# Patient Record
Sex: Male | Born: 1945
Health system: Southern US, Community
[De-identification: ages and names within clinical notes are randomized; demographics above are authoritative.]

## PROBLEM LIST (undated history)

## (undated) DIAGNOSIS — I6529 Occlusion and stenosis of unspecified carotid artery: Secondary | ICD-10-CM

## (undated) DIAGNOSIS — G2581 Restless legs syndrome: Secondary | ICD-10-CM

## (undated) DIAGNOSIS — E038 Other specified hypothyroidism: Secondary | ICD-10-CM

## (undated) DIAGNOSIS — C801 Malignant (primary) neoplasm, unspecified: Secondary | ICD-10-CM

## (undated) DIAGNOSIS — I10 Essential (primary) hypertension: Secondary | ICD-10-CM

## (undated) DIAGNOSIS — R5383 Other fatigue: Secondary | ICD-10-CM

## (undated) DIAGNOSIS — E782 Mixed hyperlipidemia: Secondary | ICD-10-CM

## (undated) DIAGNOSIS — I639 Cerebral infarction, unspecified: Secondary | ICD-10-CM

## (undated) DIAGNOSIS — C329 Malignant neoplasm of larynx, unspecified: Secondary | ICD-10-CM

## (undated) DIAGNOSIS — G458 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: Secondary | ICD-10-CM

## (undated) HISTORY — DX: Other fatigue: R53.83

## (undated) HISTORY — DX: Other specified hypothyroidism: E03.8

## (undated) HISTORY — PX: TONSILECTOMY/ADENOIDECTOMY WITH MYRINGOTOMY: SHX6125

## (undated) HISTORY — DX: Malignant (primary) neoplasm, unspecified: C80.1

## (undated) HISTORY — DX: Occlusion and stenosis of unspecified carotid artery: I65.29

## (undated) HISTORY — DX: Cerebral infarction, unspecified: I63.9

## (undated) HISTORY — DX: Restless legs syndrome: G25.81

## (undated) HISTORY — PX: THROAT SURGERY: SHX803

## (undated) HISTORY — DX: Essential (primary) hypertension: I10

## (undated) HISTORY — DX: Mixed hyperlipidemia: E78.2

## (undated) HISTORY — DX: Malignant neoplasm of larynx, unspecified: C32.9

## (undated) HISTORY — DX: 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: G45.8

## (undated) HISTORY — PX: LYMPHADENECTOMY: SHX15

---

## 2008-01-23 ENCOUNTER — Ambulatory Visit (HOSPITAL_COMMUNITY): Admission: RE | Admit: 2008-01-23 | Discharge: 2008-01-23 | Payer: Self-pay | Admitting: Radiation Oncology

## 2011-04-27 LAB — HM COLONOSCOPY

## 2012-09-05 ENCOUNTER — Other Ambulatory Visit: Payer: Self-pay

## 2012-09-05 DIAGNOSIS — I70219 Atherosclerosis of native arteries of extremities with intermittent claudication, unspecified extremity: Secondary | ICD-10-CM

## 2012-10-02 ENCOUNTER — Encounter: Payer: Self-pay | Admitting: Vascular Surgery

## 2012-10-03 ENCOUNTER — Encounter: Payer: Self-pay | Admitting: Vascular Surgery

## 2012-10-03 ENCOUNTER — Ambulatory Visit (INDEPENDENT_AMBULATORY_CARE_PROVIDER_SITE_OTHER): Payer: Medicare HMO | Admitting: Vascular Surgery

## 2012-10-03 VITALS — BP 134/83 | HR 71 | Resp 18 | Ht 65.0 in | Wt 187.3 lb

## 2012-10-03 DIAGNOSIS — I70219 Atherosclerosis of native arteries of extremities with intermittent claudication, unspecified extremity: Secondary | ICD-10-CM | POA: Insufficient documentation

## 2012-10-03 HISTORY — DX: Atherosclerosis of native arteries of extremities with intermittent claudication, unspecified extremity: I70.219

## 2012-10-03 NOTE — Progress Notes (Signed)
[facility]ist of [place]  Patient name: James Richards MRN: [id number] DOB: Feb 17, 1945 Sex: male  REASON FOR CONSULT: right calf claudication.  HPI: James Richards is a 67 y.o. male who was referred by Dr. Marina Goodell with right lower extremity claudication. This down had sponsored a program to try to get every 1 walking a mile. Once he began this he began experiencing pain in his right calf which was brought on by ambulation and relieved with rest. Prior to this he had not experienced any symptoms but his activity was fairly limited. He denies any symptoms in the left lower extremity. He denies right thigh are hip claudication. There are no other aggravating or alleviating factors.  I have reviewed his records from the referring physician. He was admitted approximately a month ago with a TIA associated with some slurred speech and right arm clumsiness. 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 He states that he had a carotid duplex that did not show any significant findings although I do not have that report.   Past Medical History  Diagnosis Date  . Carotid artery occlusion     throat  . Stroke    He denies any history of diabetes, previous history of myocardial infarction or history of congestive heart failure.  Family History  Problem Relation Age of Onset  . Other Father     amputation  . Cancer Sister   . Hypertension Brother    He denies any history of premature cardiovascular disease.  SOCIAL HISTORY: History  Substance Use Topics  . Smoking status: Former Smoker    Types: Cigarettes    Quit date: 02/15/2007  . Smokeless tobacco: Former Neurosurgeon    Types: Chew  . Alcohol Use: No   No Known Allergies  Current Outpatient Prescriptions  Medication Sig Dispense Refill  . amLODipine (NORVASC) 2.5 MG tablet Take 1 tablet by mouth daily.      Marland Kitchen aspirin 81 MG tablet Take 81 mg by mouth daily.      Marland Kitchen levothyroxine (SYNTHROID,  LEVOTHROID) 112 MCG tablet Take 1 tablet by mouth daily.      . minocycline (MINOCIN,DYNACIN) 50 MG capsule Take 1 capsule by mouth daily.      . Omega-3 Fatty Acids (FISH OIL) 1200 MG CPDR Take 1 capsule by mouth daily.      Marland Kitchen omeprazole (PRILOSEC) 40 MG capsule Take 1 capsule by mouth daily.       No current facility-administered medications for this visit.   REVIEW OF SYSTEMS: Arly.Keller ] denotes positive finding; [  ] denotes negative finding  CARDIOVASCULAR:  [ ]  chest pain   [ ]  chest pressure   [ ]  palpitations   [ ]  orthopnea   [ ]  dyspnea on exertion   Arly.Keller ] claudication- right calf.   [ ]  rest pain   [ ]  DVT   [ ]  phlebitis PULMONARY:   [ ]  productive cough   [ ]  asthma   [ ]  wheezing NEUROLOGIC:   [ ]  weakness  [ ]  paresthesias  [ ]  aphasia  [ ]  amaurosis  [ ]  dizziness HEMATOLOGIC:   [ ]  bleeding problems   [ ]  clotting disorders MUSCULOSKELETAL:  [ ]  joint pain   [ ]  joint swelling Arly.Keller ] leg swelling GASTROINTESTINAL: [ ]   blood in stool  [ ]   hematemesis GENITOURINARY:  [ ]   dysuria  [ ]   hematuria PSYCHIATRIC:  [ ]   history of major depression INTEGUMENTARY:  [ ]  rashes  [ ]  ulcers CONSTITUTIONAL:  [ ]  fever   [ ]  chills  PHYSICAL EXAM: Filed Vitals:   10/03/12 1019  BP: 134/83  Pulse: 71  Resp: 18  Height: 5\' 5"  (1.651 m)  Weight: 187 lb 4.8 oz (84.959 kg)  SpO2: 100%   Body mass index is 31.17 kg/(m^2). GENERAL: The patient is a well-nourished male, in no acute distress. The vital signs are documented above. CARDIOVASCULAR: There is a regular rate and rhythm. I do not detect carotid bruits. He has palpable femoral and popliteal pulses bilaterally. I cannot palpate pedal pulses. Both feet are warm and well-perfused. He has no significant lower extremity swelling. PULMONARY: There is good air exchange bilaterally without wheezing or rales. ABDOMEN: Soft and non-tender with normal pitched bowel sounds.  MUSCULOSKELETAL: There are no major deformities or cyanosis. NEUROLOGIC:  No focal weakness or paresthesias are detected. SKIN: There are no ulcers or rashes noted. PSYCHIATRIC: The patient has a normal affect.  DATA:  I have reviewed his arterial Doppler study which was done at [facility]. Resting ABIs were 97% on the right and 100% on the left. His right ABI dropped to 53% after exercise.  MEDICAL ISSUES:  Atherosclerotic peripheral vascular disease with intermittent claudication This patient has stable right lower extremity calf claudication. Fortunately he is not a smoker. I've encouraged him to stay as active as possible. His primary care doctor follows his blood pressure and cholesterol closely. He is on aspirin. We've discussed the importance of getting I structured walking program. I'll see him back in one year with follow up ABIs which I have ordered. He knows to call sooner if he has problems. I've explained that we would only consider arteriography of his symptoms progressed and became disabling or he developed rest pain or nonhealing ulcer.   Kris No S [facility] of [place] Beeper: [phone]

## 2012-10-03 NOTE — Addendum Note (Signed)
Addended by: Adria Dill L on: 10/03/2012 03:49 PM   Modules accepted: Orders

## 2012-10-03 NOTE — Assessment & Plan Note (Signed)
This patient has stable right lower extremity calf claudication. Fortunately he is not a smoker. I've encouraged him to stay as active as possible. His primary care doctor follows his blood pressure and cholesterol closely. He is on aspirin. We've discussed the importance of getting I structured walking program. I'll see him back in one year with follow up ABIs which I have ordered. He knows to call sooner if he has problems. I've explained that we would only consider arteriography of his symptoms progressed and became disabling or he developed rest pain or nonhealing ulcer.

## 2012-10-04 ENCOUNTER — Encounter: Payer: Self-pay | Admitting: Legal Medicine

## 2012-12-20 ENCOUNTER — Other Ambulatory Visit: Payer: Self-pay

## 2013-04-04 ENCOUNTER — Other Ambulatory Visit: Payer: Self-pay | Admitting: Vascular Surgery

## 2013-04-04 DIAGNOSIS — I70219 Atherosclerosis of native arteries of extremities with intermittent claudication, unspecified extremity: Secondary | ICD-10-CM

## 2013-10-08 ENCOUNTER — Encounter: Payer: Self-pay | Admitting: Family

## 2013-10-09 ENCOUNTER — Encounter: Payer: Self-pay | Admitting: Family

## 2013-10-09 ENCOUNTER — Ambulatory Visit (HOSPITAL_COMMUNITY)
Admission: RE | Admit: 2013-10-09 | Discharge: 2013-10-09 | Disposition: A | Discharge status: Home / Self Care | Payer: Medicare HMO | Source: Ambulatory Visit | Attending: Family | Admitting: Family

## 2013-10-09 ENCOUNTER — Ambulatory Visit (INDEPENDENT_AMBULATORY_CARE_PROVIDER_SITE_OTHER): Payer: Commercial Managed Care - HMO | Admitting: Family

## 2013-10-09 VITALS — BP 131/91 | HR 65 | Resp 16 | Ht 65.0 in | Wt 188.0 lb

## 2013-10-09 DIAGNOSIS — I70219 Atherosclerosis of native arteries of extremities with intermittent claudication, unspecified extremity: Secondary | ICD-10-CM

## 2013-10-09 DIAGNOSIS — I10 Essential (primary) hypertension: Secondary | ICD-10-CM | POA: Insufficient documentation

## 2013-10-09 HISTORY — DX: Atherosclerosis of native arteries of extremities with intermittent claudication, unspecified extremity: I70.219

## 2013-10-09 NOTE — Progress Notes (Signed)
VASCULAR & VEIN SPECIALISTS OF [place] HISTORY AND PHYSICAL -PAD  History of Present Illness James Richards is a 68 y.o. male patient of Dr. Scot Dock with stable right lower extremity calf claudication. He returns today for ABI's and evaluation. After walking about 1/2 mile he develops right calf pain, resolves with rest, denies non healing wounds. He had a stroke at age 33, and 82 TIA's since then, 2 of which he had no symptoms. His stroke and TIA manifested as receptive and expressive aphasia. He had right hemiparesis for about a day. He denies any monocular loss of vision. He states that his stroke was a cerebral bleed.  He states he had a Duplex of his neck about 10 years ago at [facility] in [place], pt states results were normal, but that his left carotid pulse is difficult to find. Lymph nodes in neck removed in 2009 for tongue cancer, then he had radiation to the right side of his neck, also had chemotherapy. Pt has not had previous vascular intervention of legs . He has a hx of PE during the chemo and radiation treatment, treated with Lovenox for 6 months.  The patient denies New Medical or Surgical History.  Pt Diabetic: No Pt smoker: former smoker, quit in 2009  Pt meds include: Statin :No, states his cholesterol is OK. ASA: Yes Other anticoagulants/antiplatelets: no  Past Medical History  Diagnosis Date  . Carotid artery occlusion     throat  . Stroke     Social History History  Substance Use Topics  . Smoking status: Former Smoker    Types: Cigarettes    Quit date: 02/15/2007  . Smokeless tobacco: Former Systems developer    Types: Chew  . Alcohol Use: No    Family History Family History  Problem Relation Age of Onset  . Other Father     amputation  . Cancer Sister   . Hypertension Brother     Past Surgical History  Procedure Laterality Date  . Lymphadenectomy      No Known Allergies  Current Outpatient Prescriptions  Medication Sig Dispense Refill   . amLODipine (NORVASC) 2.5 MG tablet Take 1 tablet by mouth daily.      Marland Kitchen aspirin 81 MG tablet Take 81 mg by mouth daily.      Marland Kitchen levothyroxine (SYNTHROID, LEVOTHROID) 112 MCG tablet Take 1 tablet by mouth daily.      . minocycline (MINOCIN,DYNACIN) 50 MG capsule Take 1 capsule by mouth daily.      . Omega-3 Fatty Acids (FISH OIL) 1200 MG CPDR Take 1 capsule by mouth daily.      Marland Kitchen omeprazole (PRILOSEC) 40 MG capsule Take 1 capsule by mouth daily.       No current facility-administered medications for this visit.    ROS: See HPI for pertinent positives and negatives.   Physical Examination  Filed Vitals:   10/09/13 1123  BP: 131/91  Pulse: 65  Resp: 16  Height: 5\' 5"  (1.651 m)  Weight: 188 lb (85.276 kg)  SpO2: 99%   Body mass index is 31.28 kg/(m^2).  General: A&O x 3, WDWN, obese male. Gait: normal Eyes: PERRLA. Pulmonary: CTAB, without wheezes , rales or rhonchi. Cardiac: regular Rythm , without detected murmur.         Carotid Bruits Right Left   Negative Negative  Aorta is not palpable. Radial pulses: are 2+ palpable and =  VASCULAR EXAM: Extremities without ischemic changes  without Gangrene; without open wounds.                                                                                                          LE Pulses Right Left       FEMORAL  1+ palpable  2+ palpable        POPLITEAL  1+ palpable   not palpable       POSTERIOR TIBIAL  not palpable   1+ palpable        DORSALIS PEDIS      ANTERIOR TIBIAL not palpable  1+ palpable    Abdomen: soft, NT, no masses. Skin: no rashes, no ulcers noted. Musculoskeletal: no muscle wasting or atrophy.  Neurologic: A&O X 3; Appropriate Affect ; SENSATION: normal; MOTOR FUNCTION:  moving all extremities equally, motor strength 5/5 throughout. Speech is fluent/normal. CN 2-12 intact except for tongue deviation to the right.    Non-Invasive Vascular Imaging: DATE: 10/09/2013 ABI:   RIGHT 0.96, Waveforms: bi and monophasic;  LEFT 1.14, Waveforms: triphasic Normal TBI's   ASSESSMENT: James Richards is a 68 y.o. male who presents with mild right calf claudication. After walking about 1/2 mile he develops right calf pain, resolves with rest, denies non healing wounds. He has a history of hemorrhagic stroke and 3 TIA's, no carotid Duplex done for at least 10 years per pt, no results on file, will obtain on his return in 1 year. ABI's in both legs are normal.  PLAN:  I discussed in depth with the patient the nature of atherosclerosis, and emphasized the importance of maximal medical management including strict control of blood pressure, blood glucose, and lipid levels, obtaining regular exercise, and continued cessation of smoking.  The patient is aware that without maximal medical management the underlying atherosclerotic disease process will progress, limiting the benefit of any interventions.  Based on the patient's vascular studies and examination, pt will return to clinic in 1 year for ABI's and carotid Duplex.  The patient was given information about PAD including signs, symptoms, treatment, what symptoms should prompt the patient to seek immediate medical care, and risk reduction measures to take.  Clemon Chambers, RN, MSN, FNP-C [facility]ascular [facility] Vein Specialists of [facility] Phone: [phone]  Clinic MD: Scot Dock  10/09/2013 11:10 AM

## 2013-10-09 NOTE — Patient Instructions (Signed)

## 2014-04-02 DIAGNOSIS — G43909 Migraine, unspecified, not intractable, without status migrainosus: Secondary | ICD-10-CM

## 2014-04-02 HISTORY — DX: Migraine, unspecified, not intractable, without status migrainosus: G43.909

## 2014-10-15 ENCOUNTER — Ambulatory Visit: Payer: Commercial Managed Care - HMO | Admitting: Family

## 2014-10-15 ENCOUNTER — Encounter (HOSPITAL_COMMUNITY): Payer: Commercial Managed Care - HMO

## 2014-11-11 DIAGNOSIS — G451 Carotid artery syndrome (hemispheric): Secondary | ICD-10-CM

## 2014-11-11 HISTORY — DX: Carotid artery syndrome (hemispheric): G45.1

## 2014-11-17 ENCOUNTER — Encounter: Payer: Self-pay | Admitting: Vascular Surgery

## 2014-11-19 ENCOUNTER — Ambulatory Visit (HOSPITAL_COMMUNITY)
Admission: RE | Admit: 2014-11-19 | Discharge: 2014-11-19 | Disposition: A | Discharge status: Home / Self Care | Payer: Commercial Managed Care - HMO | Source: Ambulatory Visit | Attending: Vascular Surgery | Admitting: Vascular Surgery

## 2014-11-19 ENCOUNTER — Other Ambulatory Visit: Payer: Self-pay

## 2014-11-19 ENCOUNTER — Ambulatory Visit (INDEPENDENT_AMBULATORY_CARE_PROVIDER_SITE_OTHER): Payer: Commercial Managed Care - HMO | Admitting: Vascular Surgery

## 2014-11-19 ENCOUNTER — Encounter: Payer: Self-pay | Admitting: Vascular Surgery

## 2014-11-19 VITALS — BP 127/81 | HR 71 | Ht 65.0 in | Wt 195.3 lb

## 2014-11-19 DIAGNOSIS — Z48812 Encounter for surgical aftercare following surgery on the circulatory system: Secondary | ICD-10-CM

## 2014-11-19 DIAGNOSIS — I6522 Occlusion and stenosis of left carotid artery: Secondary | ICD-10-CM | POA: Diagnosis not present

## 2014-11-19 DIAGNOSIS — Z8673 Personal history of transient ischemic attack (TIA), and cerebral infarction without residual deficits: Secondary | ICD-10-CM | POA: Insufficient documentation

## 2014-11-19 DIAGNOSIS — I6523 Occlusion and stenosis of bilateral carotid arteries: Secondary | ICD-10-CM | POA: Insufficient documentation

## 2014-11-19 NOTE — Progress Notes (Signed)
Vascular and Vein Specialist of [place]  Patient name: James Richards MRN: [id number] DOB: 07/22/1945 Sex: male  REASON FOR CONSULT: evaluate left carotid stenosis  HPI: James Richards is a 69 y.o. male, who is right handed, and who was referred for evaluation of a left carotid stenosis. I have reviewed his records that were sent with him. 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 He had follow up visits in March, May, August, and most recently September.   In my history, he states that in January he was having problems with left arm tingling and weakness. He also admits to episodes of transient loss of vision in the left eye which would last only briefly. This is been going on since January. His most recent episode was one week ago. He also admits to some episodes of expressive aphasia. He had been on aspirin but this was recently discontinued as he was started on Eliquis. He is not sure why he is on Eliquis.  Of note he is on Eliquis. He is also on aspirin and is on a statin.  Past Medical History  Diagnosis Date  . Carotid artery occlusion     throat  . Stroke [facility]) 1987 , 2014  . Cancer [facility]) Jun 30, 2007    Throat , Tongue   Family History  Problem Relation Age of Onset  . Other Father     amputation  . Deep vein thrombosis Father   . Heart disease Father     Atrial Fib.  . Cancer Sister   . Hypertension Brother    SOCIAL HISTORY: Social History   Social History  . Marital Status: Divorced    Spouse Name: N/A  . Number of Children: N/A  . Years of Education: N/A   Occupational History  . Not on file.   Social History Main Topics  . Smoking status: Former Smoker    Types: Cigarettes    Quit date: 02/15/2007  . Smokeless tobacco: Former Systems developer    Types: Chew  . Alcohol Use: No  . Drug Use: No  . Sexual Activity: Not on file   Other Topics Concern  . Not on file   Social History  Narrative   No Known Allergies Current Outpatient Prescriptions  Medication Sig Dispense Refill  . amLODipine (NORVASC) 2.5 MG tablet Take 1 tablet by mouth daily.    Marland Kitchen apixaban (ELIQUIS) 5 MG TABS tablet Take 5 mg by mouth.    Marland Kitchen atorvastatin (LIPITOR) 80 MG tablet Take 80 mg by mouth.    . levothyroxine (SYNTHROID, LEVOTHROID) 112 MCG tablet Take 1 tablet by mouth daily.    . minocycline (MINOCIN,DYNACIN) 50 MG capsule Take 1 capsule by mouth daily.    . Omega-3 Fatty Acids (FISH OIL) 1200 MG CPDR Take 1 capsule by mouth daily.    Marland Kitchen omeprazole (PRILOSEC) 40 MG capsule Take 1 capsule by mouth daily.    Marland Kitchen aspirin 81 MG tablet Take 81 mg by mouth daily.     No current facility-administered medications for this visit.   REVIEW OF SYSTEMS:  [X]  denotes positive finding, [ ]  denotes negative finding Cardiac  Comments:  Chest pain or chest pressure:    Shortness of breath upon exertion: X   Short of breath when lying flat:    Irregular heart rhythm:        Vascular    Pain in calf, thigh, or  hip brought on by ambulation:    Pain in feet at night that wakes you up from your sleep:     Blood clot in your veins:    Leg swelling:  X       Pulmonary    Oxygen at home:    Productive cough:     Wheezing:         Neurologic    Sudden weakness in arms or legs:     Sudden numbness in arms or legs:     Sudden onset of difficulty speaking or slurred speech:    Temporary loss of vision in one eye:  X   Problems with dizziness:  X       Gastrointestinal    Blood in stool:     Vomited blood:         Genitourinary    Burning when urinating:     Blood in urine:        Psychiatric    Major depression:         Hematologic    Bleeding problems:    Problems with blood clotting too easily:        Skin    Rashes or ulcers:        Constitutional    Fever or chills:      PHYSICAL EXAM: Filed Vitals:   11/19/14 1341 11/19/14 1343  BP: 125/89 127/81  Pulse: 71   Height: 5\' 5"   (1.651 m)   Weight: 195 lb 4.8 oz (88.587 kg)   SpO2: 100%    GENERAL: The patient is a well-nourished male, in no acute distress. The vital signs are documented above. CARDIAC: There is a regular rate and rhythm.  VASCULAR: I do not detect carotid bruits. PULMONARY: There is good air exchange bilaterally without wheezing or rales. ABDOMEN: Soft and non-tender with normal pitched bowel sounds.  MUSCULOSKELETAL: There are no major deformities or cyanosis. NEUROLOGIC: No focal weakness or paresthesias are detected. SKIN: There are no ulcers or rashes noted. PSYCHIATRIC: The patient has a normal affect.  DATA:  I have reviewed the records that were sent with him from [place] ON 11/10/2014: This shows a 75-80% stenosis in the proximal left internal carotid artery. This stenosis subserves a vascular territory affected by the cerebral infarcts on the previous MRI in January and could potentially contribute to the suspected watershed pattern of infarction. There was a less than 50% stenosis in the right carotid artery.  CAROTID DUPLEX SCAN ON 02/27/2014: This shows no evidence of carotid stenosis on the right. There was no significant stenosis noted on the left either. The right vertebral artery appeared to be occluded.  MRI OF THE HEAD ON 02/24/2014: This shows a left frontal, posterior frontal, and parietal acute cortical infarcts with suspected subacute left occipital cortical infarct. This was felt to be a possible watershed type insult.  CAROTID DUPLEX SCAN TODAY: I have independently interpreted the carotid duplex scan today. This shows a 60-79% left carotid stenosis by velocity criteria. However the patient has a high bifurcation and also a very tortuous artery suggesting that these velocities may be falsely elevated.  MEDICAL ISSUES:  SYMPTOMATICALLY CAROTID STENOSIS: The patient tells me that he has been having repeated episodes of amaurosis fugax in  the left eye. He has also had episodes of expressive aphasia. He denies any right-sided weakness. His CT scan suggests a 75-80% left carotid stenosis. His duplex can today shows a 60-79%  stenosis although these velocities may be falsely elevated because the internal carotid artery is tortuous. In addition the patient has a high bifurcation on the left. This reason I recommended that we proceed with a cerebral arteriogram to determine the severity of the stenosis and also to determine if the disease is surgically accessible. I have discussed the indications for cerebral arteriography. I've also discussed the potential complications of the procedure, including but not limited to: Bleeding, arterial injury, stroke (1%. Procedural risk), renal insufficiency, or other unpredictable medical problems. All the patient's questions were answered and they are agreeable to proceed. I'll make further recommendations pending these results. We will stop his Eliquis for 3 days prior to the procedure. I have also instructed him to begin taking aspirin 81 mg daily.   Deitra Mayo [facility] of [place]: [phone]

## 2014-11-24 ENCOUNTER — Inpatient Hospital Stay (HOSPITAL_COMMUNITY): Payer: Commercial Managed Care - HMO

## 2014-11-24 ENCOUNTER — Encounter (HOSPITAL_COMMUNITY)
Admission: AD | Disposition: A | Discharge status: Home / Self Care | Payer: Self-pay | Source: Ambulatory Visit | Attending: Vascular Surgery

## 2014-11-24 ENCOUNTER — Inpatient Hospital Stay (HOSPITAL_COMMUNITY)
Admission: AD | Admit: 2014-11-24 | Discharge: 2014-11-25 | DRG: 068 | Disposition: A | Discharge status: Home / Self Care | Payer: Commercial Managed Care - HMO | Source: Ambulatory Visit | Attending: Vascular Surgery | Admitting: Vascular Surgery

## 2014-11-24 ENCOUNTER — Encounter: Payer: Self-pay | Admitting: Vascular Surgery

## 2014-11-24 DIAGNOSIS — R4701 Aphasia: Secondary | ICD-10-CM | POA: Diagnosis not present

## 2014-11-24 DIAGNOSIS — E785 Hyperlipidemia, unspecified: Secondary | ICD-10-CM | POA: Diagnosis present

## 2014-11-24 DIAGNOSIS — Z85810 Personal history of malignant neoplasm of tongue: Secondary | ICD-10-CM | POA: Diagnosis not present

## 2014-11-24 DIAGNOSIS — Y848 Other medical procedures as the cause of abnormal reaction of the patient, or of later complication, without mention of misadventure at the time of the procedure: Secondary | ICD-10-CM | POA: Diagnosis not present

## 2014-11-24 DIAGNOSIS — I6529 Occlusion and stenosis of unspecified carotid artery: Secondary | ICD-10-CM

## 2014-11-24 DIAGNOSIS — I10 Essential (primary) hypertension: Secondary | ICD-10-CM | POA: Diagnosis present

## 2014-11-24 DIAGNOSIS — I6522 Occlusion and stenosis of left carotid artery: Secondary | ICD-10-CM | POA: Diagnosis present

## 2014-11-24 DIAGNOSIS — Z87891 Personal history of nicotine dependence: Secondary | ICD-10-CM | POA: Diagnosis not present

## 2014-11-24 DIAGNOSIS — I97821 Postprocedural cerebrovascular infarction during other surgery: Secondary | ICD-10-CM | POA: Diagnosis not present

## 2014-11-24 DIAGNOSIS — I6789 Other cerebrovascular disease: Secondary | ICD-10-CM | POA: Diagnosis not present

## 2014-11-24 DIAGNOSIS — G459 Transient cerebral ischemic attack, unspecified: Secondary | ICD-10-CM

## 2014-11-24 DIAGNOSIS — I639 Cerebral infarction, unspecified: Secondary | ICD-10-CM

## 2014-11-24 DIAGNOSIS — I63411 Cerebral infarction due to embolism of right middle cerebral artery: Secondary | ICD-10-CM | POA: Diagnosis not present

## 2014-11-24 HISTORY — PX: PERIPHERAL VASCULAR CATHETERIZATION: SHX172C

## 2014-11-24 HISTORY — DX: Occlusion and stenosis of unspecified carotid artery: I65.29

## 2014-11-24 HISTORY — DX: Transient cerebral ischemic attack, unspecified: G45.9

## 2014-11-24 LAB — POCT I-STAT, CHEM 8
BUN: 16 mg/dL (ref 6–20)
CALCIUM ION: 1.19 mmol/L (ref 1.13–1.30)
CHLORIDE: 103 mmol/L (ref 101–111)
Creatinine, Ser: 1 mg/dL (ref 0.61–1.24)
GLUCOSE: 101 mg/dL — AB (ref 65–99)
HCT: 35 % — ABNORMAL LOW (ref 39.0–52.0)
HEMOGLOBIN: 11.9 g/dL — AB (ref 13.0–17.0)
Potassium: 3.6 mmol/L (ref 3.5–5.1)
SODIUM: 141 mmol/L (ref 135–145)
TCO2: 24 mmol/L (ref 0–100)

## 2014-11-24 LAB — CBC
HEMATOCRIT: 35 % — AB (ref 39.0–52.0)
HEMOGLOBIN: 10.8 g/dL — AB (ref 13.0–17.0)
MCH: 26.5 pg (ref 26.0–34.0)
MCHC: 30.9 g/dL (ref 30.0–36.0)
MCV: 86 fL (ref 78.0–100.0)
Platelets: 290 10*3/uL (ref 150–400)
RBC: 4.07 MIL/uL — ABNORMAL LOW (ref 4.22–5.81)
RDW: 16.8 % — ABNORMAL HIGH (ref 11.5–15.5)
WBC: 5.6 10*3/uL (ref 4.0–10.5)

## 2014-11-24 LAB — GLUCOSE, CAPILLARY
Glucose-Capillary: 80 mg/dL (ref 65–99)
Glucose-Capillary: 94 mg/dL (ref 65–99)

## 2014-11-24 LAB — CREATININE, SERUM: CREATININE: 0.98 mg/dL (ref 0.61–1.24)

## 2014-11-24 SURGERY — AORTIC ARCH ANGIOGRAPHY
Anesthesia: LOCAL

## 2014-11-24 MED ORDER — PANTOPRAZOLE SODIUM 40 MG PO TBEC
80.0000 mg | DELAYED_RELEASE_TABLET | Freq: Every day | ORAL | Status: DC
  Administered 2014-11-24 – 2014-11-25 (×2): 80 mg via ORAL
  Filled 2014-11-24 (×2): qty 2

## 2014-11-24 MED ORDER — ATORVASTATIN CALCIUM 80 MG PO TABS
80.0000 mg | ORAL_TABLET | Freq: Every day | ORAL | Status: DC
  Administered 2014-11-24 – 2014-11-25 (×2): 80 mg via ORAL
  Filled 2014-11-24 (×2): qty 1

## 2014-11-24 MED ORDER — LABETALOL HCL 5 MG/ML IV SOLN
INTRAVENOUS | Status: AC
  Filled 2014-11-24: qty 4

## 2014-11-24 MED ORDER — LEVOTHYROXINE SODIUM 112 MCG PO TABS
112.0000 ug | ORAL_TABLET | Freq: Every day | ORAL | Status: DC
  Administered 2014-11-25: 112 ug via ORAL
  Filled 2014-11-24: qty 1

## 2014-11-24 MED ORDER — ONDANSETRON HCL 4 MG/2ML IJ SOLN: 4.0000 mg | Freq: Four times a day (QID) | INTRAMUSCULAR | Status: DC | PRN

## 2014-11-24 MED ORDER — LIDOCAINE HCL (PF) 1 % IJ SOLN
INTRAMUSCULAR | Status: AC
Start: 2014-11-24 — End: 2014-11-24
  Filled 2014-11-24: qty 30

## 2014-11-24 MED ORDER — LABETALOL HCL 5 MG/ML IV SOLN
INTRAVENOUS | Status: DC | PRN
  Administered 2014-11-24: 10 mg via INTRAVENOUS

## 2014-11-24 MED ORDER — HEPARIN (PORCINE) IN NACL 2-0.9 UNIT/ML-% IJ SOLN
INTRAMUSCULAR | Status: AC
  Filled 2014-11-24: qty 1000

## 2014-11-24 MED ORDER — LIDOCAINE HCL (PF) 1 % IJ SOLN
INTRAMUSCULAR | Status: DC | PRN
  Administered 2014-11-24: 08:00:00

## 2014-11-24 MED ORDER — ASPIRIN 325 MG PO TABS
325.0000 mg | ORAL_TABLET | Freq: Every day | ORAL | Status: DC
  Administered 2014-11-24 – 2014-11-25 (×2): 325 mg via ORAL
  Filled 2014-11-24 (×2): qty 1

## 2014-11-24 MED ORDER — SODIUM CHLORIDE 0.9 % IV SOLN: 1.0000 mL/kg/h | INTRAVENOUS | Status: AC

## 2014-11-24 MED ORDER — ENOXAPARIN SODIUM 40 MG/0.4ML ~~LOC~~ SOLN
40.0000 mg | SUBCUTANEOUS | Status: DC
  Administered 2014-11-25: 40 mg via SUBCUTANEOUS
  Filled 2014-11-24: qty 0.4

## 2014-11-24 MED ORDER — AMLODIPINE BESYLATE 5 MG PO TABS
2.5000 mg | ORAL_TABLET | Freq: Every day | ORAL | Status: DC
  Administered 2014-11-24 – 2014-11-25 (×2): 2.5 mg via ORAL
  Filled 2014-11-24 (×2): qty 1

## 2014-11-24 MED ORDER — ASPIRIN 81 MG PO TABS
325.0000 mg | ORAL_TABLET | Freq: Every day | ORAL | Status: DC
  Filled 2014-11-24: qty 4

## 2014-11-24 MED ORDER — OMEGA-3-ACID ETHYL ESTERS 1 G PO CAPS
1.0000 | ORAL_CAPSULE | Freq: Every day | ORAL | Status: DC
  Administered 2014-11-24 – 2014-11-25 (×2): 1 g via ORAL
  Filled 2014-11-24 (×3): qty 1

## 2014-11-24 MED ORDER — SODIUM CHLORIDE 0.9 % IV SOLN
INTRAVENOUS | Status: DC
  Administered 2014-11-24: 06:00:00 via INTRAVENOUS

## 2014-11-24 MED ORDER — ACETAMINOPHEN 325 MG PO TABS: 650.0000 mg | ORAL_TABLET | ORAL | Status: DC | PRN

## 2014-11-24 MED ORDER — MINOCYCLINE HCL 50 MG PO CAPS
50.0000 mg | ORAL_CAPSULE | Freq: Every day | ORAL | Status: DC
  Administered 2014-11-24 – 2014-11-25 (×2): 50 mg via ORAL
  Filled 2014-11-24 (×3): qty 1

## 2014-11-24 MED ORDER — VALPROATE SODIUM 500 MG/5ML IV SOLN: 1000.0000 mg | Freq: Once | INTRAVENOUS | Status: DC

## 2014-11-24 MED ORDER — ASPIRIN 81 MG PO TABS: 81.0000 mg | ORAL_TABLET | Freq: Every day | ORAL | Status: DC

## 2014-11-24 MED ORDER — LIDOCAINE HCL (PF) 1 % IJ SOLN
INTRAMUSCULAR | Status: DC | PRN
  Administered 2014-11-24: 5 mL via INTRADERMAL

## 2014-11-24 MED ORDER — ADULT MULTIVITAMIN W/MINERALS CH
1.0000 | ORAL_TABLET | Freq: Every day | ORAL | Status: DC
  Administered 2014-11-24 – 2014-11-25 (×2): 1 via ORAL
  Filled 2014-11-24 (×2): qty 1

## 2014-11-24 SURGICAL SUPPLY — 13 items
CATH ANGIO 5F BER2 100CM (CATHETERS) ×2 IMPLANT
CATH ANGIO 5F PIGTAIL 100CM (CATHETERS) ×2 IMPLANT
CATH HEADHUNTER H1 5F 100CM (CATHETERS) ×2 IMPLANT
COVER PRB 48X5XTLSCP FOLD TPE (BAG) ×1 IMPLANT
COVER PROBE 5X48 (BAG) ×1
KIT MICROINTRODUCER STIFF 5F (SHEATH) ×2 IMPLANT
KIT PV (KITS) ×2 IMPLANT
SHEATH PINNACLE 5F 10CM (SHEATH) ×2 IMPLANT
SYR MEDRAD MARK V 150ML (SYRINGE) ×2 IMPLANT
TRANSDUCER W/STOPCOCK (MISCELLANEOUS) ×2 IMPLANT
TRAY PV CATH (CUSTOM PROCEDURE TRAY) ×2 IMPLANT
WIRE HI TORQ VERSACORE-J 145CM (WIRE) ×2 IMPLANT
WIRE VERSACORE LOC 115CM (WIRE) ×2 IMPLANT

## 2014-11-24 NOTE — Progress Notes (Signed)
Patient ID: James Richards, male   DOB: Jan 21, 1946, 69 y.o.   MRN: [id number]   Asked by my partner Dr Scot Dock to review patient's xray studies for possible carotid stenting.  I have reviewed the patient's angiogram from today as well as his CT angio of the neck from several weeks ago.  By history the patient is having left brain events and in fact had another one today during his carotid angio.    The patient has takeoff of the left CCA very near or actually originating from the innominate at a very sharp right angle.  There is some tortuosity of the main portion of the left common carotid.  At the bifurcation there is a thick rind of probably soft plaque extending from the origin of the ICA over a few cm and this then thins out at which point there is at least a 70% stenosis and potentially even higher grade.  The ICA takes another 90 degree turn at this point with more normal artery distally at the artery enters the skull base.  Due to these 3 right angle turns I do not believe the patient is a candidate for carotid stenting from a femoral or right brachial approach.  Additionally the stenosis is adjacent to a right angle turn which would not allow enough distal landing zone for the stent to land and be free of kinking.  Pt may also be at high risk for cranial nerve injury and wound healing issues due to prior radiation/operation for tongue cancer.  Will leave final decision to Dr Scot Dock on consideration for open repair versus maximal medical management with dual antiplatelet and statin therapy.  Ruta Hinds, MD [facility] of [place] Office: [phone] Pager: [phone]

## 2014-11-24 NOTE — H&P (View-Only) (Signed)
[facility] [place]  Patient name: James Richards MRN: [id number] DOB: [birth year] Sex: male  REASON FOR CONSULT: evaluate left carotid stenosis  HPI: James Richards is a 69 y.o. male, who is right handed, and who was referred for evaluation of a left carotid stenosis. I have reviewed his records that were sent with him. 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 He had follow up visits in March, May, August, and most recently September.   In my history, he states that in January he was having problems with left arm tingling and weakness. He also admits to episodes of transient loss of vision in the left eye which would last only briefly. This is been going on since January. His most recent episode was one week ago. He also admits to some episodes of expressive aphasia. He had been on aspirin but this was recently discontinued as he was started on Eliquis. He is not sure why he is on Eliquis.  Of note he is on Eliquis. He is also on aspirin and is on a statin.  Past Medical History  Diagnosis Date  . Carotid artery occlusion     throat  . Stroke [facility]) 1987 , 2014  . Cancer [facility]) Jun 30, 2007    Throat , Tongue   Family History  Problem Relation Age of Onset  . Other Father     amputation  . Deep vein thrombosis Father   . Heart disease Father     Atrial Fib.  . Cancer Sister   . Hypertension Brother    SOCIAL HISTORY: Social History   Social History  . Marital Status: Divorced    Spouse Name: N/A  . Number of Children: N/A  . Years of Education: N/A   Occupational History  . Not on file.   Social History Main Topics  . Smoking status: Former Smoker    Types: Cigarettes    Quit date: 02/15/2007  . Smokeless tobacco: Former Systems developer    Types: Chew  . Alcohol Use: Richards  . Drug Use: Richards  . Sexual Activity: Not on file   Other Topics Concern  . Not on file   Social History  Narrative   Richards Known Allergies Current Outpatient Prescriptions  Medication Sig Dispense Refill  . amLODipine (NORVASC) 2.5 MG tablet Take 1 tablet by mouth daily.    Marland Kitchen apixaban (ELIQUIS) 5 MG TABS tablet Take 5 mg by mouth.    Marland Kitchen atorvastatin (LIPITOR) 80 MG tablet Take 80 mg by mouth.    . levothyroxine (SYNTHROID, LEVOTHROID) 112 MCG tablet Take 1 tablet by mouth daily.    . minocycline (MINOCIN,DYNACIN) 50 MG capsule Take 1 capsule by mouth daily.    . Omega-3 Fatty Acids (FISH OIL) 1200 MG CPDR Take 1 capsule by mouth daily.    Marland Kitchen omeprazole (PRILOSEC) 40 MG capsule Take 1 capsule by mouth daily.    Marland Kitchen aspirin 81 MG tablet Take 81 mg by mouth daily.     Richards current facility-administered medications for this visit.   REVIEW OF SYSTEMS:  [X]  denotes positive finding, [ ]  denotes negative finding Cardiac  Comments:  Chest pain or chest pressure:    Shortness of breath upon exertion: X   Short of breath when lying flat:    Irregular heart rhythm:        Vascular    Pain in calf, thigh, or  hip brought on by ambulation:    Pain in feet at night that wakes you up from your sleep:     Blood clot in your veins:    Leg swelling:  X       Pulmonary    Oxygen at home:    Productive cough:     Wheezing:         Neurologic    Sudden weakness in arms or legs:     Sudden numbness in arms or legs:     Sudden onset of difficulty speaking or slurred speech:    Temporary loss of vision in one eye:  X   Problems with dizziness:  X       Gastrointestinal    Blood in stool:     Vomited blood:         Genitourinary    Burning when urinating:     Blood in urine:        Psychiatric    Major depression:         Hematologic    Bleeding problems:    Problems with blood clotting too easily:        Skin    Rashes or ulcers:        Constitutional    Fever or chills:      PHYSICAL EXAM: Filed Vitals:   11/19/14 1341 11/19/14 1343  BP: 125/89 127/81  Pulse: 71   Height: 5\' 5"   (1.651 m)   Weight: 195 lb 4.8 oz (88.587 kg)   SpO2: 100%    GENERAL: The patient is a well-nourished male, in Richards acute distress. The vital signs are documented above. CARDIAC: There is a regular rate and rhythm.  VASCULAR: I do not detect carotid bruits. PULMONARY: There is good air exchange bilaterally without wheezing or rales. ABDOMEN: Soft and non-tender with normal pitched bowel sounds.  MUSCULOSKELETAL: There are Richards major deformities or cyanosis. NEUROLOGIC: Richards focal weakness or paresthesias are detected. SKIN: There are Richards ulcers or rashes noted. PSYCHIATRIC: The patient has a normal affect.  DATA:  I have reviewed the records that were sent with him from [place] ON 11/10/2014: This shows a 75-80% stenosis in the proximal left internal carotid artery. This stenosis subserves a vascular territory affected by the cerebral infarcts on the previous MRI in January and could potentially contribute to the suspected watershed pattern of infarction. There was a less than 50% stenosis in the right carotid artery.  CAROTID DUPLEX SCAN ON 02/27/2014: This shows Richards evidence of carotid stenosis on the right. There was Richards significant stenosis noted on the left either. The right vertebral artery appeared to be occluded.  MRI OF THE HEAD ON 02/24/2014: This shows a left frontal, posterior frontal, and parietal acute cortical infarcts with suspected subacute left occipital cortical infarct. This was felt to be a possible watershed type insult.  CAROTID DUPLEX SCAN TODAY: I have independently interpreted the carotid duplex scan today. This shows a 60-79% left carotid stenosis by velocity criteria. However the patient has a high bifurcation and also a very tortuous artery suggesting that these velocities may be falsely elevated.  MEDICAL ISSUES:  SYMPTOMATICALLY CAROTID STENOSIS: The patient tells me that he has been having repeated episodes of amaurosis fugax in  the left eye. He has also had episodes of expressive aphasia. He denies any right-sided weakness. His CT scan suggests a 75-80% left carotid stenosis. His duplex can today shows a 60-79%  stenosis although these velocities may be falsely elevated because the internal carotid artery is tortuous. In addition the patient has a high bifurcation on the left. This reason I recommended that we proceed with a cerebral arteriogram to determine the severity of the stenosis and also to determine if the disease is surgically accessible. I have discussed the indications for cerebral arteriography. I've also discussed the potential complications of the procedure, including but not limited to: Bleeding, arterial injury, stroke (1%. Procedural risk), renal insufficiency, or other unpredictable medical problems. All the patient's questions were answered and they are agreeable to proceed. I'll make further recommendations pending these results. We will stop his Eliquis for 3 days prior to the procedure. I have also instructed him to begin taking aspirin 81 mg daily.   Deitra Mayo [facility] [place]: [phone]

## 2014-11-24 NOTE — Code Documentation (Signed)
Bedside RN called this RN to complete patient's stroke swallow screen now that patient is off bedrest and able to sit up.  This RN to the bedside.  Patient in chair.  SSS completed and patient passed, see doc flowsheets for details.  Bedside RN made aware of results and to place diet order if appropriate.

## 2014-11-24 NOTE — Consult Note (Signed)
Stroke Consult    Chief Complaint: expressive aphasia  HPI: James Richards is an 69 y.o. male hx of carotid stenosis, prior CVA presenting for scheduled diagnostic angiography with suspected left carotid stenosis. During procedure, noted to acutely develop expressive>receptive aphasia. LSW 0800 on 10/10. Symptoms persisted so code stroke activated. Symptoms gradually improved during evaluation. Per wife he is currently back to his baseline. She reports that this episode is similar to multiple recurrent events he has been having since January.   Has a remote history of a CVA in which he had speech difficulties and weakness. She reports minimal to no residual deficits from that stroke. Was briefly on Eliquis for unclear reasons, currently taking ASA 81mg  prior to admission.   Date last known well: 11/24/2014 Time last known well: 0800 tPA Given: no, symptoms resolved, back to baseline Modified Rankin: Rankin Score=0  Past Medical History  Diagnosis Date  . Carotid artery occlusion     throat  . Stroke [facility]) 1987 , 2014  . Cancer [facility]) Jun 30, 2007    Throat , Tongue    Past Surgical History  Procedure Laterality Date  . Lymphadenectomy      Family History  Problem Relation Age of Onset  . Other Father     amputation  . Deep vein thrombosis Father   . Heart disease Father     Atrial Fib.  . Cancer Sister   . Hypertension Brother    Social History:  reports that he quit smoking about 7 years ago. His smoking use included Cigarettes. He has quit using smokeless tobacco. His smokeless tobacco use included Chew. He reports that he does not drink alcohol or use illicit drugs.  Allergies: No Known Allergies  Medications Prior to Admission  Medication Sig Dispense Refill  . amLODipine (NORVASC) 2.5 MG tablet Take 1 tablet by mouth daily.    Marland Kitchen apixaban (ELIQUIS) 5 MG TABS tablet Take 5 mg by mouth 2 (two) times daily.     Marland Kitchen aspirin 81 MG tablet Take 81 mg by mouth daily.    Marland Kitchen  atorvastatin (LIPITOR) 80 MG tablet Take 80 mg by mouth daily at 6 PM.     . levothyroxine (SYNTHROID, LEVOTHROID) 112 MCG tablet Take 1 tablet by mouth daily.    . minocycline (MINOCIN,DYNACIN) 50 MG capsule Take 1 capsule by mouth daily.    . Multiple Vitamin (MULTIVITAMIN WITH MINERALS) TABS tablet Take 1 tablet by mouth daily.    . Omega-3 Fatty Acids (FISH OIL) 1200 MG CPDR Take 1 capsule by mouth daily.    Marland Kitchen omeprazole (PRILOSEC) 40 MG capsule Take 1 capsule by mouth daily.      ROS: Out of a complete 14 system review, the patient complains of only the following symptoms, and all other reviewed systems are negative. +speech difficulties  Physical Examination: Filed Vitals:   11/24/14 0911  BP: 149/78  Pulse: 69  Temp:   Resp: 20   Physical Exam  Constitutional: He appears well-developed and well-nourished.  Psych: Affect appropriate to situation Eyes: No scleral injection HENT: No OP obstrucion Head: Normocephalic.  Cardiovascular: Normal rate and regular rhythm.  Respiratory: Effort normal and breath sounds normal.  GI: Soft. Bowel sounds are normal. No distension. There is no tenderness.  Skin: WDI  Neurologic Examination: Mental Status: Alert, oriented, thought content appropriate.  Speech overall fluent with minimal word finding difficulty. Able to follow 3 step commands without difficulty. Cranial Nerves: II: funduscopic exam wnl bilaterally, visual fields  grossly normal, pupils equal, round, reactive to light and accommodation III,IV, VI: ptosis not present, extra-ocular motions intact bilaterally V,VII: smile symmetric, facial light touch sensation normal bilaterally VIII: hearing normal bilaterally IX,X: gag reflex present XI: trapezius strength/neck flexion strength normal bilaterally XII: tongue strength normal  Motor: Right : Upper extremity    Left:     Upper extremity 5/5 deltoid       5/5 deltoid 5/5 biceps      5/5 biceps  5/5 triceps      5/5  triceps 5/5 hand grip      5/5 hand grip  Lower extremity     Lower extremity 5/5 hip flexor      5/5 hip flexor 5/5 quadricep      5/5 quadriceps  5/5 hamstrings     5/5 hamstrings 5/5 plantar flexion       5/5 plantar flexion 5/5 plantar extension     5/5 plantar extension Tone and bulk:normal tone throughout; no atrophy noted Sensory: Pinprick and light touch intact throughout, bilaterally Deep Tendon Reflexes: 2+ and symmetric throughout Plantars: Right: downgoing   Left: downgoing Cerebellar: normal finger-to-nose, and normal heel-to-shin test Gait: deferred  Laboratory Studies:   Basic Metabolic Panel:  Recent Labs Lab 11/24/14 0619  NA 141  K 3.6  CL 103  GLUCOSE 101*  BUN 16  CREATININE 1.00    Liver Function Tests: No results for input(s): AST, ALT, ALKPHOS, BILITOT, PROT, ALBUMIN in the last 168 hours. No results for input(s): LIPASE, AMYLASE in the last 168 hours. No results for input(s): AMMONIA in the last 168 hours.  CBC:  Recent Labs Lab 11/24/14 0619  HGB 11.9*  HCT 35.0*    Cardiac Enzymes: No results for input(s): CKTOTAL, CKMB, CKMBINDEX, TROPONINI in the last 168 hours.  BNP: Invalid input(s): POCBNP  CBG:  Recent Labs Lab 11/24/14 0822  GLUCAP 80    Microbiology: No results found for this or any previous visit.  Coagulation Studies: No results for input(s): LABPROT, INR in the last 72 hours.  Urinalysis: No results for input(s): COLORURINE, LABSPEC, PHURINE, GLUCOSEU, HGBUR, BILIRUBINUR, KETONESUR, PROTEINUR, UROBILINOGEN, NITRITE, LEUKOCYTESUR in the last 168 hours.  Invalid input(s): APPERANCEUR  Lipid Panel:  No results found for: CHOL, TRIG, HDL, CHOLHDL, VLDL, LDLCALC  HgbA1C: No results found for: HGBA1C  Urine Drug Screen:  No results found for: LABOPIA, COCAINSCRNUR, LABBENZ, AMPHETMU, THCU, LABBARB  Alcohol Level: No results for input(s): ETH in the last 168 hours.  Other results: EKG: NSR on  tele  Imaging: Ct Head Wo Contrast  11/24/2014   CLINICAL DATA:  Became disoriented during cardiac catheterization.  EXAM: CT HEAD WITHOUT CONTRAST  TECHNIQUE: Contiguous axial images were obtained from the base of the skull through the vertex without intravenous contrast.  COMPARISON:  None.  FINDINGS: Low-density noted within the left temporal lobe and left frontal lobe compatible with infarct, age indeterminate. Favor subacute to chronic. No hemorrhage. No hydrocephalus or mass lesion. No midline shift.  Visualized paranasal sinuses and mastoids clear. Orbital soft tissues unremarkable.  IMPRESSION: Age-indeterminate left MCA infarct.  Critical Value/emergent results were called by telephone at the time of interpretation on 11/24/2014 at 8:56 am to Dr. Jim Like , who verbally acknowledged these results.   Electronically Signed   By: Rolm Baptise M.D.   On: 11/24/2014 08:56    Assessment: 69 y.o. male hx of prior CVA, left carotid stenosis admitted for scheduled diagnostic angiography for suspected left carotid artery stenosis.  During procedure acutely developed aphasia. Symptoms mainly resolved by time of evaluation. CT head shows old left MCA infarct.   Case discussed with Dr Scot Dock. Will admit for observation overnight for possible TIA/CVA.   -check MRI brain -increase ASA to 325mg  daily. May consider dual antiplatelet therapy, will defer to stroke team pending input from [facility] -continue lipitor 80mg  nightly -case to be reviewed with Dr [name] Alar for possible carotid stent -stroke team to follow in the morning.   Jim Like, DO [facility] [phone]  If 7pm- 7am, please page neurology on call as listed in [place]. 11/24/2014, 9:38 AM

## 2014-11-24 NOTE — Interval H&P Note (Signed)
History and Physical Interval Note:  11/24/2014 7:31 AM  James Richards  has presented today for surgery, with the diagnosis of left carotid artery stenosis  The various methods of treatment have been discussed with the patient and family. After consideration of risks, benefits and other options for treatment, the patient has consented to  Procedure(s): Aortic Arch Angiography (N/A) Cerebral Angiography (N/A) as a surgical intervention .  The patient's history has been reviewed, patient examined, no change in status, stable for surgery.  I have reviewed the patient's chart and labs.  Questions were answered to the patient's satisfaction.     Deitra Mayo

## 2014-11-24 NOTE — Progress Notes (Signed)
VASCULAR SURGERY  His expressive aphasia has resolved. Appreciate Neuro's help.  Will increase his ASA to 325 mg po daily and continue statin. He has a very high bifurcation and a moderate mid ICA stenosis. ICA is very tortuous. I do not think that the Left carotid disease is surgically accessible. I have asked Dr. [name] Alar to review his angio, to see if he could be considered for a carotid stent, but given the tortuosity, I suspect that this may not be technically possible.  Plan to keep overnight for observation and D/C in AM if no new symptoms.  Deitra Mayo, MD, [place] [phone] Office: [phone]

## 2014-11-24 NOTE — Op Note (Signed)
PATIENT: James Richards   MRN: [id number] DOB: 1945-07-08    DATE OF PROCEDURE: 11/24/2014  INDICATIONS: James Richards is a 69 y.o. male who has been having repeated episodes of expressive aphasia and also amaurosis fugax of the left eye. A carotid duplex scan on 02/27/2014 showed no significant left carotid stenosis. A CT angiogram of the neck on 11/10/2014 showed a 75-80% proximal left internal carotid artery stenosis. A carotid duplex scan on 11/19/2014 showed a 60-79% left carotid stenosis. In addition, duplex at that time showed that the patient had a high bifurcation of the carotid artery on the left and also severe tortuosity of the internal carotid artery. This reason, cerebral arteriography was recommended in order to determine if his disease was surgically accessible as it appeared to be symptomatic.  PROCEDURE:  1. Ultrasound-guided access to the right common femoral artery 2. Arch aortogram 3. Selective catheterization of the right common carotid artery (second-order catheterization) 4. Selective catheterization of the left common carotid artery (second-order catheterization)  SURGEON: Judeth Cornfield. Scot Dock, MD, FACS  ANESTHESIA: local   EBL: minimal  TECHNIQUE: The patient was taken to the peripheral vascular lab. He was not sedated so that he could be assessed neurologically during the procedure. Both groins were prepped and draped in the usual sterile fashion. After the skin was anesthetized with 1% lidocaine, under ultrasound guidance, the right common femoral artery was cannulated with a micropuncture needle and a micropuncture sheath introduced over the wire. This was exchanged for a 5 French sheath over a versa core wire. A long pigtail catheter was positioned in the ascending aortic arch. Using a 40 LAO projection arch aortogram was obtained. He had significant tortuosity of the bilateral common carotid arteries and appeared to have a bovine arch. The pigtail catheter was  exchanged for an H1 catheter which was positioned into the innominate artery. I was able to the advance the wire into the right common carotid artery and then the H1 catheter was advanced over the wire. Selective right common carotid arteriogram was obtained with both intracranial and extracranial views. The intracranial views will be interpreted separately by the neuroradiologist. I then retracted the H1 catheter but did not have adequate angle to cannulate the left common carotid artery. I therefore exchanged this catheter for a Berenstein 2 catheter. I was able to cannulate the proximal left common carotid artery but only advance the wire partly into the common carotid artery. I was unable to advance the catheter over the wire nor was I able to advance the wire much further into the common carotid artery. During this time the patient seemed to be in some pain in talking with him became clear that he was having some issues with expressive aphasia. At that time he had no motor deficit and had good strength in his upper extremities and lower extremities bilaterally. A code stroke was called. The wire and catheter were removed. The sheath was removed and pressure held for hemostasis.   FINDINGS:  1. The aortic arch is widely patent with no significant atherosclerotic disease. 2. The patient has a bovine arch. There did not appear to be any significant disease in the innominate artery, right common carotid artery, right subclavian artery. 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 3. On the left side, the common carotid artery is widely patent. The carotid bifurcation is very high with sharp angulation of the internal carotid artery. There appears  to be some stenosis of approximately 60% in the mid segment of the internal carotid artery. Selective injection of the left common carotid artery was not performed so the visualization is somewhat  limited.  CLINICAL NOTE: This patient has been having repeated episodes of amaurosis fugax in the left eye and also expressive aphasia. He developed an episode of expressive aphasia during the procedure. Neurology is evaluating and he is having a stat CT scan. Given the high bifurcation I do not think the disease in his mid segment of his internal carotid artery is surgically accessible. Because of the extreme tortuosity of the internal carotid artery carotid stenting may also be technically not possible although I think this should be a consideration. He is on aspirin. He is on a statin. He was on Eliquis but was not sure why and this is been discontinued for now.  Deitra Mayo, MD, FACS [facility]  DATE OF DICTATION:   11/24/2014

## 2014-11-24 NOTE — Care Management Note (Signed)
Case Management Note  Patient Details  Name: James Richards MRN: [id number] Date of Birth: 1945-12-25  Subjective/Objective:        Adm post procedure w episode of aphasia in procedure            Action/Plan:lives w fam, pcp dr Cassell Clement   Expected Discharge Date:                  Expected Discharge Plan:     In-House Referral:     Discharge planning Services     Post Acute Care Choice:    Choice offered to:     DME Arranged:    DME Agency:     HH Arranged:    [place] Agency:     Status of Service:     Medicare Important Message Given:    Date Medicare IM Given:    Medicare IM give by:    Date Additional Medicare IM Given:    Additional Medicare Important Message give by:     If discussed at [place] of Stay Meetings, dates discussed:    Additional Comments: ur review done  Lacretia Leigh, RN 11/24/2014, 11:45 AM

## 2014-11-24 NOTE — Progress Notes (Signed)
Neuro assessment unchanged.

## 2014-11-24 NOTE — Progress Notes (Signed)
Speech is clear and appropriate; smile symmetrical, tongue midline; upper and lower extremities equal bilaterally in strength; PERRL; Dr. Janann Colonel at bedside w/wife talking w/patient.

## 2014-11-24 NOTE — Code Documentation (Signed)
69yo male to [facility] today for diagnostic angiogram.  During the procedure staff patient reported difficulty hearing then staff noticed that the patient stopped responding.  Code stroke activated.  Stroke team to cath lab.  Staff holding pressure on groin site.  Per Dr. Scot Dock, patient has been experiencing multiple TIAs with symptoms of expressive aphasia, left arm weakness and left amaurosis fugax.  Patient with studies showing inconsistent degrees of left carotid stenosis, therefore, patient for angiogram today.  Patient to radiology for CT with cath lab RN and stroke team.  Patient tolerated well and transported back to cath lab holding.  NIHSS 1, see documentation for details and code stroke times.  Patient very hard-of-hearing but able to express himself and follow commands.  Patient with fluent speech and intact naming.  No focal weakness noted.  Patient with occasional errors (wrong age) but corrects himself.  Dr. Janann Colonel at the bedside - no acute stroke treatment at this time.  Patient with h/o left MCA infarct.  Patient's wife to the bedside and reports patient to be at his baseline.  Of note, patient was on Eliquis, but this was recently stopped.  Patient to be admitted.  Bedside handoff with Neoma Laming, RN.

## 2014-11-25 ENCOUNTER — Ambulatory Visit (HOSPITAL_COMMUNITY)
Admission: RE | Admit: 2014-11-25 | Payer: Commercial Managed Care - HMO | Source: Ambulatory Visit | Admitting: Vascular Surgery

## 2014-11-25 ENCOUNTER — Encounter (HOSPITAL_COMMUNITY): Payer: Self-pay | Admitting: Vascular Surgery

## 2014-11-25 ENCOUNTER — Inpatient Hospital Stay (HOSPITAL_COMMUNITY): Payer: Commercial Managed Care - HMO

## 2014-11-25 DIAGNOSIS — I10 Essential (primary) hypertension: Secondary | ICD-10-CM

## 2014-11-25 DIAGNOSIS — I6522 Occlusion and stenosis of left carotid artery: Principal | ICD-10-CM

## 2014-11-25 DIAGNOSIS — E785 Hyperlipidemia, unspecified: Secondary | ICD-10-CM

## 2014-11-25 DIAGNOSIS — I63411 Cerebral infarction due to embolism of right middle cerebral artery: Secondary | ICD-10-CM

## 2014-11-25 DIAGNOSIS — I6789 Other cerebrovascular disease: Secondary | ICD-10-CM

## 2014-11-25 LAB — CBC
HCT: 35.1 % — ABNORMAL LOW (ref 39.0–52.0)
Hemoglobin: 11 g/dL — ABNORMAL LOW (ref 13.0–17.0)
MCH: 26.6 pg (ref 26.0–34.0)
MCHC: 31.3 g/dL (ref 30.0–36.0)
MCV: 85 fL (ref 78.0–100.0)
Platelets: 300 10*3/uL (ref 150–400)
RBC: 4.13 MIL/uL — ABNORMAL LOW (ref 4.22–5.81)
RDW: 16.6 % — ABNORMAL HIGH (ref 11.5–15.5)
WBC: 6.9 10*3/uL (ref 4.0–10.5)

## 2014-11-25 LAB — BASIC METABOLIC PANEL
Anion gap: 10 (ref 5–15)
BUN: 11 mg/dL (ref 6–20)
CO2: 27 mmol/L (ref 22–32)
Calcium: 9.6 mg/dL (ref 8.9–10.3)
Chloride: 101 mmol/L (ref 101–111)
Creatinine, Ser: 0.9 mg/dL (ref 0.61–1.24)
GFR calc Af Amer: >60 mL/min (ref >60)
GFR calc non Af Amer: >60 mL/min (ref >60)
Glucose, Bld: 105 mg/dL — ABNORMAL HIGH (ref 65–99)
Potassium: 3.7 mmol/L (ref 3.5–5.1)
Sodium: 138 mmol/L (ref 135–145)

## 2014-11-25 LAB — MRSA PCR SCREENING: MRSA BY PCR: NEGATIVE

## 2014-11-25 LAB — VITAMIN B12: Vitamin B-12: 651 pg/mL (ref 180–914)

## 2014-11-25 LAB — LIPID PANEL
CHOL/HDL RATIO: 2.6 ratio
CHOLESTEROL: 92 mg/dL (ref 0–200)
HDL: 36 mg/dL — AB (ref >40)
LDL Cholesterol: 44 mg/dL (ref 0–99)
TRIGLYCERIDES: 58 mg/dL (ref <150)
VLDL: 12 mg/dL (ref 0–40)

## 2014-11-25 LAB — TSH: TSH: 0.418 u[IU]/mL (ref 0.350–4.500)

## 2014-11-25 LAB — T4, FREE: Free T4: 1.02 ng/dL (ref 0.61–1.12)

## 2014-11-25 MED ORDER — CLOPIDOGREL BISULFATE 75 MG PO TABS: 75.0000 mg | ORAL_TABLET | Freq: Every day | ORAL | Status: DC

## 2014-11-25 MED ORDER — STROKE: EARLY STAGES OF RECOVERY BOOK
Freq: Once | Status: AC
  Administered 2014-11-25: 10:00:00
  Filled 2014-11-25: qty 1

## 2014-11-25 MED ORDER — ASPIRIN 325 MG PO TABS: 325.0000 mg | ORAL_TABLET | Freq: Every day | ORAL | Status: DC

## 2014-11-25 NOTE — Discharge Instructions (Signed)
STROKE/TIA DISCHARGE INSTRUCTIONS SMOKING Cigarette smoking nearly doubles your risk of having a stroke & is the single most alterable risk factor  If you smoke or have smoked in the last 12 months, you are advised to quit smoking for your health.  Most of the excess cardiovascular risk related to smoking disappears within a year of stopping.  Ask you doctor about anti-smoking medications  [place] Quit Line: 1-800-QUIT NOW  Free Smoking Cessation Classes [phone]  CHOLESTEROL Know your levels; limit fat & cholesterol in your diet  Lipid Panel     Component Value Date/Time   CHOL 92 11/25/2014 0715   TRIG 58 11/25/2014 0715   HDL 36* 11/25/2014 0715   CHOLHDL 2.6 11/25/2014 0715   VLDL 12 11/25/2014 0715   LDLCALC 44 11/25/2014 0715      Many patients benefit from treatment even if their cholesterol is at goal.  Goal: Total Cholesterol (CHOL) less than 160  Goal:  Triglycerides (TRIG) less than 150  Goal:  HDL greater than 40  Goal:  LDL (LDLCALC) less than 100   BLOOD PRESSURE American Stroke Association blood pressure target is less that 120/80 mm/Hg  Your discharge blood pressure is:  BP: (!) 152/79 mmHg  Monitor your blood pressure  Limit your salt and alcohol intake  Many individuals will require more than one medication for high blood pressure  DIABETES (A1c is a blood sugar average for last 3 months) Goal HGBA1c is under 7% (HBGA1c is blood sugar average for last 3 months)  Diabetes: No known diagnosis of diabetes    No results found for: HGBA1C   Your HGBA1c can be lowered with medications, healthy diet, and exercise.  Check your blood sugar as directed by your physician  Call your physician if you experience unexplained or low blood sugars.  PHYSICAL ACTIVITY/REHABILITATION Goal is 30 minutes at least 4 days per week  Activity: No restrictions. Therapies:  Return to work:   Activity decreases your risk of heart attack and stroke and makes your heart  stronger.  It helps control your weight and blood pressure; helps you relax and can improve your mood.  Participate in a regular exercise program.  Talk with your doctor about the best form of exercise for you (dancing, walking, swimming, cycling).  DIET/WEIGHT Goal is to maintain a healthy weight  Your discharge diet is: Diet Heart Room service appropriate?: Yes; Fluid consistency:: Thin s Your height is:  Height: 5\' 5"  (165.1 cm) Your current weight is: Weight: 88.451 kg (195 lb) Your Body Mass Index (BMI) is:  BMI (Calculated): 32.5  Following the type of diet specifically designed for you will help prevent another stroke.  Your goal weight range is:  124-158 lbs  Your goal Body Mass Index (BMI) is 19-24.  Healthy food habits can help reduce 3 risk factors for stroke:  High cholesterol, hypertension, and excess weight.  RESOURCES Stroke/Support Group:  Call [phone]   STROKE EDUCATION PROVIDED/REVIEWED AND GIVEN TO PATIENT Stroke warning signs and symptoms How to activate emergency medical system (call 911). Medications prescribed at discharge. Need for follow-up after discharge. Personal risk factors for stroke. Pneumonia vaccine given: No Flu vaccine given: No My questions have been answered, the writing is legible, and I understand these instructions.  I will adhere to these goals & educational materials that have been provided to me after my discharge from the hospital.

## 2014-11-25 NOTE — Progress Notes (Addendum)
  Progress Note Agree with note below. No new neurologic symptoms. Right groin fine.  Home after Neuro w/u complete. (For Echo and CT of brain today) He is not a candidate for surgery given the high bifurcation (not surgically accessible), and previous radiation therapy. Plan will be for maximal medical therapy.  Please send D/C summary to Dr. Cristopher Peru, MD, [place] [phone] Office: [phone]  11/25/2014 7:41 AM 1 Day Post-Op  Subjective:  Feeling good and states he is bored  Afebrile HR 60's-110's NSR 400'Q-676'P systolic 95% RA  Filed Vitals:   11/25/14 0730  BP: 160/100  Pulse: 89  Temp: 98.4 F (36.9 C)  Resp: 31    Physical Exam: Cardiac:  regylar Lungs:  Non labored Incisions:  Right groin soft without hematoma Neuro:  In tact   CBC    Component Value Date/Time   WBC 5.6 11/24/2014 1316   RBC 4.07* 11/24/2014 1316   HGB 10.8* 11/24/2014 1316   HCT 35.0* 11/24/2014 1316   PLT 290 11/24/2014 1316   MCV 86.0 11/24/2014 1316   MCH 26.5 11/24/2014 1316   MCHC 30.9 11/24/2014 1316   RDW 16.8* 11/24/2014 1316    BMET    Component Value Date/Time   NA 141 11/24/2014 0619   K 3.6 11/24/2014 0619   CL 103 11/24/2014 0619   GLUCOSE 101* 11/24/2014 0619   BUN 16 11/24/2014 0619   CREATININE 0.98 11/24/2014 1316   GFRNONAA >60 11/24/2014 1316   GFRAA >60 11/24/2014 1316    INR No results found for: INR   Intake/Output Summary (Last 24 hours) at 11/25/14 0741 Last data filed at 11/25/14 0500  Gross per 24 hour  Intake  586.2 ml  Output   1750 ml  Net -1163.8 ml     Assessment:  69 y.o. male is s/p:  1. Ultrasound-guided access to the right common femoral artery 2. Arch aortogram 3. Selective catheterization of the right common carotid artery (second-order catheterization) 4. Selective catheterization of the left common carotid artery (second-order catheterization)  1 Day Post-Op  Plan: -pt with expressive  aphasia during cerebral angiogram -appreciate neurology's help with this pt.  ASA has been changed to 325mg  daily.  Unclear if Plavix is to be started.   -pt continues to be asymptomatic -hypertensive-BP at 093'O systolic on arrival yesterday.  Will defer to neurology if any medication adjustments need to be made. -home once medications are clarified.   Leontine Locket, PA-C [facility] [phone] 11/25/2014 7:41 AM

## 2014-11-25 NOTE — Progress Notes (Signed)
STROKE TEAM PROGRESS NOTE   HISTORY James Richards is an 69 y.o. male hx of carotid stenosis, prior CVA presenting for scheduled diagnostic angiography with suspected left carotid stenosis. During procedure, noted to acutely develop expressive>receptive aphasia. LSW 0800 on 10/10. Symptoms persisted so code stroke activated. Symptoms gradually improved during evaluation. Per wife he is currently back to his baseline. She reports that this episode is similar to multiple recurrent events he has been having since January. Modified Rankin: Rankin Score=0. Patient was not administered TPA secondary to symptoms resolved, back to baseline.  Has a remote history of a CVA in which he had speech difficulties and weakness. She reports minimal to no residual deficits from that stroke. Was briefly on Eliquis for unclear reasons, currently taking ASA 81mg  prior to admission.    SUBJECTIVE (INTERVAL HISTORY) His wife is at the bedside.  Overall he feels his condition is stable. Symptoms resolved. However, his MRI did show right MCA moderate sized acute infarct. Cerebral angio did not assess left ICA well due to onset of symptoms. Would like to have CTA head and neck for further evaluation. However, pt is eager to go home and would not like to stay.    OBJECTIVE Temp:  [97.8 F (36.6 C)-98.5 F (36.9 C)] 98.4 F (36.9 C) (10/11 0800) Pulse Rate:  [65-96] 73 (10/11 0800) Cardiac Rhythm:  [-] Normal sinus rhythm (10/11 0800) Resp:  [11-31] 15 (10/11 0800) BP: (109-207)/(78-114) 152/79 mmHg (10/11 0800) SpO2:  [93 %-100 %] 93 % (10/11 0800)  CBC:   Recent Labs Lab 11/24/14 1316 11/25/14 0715  WBC 5.6 6.9  HGB 10.8* 11.0*  HCT 35.0* 35.1*  MCV 86.0 85.0  PLT 290 469    Basic Metabolic Panel:   Recent Labs Lab 11/24/14 0619 11/24/14 1316 11/25/14 0715  NA 141  --  138  K 3.6  --  3.7  CL 103  --  101  CO2  --   --  27  GLUCOSE 101*  --  105*  BUN 16  --  11  CREATININE 1.00 0.98 0.90   CALCIUM  --   --  9.6    Lipid Panel:     Component Value Date/Time   CHOL 92 11/25/2014 0715   TRIG 58 11/25/2014 0715   HDL 36* 11/25/2014 0715   CHOLHDL 2.6 11/25/2014 0715   VLDL 12 11/25/2014 0715   LDLCALC 44 11/25/2014 0715   HgbA1c: No results found for: HGBA1C Urine Drug Screen: No results found for: LABOPIA, COCAINSCRNUR, LABBENZ, AMPHETMU, THCU, LABBARB    IMAGING I have personally reviewed the radiological images below and agree with the radiology interpretations.  Ct Head Wo Contrast 11/24/2014    Age-indeterminate left MCA infarct.    Mr Brain Wo Contrast 11/24/2014    1. Multi focal ischemic infarcts involving the right MCA territory, with predominant involvement of the right temporal lobe as detailed above. No associated hemorrhage or mass effect. 2. Additional tiny cortical ischemic infarct within the left occipital lobe. 3. Remote left MCA territory infarcts with additional remote lacunar infarcts within the left thalamus and right paramedian pons. 4. Atrophy with mild chronic small vessel ischemic disease.     Cerebral angio - 1. The aortic arch is widely patent with no significant atherosclerotic disease. 2. The patient has a bovine arch. There did not appear to be any significant disease in the innominate artery, right common carotid artery, right subclavian artery. 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 3. On the left side, the common carotid artery is widely patent. The carotid bifurcation is very high with sharp angulation of the internal carotid artery. There appears to be some stenosis of approximately 60% in the mid segment of the internal carotid artery. Selective injection of the left common carotid artery was not performed so the visualization is somewhat limited.  CTA head and neck - pending but pt would like go home without finish the test.   2D echo - - Left ventricle: The  cavity size was normal. There was mild focal basal hypertrophy of the septum. Systolic function was normal. The estimated ejection fraction was in the range of 55% to 60%. Wall motion was normal; there were no regional wall motion abnormalities. Doppler parameters are consistent with abnormal left ventricular relaxation (grade 1 diastolic dysfunction).  PHYSICAL EXAM  Temp:  [97.9 F (36.6 C)-98.6 F (37 C)] 98.6 F (37 C) (10/11 1134) Pulse Rate:  [70-96] 73 (10/11 0800) Resp:  [15-31] 22 (10/11 1134) BP: (109-179)/(79-102) 155/94 mmHg (10/11 1134) SpO2:  [93 %-100 %] 94 % (10/11 1134)  General - Well nourished, well developed, in no apparent distress.  Ophthalmologic - Fundi not visualized due to noncooperation.  Cardiovascular - Regular rate and rhythm with no murmur.  Mental Status -  Level of arousal and orientation to time, place, and person were intact. Language including expression, naming, repetition, comprehension was assessed and found intact.  Cranial Nerves II - XII - II - Visual field intact OU. III, IV, VI - Extraocular movements intact. V - Facial sensation intact bilaterally. VII - mild left facial droop. VIII - Hard of hearing, R>L, & vestibular intact bilaterally. X - Palate elevates symmetrically. XI - Chin turning & shoulder shrug intact bilaterally. XII - Tongue protrusion intact.  Motor Strength - The patient's strength was normal in all extremities and pronator drift was absent.  Bulk was normal and fasciculations were absent.   Motor Tone - Muscle tone was assessed at the neck and appendages and was normal.  Reflexes - The patient's reflexes were 1+ in all extremities and he had no pathological reflexes.  Sensory - Light touch, temperature/pinprick were assessed and were symmetrical.    Coordination - The patient had normal movements in the hands with no ataxia or dysmetria.  Tremor was absent.  Gait and Station - not tested due to  safety concerns.   ASSESSMENT/PLAN Mr. MORONI NESTER is a 69 y.o. male with history of carotid stenosis, prior CVA who developed expressive aphasia following a diagnostic angio looking for L ICA stenosis. He did not receive IV t-PA due to resolved symptoms.   Stroke:  Multiple R MCA territory and tiny L occipital infarcts embolic likely related to procedure given temporal relationship.   Resultant  Resolution of symptoms  MRI  Multiple R MCA territory and tiny L occipital infarcts  Cerebro angio left CCA/ICA 60-70%, tortuous with 3 sharp turns and high bifurcation, not a candidate for carotid stenting from a femoral or R brachial approach  CTA neck 11/10/14 - left ICA 75-80% stenosis  CTA head pending but pt would like to go home without finish it.  CUS 11/19/14 - left 60-79% stenosis  2D Echo  EF 55-60%  LDL 44  HgbA1c pending  lovenox for VTE prophylaxis Diet Heart Room service appropriate?: Yes; Fluid consistency:: Thin  aspirin 81 mg orally every day and eliquis (apixaban) prior to admission, now  on aspirin 325 mg orally every day. Recommend ASA 325mg  and plavix 75mg  dual antiplatelet for 3 months and then plavix alone.  Patient counseled to be compliant with his antithrombotic medications  Ongoing aggressive stroke risk factor management  L ICA stenosis  Not felt to be a CAS or CEA candidate due to above mentioned reasons  Need to continue maximized medical treatment with dual antiplatelet for 3 months and high dose statin  Follow up with Dr. Scot Dock and Dr. Metta Clines  Hypertension  Stable  Avoid hypotension  BP goal 130-150 due to high grade ICA stenosis  Hyperlipidemia  Home meds:  Fish oil and lipitor 80, resumed in hospital  LDL 44, goal < 70  Continue high dose statin  Continue statin at discharge  Other Stroke Risk Factors  Advanced age  Former Cigarette smoker, quit smoking 7 years ago   Obesity, Body mass index is 32.45 kg/(m^2).   Hx  stroke/TIA  Other Active Problems  Hx throat/tongue cancer s/p OR and XRT  Hospital day # 1  Neurology will sign off. Please call with questions. Pt will follow up with Neurology Dr. Metta Clines in [place]. Thanks for the consult.  Rosalin Hawking, MD PhD Stroke Neurology 11/25/2014 3:52 PM   To contact Stroke Continuity provider, please refer to [email or web address] After hours, contact General Neurology

## 2014-11-25 NOTE — Progress Notes (Signed)
DC orders received.  Patient stable with no S/S of distress.  Medication and discharge information reviewed with patient and patient's siblings.  Stroke education reviewed with patient and siblings.  Patient DC home. [place], Ardeth Sportsman

## 2014-11-25 NOTE — Discharge Summary (Signed)
[facility] Discharge Summary   Patient ID:  James Richards MRN: [id number] DOB/AGE: 1945-09-21 69 y.o.  Admit date: 11/24/2014 Discharge date: 11/25/2014 Date of Surgery: 11/24/2014 Surgeon: Surgeon(s): Angelia Mould, MD  Admission Diagnosis: left carotid artery stenosis  Discharge Diagnoses:  left carotid artery stenosis  Secondary Diagnoses: Past Medical History  Diagnosis Date  . Carotid artery occlusion     throat  . Stroke [facility]) 1987 , 2014  . Cancer [facility]) Jun 30, 2007    Throat , Tongue    Procedure(s): Aortic Arch Angiography Cerebral Angiography  Discharged Condition: good  HPI: James Richards is a 69 y.o. male, who is right handed, and who was referred for evaluation of a left carotid stenosis. I have reviewed his records that were sent with him. 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 He had follow up visits in March, May, August, and most recently September.   In my history, he states that in January he was having problems with left arm tingling and weakness. He also admits to episodes of transient loss of vision in the left eye which would last only briefly. This is been going on since January. His most recent episode was one week ago. He also admits to some episodes of expressive aphasia. He had been on aspirin but this was recently discontinued as he was started on Eliquis. He is not sure why he is on Eliquis.  Of note he is on Eliquis. He is also on aspirin and is on a statin. The patient told Dr. Scot Dock that he has been having repeated episodes of amaurosis fugax in the left eye. He has also had episodes of expressive aphasia. He denies any right-sided weakness. His CT scan suggests a 75-80% left carotid stenosis. His duplex can today shows a 60-79% stenosis although these velocities may be falsely elevated because the internal carotid artery is tortuous. In  addition the patient has a high bifurcation on the left. This reason I recommended that we proceed with a cerebral arteriogram to determine the severity of the stenosis and also to determine if the disease is surgically accessible.    Hospital Course:  James Richards is a 69 y.o. male is S/P Procedure(s): Aortic Arch Angiography Cerebral Angiography FINDINGS:  1. The aortic arch is widely patent with no significant atherosclerotic disease. 2. The patient has a bovine arch. There did not appear to be any significant disease in the innominate artery, right common carotid artery, right subclavian artery. 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 3. On the left side, the common carotid artery is widely patent. The carotid bifurcation is very high with sharp angulation of the internal carotid artery. There appears to be some stenosis of approximately 60% in the mid segment of the internal carotid artery. Selective injection of the left common carotid artery was not performed so the visualization is somewhat limited.  CLINICAL NOTE: This patient has been having repeated episodes of amaurosis fugax in the left eye and also expressive aphasia. He developed an episode of expressive aphasia during the procedure. Neurology is evaluating and he is having a stat CT scan. Given the high bifurcation I do not think the disease in his mid segment of his internal carotid artery is surgically accessible. Because of the extreme tortuosity of the internal carotid artery carotid stenting may also  be technically not possible although I think this should be a consideration. He is on aspirin. He is on a statin. He was on Eliquis but was not sure why and this is been discontinued for now.  Deitra Mayo, MD, FACS [facility]    Consults:  Stroke  Dr. Collier Salina Summer Date last known well: 11/24/2014 Time last  known well: 0800 tPA Given: no, symptoms resolved, back to baseline Modified Rankin: Rankin Score=0  Canceled CTA brain canceled patient wants to go home and the CTA can't be performed until late this evening.   Plan  Asprin 325 mg daily Plavix 75 mg PO daily for 3 months  Significant Diagnostic Studies: CBC Lab Results  Component Value Date   WBC 6.9 11/25/2014   HGB 11.0* 11/25/2014   HCT 35.1* 11/25/2014   MCV 85.0 11/25/2014   PLT 300 11/25/2014    BMET    Component Value Date/Time   NA 138 11/25/2014 0715   K 3.7 11/25/2014 0715   CL 101 11/25/2014 0715   CO2 27 11/25/2014 0715   GLUCOSE 105* 11/25/2014 0715   BUN 11 11/25/2014 0715   CREATININE 0.90 11/25/2014 0715   CALCIUM 9.6 11/25/2014 0715   GFRNONAA >60 11/25/2014 0715   GFRAA >60 11/25/2014 0715   COAG No results found for: INR, PROTIME   Disposition:  Discharge to :Home    Medication List    ASK your doctor about these medications        amLODipine 2.5 MG tablet  Commonly known as:  NORVASC  Take 1 tablet by mouth daily.     apixaban 5 MG Tabs tablet  Commonly known as:  ELIQUIS  Take 5 mg by mouth 2 (two) times daily.     aspirin 81 MG tablet  Take 81 mg by mouth daily.     Fish Oil 1200 MG Cpdr  Take 1 capsule by mouth daily.     levothyroxine 112 MCG tablet  Commonly known as:  SYNTHROID, LEVOTHROID  Take 1 tablet by mouth daily.     LIPITOR 80 MG tablet  Generic drug:  atorvastatin  Take 80 mg by mouth daily at 6 PM.     minocycline 50 MG capsule  Commonly known as:  MINOCIN,DYNACIN  Take 1 capsule by mouth daily.     multivitamin with minerals Tabs tablet  Take 1 tablet by mouth daily.     omeprazole 40 MG capsule  Commonly known as:  PRILOSEC  Take 1 capsule by mouth daily.       Verbal and written Discharge instructions given to the patient. Wound care per Discharge AVS     Follow-up Information    Follow up with Deitra Mayo, MD In 6 months.    Specialties:  Vascular Surgery, Cardiology   Why:  Office will call you to arrange your appt (sent)   Contact information:   [address] [place] [address] [phone]       Follow up with Charlaine Dalton. Schedule an appointment as soon as possible for a visit in 2 weeks.   Why:  Please call to make an appt for the next 2 weeks.   Contact information:   Neurology- [address] [place], [place]      Signed: Laurence Slate [facility] 11/25/2014, 1:34 PM

## 2014-11-25 NOTE — Progress Notes (Signed)
  Echocardiogram 2D Echocardiogram has been performed.  James Richards R 11/25/2014, 11:01 AM

## 2014-11-26 ENCOUNTER — Telehealth: Payer: Self-pay

## 2014-11-26 ENCOUNTER — Telehealth: Payer: Self-pay | Admitting: Vascular Surgery

## 2014-11-26 ENCOUNTER — Encounter: Payer: Self-pay | Admitting: Legal Medicine

## 2014-11-26 LAB — HEMOGLOBIN A1C
Hgb A1c MFr Bld: 6.6 % — ABNORMAL HIGH (ref 4.8–5.6)
Mean Plasma Glucose: 143 mg/dL

## 2014-11-26 NOTE — Telephone Encounter (Signed)
LM for pt re appt, dpm °

## 2014-11-26 NOTE — Telephone Encounter (Signed)
Phone call from pt's cousin to clarify if the patient is to resume Eliquis.  Reported she received conflicting information about whether he should restart it or stop it altogether.  Discussed with Dr. Scot Dock.  Recommended to have pt. stop Eliquis, but to continue the ASA 325 mg qd, and Plavix 75 mg qd.   Notified pt's cousin of recommendation to discontinue Eliquis; verb. understanding.

## 2014-11-26 NOTE — Telephone Encounter (Signed)
-----   Message from Gabriel Earing, [place] sent at 11/25/2014 12:15 PM EDT ----- F/u with Dr. Scot Dock in 6 months with a carotid duplex.  Thanks, Aldona Bar

## 2014-12-01 ENCOUNTER — Telehealth: Payer: Self-pay

## 2014-12-01 DIAGNOSIS — R1909 Other intra-abdominal and pelvic swelling, mass and lump: Secondary | ICD-10-CM

## 2014-12-01 DIAGNOSIS — R58 Hemorrhage, not elsewhere classified: Secondary | ICD-10-CM

## 2014-12-01 NOTE — Telephone Encounter (Signed)
Phone call from pts. Cousin.  Reported new finding of a knot above right groin that "is approx. size of thumb knuckle."  Reported the pt. Just noticed this on Saturday.  Stated there is a large amt of bruising surrounding the right groin area.  Denied any swelling of the right groin; stated only noticed the "knot".  Reported he had episode of feeling dizzy on Saturday, and feeling weak and hot yesterday. Per Dr. Trula Slade, schedule for right groin ultrasound and office appt.  Pt. Will be notified of appt.

## 2014-12-02 ENCOUNTER — Ambulatory Visit (INDEPENDENT_AMBULATORY_CARE_PROVIDER_SITE_OTHER): Payer: Self-pay | Admitting: Family

## 2014-12-02 ENCOUNTER — Encounter: Payer: Self-pay | Admitting: Family

## 2014-12-02 ENCOUNTER — Ambulatory Visit (HOSPITAL_COMMUNITY)
Admission: RE | Admit: 2014-12-02 | Discharge: 2014-12-02 | Disposition: A | Discharge status: Home / Self Care | Payer: Commercial Managed Care - HMO | Source: Ambulatory Visit | Attending: Family | Admitting: Family

## 2014-12-02 VITALS — BP 128/75 | HR 74 | Temp 97.2°F | Resp 16 | Ht 65.0 in | Wt 195.0 lb

## 2014-12-02 DIAGNOSIS — S301XXA Contusion of abdominal wall, initial encounter: Secondary | ICD-10-CM

## 2014-12-02 DIAGNOSIS — R1909 Other intra-abdominal and pelvic swelling, mass and lump: Secondary | ICD-10-CM

## 2014-12-02 DIAGNOSIS — R58 Hemorrhage, not elsewhere classified: Secondary | ICD-10-CM

## 2014-12-02 NOTE — Telephone Encounter (Signed)
Spoke with patient who was unaware that he needed to be seen. Spoke with pts cousin who agreed to appointment.

## 2014-12-02 NOTE — Progress Notes (Signed)
[facility] [place] HISTORY AND PHYSICAL   MRN : [id number]  History of Present Illness:   James Richards is a 68 y.o. male patient of Dr. Scot Dock with stable right lower extremity calf claudication. He had an arch aortic angiogram on 11/24/14 to better evaluate his carotid arteries. He returns today with c/o a knot that he noticed in his right groin only when lying supine, he does not notice it on standing. Pt states the bruising in his right groin is improving, the right groin knot is not getting bigger.  Pt denies claudication symptoms with walking, denies non healing wounds. He had a stroke at age 21, and 64 TIA's since then, 2 of which he had no symptoms. His stroke and TIA manifested as receptive and expressive aphasia. He had right hemiparesis for about a day. He denies any monocular loss of vision. He states that his stroke was a cerebral bleed.  He states he had a Duplex of his neck about 2005 at [facility] in [place], pt states results were normal, but that his left carotid pulse is difficult to find. Lymph nodes in neck removed in 2009 for tongue cancer, then he had radiation to the right side of his neck, also had chemotherapy. Pt has not had previous vascular intervention of legs . He has a hx of PE during the chemo and radiation treatment, treated with Lovenox for 6 months.  The patient denies New Medical or Surgical History.  Pt Diabetic: No Pt smoker: former smoker, quit in 2009  Pt meds include: Statin :No, states his cholesterol is OK. ASA: Yes Other anticoagulants/antiplatelets: no     Current Outpatient Prescriptions  Medication Sig Dispense Refill  . amLODipine (NORVASC) 2.5 MG tablet Take 1 tablet by mouth daily.    Marland Kitchen aspirin 325 MG tablet Take 1 tablet (325 mg total) by mouth daily. 30 tablet 3  . atorvastatin (LIPITOR) 80 MG tablet Take 80 mg by mouth daily at 6 PM.     . clopidogrel (PLAVIX) 75 MG tablet Take 1 tablet (75 mg  total) by mouth daily. 30 tablet 2  . levothyroxine (SYNTHROID, LEVOTHROID) 112 MCG tablet Take 1 tablet by mouth daily.    . minocycline (MINOCIN,DYNACIN) 50 MG capsule Take 1 capsule by mouth daily.    . Multiple Vitamin (MULTIVITAMIN WITH MINERALS) TABS tablet Take 1 tablet by mouth daily.    . Omega-3 Fatty Acids (FISH OIL) 1200 MG CPDR Take 1 capsule by mouth daily.    Marland Kitchen omeprazole (PRILOSEC) 40 MG capsule Take 1 capsule by mouth daily.    Marland Kitchen apixaban (ELIQUIS) 5 MG TABS tablet Take 5 mg by mouth 2 (two) times daily.      No current facility-administered medications for this visit.    Past Medical History  Diagnosis Date  . Carotid artery occlusion     throat  . Stroke [facility]) 1987 , 2014  . Cancer [facility]) Jun 30, 2007    Throat , Tongue    Social History Social History  Substance Use Topics  . Smoking status: Former Smoker    Types: Cigarettes    Quit date: 02/15/2007  . Smokeless tobacco: Former Systems developer    Types: Chew  . Alcohol Use: No    Family History Family History  Problem Relation Age of Onset  . Other Father     amputation  . Deep vein thrombosis Father   . Heart disease Father     Atrial Fib.  Marland Kitchen  Cancer Sister   . Hypertension Brother     Surgical History Past Surgical History  Procedure Laterality Date  . Lymphadenectomy    . Peripheral vascular catheterization N/A 11/24/2014    Procedure: Aortic Arch Angiography;  Surgeon: Angelia Mould, MD;  Location: [place] [facility];  Service: Cardiovascular;  Laterality: N/A;  . Peripheral vascular catheterization N/A 11/24/2014    Procedure: Cerebral Angiography;  Surgeon: Angelia Mould, MD;  Location: [place] [facility];  Service: Cardiovascular;  Laterality: N/A;    No Known Allergies  Current Outpatient Prescriptions  Medication Sig Dispense Refill  . amLODipine (NORVASC) 2.5 MG tablet Take 1 tablet by mouth daily.    Marland Kitchen aspirin 325 MG tablet Take 1 tablet (325 mg total) by mouth daily. 30  tablet 3  . atorvastatin (LIPITOR) 80 MG tablet Take 80 mg by mouth daily at 6 PM.     . clopidogrel (PLAVIX) 75 MG tablet Take 1 tablet (75 mg total) by mouth daily. 30 tablet 2  . levothyroxine (SYNTHROID, LEVOTHROID) 112 MCG tablet Take 1 tablet by mouth daily.    . minocycline (MINOCIN,DYNACIN) 50 MG capsule Take 1 capsule by mouth daily.    . Multiple Vitamin (MULTIVITAMIN WITH MINERALS) TABS tablet Take 1 tablet by mouth daily.    . Omega-3 Fatty Acids (FISH OIL) 1200 MG CPDR Take 1 capsule by mouth daily.    Marland Kitchen omeprazole (PRILOSEC) 40 MG capsule Take 1 capsule by mouth daily.    Marland Kitchen apixaban (ELIQUIS) 5 MG TABS tablet Take 5 mg by mouth 2 (two) times daily.      No current facility-administered medications for this visit.     REVIEW OF SYSTEMS: See HPI for pertinent positives and negatives.  Physical Examination Filed Vitals:   12/02/14 1331  BP: 128/75  Pulse: 74  Temp: 97.2 F (36.2 C)  Resp: 16  Height: 5\' 5"  (1.651 m)  Weight: 195 lb (88.451 kg)  SpO2: 99%   Body mass index is 32.45 kg/(m^2).  General: A&O x 3, WDWN, obese male. Gait: normal Eyes: PERRLA. Pulmonary: respirations are non labored Cardiac: regular rhythm, no detected murmur.     Carotid Bruits Right Left   Negative Negative  Aorta is not palpable. Radial pulses: are 2+ palpable and =   VASCULAR EXAM: Extremities without ischemic changes  without Gangrene; without open wounds. Palpable 1.5 cm hard mass in right groin. The mass is not pulsatile, not painful to palpation. Ecchymosis at right groin is improving.      LE Pulses Right Left   FEMORAL 1+ palpable 2+ palpable    POPLITEAL 1+ palpable  not palpable   POSTERIOR TIBIAL not palpable  1+ palpable    DORSALIS PEDIS  ANTERIOR  TIBIAL 1+ palpable  1+ palpable    Abdomen: soft, NT, no palpable masses, see Extremities. Skin: no rashes, no ulcers, see Extremities. Musculoskeletal: no muscle wasting or atrophy. Neurologic: A&O X 3; Appropriate Affect ; SENSATION: normal; MOTOR FUNCTION: moving all extremities equally, motor strength 5/5 throughout. Speech is fluent/normal. CN 2-12 intact.         CT ANGIOGRAM OF THE NECK ON 11/10/2014: This shows a 75-80% stenosis in the proximal left internal carotid artery. This stenosis subserves a vascular territory affected by the cerebral infarcts on the previous MRI in January and could potentially contribute to the suspected watershed pattern of infarction. There was a less than 50% stenosis in the right carotid artery.  CAROTID DUPLEX SCAN ON 02/27/2014:  This shows no evidence of carotid stenosis on the right. There was no significant stenosis noted on the left either. The right vertebral artery appeared to be occluded.  MRI OF THE HEAD ON 02/24/2014: This shows a left frontal, posterior frontal, and parietal acute cortical infarcts with suspected subacute left occipital cortical infarct. This was felt to be a possible watershed type insult.  CAROTID DUPLEX SCAN 11/19/14:  This shows a 60-79% left carotid stenosis by velocity criteria. However the patient has a high bifurcation and also a very tortuous artery suggesting that these velocities may be falsely elevated   Non-Invasive Vascular Imaging (12/02/2014): Limited lower extremity arterial duplex: No distinguishable mass or other structure noted in the right groin. No evidence of extravascular right groin area flow, normal arterial and venous flow.    ASSESSMENT:  James Richards is a 69 y.o. male who is s/p arch aortic angiogram on 11/24/14 to better evaluate his carotid arteries. He returns today with c/o a knot that he noticed in his right groin only when lying supine, he does not notice it on standing. Pt  states the bruising in his right groin is improving, the right groin knot is not getting bigger. Today's limited LE arterial duplex suggests no pseudoaneurysm at right groin. The semi hard mass in his right groin is most likely a small hematoma from the femoral artery puncture access site for the arch aortic angiogram 8 days ago. He has no pain or tenderness at his right groin and the ecchymosis is improving.   PLAN:   Based on today's exam and non-invasive vascular lab results, the patient will follow up in April 2017 as already scheduled with Dr. Scot Dock. I discussed in depth with the patient the nature of atherosclerosis, and emphasized the importance of maximal medical management including strict control of blood pressure, blood glucose, and lipid levels, obtaining regular exercise, and cessation of smoking.  The patient is aware that without maximal medical management the underlying atherosclerotic disease process will progress, limiting the benefit of any interventions. Consideration for repair of AAA would be made when the size approaches 4.8 or 5.0 cm, growth > 1 cm/yr, and symptomatic status. The patient was given information about stroke prevention and what symptoms should prompt the patient to seek immediate medical care. The patient was given information about AAA including signs, symptoms, treatment,  what symptoms should prompt the patient to seek immediate medical care, and how to minimize the risk of enlargement and rupture of aneurysms. The patient was given information about PAD including signs, symptoms, treatment, what symptoms should prompt the patient to seek immediate medical care, and risk reduction measures to take. Thank you for allowing [place] to participate in this patient's care.  Clemon Chambers, RN, MSN, FNP-C [facility] Office: [phone]  Clinic MD: Early  12/02/2014 1:57 PM

## 2014-12-11 DIAGNOSIS — Z8673 Personal history of transient ischemic attack (TIA), and cerebral infarction without residual deficits: Secondary | ICD-10-CM

## 2014-12-11 HISTORY — DX: Personal history of transient ischemic attack (TIA), and cerebral infarction without residual deficits: Z86.73

## 2015-01-21 DIAGNOSIS — C01 Malignant neoplasm of base of tongue: Secondary | ICD-10-CM

## 2015-01-21 HISTORY — DX: Malignant neoplasm of base of tongue: C01

## 2015-05-27 ENCOUNTER — Encounter (HOSPITAL_COMMUNITY): Payer: Commercial Managed Care - HMO

## 2015-05-27 ENCOUNTER — Encounter: Payer: Commercial Managed Care - HMO | Admitting: Vascular Surgery

## 2016-02-16 DIAGNOSIS — Z9889 Other specified postprocedural states: Secondary | ICD-10-CM | POA: Diagnosis not present

## 2016-02-16 DIAGNOSIS — Z08 Encounter for follow-up examination after completed treatment for malignant neoplasm: Secondary | ICD-10-CM | POA: Diagnosis not present

## 2016-02-16 DIAGNOSIS — C022 Malignant neoplasm of ventral surface of tongue: Secondary | ICD-10-CM | POA: Diagnosis not present

## 2016-02-16 DIAGNOSIS — Z9049 Acquired absence of other specified parts of digestive tract: Secondary | ICD-10-CM | POA: Diagnosis not present

## 2016-02-16 DIAGNOSIS — Z87891 Personal history of nicotine dependence: Secondary | ICD-10-CM | POA: Diagnosis not present

## 2016-02-16 DIAGNOSIS — Z85818 Personal history of malignant neoplasm of other sites of lip, oral cavity, and pharynx: Secondary | ICD-10-CM | POA: Diagnosis not present

## 2016-05-24 DIAGNOSIS — Z923 Personal history of irradiation: Secondary | ICD-10-CM | POA: Diagnosis not present

## 2016-05-24 DIAGNOSIS — Z9221 Personal history of antineoplastic chemotherapy: Secondary | ICD-10-CM | POA: Diagnosis not present

## 2016-05-24 DIAGNOSIS — C099 Malignant neoplasm of tonsil, unspecified: Secondary | ICD-10-CM | POA: Diagnosis not present

## 2016-05-24 DIAGNOSIS — Z9889 Other specified postprocedural states: Secondary | ICD-10-CM | POA: Diagnosis not present

## 2016-05-24 DIAGNOSIS — C022 Malignant neoplasm of ventral surface of tongue: Secondary | ICD-10-CM | POA: Diagnosis not present

## 2016-05-24 DIAGNOSIS — C01 Malignant neoplasm of base of tongue: Secondary | ICD-10-CM | POA: Diagnosis not present

## 2016-07-20 IMAGING — CT CT HEAD W/O CM
2 series · 15 of 30 positions shown, 17 images · non-contrast
Comparison: None.

CLINICAL DATA: Became disoriented during cardiac catheterization.

EXAM:
CT HEAD WITHOUT CONTRAST
TECHNIQUE: Contiguous axial images were obtained from the base of the skull
through the vertex without intravenous contrast.

[Series 2: head without · axial · non-contrast · 0.47mm/px · z∈[-45,+75]mm · 7 of 33 slices shown, 9 images]
[im 5/33  brain]
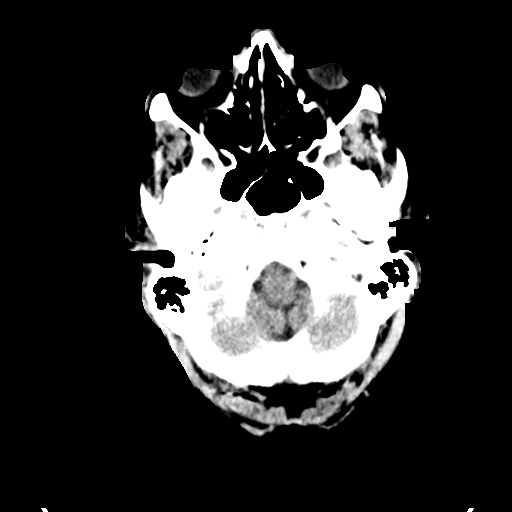
[im 5/33  bone]
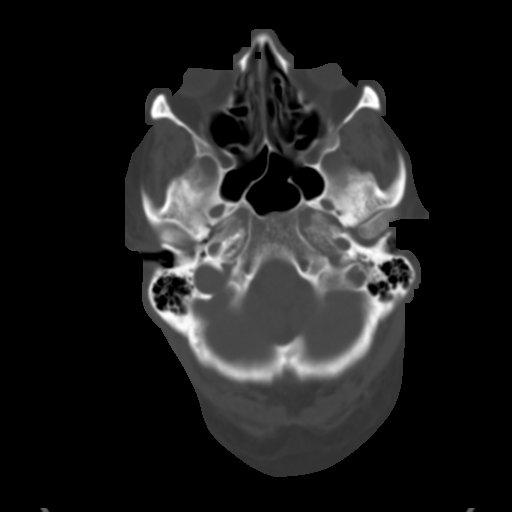
[im 9/33  brain]
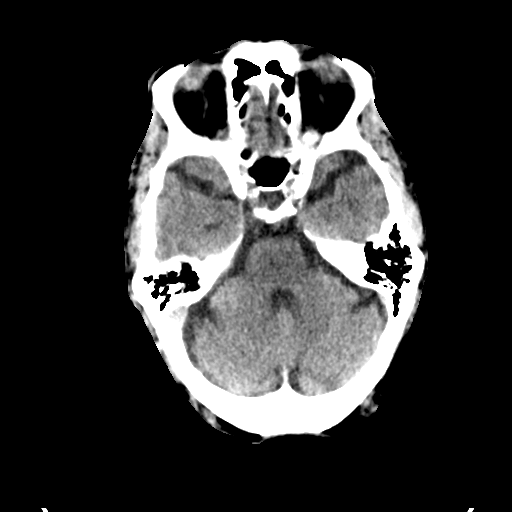
[im 13/33  brain]
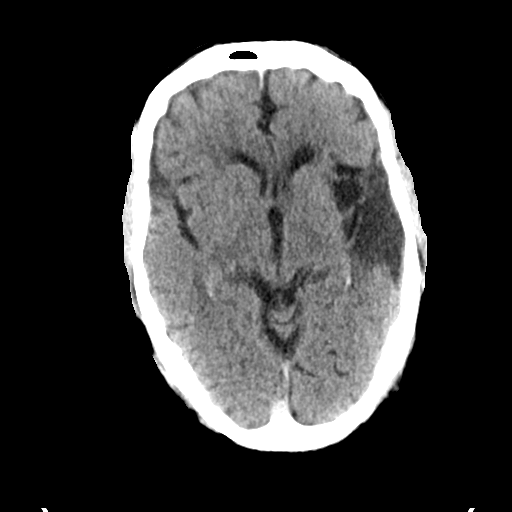
[im 17/33  brain]
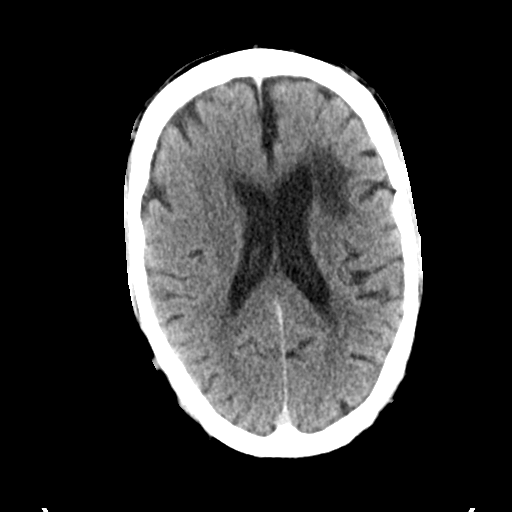
[im 21/33  brain]
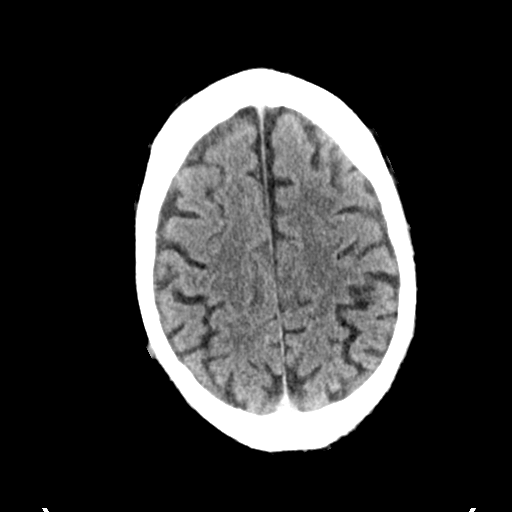
[im 21/33  bone]
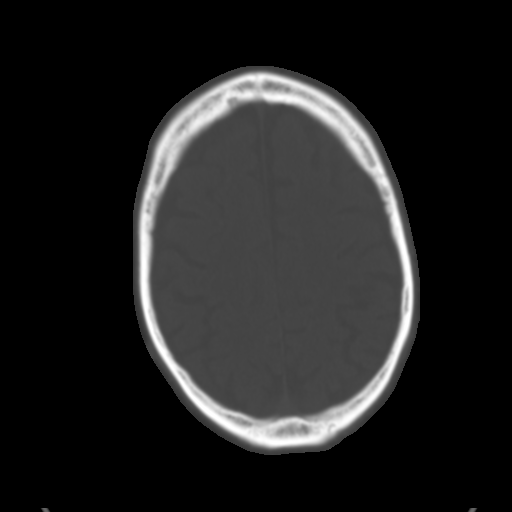
[im 25/33  brain]
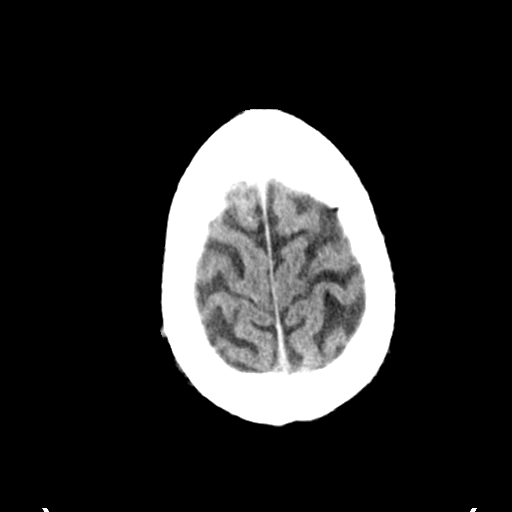
[im 29/33  brain]
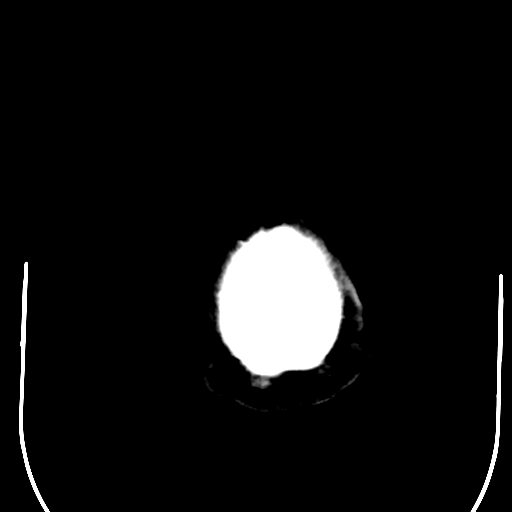

[Series 3: head bone · axial · 0.47mm/px · z∈[-49,+79]mm · 8 of 82 slices shown]
[im 9/82  bone]
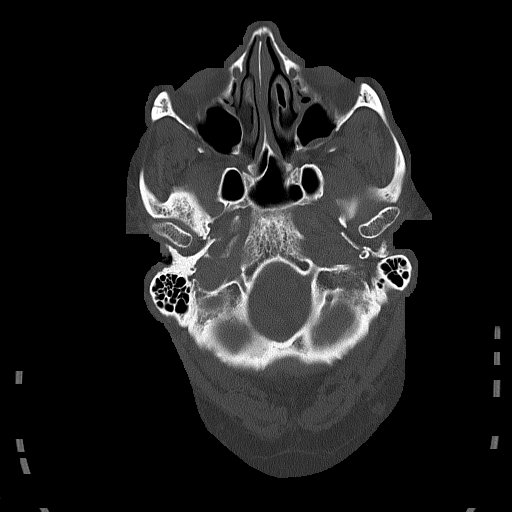
[im 17/82  bone]
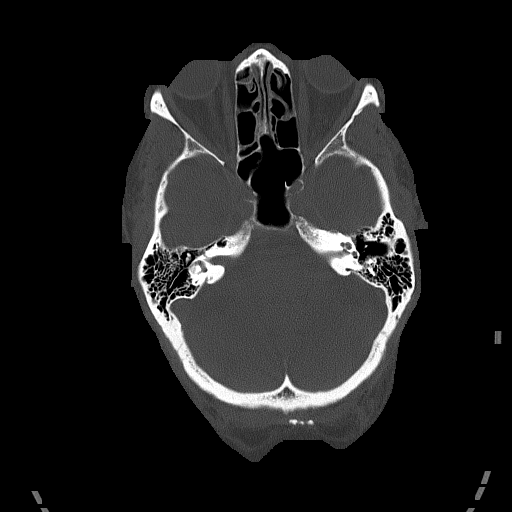
[im 25/82  bone]
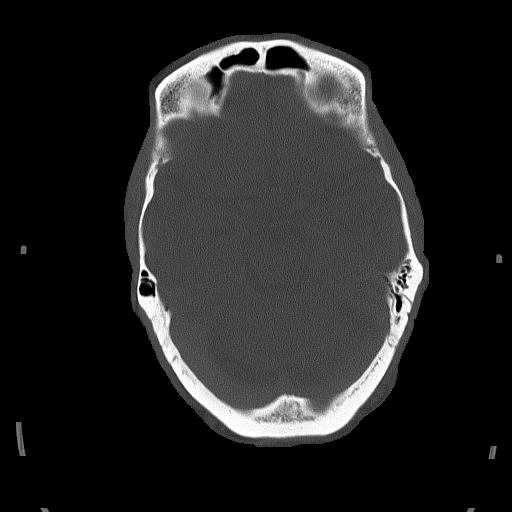
[im 37/82  bone]
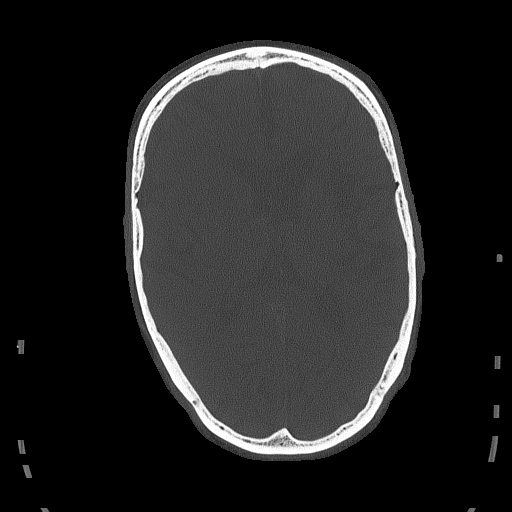
[im 45/82  bone]
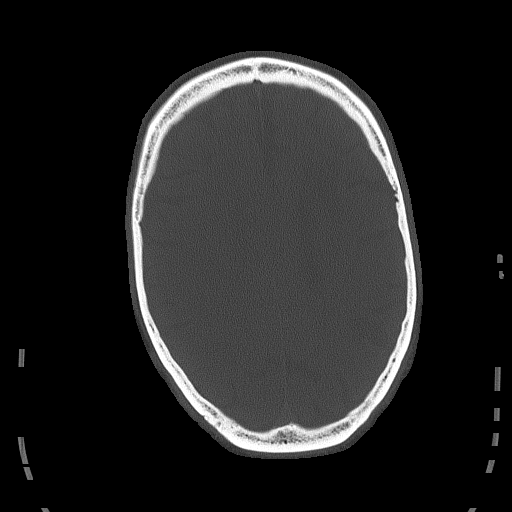
[im 57/82  bone]
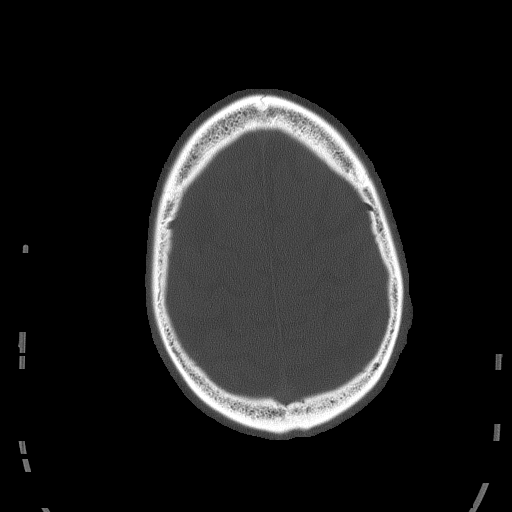
[im 65/82  bone]
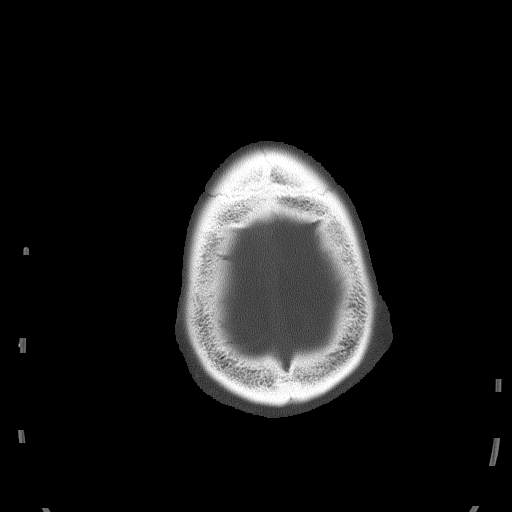
[im 73/82  bone]
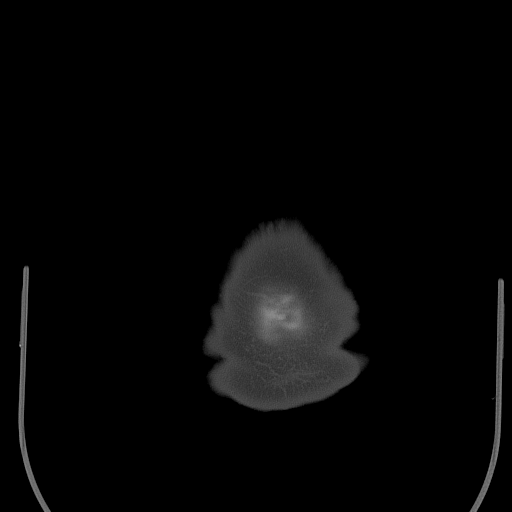

[15 of 30 positions shown; findings below may reference images not displayed]

FINDINGS: Low-density noted within the left temporal lobe and left frontal
lobe compatible with infarct, age indeterminate. Favor subacute to
chronic. No hemorrhage. No hydrocephalus or mass lesion. No midline
shift.

Visualized paranasal sinuses and mastoids clear. Orbital soft
tissues unremarkable.
IMPRESSION: Age-indeterminate left MCA infarct.

Critical Value/emergent results were called by telephone at the time
of interpretation on 11/24/2014 at [DATE] to Dr. NAUTICA VON , who
verbally acknowledged these results.

## 2016-08-08 DIAGNOSIS — E782 Mixed hyperlipidemia: Secondary | ICD-10-CM | POA: Diagnosis not present

## 2016-08-08 DIAGNOSIS — I10 Essential (primary) hypertension: Secondary | ICD-10-CM | POA: Diagnosis not present

## 2016-08-08 DIAGNOSIS — E038 Other specified hypothyroidism: Secondary | ICD-10-CM | POA: Diagnosis not present

## 2016-10-06 DIAGNOSIS — Z0000 Encounter for general adult medical examination without abnormal findings: Secondary | ICD-10-CM | POA: Diagnosis not present

## 2016-10-06 DIAGNOSIS — Z6831 Body mass index (BMI) 31.0-31.9, adult: Secondary | ICD-10-CM | POA: Diagnosis not present

## 2016-11-08 DIAGNOSIS — C01 Malignant neoplasm of base of tongue: Secondary | ICD-10-CM | POA: Diagnosis not present

## 2016-11-08 DIAGNOSIS — Z85818 Personal history of malignant neoplasm of other sites of lip, oral cavity, and pharynx: Secondary | ICD-10-CM | POA: Diagnosis not present

## 2016-11-08 DIAGNOSIS — Z9049 Acquired absence of other specified parts of digestive tract: Secondary | ICD-10-CM | POA: Diagnosis not present

## 2016-11-08 DIAGNOSIS — Z923 Personal history of irradiation: Secondary | ICD-10-CM | POA: Diagnosis not present

## 2016-11-08 DIAGNOSIS — Z85810 Personal history of malignant neoplasm of tongue: Secondary | ICD-10-CM | POA: Diagnosis not present

## 2016-11-08 DIAGNOSIS — Z08 Encounter for follow-up examination after completed treatment for malignant neoplasm: Secondary | ICD-10-CM | POA: Diagnosis not present

## 2016-11-08 DIAGNOSIS — C022 Malignant neoplasm of ventral surface of tongue: Secondary | ICD-10-CM | POA: Diagnosis not present

## 2016-11-08 DIAGNOSIS — Z9221 Personal history of antineoplastic chemotherapy: Secondary | ICD-10-CM | POA: Diagnosis not present

## 2016-11-15 DIAGNOSIS — Z23 Encounter for immunization: Secondary | ICD-10-CM | POA: Diagnosis not present

## 2016-12-08 DIAGNOSIS — I10 Essential (primary) hypertension: Secondary | ICD-10-CM | POA: Diagnosis not present

## 2016-12-08 DIAGNOSIS — Z23 Encounter for immunization: Secondary | ICD-10-CM | POA: Diagnosis not present

## 2016-12-08 DIAGNOSIS — E038 Other specified hypothyroidism: Secondary | ICD-10-CM | POA: Diagnosis not present

## 2016-12-08 DIAGNOSIS — E782 Mixed hyperlipidemia: Secondary | ICD-10-CM | POA: Diagnosis not present

## 2016-12-09 DIAGNOSIS — S46001A Unspecified injury of muscle(s) and tendon(s) of the rotator cuff of right shoulder, initial encounter: Secondary | ICD-10-CM | POA: Diagnosis not present

## 2017-01-09 DIAGNOSIS — H35033 Hypertensive retinopathy, bilateral: Secondary | ICD-10-CM | POA: Diagnosis not present

## 2017-01-09 DIAGNOSIS — H04123 Dry eye syndrome of bilateral lacrimal glands: Secondary | ICD-10-CM | POA: Diagnosis not present

## 2017-01-14 DEATH — deceased

## 2017-04-11 DIAGNOSIS — E782 Mixed hyperlipidemia: Secondary | ICD-10-CM | POA: Diagnosis not present

## 2017-04-11 DIAGNOSIS — C021 Malignant neoplasm of border of tongue: Secondary | ICD-10-CM | POA: Diagnosis not present

## 2017-04-11 DIAGNOSIS — E038 Other specified hypothyroidism: Secondary | ICD-10-CM | POA: Diagnosis not present

## 2017-04-11 DIAGNOSIS — I10 Essential (primary) hypertension: Secondary | ICD-10-CM | POA: Diagnosis not present

## 2017-05-02 DIAGNOSIS — J09X2 Influenza due to identified novel influenza A virus with other respiratory manifestations: Secondary | ICD-10-CM | POA: Diagnosis not present

## 2017-05-02 DIAGNOSIS — A09 Infectious gastroenteritis and colitis, unspecified: Secondary | ICD-10-CM | POA: Diagnosis not present

## 2017-08-10 DIAGNOSIS — I69328 Other speech and language deficits following cerebral infarction: Secondary | ICD-10-CM | POA: Diagnosis not present

## 2017-08-10 DIAGNOSIS — E782 Mixed hyperlipidemia: Secondary | ICD-10-CM | POA: Diagnosis not present

## 2017-08-10 DIAGNOSIS — Z85810 Personal history of malignant neoplasm of tongue: Secondary | ICD-10-CM | POA: Diagnosis not present

## 2017-08-10 DIAGNOSIS — G458 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: Secondary | ICD-10-CM | POA: Diagnosis not present

## 2017-08-10 DIAGNOSIS — I10 Essential (primary) hypertension: Secondary | ICD-10-CM | POA: Diagnosis not present

## 2017-08-10 DIAGNOSIS — E038 Other specified hypothyroidism: Secondary | ICD-10-CM | POA: Diagnosis not present

## 2017-11-20 DIAGNOSIS — Z23 Encounter for immunization: Secondary | ICD-10-CM | POA: Diagnosis not present

## 2017-11-27 DIAGNOSIS — H26491 Other secondary cataract, right eye: Secondary | ICD-10-CM | POA: Diagnosis not present

## 2017-11-27 DIAGNOSIS — H524 Presbyopia: Secondary | ICD-10-CM | POA: Diagnosis not present

## 2017-11-27 DIAGNOSIS — H35033 Hypertensive retinopathy, bilateral: Secondary | ICD-10-CM | POA: Diagnosis not present

## 2018-01-18 DIAGNOSIS — I69328 Other speech and language deficits following cerebral infarction: Secondary | ICD-10-CM | POA: Diagnosis not present

## 2018-01-18 DIAGNOSIS — Z6831 Body mass index (BMI) 31.0-31.9, adult: Secondary | ICD-10-CM | POA: Diagnosis not present

## 2018-01-18 DIAGNOSIS — R5383 Other fatigue: Secondary | ICD-10-CM | POA: Diagnosis not present

## 2018-01-18 DIAGNOSIS — E038 Other specified hypothyroidism: Secondary | ICD-10-CM | POA: Diagnosis not present

## 2018-01-18 DIAGNOSIS — E782 Mixed hyperlipidemia: Secondary | ICD-10-CM | POA: Diagnosis not present

## 2018-01-18 DIAGNOSIS — I10 Essential (primary) hypertension: Secondary | ICD-10-CM | POA: Diagnosis not present

## 2018-07-02 DIAGNOSIS — I69328 Other speech and language deficits following cerebral infarction: Secondary | ICD-10-CM | POA: Diagnosis not present

## 2018-07-02 DIAGNOSIS — C329 Malignant neoplasm of larynx, unspecified: Secondary | ICD-10-CM | POA: Diagnosis not present

## 2018-07-02 DIAGNOSIS — E038 Other specified hypothyroidism: Secondary | ICD-10-CM | POA: Diagnosis not present

## 2018-07-02 DIAGNOSIS — I10 Essential (primary) hypertension: Secondary | ICD-10-CM | POA: Diagnosis not present

## 2018-07-02 DIAGNOSIS — G458 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: Secondary | ICD-10-CM | POA: Diagnosis not present

## 2018-07-02 DIAGNOSIS — Z6830 Body mass index (BMI) 30.0-30.9, adult: Secondary | ICD-10-CM | POA: Diagnosis not present

## 2018-07-02 DIAGNOSIS — E782 Mixed hyperlipidemia: Secondary | ICD-10-CM | POA: Diagnosis not present

## 2018-07-02 DIAGNOSIS — Z1159 Encounter for screening for other viral diseases: Secondary | ICD-10-CM | POA: Diagnosis not present

## 2018-08-13 DIAGNOSIS — M545 Low back pain: Secondary | ICD-10-CM | POA: Diagnosis not present

## 2018-08-23 DIAGNOSIS — E038 Other specified hypothyroidism: Secondary | ICD-10-CM | POA: Diagnosis not present

## 2018-11-02 DIAGNOSIS — Z1159 Encounter for screening for other viral diseases: Secondary | ICD-10-CM | POA: Diagnosis not present

## 2018-11-02 DIAGNOSIS — E038 Other specified hypothyroidism: Secondary | ICD-10-CM | POA: Diagnosis not present

## 2018-11-02 DIAGNOSIS — I69328 Other speech and language deficits following cerebral infarction: Secondary | ICD-10-CM | POA: Diagnosis not present

## 2018-11-02 DIAGNOSIS — I10 Essential (primary) hypertension: Secondary | ICD-10-CM | POA: Diagnosis not present

## 2018-11-02 DIAGNOSIS — E782 Mixed hyperlipidemia: Secondary | ICD-10-CM | POA: Diagnosis not present

## 2018-11-02 DIAGNOSIS — Z85810 Personal history of malignant neoplasm of tongue: Secondary | ICD-10-CM | POA: Diagnosis not present

## 2018-11-02 DIAGNOSIS — G458 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: Secondary | ICD-10-CM | POA: Diagnosis not present

## 2018-11-02 DIAGNOSIS — Z23 Encounter for immunization: Secondary | ICD-10-CM | POA: Diagnosis not present

## 2018-11-02 DIAGNOSIS — Z6830 Body mass index (BMI) 30.0-30.9, adult: Secondary | ICD-10-CM | POA: Diagnosis not present

## 2018-12-14 DIAGNOSIS — H26491 Other secondary cataract, right eye: Secondary | ICD-10-CM | POA: Diagnosis not present

## 2019-03-05 DIAGNOSIS — Z1159 Encounter for screening for other viral diseases: Secondary | ICD-10-CM | POA: Diagnosis not present

## 2019-03-05 DIAGNOSIS — G2581 Restless legs syndrome: Secondary | ICD-10-CM | POA: Diagnosis not present

## 2019-03-05 DIAGNOSIS — E782 Mixed hyperlipidemia: Secondary | ICD-10-CM | POA: Diagnosis not present

## 2019-03-05 DIAGNOSIS — C329 Malignant neoplasm of larynx, unspecified: Secondary | ICD-10-CM | POA: Diagnosis not present

## 2019-03-05 DIAGNOSIS — I10 Essential (primary) hypertension: Secondary | ICD-10-CM | POA: Diagnosis not present

## 2019-03-05 DIAGNOSIS — E038 Other specified hypothyroidism: Secondary | ICD-10-CM | POA: Diagnosis not present

## 2019-03-05 DIAGNOSIS — I69328 Other speech and language deficits following cerebral infarction: Secondary | ICD-10-CM | POA: Diagnosis not present

## 2019-03-05 DIAGNOSIS — Z6832 Body mass index (BMI) 32.0-32.9, adult: Secondary | ICD-10-CM | POA: Diagnosis not present

## 2019-04-16 DIAGNOSIS — Z9889 Other specified postprocedural states: Secondary | ICD-10-CM | POA: Diagnosis not present

## 2019-04-16 DIAGNOSIS — D490 Neoplasm of unspecified behavior of digestive system: Secondary | ICD-10-CM | POA: Diagnosis not present

## 2019-04-16 DIAGNOSIS — Z85810 Personal history of malignant neoplasm of tongue: Secondary | ICD-10-CM | POA: Diagnosis not present

## 2019-04-16 DIAGNOSIS — J342 Deviated nasal septum: Secondary | ICD-10-CM | POA: Diagnosis not present

## 2019-04-16 DIAGNOSIS — Z9221 Personal history of antineoplastic chemotherapy: Secondary | ICD-10-CM | POA: Diagnosis not present

## 2019-04-16 DIAGNOSIS — Z8589 Personal history of malignant neoplasm of other organs and systems: Secondary | ICD-10-CM | POA: Diagnosis not present

## 2019-04-16 DIAGNOSIS — Z923 Personal history of irradiation: Secondary | ICD-10-CM | POA: Diagnosis not present

## 2019-04-29 DIAGNOSIS — C760 Malignant neoplasm of head, face and neck: Secondary | ICD-10-CM | POA: Diagnosis not present

## 2019-04-29 DIAGNOSIS — D490 Neoplasm of unspecified behavior of digestive system: Secondary | ICD-10-CM | POA: Diagnosis not present

## 2019-05-02 DIAGNOSIS — Z85810 Personal history of malignant neoplasm of tongue: Secondary | ICD-10-CM | POA: Diagnosis not present

## 2019-06-03 ENCOUNTER — Other Ambulatory Visit: Payer: Self-pay

## 2019-06-03 MED ORDER — FUROSEMIDE 20 MG PO TABS: 20.0000 mg | ORAL_TABLET | Freq: Every day | ORAL | 1 refills | Status: DC

## 2019-07-03 ENCOUNTER — Encounter: Payer: Self-pay | Admitting: Legal Medicine

## 2019-07-03 ENCOUNTER — Ambulatory Visit: Payer: Medicare PPO | Admitting: Legal Medicine

## 2019-07-03 DIAGNOSIS — R5383 Other fatigue: Secondary | ICD-10-CM | POA: Insufficient documentation

## 2019-07-03 DIAGNOSIS — E785 Hyperlipidemia, unspecified: Secondary | ICD-10-CM | POA: Insufficient documentation

## 2019-07-03 DIAGNOSIS — E782 Mixed hyperlipidemia: Secondary | ICD-10-CM | POA: Insufficient documentation

## 2019-07-03 DIAGNOSIS — G2581 Restless legs syndrome: Secondary | ICD-10-CM | POA: Insufficient documentation

## 2019-07-03 DIAGNOSIS — G458 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: Secondary | ICD-10-CM | POA: Insufficient documentation

## 2019-07-03 DIAGNOSIS — E038 Other specified hypothyroidism: Secondary | ICD-10-CM | POA: Insufficient documentation

## 2019-07-05 ENCOUNTER — Other Ambulatory Visit: Payer: Self-pay

## 2019-07-05 ENCOUNTER — Ambulatory Visit (INDEPENDENT_AMBULATORY_CARE_PROVIDER_SITE_OTHER): Payer: Medicare PPO | Admitting: Legal Medicine

## 2019-07-05 ENCOUNTER — Encounter: Payer: Self-pay | Admitting: Legal Medicine

## 2019-07-05 VITALS — BP 140/86 | HR 68 | Temp 97.9°F | Resp 16 | Ht 65.0 in | Wt 198.8 lb

## 2019-07-05 DIAGNOSIS — E038 Other specified hypothyroidism: Secondary | ICD-10-CM

## 2019-07-05 DIAGNOSIS — E782 Mixed hyperlipidemia: Secondary | ICD-10-CM

## 2019-07-05 DIAGNOSIS — Z6833 Body mass index (BMI) 33.0-33.9, adult: Secondary | ICD-10-CM

## 2019-07-05 DIAGNOSIS — I6522 Occlusion and stenosis of left carotid artery: Secondary | ICD-10-CM

## 2019-07-05 DIAGNOSIS — G459 Transient cerebral ischemic attack, unspecified: Secondary | ICD-10-CM | POA: Diagnosis not present

## 2019-07-05 DIAGNOSIS — I10 Essential (primary) hypertension: Secondary | ICD-10-CM

## 2019-07-05 DIAGNOSIS — G2581 Restless legs syndrome: Secondary | ICD-10-CM | POA: Diagnosis not present

## 2019-07-05 MED ORDER — FUROSEMIDE 20 MG PO TABS: 20.0000 mg | ORAL_TABLET | Freq: Every day | ORAL | 2 refills | Status: DC

## 2019-07-05 NOTE — Progress Notes (Signed)
Established Patient Office Visit  Subjective:  Patient ID: James Richards, male    DOB: 10-17-1945  Age: 74 y.o. MRN: [id number]  CC:  Chief Complaint  Patient presents with  . Hyperlipidemia  . Hypothyroidism  . Hypertension    HPI James Richards presents for chronic visit.  Patient presents for follow up of hypertension.  Patient tolerating amlodipine well with side effects.  Patient was diagnosed with hypertension 2010 so has been treated for hypertension for 10 years.Patient is working on maintaining diet and exercise regimen and follows up as directed. Complication include none.  Patient presents with hyperlipidemia.  Compliance with treatment has been good; patient takes medicines as directed, maintains low cholesterol diet, follows up as directed, and maintains exercise regimen.  Patient is using atorvastatin without problems.  Patient has HYPOTHYROIDISM.  Diagnosed 10 years ago.  Patient has stable thyroid readings.  Patient is having tired.  Last TSH was .418  conrinue dosage of thyroid medicine.  oral cancer stable no recurrence.  Past Medical History:  Diagnosis Date  . Cancer [facility]) Jun 30, 2007   Throat , Tongue  . Carotid artery occlusion    throat  . Essential hypertension   . Malignant neoplasm of larynx, unspecified ([place])   . Mixed hyperlipidemia   . Other fatigue   . Other specified hypothyroidism   . 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   . Restless legs syndrome   . Stroke [facility]) 1987 , 2014    Past Surgical History:  Procedure Laterality Date  . LYMPHADENECTOMY    . PERIPHERAL VASCULAR CATHETERIZATION N/A 11/24/2014   Procedure: Aortic Arch Angiography;  Surgeon: Angelia Mould, MD;  Location: [place] [facility];  Service: Cardiovascular;  Laterality: N/A;  . PERIPHERAL VASCULAR CATHETERIZATION N/A 11/24/2014   Procedure: Cerebral Angiography;  Surgeon: Angelia Mould, MD;  Location: [place] [facility];   Service: Cardiovascular;  Laterality: N/A;  . THROAT SURGERY    . TONSILECTOMY/ADENOIDECTOMY WITH MYRINGOTOMY      Family History  Problem Relation Age of Onset  . Other Father        amputation  . Deep vein thrombosis Father   . Heart disease Father        Atrial Fib.  . Cancer Sister   . Hypertension Brother     Social History   Socioeconomic History  . Marital status: Divorced    Spouse name: Not on file  . Number of children: Not on file  . Years of education: Not on file  . Highest education level: Not on file  Occupational History  . Not on file  Tobacco Use  . Smoking status: Former Smoker    Types: Cigarettes    Quit date: 02/15/2007    Years since quitting: 12.3  . Smokeless tobacco: Former Systems developer    Types: Chew  Substance and Sexual Activity  . Alcohol use: No    Alcohol/week: 0.0 standard drinks  . Drug use: No  . Sexual activity: Not on file  Other Topics Concern  . Not on file  Social History Narrative  . Not on file   Social Determinants of Health   Financial Resource Strain:   . Difficulty of Paying Living Expenses:   Food Insecurity:   . Worried About Charity fundraiser in the Last Year:   . Arboriculturist in the Last Year:   Transportation Needs:   . Film/video editor (Medical):   Marland Kitchen  Lack of Transportation (Non-Medical):   Physical Activity:   . Days of Exercise per Week:   . Minutes of Exercise per Session:   Stress:   . Feeling of Stress :   Social Connections:   . Frequency of Communication with Friends and Family:   . Frequency of Social Gatherings with Friends and Family:   . Attends Religious Services:   . Active Member of Clubs or Organizations:   . Attends Archivist Meetings:   Marland Kitchen Marital Status:   Intimate Partner Violence:   . Fear of Current or Ex-Partner:   . Emotionally Abused:   Marland Kitchen Physically Abused:   . Sexually Abused:     Outpatient Medications Prior to Visit  Medication Sig Dispense Refill  .  amLODipine (NORVASC) 2.5 MG tablet Take 1 tablet by mouth daily.    Marland Kitchen apixaban (ELIQUIS) 5 MG TABS tablet Take 5 mg by mouth 2 (two) times daily.     Marland Kitchen aspirin 325 MG tablet Take 1 tablet (325 mg total) by mouth daily. 30 tablet 3  . clopidogrel (PLAVIX) 75 MG tablet Take 1 tablet (75 mg total) by mouth daily. 30 tablet 2  . levothyroxine (SYNTHROID, LEVOTHROID) 112 MCG tablet Take 1 tablet by mouth daily.    . minocycline (MINOCIN,DYNACIN) 50 MG capsule Take 1 capsule by mouth daily.    . Omega-3 Fatty Acids (FISH OIL) 1200 MG CPDR Take 1 capsule by mouth daily.    Marland Kitchen omeprazole (PRILOSEC) 40 MG capsule Take 1 capsule by mouth daily.    Marland Kitchen rOPINIRole (REQUIP) 0.5 MG tablet     . furosemide (LASIX) 20 MG tablet Take 1 tablet (20 mg total) by mouth daily. 90 tablet 1  . atorvastatin (LIPITOR) 80 MG tablet Take 80 mg by mouth daily at 6 PM.     . Multiple Vitamin (MULTIVITAMIN WITH MINERALS) TABS tablet Take 1 tablet by mouth daily.     No facility-administered medications prior to visit.    No Known Allergies  ROS Review of Systems  Constitutional: Positive for fatigue.  HENT: Negative.   Eyes: Negative.   Respiratory: Negative.   Cardiovascular: Negative.   Gastrointestinal: Negative.   Endocrine: Negative.   Skin: Negative.   Neurological: Negative.   Psychiatric/Behavioral: Negative.       Objective:    Physical Exam  Constitutional: He is oriented to person, place, and time. He appears well-developed and well-nourished.  HENT:  Head: Normocephalic and atraumatic.  Right Ear: External ear normal.  Left Ear: External ear normal.  Nose: Nose normal.  Mouth/Throat: Oropharynx is clear and moist.  Eyes: Pupils are equal, round, and reactive to light. Conjunctivae and EOM are normal.  Cardiovascular: Normal rate, regular rhythm, normal heart sounds and intact distal pulses.  Pulmonary/Chest: Effort normal and breath sounds normal.  Abdominal: Soft. Bowel sounds are normal.    Musculoskeletal:        General: Normal range of motion.     Cervical back: Normal range of motion and neck supple.  Neurological: He is alert and oriented to person, place, and time. He has normal reflexes.  Skin: Skin is warm and dry.  Psychiatric: He has a normal mood and affect. His behavior is normal. Judgment and thought content normal.  Vitals reviewed.   BP 140/86   Pulse 68   Temp 97.9 F (36.6 C)   Resp 16   Ht 5\' 5"  (1.651 m)   Wt 198 lb 12.8 oz (90.2 kg)  SpO2 96%   BMI 33.08 kg/m  Wt Readings from Last 3 Encounters:  07/05/19 198 lb 12.8 oz (90.2 kg)  12/02/14 195 lb (88.5 kg)  11/24/14 195 lb (88.5 kg)     Health Maintenance Due  Topic Date Due  . Hepatitis C Screening  Never done  . COVID-19 Vaccine (1) Never done  . TETANUS/TDAP  Never done  . COLONOSCOPY  Never done  . PNA vac Low Risk Adult (1 of 2 - PCV13) Never done    There are no preventive care reminders to display for this patient.  Lab Results  Component Value Date   TSH 2.140 07/05/2019   Lab Results  Component Value Date   WBC 5.3 07/05/2019   HGB 15.0 07/05/2019   HCT 44.4 07/05/2019   MCV 93 07/05/2019   PLT 244 07/05/2019   Lab Results  Component Value Date   NA 142 07/05/2019   K 4.5 07/05/2019   CO2 26 07/05/2019   GLUCOSE 101 (H) 07/05/2019   BUN 19 07/05/2019   CREATININE 1.18 07/05/2019   BILITOT 0.4 07/05/2019   ALKPHOS 68 07/05/2019   AST 23 07/05/2019   ALT 25 07/05/2019   PROT 6.4 07/05/2019   ALBUMIN 4.2 07/05/2019   CALCIUM 9.6 07/05/2019   ANIONGAP 10 11/25/2014   Lab Results  Component Value Date   CHOL 110 07/05/2019   Lab Results  Component Value Date   HDL 45 07/05/2019   Lab Results  Component Value Date   LDLCALC 52 07/05/2019   Lab Results  Component Value Date   TRIG 58 07/05/2019   Lab Results  Component Value Date   CHOLHDL 2.4 07/05/2019   Lab Results  Component Value Date   HGBA1C 6.6 (H) 11/25/2014      Assessment &  Plan:   Problem List Items Addressed This Visit      Cardiovascular and Mediastinum   Carotid stenosis    This is being watched.      Relevant Medications   furosemide (LASIX) 20 MG tablet   TIA (transient ischemic attack)    Patient has history of TIAs but has been stable and no new episodes      Relevant Medications   furosemide (LASIX) 20 MG tablet   Essential hypertension    An individual hypertension care plan was established and reinforced today.  The patient's status was assessed using clinical findings on exam and labs or diagnostic tests. The patient's success at meeting treatment goals on disease specific evidence-based guidelines and found to be well controlled. SELF MANAGEMENT: The patient and I together assessed ways to personally work towards obtaining the recommended goals. RECOMMENDATIONS: avoid decongestants found in common cold remedies, decrease consumption of alcohol, perform routine monitoring of BP with home BP cuff, exercise, reduction of dietary salt, take medicines as prescribed, try not to miss doses and quit smoking.  Regular exercise and maintaining a healthy weight is needed.  Stress reduction may help. A CLINICAL SUMMARY including written plan identify barriers to care unique to individual due to social or financial issues.  We attempt to mutually creat solutions for individual and family understanding.      Relevant Medications   furosemide (LASIX) 20 MG tablet   Other Relevant Orders   CBC with Differential/Platelet (Completed)   Comprehensive metabolic panel (Completed)     Endocrine   Other specified hypothyroidism - Primary    Patient is known to have hypothyroidism and is n treatment with 128mcg.  Patient was diagnosed 10 years ago.  Other treatment includes none.  Patient is compliant with medicines and last TSH 6 months ago.  Last TSH was .418.      Relevant Orders   TSH (Completed)     Other   Mixed hyperlipidemia    AN INDIVIDUAL CARE  PLAN for hyperlipidemia/ cholesterol was established and reinforced today.  The patient's status was assessed using clinical findings on exam, lab and other diagnostic tests. The patient's disease status was assessed based on evidence-based guidelines and found to be well controlled. MEDICATIONS were reviewed. SELF MANAGEMENT GOALS have been discussed and patient's success at attaining the goal of low cholesterol was assessed. RECOMMENDATION given include regular exercise 3 days a week and low cholesterol/low fat diet. CLINICAL SUMMARY including written plan to identify barriers unique to the patient due to social or economic  reasons was discussed.      Relevant Medications   furosemide (LASIX) 20 MG tablet   Other Relevant Orders   Lipid panel (Completed)   Restless legs syndrome    Patient is stable with his restless legs      BMI 33.0-33.9,adult    An individualize plan was formulated for obesity using patient history and physical exam to encourage weight loss.  An evidence based program was formulated.  Patient is to cut portion size with meals and to plan physical exercise 3 days a week at least 20 minutes.  Weight watchers and other programs are helpful.  Planned amount of weight loss 10 lbs.         Meds ordered this encounter  Medications  . furosemide (LASIX) 20 MG tablet    Sig: Take 1 tablet (20 mg total) by mouth daily.    Dispense:  90 tablet    Refill:  2    Follow-up: Return in about 3 months (around 10/05/2019) for fasting.    Reinaldo Meeker, MD

## 2019-07-06 LAB — CBC WITH DIFFERENTIAL/PLATELET
Basophils Absolute: 0.1 10*3/uL (ref 0.0–0.2)
Basos: 1 %
EOS (ABSOLUTE): 0.2 10*3/uL (ref 0.0–0.4)
Eos: 3 %
Hematocrit: 44.4 % (ref 37.5–51.0)
Hemoglobin: 15 g/dL (ref 13.0–17.7)
Immature Grans (Abs): 0 10*3/uL (ref 0.0–0.1)
Immature Granulocytes: 0 %
Lymphocytes Absolute: 0.7 10*3/uL (ref 0.7–3.1)
Lymphs: 14 %
MCH: 31.5 pg (ref 26.6–33.0)
MCHC: 33.8 g/dL (ref 31.5–35.7)
MCV: 93 fL (ref 79–97)
Monocytes Absolute: 0.6 10*3/uL (ref 0.1–0.9)
Monocytes: 11 %
Neutrophils Absolute: 3.7 10*3/uL (ref 1.4–7.0)
Neutrophils: 71 %
Platelets: 244 10*3/uL (ref 150–450)
RBC: 4.76 x10E6/uL (ref 4.14–5.80)
RDW: 12.9 % (ref 11.6–15.4)
WBC: 5.3 10*3/uL (ref 3.4–10.8)

## 2019-07-06 LAB — COMPREHENSIVE METABOLIC PANEL
ALT: 25 IU/L (ref 0–44)
AST: 23 IU/L (ref 0–40)
Albumin/Globulin Ratio: 1.9 (ref 1.2–2.2)
Albumin: 4.2 g/dL (ref 3.7–4.7)
Alkaline Phosphatase: 68 IU/L (ref 48–121)
BUN/Creatinine Ratio: 16 (ref 10–24)
BUN: 19 mg/dL (ref 8–27)
Bilirubin Total: 0.4 mg/dL (ref 0.0–1.2)
CO2: 26 mmol/L (ref 20–29)
Calcium: 9.6 mg/dL (ref 8.6–10.2)
Chloride: 103 mmol/L (ref 96–106)
Creatinine, Ser: 1.18 mg/dL (ref 0.76–1.27)
GFR calc Af Amer: 71 mL/min/{1.73_m2} (ref >59)
GFR calc non Af Amer: 61 mL/min/{1.73_m2} (ref >59)
Globulin, Total: 2.2 g/dL (ref 1.5–4.5)
Glucose: 101 mg/dL — ABNORMAL HIGH (ref 65–99)
Potassium: 4.5 mmol/L (ref 3.5–5.2)
Sodium: 142 mmol/L (ref 134–144)
Total Protein: 6.4 g/dL (ref 6.0–8.5)

## 2019-07-06 LAB — TSH: TSH: 2.14 u[IU]/mL (ref 0.450–4.500)

## 2019-07-06 LAB — CARDIOVASCULAR RISK ASSESSMENT

## 2019-07-06 LAB — LIPID PANEL
Chol/HDL Ratio: 2.4 ratio (ref 0.0–5.0)
Cholesterol, Total: 110 mg/dL (ref 100–199)
HDL: 45 mg/dL (ref >39)
LDL Chol Calc (NIH): 52 mg/dL (ref 0–99)
Triglycerides: 58 mg/dL (ref 0–149)
VLDL Cholesterol Cal: 13 mg/dL (ref 5–40)

## 2019-07-06 NOTE — Progress Notes (Signed)
Cbc all normal, no anemia, glucose 101, kidney and liver tests normal, TSH 2/14 normal, Cholesterol normal lp

## 2019-07-07 DIAGNOSIS — Z6830 Body mass index (BMI) 30.0-30.9, adult: Secondary | ICD-10-CM | POA: Insufficient documentation

## 2019-07-07 DIAGNOSIS — Z6833 Body mass index (BMI) 33.0-33.9, adult: Secondary | ICD-10-CM | POA: Insufficient documentation

## 2019-07-07 HISTORY — DX: Body mass index (BMI) 33.0-33.9, adult: Z68.33

## 2019-07-07 NOTE — Assessment & Plan Note (Signed)

## 2019-07-07 NOTE — Assessment & Plan Note (Signed)
This is being watched.

## 2019-07-07 NOTE — Assessment & Plan Note (Signed)
Patient is stable with his restless legs

## 2019-07-07 NOTE — Assessment & Plan Note (Signed)

## 2019-07-07 NOTE — Assessment & Plan Note (Signed)
Patient has history of TIAs but has been stable and no new episodes

## 2019-07-07 NOTE — Assessment & Plan Note (Signed)
Patient is known to have hypothyroidism and is n treatment with 147mcg.  Patient was diagnosed 10 years ago.  Other treatment includes none.  Patient is compliant with medicines and last TSH 6 months ago.  Last TSH was .418.

## 2019-07-07 NOTE — Assessment & Plan Note (Signed)
An individualize plan was formulated for obesity using patient history and physical exam to encourage weight loss.  An evidence based program was formulated.  Patient is to cut portion size with meals and to plan physical exercise 3 days a week at least 20 minutes.  Weight watchers and other programs are helpful.  Planned amount of weight loss 10 lbs. 

## 2019-07-09 ENCOUNTER — Other Ambulatory Visit: Payer: Self-pay | Admitting: Legal Medicine

## 2019-10-07 ENCOUNTER — Other Ambulatory Visit: Payer: Self-pay | Admitting: Legal Medicine

## 2019-10-07 DIAGNOSIS — E782 Mixed hyperlipidemia: Secondary | ICD-10-CM

## 2019-10-07 DIAGNOSIS — G459 Transient cerebral ischemic attack, unspecified: Secondary | ICD-10-CM

## 2019-10-08 ENCOUNTER — Ambulatory Visit (INDEPENDENT_AMBULATORY_CARE_PROVIDER_SITE_OTHER): Payer: Medicare PPO | Admitting: Legal Medicine

## 2019-10-08 ENCOUNTER — Encounter: Payer: Self-pay | Admitting: Legal Medicine

## 2019-10-08 ENCOUNTER — Other Ambulatory Visit: Payer: Self-pay

## 2019-10-08 VITALS — BP 150/96 | HR 65 | Temp 97.4°F | Resp 16 | Ht 65.0 in | Wt 199.8 lb

## 2019-10-08 DIAGNOSIS — I10 Essential (primary) hypertension: Secondary | ICD-10-CM

## 2019-10-08 DIAGNOSIS — R0609 Other forms of dyspnea: Secondary | ICD-10-CM

## 2019-10-08 DIAGNOSIS — G2581 Restless legs syndrome: Secondary | ICD-10-CM | POA: Diagnosis not present

## 2019-10-08 DIAGNOSIS — E038 Other specified hypothyroidism: Secondary | ICD-10-CM | POA: Diagnosis not present

## 2019-10-08 DIAGNOSIS — Z6833 Body mass index (BMI) 33.0-33.9, adult: Secondary | ICD-10-CM

## 2019-10-08 DIAGNOSIS — R0600 Dyspnea, unspecified: Secondary | ICD-10-CM | POA: Diagnosis not present

## 2019-10-08 DIAGNOSIS — I70219 Atherosclerosis of native arteries of extremities with intermittent claudication, unspecified extremity: Secondary | ICD-10-CM

## 2019-10-08 DIAGNOSIS — I6522 Occlusion and stenosis of left carotid artery: Secondary | ICD-10-CM

## 2019-10-08 DIAGNOSIS — E782 Mixed hyperlipidemia: Secondary | ICD-10-CM | POA: Diagnosis not present

## 2019-10-08 MED ORDER — FUROSEMIDE 20 MG PO TABS: 20.0000 mg | ORAL_TABLET | Freq: Every day | ORAL | 1 refills | Status: DC

## 2019-10-08 NOTE — Progress Notes (Signed)
Subjective:  Patient ID: James Richards, male    DOB: 02-Mar-1945  Age: 74 y.o. MRN: [id number]  Chief Complaint  Patient presents with  . Hypothyroidism  . Hypertension  . Hyperlipidemia    HPI: Chronic visit  Patient has HYPOTHYROIDISM.  Diagnosed 10 years ago.  Patient has stable thyroid readings.  Patient is having no syptoms.  Last TSH was normal.  continue dosage of thyroid medicine.  Patient presents for follow up of hypertension.  Patient tolerating amlodipine well with side effects.  Patient was diagnosed with hypertension 2010 so has been treated for hypertension for 10 years.Patient is working on maintaining diet and exercise regimen and follows up as directed. Complication include cad.  Patient presents with hyperlipidemia.  Compliance with treatment has been good; patient takes medicines as directed, maintains low cholesterol diet, follows up as directed, and maintains exercise regimen.  Patient is using atorvastain without problems.   Current Outpatient Medications on File Prior to Visit  Medication Sig Dispense Refill  . amLODipine (NORVASC) 2.5 MG tablet TAKE 1 TABLET EVERY DAY 90 tablet 2  . apixaban (ELIQUIS) 5 MG TABS tablet Take 5 mg by mouth 2 (two) times daily.     Marland Kitchen aspirin 325 MG tablet Take 1 tablet (325 mg total) by mouth daily. 30 tablet 3  . atorvastatin (LIPITOR) 80 MG tablet TAKE 1 TABLET EVERY DAY 90 tablet 2  . clopidogrel (PLAVIX) 75 MG tablet TAKE 1 TABLET EVERY DAY 90 tablet 2  . furosemide (LASIX) 20 MG tablet Take 1 tablet (20 mg total) by mouth daily. 90 tablet 2  . levothyroxine (SYNTHROID) 100 MCG tablet     . minocycline (MINOCIN) 50 MG capsule TAKE 1 CAPSULE EVERY 12 HOURS 180 capsule 2  . Omega-3 Fatty Acids (FISH OIL) 1200 MG CPDR Take 1 capsule by mouth daily.    Marland Kitchen omeprazole (PRILOSEC) 40 MG capsule Take 1 capsule by mouth daily.    Marland Kitchen rOPINIRole (REQUIP) 0.5 MG tablet      No current facility-administered medications on file prior to  visit.   Past Medical History:  Diagnosis Date  . Essential hypertension   . Malignant neoplasm of larynx, unspecified ([place])   . Mixed hyperlipidemia   . Other fatigue   . Other specified hypothyroidism   . 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   . Restless legs syndrome    Past Surgical History:  Procedure Laterality Date  . LYMPHADENECTOMY    . PERIPHERAL VASCULAR CATHETERIZATION N/A 11/24/2014   Procedure: Aortic Arch Angiography;  Surgeon: Angelia Mould, MD;  Location: [facility];  Service: Cardiovascular;  Laterality: N/A;  . PERIPHERAL VASCULAR CATHETERIZATION N/A 11/24/2014   Procedure: Cerebral Angiography;  Surgeon: Angelia Mould, MD;  Location: [facility];  Service: Cardiovascular;  Laterality: N/A;  . THROAT SURGERY    . TONSILECTOMY/ADENOIDECTOMY WITH MYRINGOTOMY      Family History  Problem Relation Age of Onset  . Other Father        amputation  . Deep vein thrombosis Father   . Heart disease Father        Atrial Fib.  . Cancer Sister   . Hypertension Brother    Social History   Socioeconomic History  . Marital status: Divorced    Spouse name: Not on file  . Number of children: Not on file  . Years of education: Not on file  . Highest education level: Not on file  Occupational  History  . Not on file  Tobacco Use  . Smoking status: Former Smoker    Types: Cigarettes    Quit date: 02/15/2007    Years since quitting: 12.6  . Smokeless tobacco: Former Systems developer    Types: Secondary school teacher  . Vaping Use: Never used  Substance and Sexual Activity  . Alcohol use: No    Alcohol/week: 0.0 standard drinks  . Drug use: No  . Sexual activity: Not on file  Other Topics Concern  . Not on file  Social History Narrative  . Not on file   Social Determinants of Health   Financial Resource Strain:   . Difficulty of Paying Living Expenses: Not on file  Food Insecurity:   . Worried About Charity fundraiser  in the Last Year: Not on file  . Ran Out of Food in the Last Year: Not on file  Transportation Needs:   . Lack of Transportation (Medical): Not on file  . Lack of Transportation (Non-Medical): Not on file  Physical Activity:   . Days of Exercise per Week: Not on file  . Minutes of Exercise per Session: Not on file  Stress:   . Feeling of Stress : Not on file  Social Connections:   . Frequency of Communication with Friends and Family: Not on file  . Frequency of Social Gatherings with Friends and Family: Not on file  . Attends Religious Services: Not on file  . Active Member of Clubs or Organizations: Not on file  . Attends Archivist Meetings: Not on file  . Marital Status: Not on file    Review of Systems  Constitutional: Negative.   HENT: Negative.   Eyes: Negative.   Respiratory: Positive for cough and shortness of breath.   Cardiovascular: Positive for leg swelling.  Gastrointestinal: Negative.   Endocrine: Negative.   Genitourinary: Negative.   Musculoskeletal: Negative.   Skin: Negative.   Neurological: Negative.   Psychiatric/Behavioral: Negative.      Objective:  BP (!) 150/96   Pulse 65   Temp (!) 97.4 F (36.3 C)   Resp 16   Ht 5\' 5"  (1.651 m)   Wt 199 lb 12.8 oz (90.6 kg)   SpO2 99%   BMI 33.25 kg/m   BP/Weight 10/08/2019 07/05/2019 80/88/1103  Systolic BP 159 458 592  Diastolic BP 96 86 75  Wt. (Lbs) 199.8 198.8 195  BMI 33.25 33.08 32.45    Physical Exam Vitals reviewed.  Constitutional:      Appearance: Normal appearance.  HENT:     Head: Normocephalic and atraumatic.     Right Ear: Tympanic membrane normal.     Left Ear: Tympanic membrane normal.     Nose: Nose normal.     Mouth/Throat:     Mouth: Mucous membranes are moist.  Eyes:     Extraocular Movements: Extraocular movements intact.     Conjunctiva/sclera: Conjunctivae normal.     Pupils: Pupils are equal, round, and reactive to light.  Cardiovascular:     Rate and  Rhythm: Normal rate and regular rhythm.     Pulses: Normal pulses.     Heart sounds: Normal heart sounds.  Pulmonary:     Effort: Pulmonary effort is normal.     Breath sounds: Normal breath sounds.  Abdominal:     General: Abdomen is flat. Bowel sounds are normal.     Palpations: Abdomen is soft.  Musculoskeletal:        General: Normal  range of motion.     Cervical back: Normal range of motion and neck supple.  Skin:    General: Skin is warm and dry.     Capillary Refill: Capillary refill takes less than 2 seconds.  Neurological:     General: No focal deficit present.     Mental Status: He is alert and oriented to person, place, and time. Mental status is at baseline.  Psychiatric:        Mood and Affect: Mood normal.        Thought Content: Thought content normal.        Judgment: Judgment normal.       Lab Results  Component Value Date   WBC 5.3 07/05/2019   HGB 15.0 07/05/2019   HCT 44.4 07/05/2019   PLT 244 07/05/2019   GLUCOSE 101 (H) 07/05/2019   CHOL 110 07/05/2019   TRIG 58 07/05/2019   HDL 45 07/05/2019   LDLCALC 52 07/05/2019   ALT 25 07/05/2019   AST 23 07/05/2019   NA 142 07/05/2019   K 4.5 07/05/2019   CL 103 07/05/2019   CREATININE 1.18 07/05/2019   BUN 19 07/05/2019   CO2 26 07/05/2019   TSH 2.140 07/05/2019   HGBA1C 6.6 (H) 11/25/2014      Assessment & Plan:   1. Other specified hypothyroidism Patient is known to have hypothyroidism and is n treatment with levothyroxine 133mcg.  Patient was diagnosed 10 years ago.  Other treatment includes none.  Patient is compliant with medicines and last TSH 6 months ago.  Last TSH was normal.  2. Essential hypertension - CBC with Differential/Platelet - Comprehensive metabolic panel An individual hypertension care plan was established and reinforced today.  The patient's status was assessed using clinical findings on exam and labs or diagnostic tests. The patient's success at meeting treatment goals on  disease specific evidence-based guidelines and found to be well controlled. SELF MANAGEMENT: The patient and I together assessed ways to personally work towards obtaining the recommended goals. RECOMMENDATIONS: avoid decongestants found in common cold remedies, decrease consumption of alcohol, perform routine monitoring of BP with home BP cuff, exercise, reduction of dietary salt, take medicines as prescribed, try not to miss doses and quit smoking.  Regular exercise and maintaining a healthy weight is needed.  Stress reduction may help. A CLINICAL SUMMARY including written plan identify barriers to care unique to individual due to social or financial issues.  We attempt to mutually creat solutions for individual and family understanding.  3. Mixed hyperlipidemia - Lipid panel AN INDIVIDUAL CARE PLAN for hyperlipidemia/ cholesterol was established and reinforced today.  The patient's status was assessed using clinical findings on exam, lab and other diagnostic tests. The patient's disease status was assessed based on evidence-based guidelines and found to be well controlled. MEDICATIONS were reviewed. SELF MANAGEMENT GOALS have been discussed and patient's success at attaining the goal of low cholesterol was assessed. RECOMMENDATION given include regular exercise 3 days a week and low cholesterol/low fat diet. CLINICAL SUMMARY including written plan to identify barriers unique to the patient due to social or economic  reasons was discussed.  4. Restless legs syndrome AN INDIVIDUAL CARE PLAN was established and reinforced today.  The patient's status was assessed using clinical findings on exam, labs, and other diagnostic testing. Patient's success at meeting treatment goals based on disease specific evidence-bassed guidelines and found to be in good control. RECOMMENDATIONS include maintain present medicines and treatment.  5. Stenosis of left carotid  artery Stable , no problems or known TIAs  6.  Atherosclerotic peripheral vascular disease with intermittent claudication (HCC) AN INDIVIDUAL CARE PLAN vascular disease was established and reinforced today.  The patient's status was assessed using clinical findings on exam, labs, and other diagnostic testing. Patient's success at meeting treatment goals based on disease specific evidence-bassed guidelines and found to be in fair control. RECOMMENDATIONS include maintaining present medicines and treatment.  7. BMI 33.0-33.9,adult An individualize plan was formulated for obesity using patient history and physical exam to encourage weight loss.  An evidence based program was formulated.  Patient is to cut portion size with meals and to plan physical exercise 3 days a week at least 20 minutes.  Weight watchers and other programs are helpful.  Planned amount of weight loss 10 lbs.  8. Dyspnea on exertion - Brain natriuretic peptide - furosemide (LASIX) 20 MG tablet; Take 1 tablet (20 mg total) by mouth daily.  Dispense: 30 tablet; Refill: 1 Patient having progressive DOE and poor exercise tolerance, no chest pain.  He has been out of his furosemide for one month.  I will restart , if not improving, needs cardiac workup    Meds ordered this encounter  Medications  . DISCONTD: furosemide (LASIX) 20 MG tablet    Sig: Take 1 tablet (20 mg total) by mouth daily.    Dispense:  30 tablet    Refill:  1  . furosemide (LASIX) 20 MG tablet    Sig: Take 1 tablet (20 mg total) by mouth daily.    Dispense:  30 tablet    Refill:  1    Orders Placed This Encounter  Procedures  . CBC with Differential/Platelet  . Comprehensive metabolic panel  . Lipid panel  . Brain natriuretic peptide     Follow-up: Return in about 4 months (around 02/07/2020), or for lesion removl nose asap, for fasting.  An After Visit Summary was printed and given to the patient.  [place] [phone]

## 2019-10-09 LAB — CBC WITH DIFFERENTIAL/PLATELET
Basophils Absolute: 0 10*3/uL (ref 0.0–0.2)
Basos: 1 %
EOS (ABSOLUTE): 0.3 10*3/uL (ref 0.0–0.4)
Eos: 4 %
Hematocrit: 41.4 % (ref 37.5–51.0)
Hemoglobin: 14.2 g/dL (ref 13.0–17.7)
Immature Grans (Abs): 0 10*3/uL (ref 0.0–0.1)
Immature Granulocytes: 0 %
Lymphocytes Absolute: 1 10*3/uL (ref 0.7–3.1)
Lymphs: 16 %
MCH: 31.8 pg (ref 26.6–33.0)
MCHC: 34.3 g/dL (ref 31.5–35.7)
MCV: 93 fL (ref 79–97)
Monocytes Absolute: 0.6 10*3/uL (ref 0.1–0.9)
Monocytes: 10 %
Neutrophils Absolute: 4.4 10*3/uL (ref 1.4–7.0)
Neutrophils: 69 %
Platelets: 273 10*3/uL (ref 150–450)
RBC: 4.47 x10E6/uL (ref 4.14–5.80)
RDW: 12.9 % (ref 11.6–15.4)
WBC: 6.4 10*3/uL (ref 3.4–10.8)

## 2019-10-09 LAB — COMPREHENSIVE METABOLIC PANEL
ALT: 25 IU/L (ref 0–44)
AST: 25 IU/L (ref 0–40)
Albumin/Globulin Ratio: 1.7 (ref 1.2–2.2)
Albumin: 4 g/dL (ref 3.7–4.7)
Alkaline Phosphatase: 72 IU/L (ref 48–121)
BUN/Creatinine Ratio: 18 (ref 10–24)
BUN: 18 mg/dL (ref 8–27)
Bilirubin Total: 0.3 mg/dL (ref 0.0–1.2)
CO2: 27 mmol/L (ref 20–29)
Calcium: 9.5 mg/dL (ref 8.6–10.2)
Chloride: 101 mmol/L (ref 96–106)
Creatinine, Ser: 1.02 mg/dL (ref 0.76–1.27)
GFR calc Af Amer: 84 mL/min/{1.73_m2} (ref >59)
GFR calc non Af Amer: 73 mL/min/{1.73_m2} (ref >59)
Globulin, Total: 2.4 g/dL (ref 1.5–4.5)
Glucose: 94 mg/dL (ref 65–99)
Potassium: 4.8 mmol/L (ref 3.5–5.2)
Sodium: 138 mmol/L (ref 134–144)
Total Protein: 6.4 g/dL (ref 6.0–8.5)

## 2019-10-09 LAB — BRAIN NATRIURETIC PEPTIDE: BNP: 31.4 pg/mL (ref 0.0–100.0)

## 2019-10-09 LAB — LIPID PANEL
Chol/HDL Ratio: 2.3 ratio (ref 0.0–5.0)
Cholesterol, Total: 122 mg/dL (ref 100–199)
HDL: 52 mg/dL (ref >39)
LDL Chol Calc (NIH): 57 mg/dL (ref 0–99)
Triglycerides: 57 mg/dL (ref 0–149)
VLDL Cholesterol Cal: 13 mg/dL (ref 5–40)

## 2019-10-09 LAB — CARDIOVASCULAR RISK ASSESSMENT

## 2019-10-09 NOTE — Progress Notes (Signed)
Cbc normal, kidney and liver tests normal, Cholesterol normal, BNP 31 normal lp

## 2019-10-11 ENCOUNTER — Other Ambulatory Visit: Payer: Self-pay

## 2019-11-12 ENCOUNTER — Other Ambulatory Visit: Payer: Self-pay

## 2019-11-13 ENCOUNTER — Ambulatory Visit (INDEPENDENT_AMBULATORY_CARE_PROVIDER_SITE_OTHER): Payer: Medicare PPO

## 2019-11-13 ENCOUNTER — Encounter: Payer: Self-pay | Admitting: Legal Medicine

## 2019-11-13 DIAGNOSIS — Z23 Encounter for immunization: Secondary | ICD-10-CM

## 2019-11-14 DIAGNOSIS — Z23 Encounter for immunization: Secondary | ICD-10-CM

## 2019-11-26 ENCOUNTER — Ambulatory Visit (INDEPENDENT_AMBULATORY_CARE_PROVIDER_SITE_OTHER): Payer: Medicare PPO | Admitting: Legal Medicine

## 2019-11-26 ENCOUNTER — Encounter: Payer: Self-pay | Admitting: Legal Medicine

## 2019-11-26 ENCOUNTER — Other Ambulatory Visit: Payer: Self-pay

## 2019-11-26 VITALS — BP 130/80 | HR 79 | Temp 97.5°F | Resp 16 | Ht 65.0 in | Wt 199.6 lb

## 2019-11-26 DIAGNOSIS — I5189 Other ill-defined heart diseases: Secondary | ICD-10-CM | POA: Diagnosis not present

## 2019-11-26 DIAGNOSIS — H6120 Impacted cerumen, unspecified ear: Secondary | ICD-10-CM

## 2019-11-26 DIAGNOSIS — J984 Other disorders of lung: Secondary | ICD-10-CM | POA: Insufficient documentation

## 2019-11-26 DIAGNOSIS — H66002 Acute suppurative otitis media without spontaneous rupture of ear drum, left ear: Secondary | ICD-10-CM | POA: Diagnosis not present

## 2019-11-26 DIAGNOSIS — H6123 Impacted cerumen, bilateral: Secondary | ICD-10-CM | POA: Diagnosis not present

## 2019-11-26 HISTORY — DX: Impacted cerumen, unspecified ear: H61.20

## 2019-11-26 HISTORY — DX: Other disorders of lung: J98.4

## 2019-11-26 HISTORY — DX: Other ill-defined heart diseases: I51.89

## 2019-11-26 LAB — PULMONARY FUNCTION TEST
FEV1/FVC: 69.8 %
FEV1: 1.49 L
FVC: 1.95 L

## 2019-11-26 MED ORDER — AMOXICILLIN-POT CLAVULANATE 875-125 MG PO TABS
1.0000 | ORAL_TABLET | Freq: Two times a day (BID) | ORAL | 0 refills | Status: DC
Start: 2019-11-26 — End: 2019-12-18

## 2019-11-26 NOTE — Progress Notes (Signed)
Subjective:  Patient ID: James Richards, male    DOB: [birth year]  Age: 74 y.o. MRN: [id number]  Chief Complaint  Patient presents with  . Otitis Media    pain in left ear for about a week, draining at times, affecting hearing    HPI: left otitis media for one week.  No fever or chills. Whole left side of head hurts at times.  He has impacted cerumen.  Diastolic dysfunction grade 1 in 2016 2D echo but having progressive dyspnea without chest pain.  He tires easily.  He has throat cancer in past with radiation- no evidence for recurrance. He saw Dr. Gaylyn Cheers this year and cancer is OK.  Progressive dyspnea when cranking lawnmower and any activity, no cough, one morning he had trouble breathing but it cleared.  No chest pain.   Current Outpatient Medications on File Prior to Visit  Medication Sig Dispense Refill  . amLODipine (NORVASC) 2.5 MG tablet TAKE 1 TABLET EVERY DAY 90 tablet 2  . apixaban (ELIQUIS) 5 MG TABS tablet Take 5 mg by mouth 2 (two) times daily.     Marland Kitchen aspirin 325 MG tablet Take 1 tablet (325 mg total) by mouth daily. 30 tablet 3  . atorvastatin (LIPITOR) 80 MG tablet TAKE 1 TABLET EVERY DAY 90 tablet 2  . clopidogrel (PLAVIX) 75 MG tablet TAKE 1 TABLET EVERY DAY 90 tablet 2  . furosemide (LASIX) 20 MG tablet Take 1 tablet (20 mg total) by mouth daily. 90 tablet 2  . levothyroxine (SYNTHROID) 100 MCG tablet     . minocycline (MINOCIN) 50 MG capsule TAKE 1 CAPSULE EVERY 12 HOURS 180 capsule 2  . Omega-3 Fatty Acids (FISH OIL) 1200 MG CPDR Take 1 capsule by mouth daily.    Marland Kitchen omeprazole (PRILOSEC) 40 MG capsule Take 1 capsule by mouth daily.    Marland Kitchen rOPINIRole (REQUIP) 0.5 MG tablet     . furosemide (LASIX) 20 MG tablet Take 1 tablet (20 mg total) by mouth daily. (Patient not taking: Reported on 11/26/2019) 30 tablet 1   No current facility-administered medications on file prior to visit.   Past Medical History:  Diagnosis Date  . Essential hypertension   . Malignant neoplasm of  larynx, unspecified ([place])   . Mixed hyperlipidemia   . Other fatigue   . Other specified hypothyroidism   . 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   . Restless legs syndrome    Past Surgical History:  Procedure Laterality Date  . LYMPHADENECTOMY    . PERIPHERAL VASCULAR CATHETERIZATION N/A 11/24/2014   Procedure: Aortic Arch Angiography;  Surgeon: Angelia Mould, MD;  Location: [facility];  Service: Cardiovascular;  Laterality: N/A;  . PERIPHERAL VASCULAR CATHETERIZATION N/A 11/24/2014   Procedure: Cerebral Angiography;  Surgeon: Angelia Mould, MD;  Location: [facility];  Service: Cardiovascular;  Laterality: N/A;  . THROAT SURGERY    . TONSILECTOMY/ADENOIDECTOMY WITH MYRINGOTOMY      Family History  Problem Relation Age of Onset  . Other Father        amputation  . Deep vein thrombosis Father   . Heart disease Father        Atrial Fib.  . Cancer Sister   . Hypertension Brother    Social History   Socioeconomic History  . Marital status: Divorced    Spouse name: Not on file  . Number of children: Not on file  . Years of education: Not on file  .  Highest education level: Not on file  Occupational History  . Not on file  Tobacco Use  . Smoking status: Former Smoker    Types: Cigarettes    Quit date: 02/15/2007    Years since quitting: 12.7  . Smokeless tobacco: Former Systems developer    Types: Secondary school teacher  . Vaping Use: Never used  Substance and Sexual Activity  . Alcohol use: No    Alcohol/week: 0.0 standard drinks  . Drug use: No  . Sexual activity: Yes    Partners: Female  Other Topics Concern  . Not on file  Social History Narrative  . Not on file   Social Determinants of Health   Financial Resource Strain:   . Difficulty of Paying Living Expenses: Not on file  Food Insecurity:   . Worried About Charity fundraiser in the Last Year: Not on file  . Ran Out of Food in the Last Year: Not on file    Transportation Needs:   . Lack of Transportation (Medical): Not on file  . Lack of Transportation (Non-Medical): Not on file  Physical Activity:   . Days of Exercise per Week: Not on file  . Minutes of Exercise per Session: Not on file  Stress:   . Feeling of Stress : Not on file  Social Connections:   . Frequency of Communication with Friends and Family: Not on file  . Frequency of Social Gatherings with Friends and Family: Not on file  . Attends Religious Services: Not on file  . Active Member of Clubs or Organizations: Not on file  . Attends Archivist Meetings: Not on file  . Marital Status: Not on file    Review of Systems  Constitutional: Positive for fatigue.  HENT: Positive for ear pain.   Eyes: Negative.   Respiratory: Positive for cough and shortness of breath.   Cardiovascular: Positive for chest pain and leg swelling. Negative for palpitations.  Gastrointestinal: Negative.   Genitourinary: Negative.   Musculoskeletal: Negative.   Skin: Negative.   Neurological: Negative.   Hematological: Negative.   Psychiatric/Behavioral: Negative.      Objective:  BP 130/80   Pulse 79   Temp (!) 97.5 F (36.4 C)   Resp 16   Ht 5\' 5"  (1.651 m)   Wt 199 lb 9.6 oz (90.5 kg)   SpO2 96%   BMI 33.22 kg/m   BP/Weight 11/26/2019 10/08/2019 1/51/7616  Systolic BP 073 710 626  Diastolic BP 80 96 86  Wt. (Lbs) 199.6 199.8 198.8  BMI 33.22 33.25 33.08    Physical Exam Vitals reviewed.  Constitutional:      Appearance: Normal appearance.  HENT:     Right Ear: Tympanic membrane, ear canal and external ear normal.     Left Ear: There is impacted cerumen.     Mouth/Throat:     Mouth: Mucous membranes are moist.  Eyes:     Extraocular Movements: Extraocular movements intact.     Conjunctiva/sclera: Conjunctivae normal.     Pupils: Pupils are equal, round, and reactive to light.  Neck:     Comments: Neck flexed with some scarring from radiation Cardiovascular:      Rate and Rhythm: Regular rhythm.     Heart sounds: Gallop (S4) present.   Pulmonary:     Effort: Pulmonary effort is normal.     Breath sounds: Normal breath sounds.  Abdominal:     General: Abdomen is flat. Bowel sounds are normal.  Palpations: Abdomen is soft.  Musculoskeletal:        General: Normal range of motion.     Cervical back: Normal range of motion and neck supple.  Skin:    General: Skin is warm and dry.     Capillary Refill: Capillary refill takes less than 2 seconds.  Neurological:     General: No focal deficit present.     Mental Status: He is alert. Mental status is at baseline. He is disoriented.   PFT: FVC 51%, FEV1 61%, FEV1.FVC 117%, PEF 50%    Lab Results  Component Value Date   WBC 6.4 10/08/2019   HGB 14.2 10/08/2019   HCT 41.4 10/08/2019   PLT 273 10/08/2019   GLUCOSE 94 10/08/2019   CHOL 122 10/08/2019   TRIG 57 10/08/2019   HDL 52 10/08/2019   LDLCALC 57 10/08/2019   ALT 25 10/08/2019   AST 25 10/08/2019   NA 138 10/08/2019   K 4.8 10/08/2019   CL 101 10/08/2019   CREATININE 1.02 10/08/2019   BUN 18 10/08/2019   CO2 27 10/08/2019   TSH 2.140 07/05/2019   HGBA1C 6.6 (H) 11/25/2014      Assessment & Plan:   1. Diastolic dysfunction - Ambulatory referral to Cardiology Patient has diastolic dysfunction on 2D echo in 2016, He has severe dyspnea with minimal exercise  2. Restrictive lung disease - Ambulatory referral to Pulmonology AN INDIVIDUAL CARE PLAN for restrictive lung disease was established and reinforced today.  The patient's status was assessed using clinical findings on exam, labs, and other diagnostic testing. Patient's success at meeting treatment goals based on disease specific evidence-bassed guidelines and found to be in poor control. RECOMMENDATIONS include no changes to present medicines and treatment. I am uncertain why he has such restriction except from radiation for his cancer.  3. Non-recurrent acute  suppurative otitis media of left ear without spontaneous rupture of tympanic membrane - amoxicillin-clavulanate (AUGMENTIN) 875-125 MG tablet; Take 1 tablet by mouth 2 (two) times daily.  Dispense: 20 tablet; Refill: 0 Patient is haivng severe pain left ear and we will treat with antibiotics  4. Bilateral impacted cerumen Right ear cleared but patietn unable to stand pain on cerumen removal left ear.  He is to use debrox and antibiotics and follow up one week.    Meds ordered this encounter  Medications  . amoxicillin-clavulanate (AUGMENTIN) 875-125 MG tablet    Sig: Take 1 tablet by mouth 2 (two) times daily.    Dispense:  20 tablet    Refill:  0    Orders Placed This Encounter  Procedures  . Ambulatory referral to Cardiology  . Ambulatory referral to Pulmonology     Follow-up: Return in about 1 week (around 12/03/2019) for for cerumen removal.  An After Visit Summary was printed and given to the patient.  [place] [phone]

## 2019-11-27 NOTE — Addendum Note (Signed)
Addended by: Thompson Caul I on: 11/27/2019 10:49 AM   Modules accepted: Orders

## 2019-11-28 DIAGNOSIS — C329 Malignant neoplasm of larynx, unspecified: Secondary | ICD-10-CM | POA: Insufficient documentation

## 2019-11-30 NOTE — Progress Notes (Signed)
Cardiology Office Note:    Date:  12/02/2019   ID:  James Richards, James Richards 1945-09-14, MRN [id number]  PCP:  James Anes, MD  Cardiologist:  James More, MD   Referring MD: James Richards,*  ASSESSMENT:    1. SOB (shortness of breath)   2. Diastolic dysfunction   3. Essential hypertension    PLAN:    In order of problems listed above:  1. 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  He will have a quick initial evaluation including chest x-ray D-dimer if elevated will need a CTA proBNP level increase his diuretic echocardiogram and see back in the office.  He may require an ischemia evaluation and will make a decision after I see the initial testing. 2. Poorly controlled should respond to sodium restriction and increase diuretic  Next appointment 1 month   Medication Adjustments/Labs and Tests Ordered: Current medicines are reviewed at length with the patient today.  Concerns regarding medicines are outlined above.  Orders Placed This Encounter  Procedures  . EKG 12-Lead   No orders of the defined types were placed in this encounter.    Chief Complaint  Patient presents with  . Shortness of Breath    With PFT showing restrictive lung disease and echocardiogram done 5 years ago with diastolic dysfunction but normal filling pressure.    History of Present Illness:    James Richards is a 74 y.o. male who is being seen today for the evaluation of worsening SOB at the request of James Richards,*.  Most recently he had PFTs done 11/26/2019 showing restrictive lung disease and  office note of 11/26/2019 by Dr. Henrene Richards notes referral for pulmonary evaluation. He has a history of stroke and carotid stenosis with TIA.  11/24/2014 underwent cerebral angiography showed 75 to 80% left carotid stenosis not amenable to endarterectomy.  He has been maintained on long-term anticoagulant therapy with Eliquis.  He also has a history of squamous cell cancer of the base of the tongue treated with induction chemotherapy then concurrent chemoradiation therapy and surgical resection 2010 and recurrence with repeat surgery 2016. Echocardiogram 11/25/2014 for stroke evaluation shows normal ejection fraction 55 to 60% with impaired relaxation and normal left ventricular filling pressure.  Fortunately his wife is present much of the history is obtained from her. He has greater than 50-pack-year cigarette smoking history stopped when he has diagnosis of ENT cancer. She tells me he had severe pulmonary embolism at that time nearly died and he was anticoagulated for period of time perhaps a year. He has radiation and it was implied to her in the past that his shortness of breath which is longstanding was related to injury to the lungs. His weight is up 10 to 15 pounds in the last 6 months he add salt to his diet he sleeping with the head of the bed elevated he has edema in his legs has been on a diuretic for about 6 months and has had several episodes of PND. He has had no chest pain palpitation or syncope no known history of heart disease no palpitation. Past Medical History:  Diagnosis Date  . Atherosclerosis  of native arteries of the extremities with intermittent claudication 10/09/2013  . Atherosclerotic peripheral vascular disease with intermittent claudication ([place]) 10/03/2012  . BMI 33.0-33.9,adult 07/07/2019  . Carotid stenosis 11/24/2014  . Diastolic dysfunction 32/67/1245  . Essential hypertension   . 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 History of recurrent TIAs 12/11/2014  .  Impacted cerumen 11/26/2019  . Malignant neoplasm of base of tongue ([place]) 01/21/2015  . Malignant neoplasm of larynx, unspecified ([place])   . Migraine 04/02/2014  . Mixed hyperlipidemia   . Other fatigue   . Other specified hypothyroidism   . 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   . Restless legs syndrome   . Restrictive lung disease 11/26/2019  . TIA (transient ischemic attack) 11/24/2014    Past Surgical History:  Procedure Laterality Date  . LYMPHADENECTOMY    . PERIPHERAL VASCULAR CATHETERIZATION N/A 11/24/2014   Procedure: Aortic Arch Angiography;  Surgeon: Angelia Mould, MD;  Location: [place] [facility];  Service: Cardiovascular;  Laterality: N/A;  . PERIPHERAL VASCULAR CATHETERIZATION N/A 11/24/2014   Procedure: Cerebral Angiography;  Surgeon: Angelia Mould, MD;  Location: [place] [facility];  Service: Cardiovascular;  Laterality: N/A;  . THROAT SURGERY    . TONSILECTOMY/ADENOIDECTOMY WITH MYRINGOTOMY      Current Medications: Current Meds  Medication Sig  . amLODipine (NORVASC) 2.5 MG tablet TAKE 1 TABLET EVERY DAY  . amoxicillin-clavulanate (AUGMENTIN) 875-125 MG tablet Take 1 tablet by mouth 2 (two) times daily.  Marland Kitchen apixaban (ELIQUIS) 5 MG TABS tablet Take 5 mg by mouth 2 (two) times daily.   Marland Kitchen aspirin 325 MG tablet Take 1 tablet (325 mg total) by mouth daily.  Marland Kitchen atorvastatin (LIPITOR) 80 MG tablet TAKE 1 TABLET EVERY DAY  . clopidogrel (PLAVIX) 75 MG tablet TAKE 1 TABLET EVERY DAY  . furosemide (LASIX) 20 MG tablet Take 1 tablet (20 mg total) by mouth daily.  Marland Kitchen levothyroxine (SYNTHROID) 100 MCG tablet   . minocycline (MINOCIN) 50 MG capsule TAKE 1 CAPSULE EVERY 12 HOURS  . Omega-3 Fatty Acids (FISH OIL) 1200 MG CPDR Take 1 capsule by mouth daily.  Marland Kitchen omeprazole (PRILOSEC) 40 MG capsule Take 1 capsule by mouth daily.  Marland Kitchen rOPINIRole (REQUIP) 0.5 MG tablet      Allergies:   Patient has no known allergies.   Social History    Socioeconomic History  . Marital status: Divorced    Spouse name: Not on file  . Number of children: Not on file  . Years of education: Not on file  . Highest education level: Not on file  Occupational History  . Not on file  Tobacco Use  . Smoking status: Former Smoker    Types: Cigarettes    Quit date: 02/15/2007    Years since quitting: 12.8  . Smokeless tobacco: Former Systems developer    Types: Secondary school teacher  . Vaping Use: Never used  Substance and Sexual Activity  . Alcohol use: No    Alcohol/week: 0.0 standard drinks  . Drug use: No  . Sexual activity: Yes    Partners: Female  Other Topics Concern  . Not on file  Social History Narrative  . Not on file   Social Determinants of Health   Financial Resource Strain:   . Difficulty of Paying Living Expenses: Not on file  Food Insecurity:   . Worried About Charity fundraiser in the Last Year: Not on file  . Ran Out of Food in the  Last Year: Not on file  Transportation Needs:   . Lack of Transportation (Medical): Not on file  . Lack of Transportation (Non-Medical): Not on file  Physical Activity:   . Days of Exercise per Week: Not on file  . Minutes of Exercise per Session: Not on file  Stress:   . Feeling of Stress : Not on file  Social Connections:   . Frequency of Communication with Friends and Family: Not on file  . Frequency of Social Gatherings with Friends and Family: Not on file  . Attends Religious Services: Not on file  . Active Member of Clubs or Organizations: Not on file  . Attends Archivist Meetings: Not on file  . Marital Status: Not on file     Family History: The patient's family history includes Cancer in his sister; Deep vein thrombosis in his father; Heart disease in his father; Hypertension in his brother; Other in his father.  ROS:   ROS Please see the history of present illness.     All other systems reviewed and are negative.  EKGs/Labs/Other Studies Reviewed:    The following  studies were reviewed today:   EKG:  EKG is  ordered today.  The ekg ordered today is personally reviewed and demonstrates sinus rhythm old inferior infarction poor R wave progression left axis deviation  Recent Labs: 07/05/2019: TSH 2.140 10/08/2019: ALT 25; BNP 31.4; BUN 18; Creatinine, Ser 1.02; Hemoglobin 14.2; Platelets 273; Potassium 4.8; Sodium 138  Recent Lipid Panel    Component Value Date/Time   CHOL 122 10/08/2019 1000   TRIG 57 10/08/2019 1000   HDL 52 10/08/2019 1000   CHOLHDL 2.3 10/08/2019 1000   CHOLHDL 2.6 11/25/2014 0715   VLDL 12 11/25/2014 0715   LDLCALC 57 10/08/2019 1000    Physical Exam:    VS:  BP (!) 152/92   Pulse 75   Ht 5\' 5"  (1.651 m)   Wt 201 lb 6.4 oz (91.4 kg)   SpO2 98%   BMI 33.51 kg/m     Wt Readings from Last 3 Encounters:  12/02/19 201 lb 6.4 oz (91.4 kg)  11/26/19 199 lb 9.6 oz (90.5 kg)  10/08/19 199 lb 12.8 oz (90.6 kg)     GEN: He looks older than his age barrel chested he is a very poor historian well nourished, well developed in no acute distress HEENT: Normal NECK: No JVD; No carotid bruits LYMPHATICS: No lymphadenopathy CARDIAC: RRR, soft grade 1/6 midsystolic ejection murmur does not encompass S2 no aortic regurgitation, rubs, gallops RESPIRATORY:  Clear to auscultation without rales, wheezing or rhonchi  ABDOMEN: Soft, non-tender, non-distended MUSCULOSKELETAL: He has 3-4+ bilateral lower extremity pitting edema; No deformity  SKIN: Warm and dry NEUROLOGIC:  Alert and oriented x 3 PSYCHIATRIC:  Normal affect     Signed, James More, MD  12/02/2019 9:01 AM    [place]

## 2019-12-02 ENCOUNTER — Ambulatory Visit (INDEPENDENT_AMBULATORY_CARE_PROVIDER_SITE_OTHER): Payer: Medicare PPO | Admitting: Cardiology

## 2019-12-02 ENCOUNTER — Encounter: Payer: Self-pay | Admitting: Cardiology

## 2019-12-02 ENCOUNTER — Other Ambulatory Visit: Payer: Self-pay

## 2019-12-02 VITALS — BP 152/92 | HR 75 | Ht 65.0 in | Wt 201.4 lb

## 2019-12-02 DIAGNOSIS — I10 Essential (primary) hypertension: Secondary | ICD-10-CM

## 2019-12-02 DIAGNOSIS — I5189 Other ill-defined heart diseases: Secondary | ICD-10-CM | POA: Diagnosis not present

## 2019-12-02 DIAGNOSIS — K449 Diaphragmatic hernia without obstruction or gangrene: Secondary | ICD-10-CM | POA: Diagnosis not present

## 2019-12-02 DIAGNOSIS — R0602 Shortness of breath: Secondary | ICD-10-CM

## 2019-12-02 DIAGNOSIS — R918 Other nonspecific abnormal finding of lung field: Secondary | ICD-10-CM | POA: Diagnosis not present

## 2019-12-02 DIAGNOSIS — I119 Hypertensive heart disease without heart failure: Secondary | ICD-10-CM

## 2019-12-02 MED ORDER — FUROSEMIDE 40 MG PO TABS: 40.0000 mg | ORAL_TABLET | Freq: Every day | ORAL | 3 refills | Status: DC

## 2019-12-02 NOTE — Patient Instructions (Signed)
Medication Instructions:  Your physician has recommended you make the following change in your medication:  INCREASE: Furosemide 40 mg take one tablet by mouth daily.  *If you need a refill on your cardiac medications before your next appointment, please call your pharmacy*   Lab Work: Your physician recommends that you return for lab work in: TODAY D-dimer, BNP, BMP If you have labs (blood work) drawn today and your tests are completely normal, you will receive your results only by: Marland Kitchen MyChart Message (if you have MyChart) OR . A paper copy in the mail If you have any lab test that is abnormal or we need to change your treatment, we will call you to review the results.   Testing/Procedures: Your physician has requested that you have an echocardiogram. Echocardiography is a painless test that uses sound waves to create images of your heart. It provides your doctor with information about the size and shape of your heart and how well your heart's chambers and valves are working. This procedure takes approximately one hour. There are no restrictions for this procedure.  We have given you the form to get a chest x-ray completed at [facility]. Please get this done as soon as possible.    Follow-Up: At [facility], you and your health needs are our priority.  As part of our continuing mission to provide you with exceptional heart care, we have created designated Provider Care Teams.  These Care Teams include your primary Cardiologist (physician) and Advanced Practice Providers (APPs -  Physician Assistants and Nurse Practitioners) who all work together to provide you with the care you need, when you need it.  We recommend signing up for the patient portal called "MyChart".  Sign up information is provided on this After Visit Summary.  MyChart is used to connect with patients for Virtual Visits (Telemedicine).  Patients are able to view lab/test results, encounter notes, upcoming  appointments, etc.  Non-urgent messages can be sent to your provider as well.   To learn more about what you can do with MyChart, go to [email or web address].    Your next appointment:   4 week(s)  The format for your next appointment:   In Person  Provider:   Shirlee More, MD   Other Instructions

## 2019-12-03 ENCOUNTER — Encounter: Payer: Self-pay | Admitting: Legal Medicine

## 2019-12-03 ENCOUNTER — Ambulatory Visit (INDEPENDENT_AMBULATORY_CARE_PROVIDER_SITE_OTHER): Payer: Medicare PPO | Admitting: Legal Medicine

## 2019-12-03 VITALS — BP 120/80 | HR 101 | Temp 97.3°F | Resp 17 | Ht 64.0 in | Wt 200.0 lb

## 2019-12-03 DIAGNOSIS — J984 Other disorders of lung: Secondary | ICD-10-CM

## 2019-12-03 LAB — BASIC METABOLIC PANEL
BUN/Creatinine Ratio: 25 — ABNORMAL HIGH (ref 10–24)
BUN: 25 mg/dL (ref 8–27)
CO2: 26 mmol/L (ref 20–29)
Calcium: 9.5 mg/dL (ref 8.6–10.2)
Chloride: 103 mmol/L (ref 96–106)
Creatinine, Ser: 1.01 mg/dL (ref 0.76–1.27)
GFR calc Af Amer: 86 mL/min/{1.73_m2} (ref >59)
GFR calc non Af Amer: 74 mL/min/{1.73_m2} (ref >59)
Glucose: 141 mg/dL — ABNORMAL HIGH (ref 65–99)
Potassium: 4.3 mmol/L (ref 3.5–5.2)
Sodium: 142 mmol/L (ref 134–144)

## 2019-12-03 LAB — PRO B NATRIURETIC PEPTIDE: NT-Pro BNP: 100 pg/mL (ref 0–376)

## 2019-12-03 LAB — D-DIMER, QUANTITATIVE: D-DIMER: 0.34 mg/L FEU (ref 0.00–0.49)

## 2019-12-03 NOTE — Progress Notes (Signed)
Acute Office Visit  Subjective:    Patient ID: James Richards, male    DOB: Jul 22, 1945, 74 y.o.   MRN: [id number]  Chief Complaint  Patient presents with  . Shortness of Breath    HPI Patient is in today for dyspnea. Cardiac workup good, he has restrictive pattern to his PFTs some due to weight. He had PEs in past and radiation that can cause restrictive disease. We are awaiting pulmonary consult.  Past Medical History:  Diagnosis Date  . Atherosclerosis of native arteries of the extremities with intermittent claudication 10/09/2013  . Atherosclerotic peripheral vascular disease with intermittent claudication ([place]) 10/03/2012  . BMI 33.0-33.9,adult 07/07/2019  . Carotid stenosis 11/24/2014  . Diastolic dysfunction 28/76/8115  . Essential hypertension   . 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 History of recurrent TIAs 12/11/2014  . Impacted cerumen 11/26/2019  . Malignant neoplasm of base of tongue ([place]) 01/21/2015  . Malignant neoplasm of larynx, unspecified ([place])   . Migraine 04/02/2014  . Mixed hyperlipidemia   . Other fatigue   . Other specified hypothyroidism   . 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   . Restless legs syndrome   . Restrictive lung disease 11/26/2019  . TIA (transient ischemic attack) 11/24/2014    Past Surgical History:  Procedure Laterality Date  . LYMPHADENECTOMY    . PERIPHERAL VASCULAR CATHETERIZATION N/A 11/24/2014   Procedure: Aortic Arch Angiography;  Surgeon: Angelia Mould, MD;  Location: [place] [facility];  Service: Cardiovascular;  Laterality: N/A;  . PERIPHERAL VASCULAR CATHETERIZATION N/A 11/24/2014   Procedure: Cerebral Angiography;  Surgeon: Angelia Mould, MD;  Location: [place] [facility];  Service: Cardiovascular;  Laterality: N/A;  . THROAT SURGERY    . TONSILECTOMY/ADENOIDECTOMY WITH MYRINGOTOMY      Family History  Problem Relation Age of Onset  . Other Father         amputation  . Deep vein thrombosis Father   . Heart disease Father        Atrial Fib.  . Cancer Sister   . Hypertension Brother     Social History   Socioeconomic History  . Marital status: Divorced    Spouse name: Not on file  . Number of children: Not on file  . Years of education: Not on file  . Highest education level: Not on file  Occupational History  . Not on file  Tobacco Use  . Smoking status: Former Smoker    Types: Cigarettes    Quit date: 02/15/2007    Years since quitting: 12.8  . Smokeless tobacco: Former Systems developer    Types: Secondary school teacher  . Vaping Use: Never used  Substance and Sexual Activity  . Alcohol use: No    Alcohol/week: 0.0 standard drinks  . Drug use: No  . Sexual activity: Yes    Partners: Female  Other Topics Concern  . Not on file  Social History Narrative  . Not on file   Social Determinants of Health   Financial Resource Strain:   . Difficulty of Paying Living Expenses: Not on file  Food Insecurity:   . Worried About Charity fundraiser in the Last Year: Not on file  . Ran Out of Food in the Last Year: Not on file  Transportation Needs:   . Lack of Transportation (Medical): Not on file  . Lack of Transportation (Non-Medical): Not on file  Physical Activity:   . Days of Exercise per Week:  Not on file  . Minutes of Exercise per Session: Not on file  Stress:   . Feeling of Stress : Not on file  Social Connections:   . Frequency of Communication with Friends and Family: Not on file  . Frequency of Social Gatherings with Friends and Family: Not on file  . Attends Religious Services: Not on file  . Active Member of Clubs or Organizations: Not on file  . Attends Archivist Meetings: Not on file  . Marital Status: Not on file  Intimate Partner Violence:   . Fear of Current or Ex-Partner: Not on file  . Emotionally Abused: Not on file  . Physically Abused: Not on file  . Sexually Abused: Not on file    Outpatient  Medications Prior to Visit  Medication Sig Dispense Refill  . amLODipine (NORVASC) 2.5 MG tablet TAKE 1 TABLET EVERY DAY 90 tablet 2  . amoxicillin-clavulanate (AUGMENTIN) 875-125 MG tablet Take 1 tablet by mouth 2 (two) times daily. 20 tablet 0  . aspirin EC 81 MG tablet Take 81 mg by mouth daily. Swallow whole.    Marland Kitchen atorvastatin (LIPITOR) 80 MG tablet TAKE 1 TABLET EVERY DAY 90 tablet 2  . clopidogrel (PLAVIX) 75 MG tablet TAKE 1 TABLET EVERY DAY 90 tablet 2  . furosemide (LASIX) 40 MG tablet Take 1 tablet (40 mg total) by mouth daily. 90 tablet 3  . levothyroxine (SYNTHROID) 100 MCG tablet     . minocycline (MINOCIN) 50 MG capsule TAKE 1 CAPSULE EVERY 12 HOURS 180 capsule 2  . Omega-3 Fatty Acids (FISH OIL) 1200 MG CPDR Take 1 capsule by mouth daily.    Marland Kitchen omeprazole (PRILOSEC) 40 MG capsule Take 1 capsule by mouth daily.    Marland Kitchen rOPINIRole (REQUIP) 0.5 MG tablet     . aspirin 325 MG tablet Take 1 tablet (325 mg total) by mouth daily. 30 tablet 3   No facility-administered medications prior to visit.    No Known Allergies  Review of Systems  Constitutional: Positive for activity change and diaphoresis. Negative for appetite change and chills.  HENT: Negative for ear pain, facial swelling, hearing loss and mouth sores.   Eyes: Negative.   Respiratory: Positive for shortness of breath.   Cardiovascular: Negative for chest pain, palpitations and leg swelling.  Gastrointestinal: Negative.   Genitourinary: Negative.   Musculoskeletal: Negative.   Skin: Negative.   Neurological: Negative.   Psychiatric/Behavioral: Negative.        Objective:    Physical Exam Vitals reviewed.  Constitutional:      Appearance: He is well-developed.  HENT:     Head: Normocephalic and atraumatic.     Left Ear: There is impacted cerumen.     Mouth/Throat:     Mouth: Mucous membranes are moist.     Pharynx: Oropharynx is clear.  Eyes:     Pupils: Pupils are equal, round, and reactive to light.    Cardiovascular:     Rate and Rhythm: Normal rate and regular rhythm.     Pulses: Normal pulses.     Heart sounds: Normal heart sounds.  Pulmonary:     Effort: Pulmonary effort is normal.     Breath sounds: Normal breath sounds.  Abdominal:     General: Bowel sounds are normal.     Palpations: Abdomen is soft.  Musculoskeletal:        General: Normal range of motion.     Cervical back: Normal range of motion.  Skin:  General: Skin is warm.     Capillary Refill: Capillary refill takes less than 2 seconds.  Neurological:     General: No focal deficit present.     Mental Status: He is alert and oriented to person, place, and time.  Psychiatric:        Mood and Affect: Mood normal.   walk test: O2 sat rest RA 100%, brisk walk 6 minutes O2sat 96%.  BP 120/80   Pulse (!) 101   Temp (!) 97.3 F (36.3 C)   Resp 17   Ht 5\' 4"  (1.626 m)   Wt 200 lb (90.7 kg)   SpO2 99%   BMI 34.33 kg/m  Wt Readings from Last 3 Encounters:  12/03/19 200 lb (90.7 kg)  12/02/19 201 lb 6.4 oz (91.4 kg)  11/26/19 199 lb 9.6 oz (90.5 kg)    Health Maintenance Due  Topic Date Due  . Hepatitis C Screening  Never done  . COVID-19 Vaccine (1) Never done  . TETANUS/TDAP  Never done  . COLONOSCOPY  Never done  . PNA vac Low Risk Adult (1 of 2 - PCV13) Never done    There are no preventive care reminders to display for this patient.   Lab Results  Component Value Date   TSH 2.140 07/05/2019   Lab Results  Component Value Date   WBC 6.4 10/08/2019   HGB 14.2 10/08/2019   HCT 41.4 10/08/2019   MCV 93 10/08/2019   PLT 273 10/08/2019   Lab Results  Component Value Date   NA 142 12/02/2019   K 4.3 12/02/2019   CO2 26 12/02/2019   GLUCOSE 141 (H) 12/02/2019   BUN 25 12/02/2019   CREATININE 1.01 12/02/2019   BILITOT 0.3 10/08/2019   ALKPHOS 72 10/08/2019   AST 25 10/08/2019   ALT 25 10/08/2019   PROT 6.4 10/08/2019   ALBUMIN 4.0 10/08/2019   CALCIUM 9.5 12/02/2019   ANIONGAP 10  11/25/2014   Lab Results  Component Value Date   CHOL 122 10/08/2019   Lab Results  Component Value Date   HDL 52 10/08/2019   Lab Results  Component Value Date   LDLCALC 57 10/08/2019   Lab Results  Component Value Date   TRIG 57 10/08/2019   Lab Results  Component Value Date   CHOLHDL 2.3 10/08/2019   Lab Results  Component Value Date   HGBA1C 6.6 (H) 11/25/2014       Assessment & Plan:  1. Restrictive lung disease Patient has evidence for restrictive lung disease , his cardiac workup so far shows no cardiac causes.  We await pulmonary consul.  May need steroids.        Follow-up: Return in about 3 months (around 03/04/2020).  An After Visit Summary was printed and given to the patient.  [place] [phone]

## 2019-12-05 ENCOUNTER — Other Ambulatory Visit: Payer: Self-pay

## 2019-12-05 ENCOUNTER — Telehealth: Payer: Self-pay

## 2019-12-05 DIAGNOSIS — R0602 Shortness of breath: Secondary | ICD-10-CM

## 2019-12-05 NOTE — Telephone Encounter (Signed)
Per Dr. Bettina Gavia:  His chest x-ray is abnormal, lets have him do a noncontrast CT of the chest high resolution  Spoke with patient regarding results and recommendation.  Patient verbalizes understanding and is agreeable to plan of care. Advised patient to call back with any issues or concerns.

## 2019-12-11 ENCOUNTER — Encounter: Payer: Self-pay | Admitting: Cardiology

## 2019-12-11 DIAGNOSIS — R0602 Shortness of breath: Secondary | ICD-10-CM | POA: Diagnosis not present

## 2019-12-13 ENCOUNTER — Telehealth: Payer: Self-pay | Admitting: Cardiology

## 2019-12-13 NOTE — Telephone Encounter (Signed)
Spoke to the patients cousin just now and let her know that Dr. Bettina Gavia is out of the office today and will return on Monday. Upon his return he will review the test and we will call them with results.    Encouraged patient to call back with any questions or concerns.

## 2019-12-13 NOTE — Telephone Encounter (Signed)
Patient's cousin calling for CT results.

## 2019-12-16 NOTE — Telephone Encounter (Signed)
Patient's wife following up.

## 2019-12-16 NOTE — Telephone Encounter (Signed)
I reviewed his chest CT He has a small stable pulmonary nodule and does not have any significant chronic lung injury. Surprisingly it says that he has liver disease with cirrhosis. We can review further at office follow-up

## 2019-12-16 NOTE — Telephone Encounter (Signed)
Spoke with patients wife regarding results and recommendation. ° °She verbalizes understanding and is agreeable to plan of care. Advised patient to call back with any issues or concerns.  °

## 2019-12-18 ENCOUNTER — Encounter: Payer: Self-pay | Admitting: Legal Medicine

## 2019-12-18 ENCOUNTER — Other Ambulatory Visit: Payer: Self-pay

## 2019-12-18 ENCOUNTER — Ambulatory Visit: Payer: Medicare PPO | Admitting: Legal Medicine

## 2019-12-18 VITALS — BP 128/80 | HR 75 | Temp 97.4°F | Resp 16 | Ht 64.0 in | Wt 198.4 lb

## 2019-12-18 DIAGNOSIS — K7030 Alcoholic cirrhosis of liver without ascites: Secondary | ICD-10-CM | POA: Diagnosis not present

## 2019-12-18 DIAGNOSIS — H6123 Impacted cerumen, bilateral: Secondary | ICD-10-CM

## 2019-12-18 DIAGNOSIS — K7460 Unspecified cirrhosis of liver: Secondary | ICD-10-CM | POA: Insufficient documentation

## 2019-12-18 NOTE — Progress Notes (Signed)
Subjective:  Patient ID: James Richards, male    DOB: Jun 18, 1945  Age: 74 y.o. MRN: [id number]  Chief Complaint  Patient presents with  . Shortness of Breath    reflective lung disease 1 week follow up-    HPI: chronic visit  His ear has improved on antibiotics.  No drainage.  CT chest shows new alcoholic cirrhosis probably from alcohol.  Not tested for hepatitis C. We needs to work up the degree of cirrhosis .       Current Outpatient Medications on File Prior to Visit  Medication Sig Dispense Refill  . amLODipine (NORVASC) 2.5 MG tablet TAKE 1 TABLET EVERY DAY 90 tablet 2  . aspirin EC 81 MG tablet Take 81 mg by mouth daily. Swallow whole.    Marland Kitchen atorvastatin (LIPITOR) 80 MG tablet TAKE 1 TABLET EVERY DAY 90 tablet 2  . clopidogrel (PLAVIX) 75 MG tablet TAKE 1 TABLET EVERY DAY 90 tablet 2  . furosemide (LASIX) 40 MG tablet Take 1 tablet (40 mg total) by mouth daily. 90 tablet 3  . levothyroxine (SYNTHROID) 100 MCG tablet     . minocycline (MINOCIN) 50 MG capsule TAKE 1 CAPSULE EVERY 12 HOURS 180 capsule 2  . Omega-3 Fatty Acids (FISH OIL) 1200 MG CPDR Take 1 capsule by mouth daily.    Marland Kitchen omeprazole (PRILOSEC) 40 MG capsule Take 1 capsule by mouth daily.    Marland Kitchen rOPINIRole (REQUIP) 0.5 MG tablet      No current facility-administered medications on file prior to visit.   Past Medical History:  Diagnosis Date  . Atherosclerosis of native arteries of the extremities with intermittent claudication 10/09/2013  . Atherosclerotic peripheral vascular disease with intermittent claudication ([place]) 10/03/2012  . BMI 33.0-33.9,adult 07/07/2019  . Carotid stenosis 11/24/2014  . Diastolic dysfunction 82/50/5397  . Essential hypertension   . 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 History of recurrent TIAs 12/11/2014  . Impacted cerumen 11/26/2019  . Malignant neoplasm of base of tongue ([place]) 01/21/2015  . Malignant neoplasm of larynx, unspecified ([place])   . Migraine 04/02/2014  . Mixed  hyperlipidemia   . Other fatigue   . Other specified hypothyroidism   . 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   . Restless legs syndrome   . Restrictive lung disease 11/26/2019  . TIA (transient ischemic attack) 11/24/2014   Past Surgical History:  Procedure Laterality Date  . LYMPHADENECTOMY    . PERIPHERAL VASCULAR CATHETERIZATION N/A 11/24/2014   Procedure: Aortic Arch Angiography;  Surgeon: Angelia Mould, MD;  Location: [facility];  Service: Cardiovascular;  Laterality: N/A;  . PERIPHERAL VASCULAR CATHETERIZATION N/A 11/24/2014   Procedure: Cerebral Angiography;  Surgeon: Angelia Mould, MD;  Location: [facility];  Service: Cardiovascular;  Laterality: N/A;  . THROAT SURGERY    . TONSILECTOMY/ADENOIDECTOMY WITH MYRINGOTOMY      Family History  Problem Relation Age of Onset  . Other Father        amputation  . Deep vein thrombosis Father   . Heart disease Father        Atrial Fib.  . Cancer Sister   . Hypertension Brother    Social History   Socioeconomic History  . Marital status: Divorced    Spouse name: Not on file  . Number of children: Not on file  . Years of education: Not on file  . Highest education level: Not on file  Occupational History  . Not on file  Tobacco Use  . Smoking status: Former Smoker    Types: Cigarettes    Quit date: 02/15/2007    Years since quitting: 12.8  . Smokeless tobacco: Former Systems developer    Types: Secondary school teacher  . Vaping Use: Never used  Substance and Sexual Activity  . Alcohol use: No    Alcohol/week: 0.0 standard drinks  . Drug use: No  . Sexual activity: Yes    Partners: Female  Other Topics Concern  . Not on file  Social History Narrative  . Not on file   Social Determinants of Health   Financial Resource Strain:   . Difficulty of Paying Living Expenses: Not on file  Food Insecurity:   . Worried About Charity fundraiser in the Last Year: Not on file  . Ran Out  of Food in the Last Year: Not on file  Transportation Needs:   . Lack of Transportation (Medical): Not on file  . Lack of Transportation (Non-Medical): Not on file  Physical Activity:   . Days of Exercise per Week: Not on file  . Minutes of Exercise per Session: Not on file  Stress:   . Feeling of Stress : Not on file  Social Connections:   . Frequency of Communication with Friends and Family: Not on file  . Frequency of Social Gatherings with Friends and Family: Not on file  . Attends Religious Services: Not on file  . Active Member of Clubs or Organizations: Not on file  . Attends Archivist Meetings: Not on file  . Marital Status: Not on file    Review of Systems  Constitutional: Negative.   Eyes: Negative.   Respiratory: Negative.   Cardiovascular: Negative.   Genitourinary: Negative.   Neurological: Negative.   Hematological: Negative.   Psychiatric/Behavioral: Negative.      Objective:  BP 128/80   Pulse 75   Temp (!) 97.4 F (36.3 C)   Resp 16   Ht 5\' 4"  (1.626 m)   Wt 198 lb 6.4 oz (90 kg)   SpO2 100%   BMI 34.06 kg/m   BP/Weight 12/18/2019 12/03/2019 97/98/9211  Systolic BP 941 740 814  Diastolic BP 80 80 92  Wt. (Lbs) 198.4 200 201.4  BMI 34.06 34.33 33.51    Physical Exam Vitals reviewed.  Constitutional:      Appearance: He is well-developed.  HENT:     Head: Normocephalic.     Right Ear: Tympanic membrane, ear canal and external ear normal.     Left Ear: Tympanic membrane, ear canal and external ear normal.     Mouth/Throat:     Mouth: Mucous membranes are moist.  Eyes:     Extraocular Movements: Extraocular movements intact.     Pupils: Pupils are equal, round, and reactive to light.  Cardiovascular:     Rate and Rhythm: Normal rate and regular rhythm.     Pulses: Normal pulses.     Heart sounds: Normal heart sounds.  Pulmonary:     Effort: Pulmonary effort is normal.     Breath sounds: Normal breath sounds.  Abdominal:      General: Abdomen is flat. Bowel sounds are normal.     Palpations: Abdomen is soft.     Comments: No hepatomegaly  Musculoskeletal:     Cervical back: Normal range of motion and neck supple.  Skin:    General: Skin is warm.     Capillary Refill: Capillary refill takes less than 2 seconds.  Neurological:     General: No focal deficit present.     Mental Status: He is alert.       Lab Results  Component Value Date   WBC 6.4 10/08/2019   HGB 14.2 10/08/2019   HCT 41.4 10/08/2019   PLT 273 10/08/2019   GLUCOSE 141 (H) 12/02/2019   CHOL 122 10/08/2019   TRIG 57 10/08/2019   HDL 52 10/08/2019   LDLCALC 57 10/08/2019   ALT 25 10/08/2019   AST 25 10/08/2019   NA 142 12/02/2019   K 4.3 12/02/2019   CL 103 12/02/2019   CREATININE 1.01 12/02/2019   BUN 25 12/02/2019   CO2 26 12/02/2019   TSH 2.140 07/05/2019   HGBA1C 6.6 (H) 11/25/2014      Assessment & Plan:   1. Alcoholic cirrhosis, unspecified whether ascites present ([place]) - Hepatitis, Diagnostic (Prof I) - Hepatic Function Panel - Protime-INR We need to work up his cirrhosis and check for severeity  2. Bilateral impacted cerumen Cerumen removed , no problems      Orders Placed This Encounter  Procedures  . Hepatitis, Diagnostic (Prof I)  . Hepatic Function Panel  . Protime-INR     Follow-up: Return in about 2 months (around 02/17/2020).  An After Visit Summary was printed and given to the patient.  [place] [place] [id number]

## 2019-12-19 ENCOUNTER — Ambulatory Visit (INDEPENDENT_AMBULATORY_CARE_PROVIDER_SITE_OTHER): Payer: Medicare PPO

## 2019-12-19 DIAGNOSIS — R0602 Shortness of breath: Secondary | ICD-10-CM

## 2019-12-19 DIAGNOSIS — I5189 Other ill-defined heart diseases: Secondary | ICD-10-CM

## 2019-12-19 DIAGNOSIS — I119 Hypertensive heart disease without heart failure: Secondary | ICD-10-CM | POA: Diagnosis not present

## 2019-12-19 LAB — PROTIME-INR
INR: 1 (ref 0.9–1.2)
Prothrombin Time: 10.2 s (ref 9.1–12.0)

## 2019-12-19 LAB — HEPATIC FUNCTION PANEL
ALT: 23 IU/L (ref 0–44)
AST: 21 IU/L (ref 0–40)
Albumin: 4.2 g/dL (ref 3.7–4.7)
Alkaline Phosphatase: 79 IU/L (ref 44–121)
Bilirubin Total: 0.4 mg/dL (ref 0.0–1.2)
Bilirubin, Direct: 0.12 mg/dL (ref 0.00–0.40)
Total Protein: 6.6 g/dL (ref 6.0–8.5)

## 2019-12-19 LAB — ECHOCARDIOGRAM COMPLETE
Area-P 1/2: 3.65 cm2
S' Lateral: 2.4 cm

## 2019-12-19 LAB — HEPATITIS, DIAGNOSTIC (PROF I)
Hep A IgM: NEGATIVE
Hep B C IgM: NEGATIVE
Hepatitis B Surface Ag: NEGATIVE

## 2019-12-19 NOTE — Progress Notes (Signed)
Complete echocardiogram performed.  Jimmy Riyansh Gerstner RDCS, RVT  

## 2019-12-19 NOTE — Progress Notes (Signed)
No hepatitis as cause of liver problems, protime and enzyme are all normal, these are good markers. lp

## 2019-12-23 ENCOUNTER — Telehealth: Payer: Self-pay

## 2019-12-23 NOTE — Telephone Encounter (Signed)
Pt called in an was given his lab results, pt verbally understood results.

## 2019-12-30 ENCOUNTER — Telehealth: Payer: Self-pay

## 2019-12-30 NOTE — Telephone Encounter (Signed)
Per Note from Dr. Bettina Gavia:  Echo is good no findings of heart failure  Spoke with patient regarding results and recommendation.  Patient verbalizes understanding and is agreeable to plan of care. Advised patient to call back with any issues or concerns.

## 2019-12-31 ENCOUNTER — Ambulatory Visit (INDEPENDENT_AMBULATORY_CARE_PROVIDER_SITE_OTHER): Payer: Medicare PPO

## 2019-12-31 ENCOUNTER — Other Ambulatory Visit: Payer: Self-pay

## 2019-12-31 DIAGNOSIS — Z23 Encounter for immunization: Secondary | ICD-10-CM | POA: Diagnosis not present

## 2019-12-31 NOTE — Progress Notes (Signed)
   Covid-19 Vaccination Clinic  Name:  James Richards    MRN: [id number] DOB: [birth year]  12/31/2019  [name] was observed post Covid-19 immunization for 15 minutes without incident. He was provided with Vaccine Information Sheet and instruction to access the V-Safe system.   Mr. Reason was instructed to call 911 with any severe reactions post vaccine: Marland Kitchen Difficulty breathing  . Swelling of face and throat  . A fast heartbeat  . A bad rash all over body  . Dizziness and weakness

## 2020-01-07 ENCOUNTER — Ambulatory Visit: Payer: Medicare PPO | Admitting: Cardiology

## 2020-01-08 ENCOUNTER — Encounter: Payer: Self-pay | Admitting: Cardiology

## 2020-01-08 ENCOUNTER — Other Ambulatory Visit: Payer: Self-pay

## 2020-01-08 ENCOUNTER — Ambulatory Visit: Payer: Medicare PPO | Admitting: Cardiology

## 2020-01-08 VITALS — BP 151/83 | HR 110 | Ht 64.0 in | Wt 198.4 lb

## 2020-01-08 DIAGNOSIS — R0602 Shortness of breath: Secondary | ICD-10-CM

## 2020-01-08 DIAGNOSIS — I5032 Chronic diastolic (congestive) heart failure: Secondary | ICD-10-CM

## 2020-01-08 DIAGNOSIS — I110 Hypertensive heart disease with heart failure: Secondary | ICD-10-CM

## 2020-01-08 DIAGNOSIS — K7460 Unspecified cirrhosis of liver: Secondary | ICD-10-CM | POA: Diagnosis not present

## 2020-01-08 MED ORDER — LOSARTAN POTASSIUM 50 MG PO TABS: 50.0000 mg | ORAL_TABLET | Freq: Every day | ORAL | 3 refills | Status: DC

## 2020-01-08 NOTE — Progress Notes (Signed)
Cardiology Office Note:    Date:  01/08/2020   ID:  James, Richards 1945-05-24, MRN [id number]  PCP:  Lillard Anes, MD  Cardiologist:  Shirlee More, MD    Referring MD: Lillard Anes,*    ASSESSMENT:    1. Shortness of breath   2. Hypertensive heart disease with chronic diastolic congestive heart failure ([place])   3. Cirrhosis of liver without ascites, unspecified hepatic cirrhosis type ([place])    PLAN:    In order of problems listed above:  1. He is markedly improved shortness of breath as well as edema with increasing his diuretic and I think clinically the best descriptor as he had decompensated heart failure and is seen in the office by me.  In general I avoid calcium channel blockers in these individuals he discontinued take a low-dose of an ARB.  Recheck his renal function is is had a marked diuresis although his weight has not changed and check BMP proBNP level and he will follow up with his primary care physician that should help with blood pressure control target 140/80 2. New finding on CT scan superimposed on a background of alcohol abuse   Next appointment: As needed with me   Medication Adjustments/Labs and Tests Ordered: Current medicines are reviewed at length with the patient today.  Concerns regarding medicines are outlined above.  No orders of the defined types were placed in this encounter.  Meds ordered this encounter  Medications  . losartan (COZAAR) 50 MG tablet    Sig: Take 1 tablet (50 mg total) by mouth daily.    Dispense:  90 tablet    Refill:  3    Chief Complaint  Patient presents with  . Follow-up  . Leg Swelling  . Shortness of Breath    History of Present Illness:    James Richards is a 74 y.o. male with a hx of shortness of breath and hypertension last seen 12/02/2019.  Seen in the office blood pressure is poorly controlled in 3-4+ bilateral lower extremity pitting edema and diuretics were increased.He has a history  of stroke and carotid stenosis with TIA.  11/24/2014 underwent cerebral angiography showed 75 to 80% left carotid stenosis not amenable to endarterectomy.  He has been maintained on long-term anticoagulant therapy with Eliquis.  He also has a history of squamous cell cancer of the base of the tongue treated with induction chemotherapy then concurrent chemoradiation therapy and surgical resection 2010 and recurrence with repeat surgery 2016.  He also has a history of greater than 50-pack-year cigarette smoking and had pulmonary embolism was anticoagulated for a year in association with his cancer.  Compliance with diet, lifestyle and medications: Yes  He has markedly improved edema has just about cleared and less short of breath.  He is pleased with the quality of his life.  It is difficult to define but I think in retrospect he was at least volume overloaded likely an element of diastolic heart failure and by the time his ultrasound of his heart was done his filling pressures are improved.  He does still have 1-2+ edema takes a calcium channel blocker will discontinue and I will put him on a low-dose of an ARB and recheck his BMP proBNP today.  At this time I do not think he needs further cardiac testing as he is improved and will follow up with Dr. Henrene Pastor regarding hypertension.  His wife questions me about his cirrhosis on CT and apparently had very  heavy alcohol usage in the past tells me he knew he had liver disease.  I independently reviewed the echocardiogram 12/19/2019.  Diastolic function is difficult to define with varying E/A ratio's however is Medial  E prime is greater than 7 and E prime ratio less than 15 and I do not think it pseudonormal function.  Left ventricular systolic function is normal terminal proBNP level was quite low. He had chest CT performed without contrast showing no evidence of metastatic disease however he did have coronary artery calcification aortic atherosclerosis and on  abdominal images felt to have hepatic cirrhosis. D-dimer was low 0.34.  Renal function was normal GFR 74 cc potassium 4.3 sodium 142  Past Medical History:  Diagnosis Date  . Atherosclerosis of native arteries of the extremities with intermittent claudication 10/09/2013  . Atherosclerotic peripheral vascular disease with intermittent claudication ([place]) 10/03/2012  . BMI 33.0-33.9,adult 07/07/2019  . Carotid stenosis 11/24/2014  . Diastolic dysfunction 70/02/7492  . Essential hypertension   . 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 History of recurrent TIAs 12/11/2014  . Impacted cerumen 11/26/2019  . Malignant neoplasm of base of tongue ([place]) 01/21/2015  . Malignant neoplasm of larynx, unspecified ([place])   . Migraine 04/02/2014  . Mixed hyperlipidemia   . Other fatigue   . Other specified hypothyroidism   . 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   . Restless legs syndrome   . Restrictive lung disease 11/26/2019  . TIA (transient ischemic attack) 11/24/2014    Past Surgical History:  Procedure Laterality Date  . LYMPHADENECTOMY    . PERIPHERAL VASCULAR CATHETERIZATION N/A 11/24/2014   Procedure: Aortic Arch Angiography;  Surgeon: Angelia Mould, MD;  Location: [facility];  Service: Cardiovascular;  Laterality: N/A;  . PERIPHERAL VASCULAR CATHETERIZATION N/A 11/24/2014   Procedure: Cerebral Angiography;  Surgeon: Angelia Mould, MD;  Location: [facility];  Service: Cardiovascular;  Laterality: N/A;  . THROAT SURGERY    . TONSILECTOMY/ADENOIDECTOMY WITH MYRINGOTOMY      Current Medications: Current Meds  Medication Sig  . aspirin EC 81 MG tablet Take 81 mg by mouth daily. Swallow whole.  Marland Kitchen atorvastatin (LIPITOR) 80 MG tablet TAKE 1 TABLET EVERY DAY  . clopidogrel (PLAVIX) 75 MG tablet TAKE 1 TABLET EVERY DAY  . furosemide (LASIX) 40 MG tablet Take 1 tablet (40 mg total) by mouth daily.  Marland Kitchen levothyroxine (SYNTHROID)  100 MCG tablet   . minocycline (MINOCIN) 50 MG capsule TAKE 1 CAPSULE EVERY 12 HOURS  . Omega-3 Fatty Acids (FISH OIL) 1200 MG CPDR Take 1 capsule by mouth daily.  Marland Kitchen omeprazole (PRILOSEC) 40 MG capsule Take 1 capsule by mouth daily.  Marland Kitchen rOPINIRole (REQUIP) 0.5 MG tablet   . [DISCONTINUED] amLODipine (NORVASC) 2.5 MG tablet TAKE 1 TABLET EVERY DAY     Allergies:   Patient has no known allergies.   Social History   Socioeconomic History  . Marital status: Divorced    Spouse name: Not on file  . Number of children: Not on file  . Years of education: Not on file  . Highest education level: Not on file  Occupational History  . Not on file  Tobacco Use  . Smoking status: Former Smoker    Types: Cigarettes    Quit date: 02/15/2007    Years since quitting: 12.9  . Smokeless tobacco: Former Systems developer    Types: Secondary school teacher  . Vaping Use: Never used  Substance and Sexual Activity  .  Alcohol use: No    Alcohol/week: 0.0 standard drinks  . Drug use: No  . Sexual activity: Yes    Partners: Female  Other Topics Concern  . Not on file  Social History Narrative  . Not on file   Social Determinants of Health   Financial Resource Strain:   . Difficulty of Paying Living Expenses: Not on file  Food Insecurity:   . Worried About Charity fundraiser in the Last Year: Not on file  . Ran Out of Food in the Last Year: Not on file  Transportation Needs:   . Lack of Transportation (Medical): Not on file  . Lack of Transportation (Non-Medical): Not on file  Physical Activity:   . Days of Exercise per Week: Not on file  . Minutes of Exercise per Session: Not on file  Stress:   . Feeling of Stress : Not on file  Social Connections:   . Frequency of Communication with Friends and Family: Not on file  . Frequency of Social Gatherings with Friends and Family: Not on file  . Attends Religious Services: Not on file  . Active Member of Clubs or Organizations: Not on file  . Attends Theatre manager Meetings: Not on file  . Marital Status: Not on file     Family History: The patient's family history includes Cancer in his sister; Deep vein thrombosis in his father; Heart disease in his father; Hypertension in his brother; Other in his father. ROS:   Please see the history of present illness.    All other systems reviewed and are negative.  EKGs/Labs/Other Studies Reviewed:    The following studies were reviewed today:    Recent Labs: 07/05/2019: TSH 2.140 10/08/2019: BNP 31.4; Hemoglobin 14.2; Platelets 273 12/02/2019: BUN 25; Creatinine, Ser 1.01; NT-Pro BNP 100; Potassium 4.3; Sodium 142 12/18/2019: ALT 23  Recent Lipid Panel    Component Value Date/Time   CHOL 122 10/08/2019 1000   TRIG 57 10/08/2019 1000   HDL 52 10/08/2019 1000   CHOLHDL 2.3 10/08/2019 1000   CHOLHDL 2.6 11/25/2014 0715   VLDL 12 11/25/2014 0715   LDLCALC 57 10/08/2019 1000    Physical Exam:    VS:  BP (!) 151/83   Pulse (!) 110   Ht 5\' 4"  (1.626 m)   Wt 198 lb 6.4 oz (90 kg)   SpO2 96%   BMI 34.06 kg/m     Wt Readings from Last 3 Encounters:  01/08/20 198 lb 6.4 oz (90 kg)  12/18/19 198 lb 6.4 oz (90 kg)  12/03/19 200 lb (90.7 kg)     GEN: He looks chronically ill and older than his age well nourished, well developed in no acute distress HEENT: Normal NECK: No JVD; No carotid bruits LYMPHATICS: No lymphadenopathy CARDIAC: RRR, no murmurs, rubs, gallops RESPIRATORY:  Clear to auscultation without rales, wheezing or rhonchi  ABDOMEN: Soft, non-tender, non-distended MUSCULOSKELETAL: Markedly improved but still 1+ bilateral pitting lower extremity edema; No deformity  SKIN: Warm and dry NEUROLOGIC:  Alert and oriented x 3 PSYCHIATRIC:  Normal affect    Signed, Shirlee More, MD  01/08/2020 1:59 PM    [facility]

## 2020-01-08 NOTE — Patient Instructions (Signed)
Medication Instructions:  Your physician has recommended you make the following change in your medication:  START: Cozaar 50 mg take one tablet by mouth daily.  STOP: Amlodipine *If you need a refill on your cardiac medications before your next appointment, please call your pharmacy*   Lab Work: Your physician recommends that you return for lab work in: [place] If you have labs (blood work) drawn today and your tests are completely normal, you will receive your results only by: Marland Kitchen MyChart Message (if you have MyChart) OR . A paper copy in the mail If you have any lab test that is abnormal or we need to change your treatment, we will call you to review the results.   Testing/Procedures: None   Follow-Up: At [facility], you and your health needs are our priority.  As part of our continuing mission to provide you with exceptional heart care, we have created designated Provider Care Teams.  These Care Teams include your primary Cardiologist (physician) and Advanced Practice Providers (APPs -  Physician Assistants and Nurse Practitioners) who all work together to provide you with the care you need, when you need it.  We recommend signing up for the patient portal called "MyChart".  Sign up information is provided on this After Visit Summary.  MyChart is used to connect with patients for Virtual Visits (Telemedicine).  Patients are able to view lab/test results, encounter notes, upcoming appointments, etc.  Non-urgent messages can be sent to your provider as well.   To learn more about what you can do with MyChart, go to [email or web address].    Your next appointment:   As needed  The format for your next appointment:   In Person  Provider:   Shirlee More, MD   Other Instructions

## 2020-01-09 LAB — BASIC METABOLIC PANEL
BUN/Creatinine Ratio: 13 (ref 10–24)
BUN: 16 mg/dL (ref 8–27)
CO2: 28 mmol/L (ref 20–29)
Calcium: 9.6 mg/dL (ref 8.6–10.2)
Chloride: 102 mmol/L (ref 96–106)
Creatinine, Ser: 1.19 mg/dL (ref 0.76–1.27)
GFR calc Af Amer: 70 mL/min/{1.73_m2} (ref >59)
GFR calc non Af Amer: 60 mL/min/{1.73_m2} (ref >59)
Glucose: 93 mg/dL (ref 65–99)
Potassium: 4.4 mmol/L (ref 3.5–5.2)
Sodium: 142 mmol/L (ref 134–144)

## 2020-01-09 LAB — PRO B NATRIURETIC PEPTIDE: NT-Pro BNP: 129 pg/mL (ref 0–376)

## 2020-01-13 ENCOUNTER — Telehealth: Payer: Self-pay

## 2020-01-13 NOTE — Telephone Encounter (Signed)
Spoke with patient regarding results and recommendation.  Patient verbalizes understanding and is agreeable to plan of care. Advised patient to call back with any issues or concerns.  

## 2020-01-13 NOTE — Telephone Encounter (Signed)
-----   Message from Richardo Priest, MD sent at 01/12/2020 11:28 AM EST ----- Good results no changes

## 2020-01-16 ENCOUNTER — Other Ambulatory Visit: Payer: Self-pay | Admitting: Legal Medicine

## 2020-01-17 ENCOUNTER — Other Ambulatory Visit: Payer: Self-pay

## 2020-01-18 ENCOUNTER — Other Ambulatory Visit: Payer: Self-pay | Admitting: Legal Medicine

## 2020-01-19 ENCOUNTER — Other Ambulatory Visit: Payer: Self-pay | Admitting: Legal Medicine

## 2020-01-21 ENCOUNTER — Other Ambulatory Visit: Payer: Self-pay | Admitting: Legal Medicine

## 2020-01-28 ENCOUNTER — Other Ambulatory Visit: Payer: Self-pay

## 2020-01-28 ENCOUNTER — Encounter: Payer: Self-pay | Admitting: Legal Medicine

## 2020-01-28 ENCOUNTER — Ambulatory Visit (INDEPENDENT_AMBULATORY_CARE_PROVIDER_SITE_OTHER): Payer: Medicare PPO | Admitting: Legal Medicine

## 2020-01-28 DIAGNOSIS — C329 Malignant neoplasm of larynx, unspecified: Secondary | ICD-10-CM | POA: Diagnosis not present

## 2020-01-28 DIAGNOSIS — Z0000 Encounter for general adult medical examination without abnormal findings: Secondary | ICD-10-CM | POA: Insufficient documentation

## 2020-01-28 NOTE — Progress Notes (Signed)
Subjective:  Patient ID: James Richards, male    DOB: [birth year]  Age: 74 y.o. MRN: [id number]  Chief Complaint  Patient presents with  . Annual Exam    AWV    HPI Encounter for general adult medical examination without abnormal findings  Physical ("At Risk" items are starred): Patient's last physical exam was 1 year ago .   Growth percentile SmartLinks can only be used for patients less than 51 years old.   SDOH Screenings   Alcohol Screen: Not on file  Depression (PHQ2-9): Low Risk   . PHQ-2 Score: 0  Financial Resource Strain: Not on file  Food Insecurity: Not on file  Housing: Not on file  Physical Activity: Inactive  . Days of Exercise per Week: 0 days  . Minutes of Exercise per Session: 0 min  Social Connections: Not on file  Stress: Not on file  Tobacco Use: Medium Risk  . Smoking Tobacco Use: Former Smoker  . Smokeless Tobacco Use: Former Soil scientist Needs: Not on file    Fall Risk  01/28/2020 07/05/2019  Falls in the past year? 0 -  Number falls in past yr: 0 0  Injury with Fall? 0 0  Risk for fall due to : No Fall Risks -  Follow up - Falls evaluation completed    Depression screen [facility] 2/9 01/28/2020 07/05/2019  Decreased Interest 0 0  Down, Depressed, Hopeless 0 0  PHQ - 2 Score 0 0    Functional Status Survey: Is the patient deaf or have difficulty hearing?: Yes Does the patient have difficulty seeing, even when wearing glasses/contacts?: No Does the patient have difficulty concentrating, remembering, or making decisions?: No Does the patient have difficulty walking or climbing stairs?: No Does the patient have difficulty dressing or bathing?: No Does the patient have difficulty doing errands alone such as visiting a doctor's office or shopping?: No   Safety: reviewed ;  Patient wears a seat belt. Patient's home has smoke detectors and carbon monoxide detectors. Patient practices appropriate gun safety Patient wears sunscreen with extended  sun exposure. Dental Care: biannual cleanings, brushes and flosses daily. Ophthalmology/Optometry: Annual visit.  Hearing loss: none Vision impairments: none Patient is not afflicted from Stress Incontinence and Urge Incontinence   Current Outpatient Medications on File Prior to Visit  Medication Sig Dispense Refill  . aspirin EC 81 MG tablet Take 81 mg by mouth daily. Swallow whole.    Marland Kitchen atorvastatin (LIPITOR) 80 MG tablet TAKE 1 TABLET EVERY DAY 90 tablet 2  . clopidogrel (PLAVIX) 75 MG tablet TAKE 1 TABLET EVERY DAY 90 tablet 2  . furosemide (LASIX) 20 MG tablet TAKE 1 TABLET(20 MG) BY MOUTH DAILY 90 tablet 1  . furosemide (LASIX) 40 MG tablet Take 1 tablet (40 mg total) by mouth daily. 90 tablet 3  . levothyroxine (SYNTHROID) 100 MCG tablet TAKE 1 TABLET EVERY DAY 90 tablet 2  . losartan (COZAAR) 50 MG tablet Take 1 tablet (50 mg total) by mouth daily. 90 tablet 3  . minocycline (MINOCIN) 50 MG capsule TAKE 1 CAPSULE EVERY 12 HOURS 180 capsule 2  . Omega-3 Fatty Acids (FISH OIL) 1200 MG CPDR Take 1 capsule by mouth daily.    Marland Kitchen omeprazole (PRILOSEC) 40 MG capsule TAKE 1 CAPSULE EVERY DAY 90 capsule 2  . rOPINIRole (REQUIP) 0.5 MG tablet      No current facility-administered medications on file prior to visit.    Social Hx   Social History  Socioeconomic History  . Marital status: Divorced    Spouse name: Not on file  . Number of children: Not on file  . Years of education: Not on file  . Highest education level: Not on file  Occupational History  . Not on file  Tobacco Use  . Smoking status: Former Smoker    Types: Cigarettes    Quit date: 02/15/2007    Years since quitting: 12.9  . Smokeless tobacco: Former Systems developer    Types: Secondary school teacher  . Vaping Use: Never used  Substance and Sexual Activity  . Alcohol use: No    Alcohol/week: 0.0 standard drinks  . Drug use: No  . Sexual activity: Not Currently    Partners: Female  Other Topics Concern  . Not on file  Social  History Narrative  . Not on file   Social Determinants of Health   Financial Resource Strain: Not on file  Food Insecurity: Not on file  Transportation Needs: Not on file  Physical Activity: Inactive  . Days of Exercise per Week: 0 days  . Minutes of Exercise per Session: 0 min  Stress: Not on file  Social Connections: Not on file   Past Medical History:  Diagnosis Date  . Atherosclerosis of native arteries of the extremities with intermittent claudication 10/09/2013  . Atherosclerotic peripheral vascular disease with intermittent claudication ([place]) 10/03/2012  . BMI 33.0-33.9,adult 07/07/2019  . Carotid stenosis 11/24/2014  . Diastolic dysfunction 35/57/3220  . Essential hypertension   . 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 History of recurrent TIAs 12/11/2014  . Impacted cerumen 11/26/2019  . Malignant neoplasm of base of tongue ([place]) 01/21/2015  . Malignant neoplasm of larynx, unspecified ([place])   . Migraine 04/02/2014  . Mixed hyperlipidemia   . Other fatigue   . Other specified hypothyroidism   . 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   . Restless legs syndrome   . Restrictive lung disease 11/26/2019  . TIA (transient ischemic attack) 11/24/2014   Family History  Problem Relation Age of Onset  . Other Father        amputation  . Deep vein thrombosis Father   . Heart disease Father        Atrial Fib.  . Cancer Sister   . Hypertension Brother     Review of Systems  Constitutional: Negative for activity change, appetite change and fever.  HENT: Positive for congestion. Negative for ear pain and sinus pain.   Eyes: Negative for visual disturbance.  Respiratory: Positive for stridor. Negative for cough and wheezing.   Cardiovascular: Negative for chest pain, palpitations and leg swelling.  Gastrointestinal: Negative for abdominal distention and abdominal pain.  Endocrine: Negative for polyuria.  Genitourinary: Negative for  difficulty urinating, dysuria and urgency.  Musculoskeletal: Negative for arthralgias and back pain.  Skin: Negative.   Neurological: Positive for dizziness and weakness. Negative for headaches.  Psychiatric/Behavioral: Negative.      Objective:  BP 124/70   Pulse 76   Temp (!) 96.9 F (36.1 C)   Resp 16   Ht 5\' 5"  (1.651 m)   Wt 203 lb (92.1 kg)   SpO2 98%   BMI 33.78 kg/m   BP/Weight 01/28/2020 01/08/2020 25/05/2704  Systolic BP 237 628 315  Diastolic BP 70 83 80  Wt. (Lbs) 203 198.4 198.4  BMI 33.78 34.06 34.06    Physical Exam  na  Lab Results  Component Value Date   WBC 6.4 10/08/2019  HGB 14.2 10/08/2019   HCT 41.4 10/08/2019   PLT 273 10/08/2019   GLUCOSE 93 01/08/2020   CHOL 122 10/08/2019   TRIG 57 10/08/2019   HDL 52 10/08/2019   LDLCALC 57 10/08/2019   ALT 23 12/18/2019   AST 21 12/18/2019   NA 142 01/08/2020   K 4.4 01/08/2020   CL 102 01/08/2020   CREATININE 1.19 01/08/2020   BUN 16 01/08/2020   CO2 28 01/08/2020   TSH 2.140 07/05/2019   INR 1.0 12/18/2019   HGBA1C 6.6 (H) 11/25/2014      Assessment & Plan:   Diagnoses and all orders for this visit: Malignant neoplasm of larynx, unspecified (HCC) Laryngeal neoplasm is stable and no recurrence  Routine general medical examination at a health care facility AWV performed and immunization reviewed    These are the goals we discussed: Goals   None      This is a list of the screening recommended for you and due dates:  Health Maintenance  Topic Date Due  .  Hepatitis C: One time screening is recommended by Center for Disease Control  (CDC) for  adults born from [birth year] through 1964.   Never done  . Tetanus Vaccine  Never done  . Colon Cancer Screening  Never done  . Pneumonia vaccines (1 of 2 - PCV13) Never done  . COVID-19 Vaccine (2 - Moderna risk 4-dose series) 01/28/2020  . Flu Shot  Completed      AN INDIVIDUALIZED CARE PLAN: was established or reinforced today.   SELF  MANAGEMENT: The patient and I together assessed ways to personally work towards obtaining the recommended goals  Support needs The patient and/or family needs were assessed and services were offered and not necessary at this time.    Follow-up: No follow-ups on file.  Reinaldo Meeker, MD [facility] [phone]

## 2020-02-10 ENCOUNTER — Ambulatory Visit: Payer: Medicare PPO | Admitting: Legal Medicine

## 2020-02-25 ENCOUNTER — Ambulatory Visit: Payer: Medicare PPO | Admitting: Legal Medicine

## 2020-03-03 ENCOUNTER — Ambulatory Visit: Payer: Medicare PPO | Admitting: Legal Medicine

## 2020-03-11 ENCOUNTER — Other Ambulatory Visit: Payer: Self-pay

## 2020-03-11 ENCOUNTER — Encounter: Payer: Self-pay | Admitting: Legal Medicine

## 2020-03-11 ENCOUNTER — Ambulatory Visit (INDEPENDENT_AMBULATORY_CARE_PROVIDER_SITE_OTHER): Payer: Medicare PPO | Admitting: Legal Medicine

## 2020-03-11 VITALS — BP 154/84 | HR 55 | Temp 97.7°F | Resp 16 | Ht 65.0 in | Wt 200.0 lb

## 2020-03-11 DIAGNOSIS — E782 Mixed hyperlipidemia: Secondary | ICD-10-CM

## 2020-03-11 DIAGNOSIS — I10 Essential (primary) hypertension: Secondary | ICD-10-CM

## 2020-03-11 DIAGNOSIS — I70219 Atherosclerosis of native arteries of extremities with intermittent claudication, unspecified extremity: Secondary | ICD-10-CM

## 2020-03-11 DIAGNOSIS — G2581 Restless legs syndrome: Secondary | ICD-10-CM | POA: Diagnosis not present

## 2020-03-11 DIAGNOSIS — E038 Other specified hypothyroidism: Secondary | ICD-10-CM

## 2020-03-11 DIAGNOSIS — C329 Malignant neoplasm of larynx, unspecified: Secondary | ICD-10-CM | POA: Diagnosis not present

## 2020-03-11 NOTE — Progress Notes (Signed)
Subjective:  Patient ID: James Richards, male    DOB: [birth year]  Age: 75 y.o. MRN: [id number]  Chief Complaint  Patient presents with  . Hyperlipidemia  . Hypothyroidism  . Hypertension    HPI: chronic visit  Patient presents for follow up of hypertension.  Patient tolerating losartan well with side effects.  Patient was diagnosed with hypertension 2010 so has been treated for hypertension for 10 years.Patient is working on maintaining diet and exercise regimen and follows up as directed. Complication include none . Patient presents with hyperlipidemia.  Compliance with treatment has been good; patient takes medicines as directed, maintains low cholesterol diet, follows up as directed, and maintains exercise regimen.  Patient is using atorvastatin without problems.  Patient has HYPOTHYROIDISM.  Diagnosed 10 years ago.  Patient has stable thyroid readings.  Patient is having no symptoms.  Last TSH was noemal.  continue dosage of thyroid medicine.   Current Outpatient Medications on File Prior to Visit  Medication Sig Dispense Refill  . aspirin EC 81 MG tablet Take 81 mg by mouth daily. Swallow whole.    Marland Kitchen atorvastatin (LIPITOR) 80 MG tablet TAKE 1 TABLET EVERY DAY 90 tablet 2  . clopidogrel (PLAVIX) 75 MG tablet TAKE 1 TABLET EVERY DAY 90 tablet 2  . furosemide (LASIX) 20 MG tablet TAKE 1 TABLET(20 MG) BY MOUTH DAILY 90 tablet 1  . levothyroxine (SYNTHROID) 100 MCG tablet TAKE 1 TABLET EVERY DAY 90 tablet 2  . losartan (COZAAR) 50 MG tablet Take 1 tablet (50 mg total) by mouth daily. 90 tablet 3  . minocycline (MINOCIN) 50 MG capsule TAKE 1 CAPSULE EVERY 12 HOURS 180 capsule 2  . Omega-3 Fatty Acids (FISH OIL) 1200 MG CPDR Take 1 capsule by mouth daily.    Marland Kitchen omeprazole (PRILOSEC) 40 MG capsule TAKE 1 CAPSULE EVERY DAY 90 capsule 2  . rOPINIRole (REQUIP) 0.5 MG tablet     . furosemide (LASIX) 40 MG tablet Take 1 tablet (40 mg total) by mouth daily. 90 tablet 3   No current  facility-administered medications on file prior to visit.   Past Medical History:  Diagnosis Date  . Atherosclerosis of native arteries of the extremities with intermittent claudication 10/09/2013  . Atherosclerotic peripheral vascular disease with intermittent claudication ([place]) 10/03/2012  . BMI 33.0-33.9,adult 07/07/2019  . Carotid stenosis 11/24/2014  . Diastolic dysfunction 25/06/3974  . Essential hypertension   . 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 History of recurrent TIAs 12/11/2014  . Impacted cerumen 11/26/2019  . Malignant neoplasm of base of tongue ([place]) 01/21/2015  . Malignant neoplasm of larynx, unspecified ([place])   . Migraine 04/02/2014  . Mixed hyperlipidemia   . Other fatigue   . Other specified hypothyroidism   . 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   . Restless legs syndrome   . Restrictive lung disease 11/26/2019  . TIA (transient ischemic attack) 11/24/2014   Past Surgical History:  Procedure Laterality Date  . LYMPHADENECTOMY    . PERIPHERAL VASCULAR CATHETERIZATION N/A 11/24/2014   Procedure: Aortic Arch Angiography;  Surgeon: Angelia Mould, MD;  Location: [place] [facility];  Service: Cardiovascular;  Laterality: N/A;  . PERIPHERAL VASCULAR CATHETERIZATION N/A 11/24/2014   Procedure: Cerebral Angiography;  Surgeon: Angelia Mould, MD;  Location: [place] [facility];  Service: Cardiovascular;  Laterality: N/A;  . THROAT SURGERY    . TONSILECTOMY/ADENOIDECTOMY WITH MYRINGOTOMY      Family History  Problem Relation Age of Onset  .  Other Father        amputation  . Deep vein thrombosis Father   . Heart disease Father        Atrial Fib.  . Cancer Sister   . Hypertension Brother    Social History   Socioeconomic History  . Marital status: Divorced    Spouse name: Not on file  . Number of children: Not on file  . Years of education: Not on file  . Highest education level: Not on file  Occupational  History  . Not on file  Tobacco Use  . Smoking status: Former Smoker    Types: Cigarettes    Quit date: 02/15/2007    Years since quitting: 13.0  . Smokeless tobacco: Former NeurosurgeonUser    Types: Engineer, drillingChew  Vaping Use  . Vaping Use: Never used  Substance and Sexual Activity  . Alcohol use: No    Alcohol/week: 0.0 standard drinks  . Drug use: No  . Sexual activity: Not Currently    Partners: Female  Other Topics Concern  . Not on file  Social History Narrative  . Not on file   Social Determinants of Health   Financial Resource Strain: Low Risk   . Difficulty of Paying Living Expenses: Not hard at all  Food Insecurity: No Food Insecurity  . Worried About Programme researcher, broadcasting/film/videounning Out of Food in the Last Year: Never true  . Ran Out of Food in the Last Year: Never true  Transportation Needs: No Transportation Needs  . Lack of Transportation (Medical): No  . Lack of Transportation (Non-Medical): No  Physical Activity: Inactive  . Days of Exercise per Week: 0 days  . Minutes of Exercise per Session: 0 min  Stress: No Stress Concern Present  . Feeling of Stress : Not at all  Social Connections: Socially Integrated  . Frequency of Communication with Friends and Family: More than three times a week  . Frequency of Social Gatherings with Friends and Family: Twice a week  . Attends Religious Services: More than 4 times per year  . Active Member of Clubs or Organizations: Yes  . Attends BankerClub or Organization Meetings: More than 4 times per year  . Marital Status: Married    Review of Systems  Constitutional: Negative for activity change and diaphoresis.  HENT: Negative for congestion and sinus pain.   Eyes: Negative for visual disturbance.  Respiratory: Negative for chest tightness and shortness of breath.   Cardiovascular: Negative for chest pain, palpitations and leg swelling.  Gastrointestinal: Negative for abdominal distention and abdominal pain.  Endocrine: Negative for polyuria.  Genitourinary:  Negative for difficulty urinating, dysuria and urgency.  Musculoskeletal: Negative for arthralgias and back pain.  Skin: Negative.   Neurological: Negative.   Psychiatric/Behavioral: Negative.      Objective:  BP (!) 154/84   Pulse (!) 55   Temp 97.7 F (36.5 C)   Resp 16   Ht 5\' 5"  (1.651 m)   Wt 200 lb (90.7 kg)   SpO2 97%   BMI 33.28 kg/m   BP/Weight 03/11/2020 01/28/2020 01/08/2020  Systolic BP 154 124 151  Diastolic BP 84 70 83  Wt. (Lbs) 200 203 198.4  BMI 33.28 33.78 34.06    Physical Exam Vitals reviewed.  Constitutional:      Appearance: Normal appearance.  HENT:     Head: Normocephalic and atraumatic.     Right Ear: Tympanic membrane normal.     Left Ear: Tympanic membrane normal.     Nose: Nose  normal.     Mouth/Throat:     Mouth: Mucous membranes are moist.     Pharynx: Oropharynx is clear.  Eyes:     Extraocular Movements: Extraocular movements intact.     Conjunctiva/sclera: Conjunctivae normal.     Pupils: Pupils are equal, round, and reactive to light.  Cardiovascular:     Rate and Rhythm: Normal rate and regular rhythm.     Pulses: Normal pulses.     Heart sounds: No murmur heard. No gallop.   Pulmonary:     Effort: Pulmonary effort is normal. No respiratory distress.     Breath sounds: No wheezing.  Abdominal:     General: Abdomen is flat. Bowel sounds are normal. There is no distension.     Tenderness: There is no abdominal tenderness.  Musculoskeletal:        General: No tenderness. Normal range of motion.     Cervical back: Normal range of motion and neck supple.  Skin:    General: Skin is warm.     Capillary Refill: Capillary refill takes less than 2 seconds.  Neurological:     General: No focal deficit present.     Mental Status: He is alert and oriented to person, place, and time. Mental status is at baseline.  Psychiatric:        Mood and Affect: Mood normal.        Behavior: Behavior normal.        Thought Content: Thought  content normal.        Judgment: Judgment normal.       Lab Results  Component Value Date   WBC 6.4 10/08/2019   HGB 14.2 10/08/2019   HCT 41.4 10/08/2019   PLT 273 10/08/2019   GLUCOSE 93 01/08/2020   CHOL 122 10/08/2019   TRIG 57 10/08/2019   HDL 52 10/08/2019   LDLCALC 57 10/08/2019   ALT 23 12/18/2019   AST 21 12/18/2019   NA 142 01/08/2020   K 4.4 01/08/2020   CL 102 01/08/2020   CREATININE 1.19 01/08/2020   BUN 16 01/08/2020   CO2 28 01/08/2020   TSH 2.140 07/05/2019   INR 1.0 12/18/2019   HGBA1C 6.6 (H) 11/25/2014      Assessment & Plan:   1. Atherosclerotic peripheral vascular disease with intermittent claudication ([place]) He is not having any claudication and doing well.  2. Mixed hyperlipidemia - Comprehensive metabolic panel AN INDIVIDUAL CARE PLAN for hyperlipidemia/ cholesterol was established and reinforced today.  The patient's status was assessed using clinical findings on exam, lab and other diagnostic tests. The patient's disease status was assessed based on evidence-based guidelines and found to be well controlled. MEDICATIONS were reviewed. SELF MANAGEMENT GOALS have been discussed and patient's success at attaining the goal of low cholesterol was assessed. RECOMMENDATION given include regular exercise 3 days a week and low cholesterol/low fat diet. CLINICAL SUMMARY including written plan to identify barriers unique to the patient due to social or economic  reasons was discussed.  3. Essential hypertension - Lipid panel - CBC with Differential/Platelet An individual hypertension care plan was established and reinforced today.  The patient's status was assessed using clinical findings on exam and labs or diagnostic tests. The patient's success at meeting treatment goals on disease specific evidence-based guidelines and found to be well controlled. SELF MANAGEMENT: The patient and I together assessed ways to personally work towards obtaining the  recommended goals. RECOMMENDATIONS: avoid decongestants found in common cold remedies, decrease consumption of  alcohol, perform routine monitoring of BP with home BP cuff, exercise, reduction of dietary salt, take medicines as prescribed, try not to miss doses and quit smoking.  Regular exercise and maintaining a healthy weight is needed.  Stress reduction may help. A CLINICAL SUMMARY including written plan identify barriers to care unique to individual due to social or financial issues.  We attempt to mutually creat solutions for individual and family understanding.  4. Other specified hypothyroidism - TSH .Patient is known to have hypothyroidism and is n treatment with levothyroxine.119mcg  Patient was diagnosed 10 years ago.  Other treatment includes na.  Patient is compliant with medicines and last TSH 6 months ago.  Last TSH was normal.  5. Restless legs syndrome AN INDIVIDUAL CARE PLAN for RLS was established and reinforced today.  The patient's status was assessed using clinical findings on exam, labs, and other diagnostic testing. Patient's success at meeting treatment goals based on disease specific evidence-bassed guidelines and found to be in good control. RECOMMENDATIONS include maintaining present medicines and treatment.  6. Malignant neoplasm of larynx, unspecified (HCC) No recurrence of his laryngeal carcinoma      Orders Placed This Encounter  Procedures  . Comprehensive metabolic panel  . Lipid panel  . CBC with Differential/Platelet  . TSH      I spent 30 minutes dedicated to the care of this patient on the date of this encounter to include face-to-face time with the patient, as well as: review of records  Follow-up: Return in about 4 months (around 07/09/2020) for fasting.  An After Visit Summary was printed and given to the patient.  Reinaldo Meeker, MD [facility] [phone]

## 2020-03-12 LAB — COMPREHENSIVE METABOLIC PANEL
ALT: 22 IU/L (ref 0–44)
AST: 26 IU/L (ref 0–40)
Albumin/Globulin Ratio: 1.2 (ref 1.2–2.2)
Albumin: 3.6 g/dL — ABNORMAL LOW (ref 3.7–4.7)
Alkaline Phosphatase: 79 IU/L (ref 44–121)
BUN/Creatinine Ratio: 15 (ref 10–24)
BUN: 15 mg/dL (ref 8–27)
Bilirubin Total: 0.4 mg/dL (ref 0.0–1.2)
CO2: 26 mmol/L (ref 20–29)
Calcium: 9.7 mg/dL (ref 8.6–10.2)
Chloride: 104 mmol/L (ref 96–106)
Creatinine, Ser: 1.03 mg/dL (ref 0.76–1.27)
GFR calc Af Amer: 83 mL/min/{1.73_m2} (ref >59)
GFR calc non Af Amer: 72 mL/min/{1.73_m2} (ref >59)
Globulin, Total: 2.9 g/dL (ref 1.5–4.5)
Glucose: 95 mg/dL (ref 65–99)
Potassium: 4.5 mmol/L (ref 3.5–5.2)
Sodium: 144 mmol/L (ref 134–144)
Total Protein: 6.5 g/dL (ref 6.0–8.5)

## 2020-03-12 LAB — CBC WITH DIFFERENTIAL/PLATELET
Basophils Absolute: 0.1 10*3/uL (ref 0.0–0.2)
Basos: 1 %
EOS (ABSOLUTE): 0.3 10*3/uL (ref 0.0–0.4)
Eos: 5 %
Hematocrit: 39.3 % (ref 37.5–51.0)
Hemoglobin: 12.7 g/dL — ABNORMAL LOW (ref 13.0–17.7)
Immature Grans (Abs): 0 10*3/uL (ref 0.0–0.1)
Immature Granulocytes: 0 %
Lymphocytes Absolute: 1 10*3/uL (ref 0.7–3.1)
Lymphs: 16 %
MCH: 28.9 pg (ref 26.6–33.0)
MCHC: 32.3 g/dL (ref 31.5–35.7)
MCV: 89 fL (ref 79–97)
Monocytes Absolute: 0.6 10*3/uL (ref 0.1–0.9)
Monocytes: 11 %
Neutrophils Absolute: 4 10*3/uL (ref 1.4–7.0)
Neutrophils: 67 %
Platelets: 266 10*3/uL (ref 150–450)
RBC: 4.4 x10E6/uL (ref 4.14–5.80)
RDW: 14.6 % (ref 11.6–15.4)
WBC: 5.9 10*3/uL (ref 3.4–10.8)

## 2020-03-12 LAB — LIPID PANEL
Chol/HDL Ratio: 2.2 ratio (ref 0.0–5.0)
Cholesterol, Total: 101 mg/dL (ref 100–199)
HDL: 46 mg/dL (ref >39)
LDL Chol Calc (NIH): 39 mg/dL (ref 0–99)
Triglycerides: 76 mg/dL (ref 0–149)
VLDL Cholesterol Cal: 16 mg/dL (ref 5–40)

## 2020-03-12 LAB — CARDIOVASCULAR RISK ASSESSMENT

## 2020-03-12 LAB — TSH: TSH: 17.4 u[IU]/mL — ABNORMAL HIGH (ref 0.450–4.500)

## 2020-03-12 NOTE — Progress Notes (Signed)
Kidney and liver tests normal, Cholesterol normal, mild anemia, TSH 17.4 high, did he miss thyroid doses,  lp

## 2020-03-13 ENCOUNTER — Other Ambulatory Visit: Payer: Self-pay | Admitting: Legal Medicine

## 2020-03-13 ENCOUNTER — Other Ambulatory Visit: Payer: Self-pay

## 2020-03-13 DIAGNOSIS — E038 Other specified hypothyroidism: Secondary | ICD-10-CM

## 2020-03-13 MED ORDER — LEVOTHYROXINE SODIUM 125 MCG PO TABS: 125.0000 ug | ORAL_TABLET | Freq: Every day | ORAL | 2 refills | Status: DC

## 2020-03-13 NOTE — Progress Notes (Signed)
I called in 179mcg dose to take once a day, stop 164mcg dose and recheck TSH in 6 weeks lp

## 2020-04-26 ENCOUNTER — Other Ambulatory Visit: Payer: Self-pay | Admitting: Legal Medicine

## 2020-04-27 ENCOUNTER — Other Ambulatory Visit: Payer: Self-pay | Admitting: Legal Medicine

## 2020-04-27 DIAGNOSIS — E038 Other specified hypothyroidism: Secondary | ICD-10-CM

## 2020-04-27 DIAGNOSIS — G2581 Restless legs syndrome: Secondary | ICD-10-CM

## 2020-06-16 ENCOUNTER — Ambulatory Visit: Payer: Medicare PPO | Admitting: Legal Medicine

## 2020-06-16 ENCOUNTER — Other Ambulatory Visit: Payer: Self-pay

## 2020-06-16 ENCOUNTER — Telehealth: Payer: Self-pay

## 2020-06-16 ENCOUNTER — Ambulatory Visit (INDEPENDENT_AMBULATORY_CARE_PROVIDER_SITE_OTHER): Payer: Medicare PPO

## 2020-06-16 DIAGNOSIS — Z23 Encounter for immunization: Secondary | ICD-10-CM | POA: Diagnosis not present

## 2020-06-16 NOTE — Progress Notes (Signed)
   Covid-19 Vaccination Clinic  Name:  James Richards    MRN: [id number] DOB: 1945-06-16  06/16/2020  [name] was observed post Covid-19 immunization for 15 minutes without incident. He was provided with Vaccine Information Sheet and instruction to access the V-Safe system.   [name] was instructed to call 911 with any severe reactions post vaccine: Marland Kitchen Difficulty breathing  . Swelling of face and throat  . A fast heartbeat  . A bad rash all over body  . Dizziness and weakness   Immunizations Administered    Name Date Dose VIS Date Route   Moderna Covid-19 Booster Vaccine 06/16/2020 10:45 AM 0.25 mL 12/04/2019 Intramuscular   Manufacturer: Moderna   Lot: [id number]   [place]: [phone]

## 2020-06-16 NOTE — Telephone Encounter (Signed)
I left a message on the number(s) listed in the patients chart requesting the patient to call back to rescheduled appointment for 06/16/2020. The provider is out of the office. Appointment has been canceled. Waiting for the patient to return the call.

## 2020-06-23 ENCOUNTER — Other Ambulatory Visit: Payer: Self-pay

## 2020-06-23 ENCOUNTER — Encounter: Payer: Self-pay | Admitting: Legal Medicine

## 2020-06-23 ENCOUNTER — Ambulatory Visit: Payer: Medicare PPO | Admitting: Legal Medicine

## 2020-06-23 VITALS — BP 148/80 | HR 68 | Temp 98.4°F | Resp 16 | Ht 65.0 in | Wt 202.0 lb

## 2020-06-23 DIAGNOSIS — I110 Hypertensive heart disease with heart failure: Secondary | ICD-10-CM | POA: Diagnosis not present

## 2020-06-23 DIAGNOSIS — I5032 Chronic diastolic (congestive) heart failure: Secondary | ICD-10-CM

## 2020-06-23 DIAGNOSIS — E038 Other specified hypothyroidism: Secondary | ICD-10-CM | POA: Diagnosis not present

## 2020-06-23 DIAGNOSIS — H906 Mixed conductive and sensorineural hearing loss, bilateral: Secondary | ICD-10-CM

## 2020-06-23 DIAGNOSIS — H908 Mixed conductive and sensorineural hearing loss, unspecified: Secondary | ICD-10-CM | POA: Insufficient documentation

## 2020-06-23 NOTE — Progress Notes (Signed)
Subjective:  Patient ID: James Richards, male    DOB: 28-Mar-1945  Age: 75 y.o. MRN: [id number]  Chief Complaint  Patient presents with  . Hearing Loss  . Hypothyroidism    HPI: patient is having increased hearing problems. He needs new hearing aids.  He has good bone conductions.  His thyroid medicines have been changed, he needs TSH level   Current Outpatient Medications on File Prior to Visit  Medication Sig Dispense Refill  . aspirin EC 81 MG tablet Take 81 mg by mouth daily. Swallow whole.    Marland Kitchen atorvastatin (LIPITOR) 80 MG tablet TAKE 1 TABLET EVERY DAY 90 tablet 2  . clopidogrel (PLAVIX) 75 MG tablet TAKE 1 TABLET EVERY DAY 90 tablet 2  . levothyroxine (SYNTHROID) 125 MCG tablet Take 1 tablet (125 mcg total) by mouth daily. 90 tablet 2  . minocycline (MINOCIN) 50 MG capsule TAKE 1 CAPSULE EVERY 12 HOURS 180 capsule 2  . Omega-3 Fatty Acids (FISH OIL) 1200 MG CPDR Take 1 capsule by mouth daily.    Marland Kitchen omeprazole (PRILOSEC) 40 MG capsule TAKE 1 CAPSULE EVERY DAY 90 capsule 2  . rOPINIRole (REQUIP) 0.5 MG tablet TAKE 1 TABLET AT BEDTIME 90 tablet 2  . furosemide (LASIX) 40 MG tablet Take 1 tablet (40 mg total) by mouth daily. 90 tablet 3  . losartan (COZAAR) 50 MG tablet Take 1 tablet (50 mg total) by mouth daily. 90 tablet 3   No current facility-administered medications on file prior to visit.   Past Medical History:  Diagnosis Date  . Atherosclerosis of native arteries of the extremities with intermittent claudication 10/09/2013  . Atherosclerotic peripheral vascular disease with intermittent claudication ([place]) 10/03/2012  . BMI 33.0-33.9,adult 07/07/2019  . Carotid stenosis 11/24/2014  . Diastolic dysfunction 73/22/0254  . Essential hypertension   . 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 History of recurrent TIAs 12/11/2014  . Impacted cerumen 11/26/2019  . Malignant neoplasm of base of tongue ([place]) 01/21/2015  . Malignant neoplasm of larynx, unspecified ([place])   .  Migraine 04/02/2014  . Mixed hyperlipidemia   . Other fatigue   . Other specified hypothyroidism   . 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   . Restless legs syndrome   . Restrictive lung disease 11/26/2019  . TIA (transient ischemic attack) 11/24/2014   Past Surgical History:  Procedure Laterality Date  . LYMPHADENECTOMY    . PERIPHERAL VASCULAR CATHETERIZATION N/A 11/24/2014   Procedure: Aortic Arch Angiography;  Surgeon: Angelia Mould, MD;  Location: [facility];  Service: Cardiovascular;  Laterality: N/A;  . PERIPHERAL VASCULAR CATHETERIZATION N/A 11/24/2014   Procedure: Cerebral Angiography;  Surgeon: Angelia Mould, MD;  Location: [facility];  Service: Cardiovascular;  Laterality: N/A;  . THROAT SURGERY    . TONSILECTOMY/ADENOIDECTOMY WITH MYRINGOTOMY      Family History  Problem Relation Age of Onset  . Other Father        amputation  . Deep vein thrombosis Father   . Heart disease Father        Atrial Fib.  . Cancer Sister   . Hypertension Brother    Social History   Socioeconomic History  . Marital status: Divorced    Spouse name: Not on file  . Number of children: Not on file  . Years of education: Not on file  . Highest education level: Not on file  Occupational History  . Not on file  Tobacco Use  .  Smoking status: Former Smoker    Types: Cigarettes    Quit date: 02/15/2007    Years since quitting: 13.3  . Smokeless tobacco: Former Systems developer    Types: Secondary school teacher  . Vaping Use: Never used  Substance and Sexual Activity  . Alcohol use: No    Alcohol/week: 0.0 standard drinks  . Drug use: No  . Sexual activity: Not Currently    Partners: Female  Other Topics Concern  . Not on file  Social History Narrative  . Not on file   Social Determinants of Health   Financial Resource Strain: Low Risk   . Difficulty of Paying Living Expenses: Not hard at all  Food Insecurity: No Food Insecurity  .  Worried About Charity fundraiser in the Last Year: Never true  . Ran Out of Food in the Last Year: Never true  Transportation Needs: No Transportation Needs  . Lack of Transportation (Medical): No  . Lack of Transportation (Non-Medical): No  Physical Activity: Inactive  . Days of Exercise per Week: 0 days  . Minutes of Exercise per Session: 0 min  Stress: No Stress Concern Present  . Feeling of Stress : Not at all  Social Connections: Socially Integrated  . Frequency of Communication with Friends and Family: More than three times a week  . Frequency of Social Gatherings with Friends and Family: Twice a week  . Attends Religious Services: More than 4 times per year  . Active Member of Clubs or Organizations: Yes  . Attends Archivist Meetings: More than 4 times per year  . Marital Status: Married    Review of Systems  Constitutional: Negative for activity change and appetite change.  HENT: Positive for hearing loss. Negative for congestion and rhinorrhea.   Eyes: Negative for visual disturbance.  Respiratory: Negative for chest tightness and shortness of breath.   Cardiovascular: Negative for chest pain, palpitations and leg swelling.  Gastrointestinal: Negative for abdominal distention and abdominal pain.  Endocrine: Negative for polyuria.  Genitourinary: Negative for difficulty urinating, dysuria and urgency.  Musculoskeletal: Negative for arthralgias and back pain.  Skin: Negative.   Neurological: Negative.   Psychiatric/Behavioral: Negative.      Objective:  BP (!) 148/80   Pulse 68   Temp 98.4 F (36.9 C)   Resp 16   Ht 5\' 5"  (1.651 m)   Wt 202 lb (91.6 kg)   SpO2 97%   BMI 33.61 kg/m   BP/Weight 06/23/2020 03/11/2020 56/31/4970  Systolic BP 263 785 885  Diastolic BP 80 84 70  Wt. (Lbs) 202 200 203  BMI 33.61 33.28 33.78    Physical Exam Vitals reviewed.  Constitutional:      Appearance: Normal appearance. He is obese.  HENT:     Head:  Normocephalic.     Right Ear: Tympanic membrane, ear canal and external ear normal.     Left Ear: Tympanic membrane, ear canal and external ear normal.     Nose: Nose normal.     Mouth/Throat:     Mouth: Mucous membranes are moist.     Pharynx: Oropharynx is clear.  Eyes:     Conjunctiva/sclera: Conjunctivae normal.     Pupils: Pupils are equal, round, and reactive to light.  Cardiovascular:     Rate and Rhythm: Normal rate and regular rhythm.     Pulses: Normal pulses.     Heart sounds: Normal heart sounds. No murmur heard. No gallop.   Pulmonary:  Effort: Pulmonary effort is normal. No respiratory distress.     Breath sounds: Normal breath sounds. No rales.  Abdominal:     General: Abdomen is flat. There is no distension.     Palpations: Abdomen is soft.     Tenderness: There is no abdominal tenderness.  Musculoskeletal:     Cervical back: Normal range of motion and neck supple.  Skin:    General: Skin is warm.     Capillary Refill: Capillary refill takes less than 2 seconds.  Neurological:     General: No focal deficit present.     Mental Status: He is alert and oriented to person, place, and time.  Psychiatric:        Mood and Affect: Mood normal.       Lab Results  Component Value Date   WBC 5.9 03/11/2020   HGB 12.7 (L) 03/11/2020   HCT 39.3 03/11/2020   PLT 266 03/11/2020   GLUCOSE 95 03/11/2020   CHOL 101 03/11/2020   TRIG 76 03/11/2020   HDL 46 03/11/2020   LDLCALC 39 03/11/2020   ALT 22 03/11/2020   AST 26 03/11/2020   NA 144 03/11/2020   K 4.5 03/11/2020   CL 104 03/11/2020   CREATININE 1.03 03/11/2020   BUN 15 03/11/2020   CO2 26 03/11/2020   TSH 17.400 (H) 03/11/2020   INR 1.0 12/18/2019   HGBA1C 6.6 (H) 11/25/2014      Assessment & Plan:   Diagnoses and all orders for this visit: Hypertensive heart disease with chronic diastolic congestive heart failure ([place]) An individualized care plan was established and reinforced.  The patient's  disease status was assessed using clinical finding son exam today, labs, and/or other diagnostic testing such as x-rays, to determine the patient's success in meeting treatmentgoalsbased on disease-based guidelines and found to beimproving. But not at goal yet. Medications prescriptions nochanges Laboratory tests ordered to be performed today include routine. RECOMMENDATIONS: given include see cardiology.  Call physician is patient gains 3 lbs in one day or 5 lbs for one week.  Call for progressive PND, orthopnea or increased pedal edema.  Other specified hypothyroidism -     TSH -   Check thyroid after changing dose  Mixed conductive and sensorineural hearing loss of both ears -     Ambulatory referral to ENT Hearing aids not working, he wast ENT workup for this loss       Follow-up: Return as scheduled.  An After Visit Summary was printed and given to the patient.  Reinaldo Meeker, MD [facility] [phone]

## 2020-06-24 LAB — TSH: TSH: 3.11 u[IU]/mL (ref 0.450–4.500)

## 2020-06-24 NOTE — Progress Notes (Signed)
TSH 3.11 now normal, continue thyroid dose lp

## 2020-07-08 ENCOUNTER — Other Ambulatory Visit: Payer: Self-pay | Admitting: Legal Medicine

## 2020-07-08 DIAGNOSIS — E782 Mixed hyperlipidemia: Secondary | ICD-10-CM

## 2020-07-08 DIAGNOSIS — G459 Transient cerebral ischemic attack, unspecified: Secondary | ICD-10-CM

## 2020-07-09 ENCOUNTER — Ambulatory Visit: Payer: Medicare PPO | Admitting: Legal Medicine

## 2020-07-09 ENCOUNTER — Encounter: Payer: Self-pay | Admitting: Legal Medicine

## 2020-07-09 ENCOUNTER — Other Ambulatory Visit: Payer: Self-pay

## 2020-07-09 VITALS — BP 120/96 | HR 65 | Temp 97.8°F | Resp 16 | Ht 65.0 in | Wt 202.0 lb

## 2020-07-09 DIAGNOSIS — E038 Other specified hypothyroidism: Secondary | ICD-10-CM

## 2020-07-09 DIAGNOSIS — E782 Mixed hyperlipidemia: Secondary | ICD-10-CM

## 2020-07-09 DIAGNOSIS — Z6833 Body mass index (BMI) 33.0-33.9, adult: Secondary | ICD-10-CM | POA: Diagnosis not present

## 2020-07-09 DIAGNOSIS — G459 Transient cerebral ischemic attack, unspecified: Secondary | ICD-10-CM | POA: Diagnosis not present

## 2020-07-09 DIAGNOSIS — C329 Malignant neoplasm of larynx, unspecified: Secondary | ICD-10-CM | POA: Diagnosis not present

## 2020-07-09 DIAGNOSIS — I110 Hypertensive heart disease with heart failure: Secondary | ICD-10-CM | POA: Diagnosis not present

## 2020-07-09 DIAGNOSIS — I5032 Chronic diastolic (congestive) heart failure: Secondary | ICD-10-CM | POA: Diagnosis not present

## 2020-07-09 DIAGNOSIS — H906 Mixed conductive and sensorineural hearing loss, bilateral: Secondary | ICD-10-CM | POA: Diagnosis not present

## 2020-07-09 DIAGNOSIS — I70219 Atherosclerosis of native arteries of extremities with intermittent claudication, unspecified extremity: Secondary | ICD-10-CM

## 2020-07-09 DIAGNOSIS — J984 Other disorders of lung: Secondary | ICD-10-CM

## 2020-07-09 NOTE — Progress Notes (Signed)
Subjective:  Patient ID: James Richards, male    DOB: 1946-01-25  Age: 75 y.o. MRN: [id number]  Chief Complaint  Patient presents with  . Hyperlipidemia  . Hypothyroidism  . Gastroesophageal Reflux    HPI: chronic visit  Patient presents with hyperlipidemia.  Compliance with treatment has been good; patient takes medicines as directed, maintains low cholesterol diet, follows up as directed, and maintains exercise regimen.  Patient is using atorvastatin without problems.  Patient has HYPOTHYROIDISM.  Diagnosed 10 years ago.  Patient has stable thyroid readings.  Patient is having no symptoms.  Last TSH was normal.  continue dosage of thyroid medicine.  Patient has gastroesophageal reflux symptoms withesophagitis and LTRD.  The symptoms are moderate intensity.  Length of symptoms 10 years.  Medicines include omeprazole.  Complications include none  No recurrence of tongue or laryngeal cancer.   Current Outpatient Medications on File Prior to Visit  Medication Sig Dispense Refill  . aspirin EC 81 MG tablet Take 81 mg by mouth daily. Swallow whole.    Marland Kitchen atorvastatin (LIPITOR) 80 MG tablet TAKE 1 TABLET EVERY DAY 90 tablet 2  . clopidogrel (PLAVIX) 75 MG tablet TAKE 1 TABLET EVERY DAY 90 tablet 2  . levothyroxine (SYNTHROID) 125 MCG tablet Take 1 tablet (125 mcg total) by mouth daily. 90 tablet 2  . minocycline (MINOCIN) 50 MG capsule TAKE 1 CAPSULE EVERY 12 HOURS 180 capsule 2  . Omega-3 Fatty Acids (FISH OIL) 1200 MG CPDR Take 1 capsule by mouth daily.    Marland Kitchen omeprazole (PRILOSEC) 40 MG capsule TAKE 1 CAPSULE EVERY DAY 90 capsule 2  . rOPINIRole (REQUIP) 0.5 MG tablet TAKE 1 TABLET AT BEDTIME 90 tablet 2  . furosemide (LASIX) 40 MG tablet Take 1 tablet (40 mg total) by mouth daily. 90 tablet 3  . losartan (COZAAR) 50 MG tablet Take 1 tablet (50 mg total) by mouth daily. 90 tablet 3   No current facility-administered medications on file prior to visit.   Past Medical History:   Diagnosis Date  . Atherosclerosis of native arteries of the extremities with intermittent claudication 10/09/2013  . Atherosclerotic peripheral vascular disease with intermittent claudication ([place]) 10/03/2012  . BMI 33.0-33.9,adult 07/07/2019  . Carotid stenosis 11/24/2014  . Diastolic dysfunction 46/96/2952  . Essential hypertension   . 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 History of recurrent TIAs 12/11/2014  . Impacted cerumen 11/26/2019  . Malignant neoplasm of base of tongue ([place]) 01/21/2015  . Malignant neoplasm of larynx, unspecified ([place])   . Migraine 04/02/2014  . Mixed hyperlipidemia   . Other fatigue   . Other specified hypothyroidism   . 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   . Restless legs syndrome   . Restrictive lung disease 11/26/2019  . TIA (transient ischemic attack) 11/24/2014   Past Surgical History:  Procedure Laterality Date  . LYMPHADENECTOMY    . PERIPHERAL VASCULAR CATHETERIZATION N/A 11/24/2014   Procedure: Aortic Arch Angiography;  Surgeon: Angelia Mould, MD;  Location: [facility];  Service: Cardiovascular;  Laterality: N/A;  . PERIPHERAL VASCULAR CATHETERIZATION N/A 11/24/2014   Procedure: Cerebral Angiography;  Surgeon: Angelia Mould, MD;  Location: [facility];  Service: Cardiovascular;  Laterality: N/A;  . THROAT SURGERY    . TONSILECTOMY/ADENOIDECTOMY WITH MYRINGOTOMY      Family History  Problem Relation Age of Onset  . Other Father        amputation  . Deep vein thrombosis Father   .  Heart disease Father        Atrial Fib.  . Cancer Sister   . Hypertension Brother    Social History   Socioeconomic History  . Marital status: Divorced    Spouse name: Not on file  . Number of children: Not on file  . Years of education: Not on file  . Highest education level: Not on file  Occupational History  . Not on file  Tobacco Use  . Smoking status: Former Smoker    Types:  Cigarettes    Quit date: 02/15/2007    Years since quitting: 13.4  . Smokeless tobacco: Former Systems developer    Types: Secondary school teacher  . Vaping Use: Never used  Substance and Sexual Activity  . Alcohol use: No    Alcohol/week: 0.0 standard drinks  . Drug use: No  . Sexual activity: Not Currently    Partners: Female  Other Topics Concern  . Not on file  Social History Narrative  . Not on file   Social Determinants of Health   Financial Resource Strain: Low Risk   . Difficulty of Paying Living Expenses: Not hard at all  Food Insecurity: No Food Insecurity  . Worried About Charity fundraiser in the Last Year: Never true  . Ran Out of Food in the Last Year: Never true  Transportation Needs: No Transportation Needs  . Lack of Transportation (Medical): No  . Lack of Transportation (Non-Medical): No  Physical Activity: Inactive  . Days of Exercise per Week: 0 days  . Minutes of Exercise per Session: 0 min  Stress: No Stress Concern Present  . Feeling of Stress : Not at all  Social Connections: Socially Integrated  . Frequency of Communication with Friends and Family: More than three times a week  . Frequency of Social Gatherings with Friends and Family: Twice a week  . Attends Religious Services: More than 4 times per year  . Active Member of Clubs or Organizations: Yes  . Attends Archivist Meetings: More than 4 times per year  . Marital Status: Married    Review of Systems  Constitutional: Negative for activity change and appetite change.  HENT: Positive for hearing loss. Negative for congestion.   Eyes: Negative for visual disturbance.  Respiratory: Negative for chest tightness and shortness of breath.   Cardiovascular: Negative for chest pain, palpitations and leg swelling.  Gastrointestinal: Negative for abdominal distention and abdominal pain.  Endocrine: Negative for polyuria.  Genitourinary: Negative.  Negative for difficulty urinating and dysuria.   Musculoskeletal: Negative for arthralgias and back pain.  Skin: Negative.   Neurological: Negative.   Psychiatric/Behavioral: Negative.      Objective:  BP (!) 120/96   Pulse 65   Temp 97.8 F (36.6 C)   Resp 16   Ht 5\' 5"  (1.651 m)   Wt 202 lb (91.6 kg)   SpO2 98%   BMI 33.61 kg/m   BP/Weight 07/09/2020 06/23/2020 5/36/6440  Systolic BP 347 425 956  Diastolic BP 96 80 84  Wt. (Lbs) 202 202 200  BMI 33.61 33.61 33.28    Physical Exam Vitals reviewed.  Constitutional:      Appearance: Normal appearance. He is obese.  HENT:     Head: Normocephalic.     Right Ear: Tympanic membrane, ear canal and external ear normal.     Left Ear: Tympanic membrane, ear canal and external ear normal.     Mouth/Throat:     Mouth: Mucous membranes  are moist.     Pharynx: Oropharynx is clear.  Eyes:     Extraocular Movements: Extraocular movements intact.     Conjunctiva/sclera: Conjunctivae normal.     Pupils: Pupils are equal, round, and reactive to light.  Cardiovascular:     Rate and Rhythm: Normal rate and regular rhythm.     Pulses: Normal pulses.     Heart sounds: Normal heart sounds. No murmur heard. No gallop.   Pulmonary:     Effort: Pulmonary effort is normal. No respiratory distress.     Breath sounds: Normal breath sounds. No rales.  Abdominal:     General: Abdomen is flat. Bowel sounds are normal.     Palpations: Abdomen is soft.     Tenderness: There is no abdominal tenderness.  Musculoskeletal:        General: Normal range of motion.     Cervical back: Normal range of motion and neck supple.  Skin:    General: Skin is warm.     Capillary Refill: Capillary refill takes less than 2 seconds.  Neurological:     General: No focal deficit present.     Mental Status: He is alert and oriented to person, place, and time. Mental status is at baseline.  Psychiatric:        Mood and Affect: Mood normal.        Thought Content: Thought content normal.        Judgment:  Judgment normal.       Lab Results  Component Value Date   WBC 5.9 03/11/2020   HGB 12.7 (L) 03/11/2020   HCT 39.3 03/11/2020   PLT 266 03/11/2020   GLUCOSE 95 03/11/2020   CHOL 101 03/11/2020   TRIG 76 03/11/2020   HDL 46 03/11/2020   LDLCALC 39 03/11/2020   ALT 22 03/11/2020   AST 26 03/11/2020   NA 144 03/11/2020   K 4.5 03/11/2020   CL 104 03/11/2020   CREATININE 1.03 03/11/2020   BUN 15 03/11/2020   CO2 26 03/11/2020   TSH 3.110 06/23/2020   INR 1.0 12/18/2019   HGBA1C 6.6 (H) 11/25/2014      Assessment & Plan:   1. Other specified hypothyroidism - TSH Patient is known to have hypothyroidism  and is n treatment with levothyroxine 164mcg.  Patient was diagnosed 10 years ago.  Other treatment includes none.  Patient is compliant with medicines and last TSH 6 months ago.  Last TSH was normal.  2. Mixed hyperlipidemia - Lipid panel AN INDIVIDUAL CARE PLAN for hyperlipidemia/ cholesterol was established and reinforced today.  The patient's status was assessed using clinical findings on exam, lab and other diagnostic tests. The patient's disease status was assessed based on evidence-based guidelines and found to be fair controlled. MEDICATIONS were reviewed. SELF MANAGEMENT GOALS have been discussed and patient's success at attaining the goal of low cholesterol was assessed. RECOMMENDATION given include regular exercise 3 days a week and low cholesterol/low fat diet. CLINICAL SUMMARY including written plan to identify barriers unique to the patient due to social or economic  reasons was discussed.  3. Hypertensive heart disease with chronic diastolic congestive heart failure (HCC) - Comprehensive metabolic panel - CBC with Differential/Platelet An individual hypertension care plan was established and reinforced today.  The patient's status was assessed using clinical findings on exam and labs or diagnostic tests. The patient's success at meeting treatment goals on  disease specific evidence-based guidelines and found to be well controlled. SELF MANAGEMENT: The  patient and I together assessed ways to personally work towards obtaining the recommended goals. RECOMMENDATIONS: avoid decongestants found in common cold remedies, decrease consumption of alcohol, perform routine monitoring of BP with home BP cuff, exercise, reduction of dietary salt, take medicines as prescribed, try not to miss doses and quit smoking.  Regular exercise and maintaining a healthy weight is needed.  Stress reduction may help. A CLINICAL SUMMARY including written plan identify barriers to care unique to individual due to social or financial issues.  We attempt to mutually creat solutions for individual and family understanding.  4. Mixed conductive and sensorineural hearing loss of both ears Patient needs new hearing aids  5. Atherosclerotic peripheral vascular disease with intermittent claudication (HCC) No recent claudication, he has little exercise tolerance  6. TIA (transient ischemic attack) No further TIAs, neurologic intact.  7. Malignant neoplasm of larynx, unspecified ([place]) Patient has no recurrence of his laryngeal carcinoma  8. Restrictive lung disease Patient has restrictive lung disease and sees pulmonary  9. BMI 33.0-33.9,adult An individualize plan was formulated for obesity using patient history and physical exam to encourage weight loss.  An evidence based program was formulated.  Patient is to cut portion size with meals and to plan physical exercise 3 days a week at least 20 minutes.  Weight watchers and other programs are helpful.  Planned amount of weight loss 10 lbs.      Orders Placed This Encounter  Procedures  . Comprehensive metabolic panel  . Lipid panel  . CBC with Differential/Platelet  . TSH     Follow-up: Return in about 4 months (around 11/09/2020), or needs apointment to remove skin tag, for fasting.  An After Visit Summary was printed  and given to the patient.  Reinaldo Meeker, MD [facility] [phone]

## 2020-07-10 LAB — CBC WITH DIFFERENTIAL/PLATELET
Basophils Absolute: 0.1 10*3/uL (ref 0.0–0.2)
Basos: 1 %
EOS (ABSOLUTE): 0.4 10*3/uL (ref 0.0–0.4)
Eos: 6 %
Hematocrit: 43.1 % (ref 37.5–51.0)
Hemoglobin: 13 g/dL (ref 13.0–17.7)
Immature Grans (Abs): 0 10*3/uL (ref 0.0–0.1)
Immature Granulocytes: 0 %
Lymphocytes Absolute: 1 10*3/uL (ref 0.7–3.1)
Lymphs: 16 %
MCH: 27.6 pg (ref 26.6–33.0)
MCHC: 30.2 g/dL — ABNORMAL LOW (ref 31.5–35.7)
MCV: 92 fL (ref 79–97)
Monocytes Absolute: 0.8 10*3/uL (ref 0.1–0.9)
Monocytes: 13 %
Neutrophils Absolute: 4 10*3/uL (ref 1.4–7.0)
Neutrophils: 64 %
Platelets: 303 10*3/uL (ref 150–450)
RBC: 4.71 x10E6/uL (ref 4.14–5.80)
RDW: 14.2 % (ref 11.6–15.4)
WBC: 6.2 10*3/uL (ref 3.4–10.8)

## 2020-07-10 LAB — COMPREHENSIVE METABOLIC PANEL
ALT: 28 IU/L (ref 0–44)
AST: 27 IU/L (ref 0–40)
Albumin/Globulin Ratio: 1.8 (ref 1.2–2.2)
Albumin: 4.2 g/dL (ref 3.7–4.7)
Alkaline Phosphatase: 99 IU/L (ref 44–121)
BUN/Creatinine Ratio: 15 (ref 10–24)
BUN: 16 mg/dL (ref 8–27)
Bilirubin Total: 0.5 mg/dL (ref 0.0–1.2)
CO2: 23 mmol/L (ref 20–29)
Calcium: 9.3 mg/dL (ref 8.6–10.2)
Chloride: 99 mmol/L (ref 96–106)
Creatinine, Ser: 1.07 mg/dL (ref 0.76–1.27)
Globulin, Total: 2.4 g/dL (ref 1.5–4.5)
Glucose: 85 mg/dL (ref 65–99)
Potassium: 4.1 mmol/L (ref 3.5–5.2)
Sodium: 139 mmol/L (ref 134–144)
Total Protein: 6.6 g/dL (ref 6.0–8.5)
eGFR: 73 mL/min/{1.73_m2} (ref >59)

## 2020-07-10 LAB — LIPID PANEL
Chol/HDL Ratio: 2.4 ratio (ref 0.0–5.0)
Cholesterol, Total: 117 mg/dL (ref 100–199)
HDL: 48 mg/dL (ref >39)
LDL Chol Calc (NIH): 53 mg/dL (ref 0–99)
Triglycerides: 81 mg/dL (ref 0–149)
VLDL Cholesterol Cal: 16 mg/dL (ref 5–40)

## 2020-07-10 LAB — TSH: TSH: 5.61 u[IU]/mL — ABNORMAL HIGH (ref 0.450–4.500)

## 2020-07-10 LAB — CARDIOVASCULAR RISK ASSESSMENT

## 2020-07-10 NOTE — Progress Notes (Signed)
Kidney and liver tests normal, Cholesterol normal, cbc normal, TSH 5.6 - continue thyroid medicine lp

## 2020-07-14 ENCOUNTER — Telehealth: Payer: Self-pay

## 2020-07-14 NOTE — Telephone Encounter (Signed)
What meds is he taking ?  Is he drinking fluids well? Preferably 6- 8oz glasses of water a day. lp

## 2020-07-14 NOTE — Telephone Encounter (Signed)
Pt's cousin, Prentiss Bells, calling states pt's BP is low. States she took it right before calling and it was 73/53 with pulse 85, oxygen would not register. Had miriam check oxygen, did register at 91% with pulse 97. Cousin rechecked BP at 1152 BP was 122/107. Pt is complaining of vision problems, fatigue, SOB, and irritability. Cousin states he has been having these spells of weakness and SOB, when he has them he gets irritable. Pt denies one sided weakness and headache. Advised cousin to take pt to hospital. Cousin VU.   Royce Macadamia, [place] 07/14/20 11:57 AM

## 2020-07-15 NOTE — Telephone Encounter (Signed)
I called patient and he is doing better. He will drink more fluids. I recommended to call us back if he is not feeling better.

## 2020-07-16 ENCOUNTER — Ambulatory Visit: Payer: Medicare PPO | Admitting: Legal Medicine

## 2020-07-28 ENCOUNTER — Ambulatory Visit: Payer: Medicare PPO | Admitting: Legal Medicine

## 2020-07-28 ENCOUNTER — Encounter: Payer: Self-pay | Admitting: Legal Medicine

## 2020-07-28 ENCOUNTER — Other Ambulatory Visit: Payer: Self-pay

## 2020-07-28 VITALS — BP 124/80 | HR 67 | Temp 97.1°F | Resp 16 | Ht 65.0 in | Wt 202.0 lb

## 2020-07-28 DIAGNOSIS — L918 Other hypertrophic disorders of the skin: Secondary | ICD-10-CM | POA: Diagnosis not present

## 2020-07-28 NOTE — Progress Notes (Signed)
Established Patient Office Visit  Subjective:  Patient ID: James Richards, male    DOB: 1945/03/27  Age: 75 y.o. MRN: [id number]  CC:  Chief Complaint  Patient presents with   Skin Tag    Skin tag removal     HPI James Richards presents for skin tag OD medial canthus.  It is chronic and obscures vision in right eye.  No changes in skin tag.  Past Medical History:  Diagnosis Date   Atherosclerosis of native arteries of the extremities with intermittent claudication 10/09/2013   Atherosclerotic peripheral vascular disease with intermittent claudication ([place]) 10/03/2012   BMI 33.0-33.9,adult 07/07/2019   Carotid stenosis 40/98/1191   Diastolic dysfunction 47/82/9562   Essential hypertension    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   History of recurrent TIAs 12/11/2014   Impacted cerumen 11/26/2019   Malignant neoplasm of base of tongue ([place]) 01/21/2015   Malignant neoplasm of larynx, unspecified ([facility])    Migraine 04/02/2014   Mixed hyperlipidemia    Other fatigue    Other specified hypothyroidism    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    Restless legs syndrome    Restrictive lung disease 11/26/2019   TIA (transient ischemic attack) 11/24/2014    Past Surgical History:  Procedure Laterality Date   LYMPHADENECTOMY     PERIPHERAL VASCULAR CATHETERIZATION N/A 11/24/2014   Procedure: Aortic Arch Angiography;  Surgeon: Angelia Mould, MD;  Location: [facility];  Service: Cardiovascular;  Laterality: N/A;   PERIPHERAL VASCULAR CATHETERIZATION N/A 11/24/2014   Procedure: Cerebral Angiography;  Surgeon: Angelia Mould, MD;  Location: [facility];  Service: Cardiovascular;  Laterality: N/A;   THROAT SURGERY     TONSILECTOMY/ADENOIDECTOMY WITH MYRINGOTOMY      Family History  Problem Relation Age of Onset   Other Father        amputation   Deep vein thrombosis Father    Heart disease Father        Atrial Fib.    Cancer Sister    Hypertension Brother     Social History   Socioeconomic History   Marital status: Divorced    Spouse name: Not on file   Number of children: Not on file   Years of education: Not on file   Highest education level: Not on file  Occupational History   Not on file  Tobacco Use   Smoking status: Former    Pack years: 0.00    Types: Cigarettes    Quit date: 02/15/2007    Years since quitting: 13.4   Smokeless tobacco: Former    Types: Nurse, children's Use: Never used  Substance and Sexual Activity   Alcohol use: No    Alcohol/week: 0.0 standard drinks   Drug use: No   Sexual activity: Not Currently    Partners: Female  Other Topics Concern   Not on file  Social History Narrative   Not on file   Social Determinants of Health   Financial Resource Strain: Low Risk    Difficulty of Paying Living Expenses: Not hard at all  Food Insecurity: No Food Insecurity   Worried About Charity fundraiser in the Last Year: Never true   Arboriculturist in the Last Year: Never true  Transportation Needs: No Transportation Needs   Lack of Transportation (Medical): No   Lack of Transportation (Non-Medical): No  Physical Activity: Inactive   Days of Exercise  per Week: 0 days   Minutes of Exercise per Session: 0 min  Stress: No Stress Concern Present   Feeling of Stress : Not at all  Social Connections: Socially Integrated   Frequency of Communication with Friends and Family: More than three times a week   Frequency of Social Gatherings with Friends and Family: Twice a week   Attends Religious Services: More than 4 times per year   Active Member of [employer] or Organizations: Yes   Attends Music therapist: More than 4 times per year   Marital Status: Married  Human resources officer Violence: Not At Risk   Fear of Current or Ex-Partner: No   Emotionally Abused: No   Physically Abused: No   Sexually Abused: No    Outpatient Medications Prior to Visit   Medication Sig Dispense Refill   aspirin EC 81 MG tablet Take 81 mg by mouth daily. Swallow whole.     atorvastatin (LIPITOR) 80 MG tablet TAKE 1 TABLET EVERY DAY 90 tablet 2   clopidogrel (PLAVIX) 75 MG tablet TAKE 1 TABLET EVERY DAY 90 tablet 2   levothyroxine (SYNTHROID) 125 MCG tablet Take 1 tablet (125 mcg total) by mouth daily. 90 tablet 2   minocycline (MINOCIN) 50 MG capsule TAKE 1 CAPSULE EVERY 12 HOURS 180 capsule 2   Omega-3 Fatty Acids (FISH OIL) 1200 MG CPDR Take 1 capsule by mouth daily.     omeprazole (PRILOSEC) 40 MG capsule TAKE 1 CAPSULE EVERY DAY 90 capsule 2   rOPINIRole (REQUIP) 0.5 MG tablet TAKE 1 TABLET AT BEDTIME 90 tablet 2   furosemide (LASIX) 40 MG tablet Take 1 tablet (40 mg total) by mouth daily. 90 tablet 3   losartan (COZAAR) 50 MG tablet Take 1 tablet (50 mg total) by mouth daily. 90 tablet 3   No facility-administered medications prior to visit.    No Known Allergies  ROS Review of Systems  Constitutional:  Negative for chills, fatigue and fever.  HENT:  Negative for congestion, ear pain and sore throat.   Respiratory:  Negative for cough and shortness of breath.   Cardiovascular:  Negative for chest pain.  Gastrointestinal:  Negative for abdominal pain, constipation, diarrhea, nausea and vomiting.  Endocrine: Negative for polydipsia, polyphagia and polyuria.  Genitourinary:  Negative for dysuria and frequency.  Musculoskeletal:  Negative for arthralgias and myalgias.  Neurological:  Negative for dizziness and headaches.  Psychiatric/Behavioral:  Negative for dysphoric mood.        No dysphoria     Objective:    Physical Exam Vitals reviewed.  Constitutional:      Appearance: Normal appearance.  Eyes:     Extraocular Movements: Extraocular movements intact.     Conjunctiva/sclera: Conjunctivae normal.     Pupils: Pupils are equal, round, and reactive to light.     Comments: Skin tag medial canthus right eye, sebaceous cyst on back.   Pulmonary:     Effort: Pulmonary effort is normal.     Breath sounds: Normal breath sounds.  Skin:    Comments: Skin tag medial canthus OD  Neurological:     Mental Status: He is alert.    BP 124/80   Pulse 67   Temp (!) 97.1 F (36.2 C)   Resp 16   Ht '5\' 5"'  (1.651 m)   Wt 202 lb (91.6 kg)   SpO2 97%   BMI 33.61 kg/m  Wt Readings from Last 3 Encounters:  07/28/20 202 lb (91.6 kg)  07/09/20 202  lb (91.6 kg)  06/23/20 202 lb (91.6 kg)     Health Maintenance Due  Topic Date Due   Hepatitis C Screening  Never done   TETANUS/TDAP  Never done   Zoster Vaccines- Shingrix (1 of 2) Never done    There are no preventive care reminders to display for this patient.  Lab Results  Component Value Date   TSH 5.610 (H) 07/09/2020   Lab Results  Component Value Date   WBC 6.2 07/09/2020   HGB 13.0 07/09/2020   HCT 43.1 07/09/2020   MCV 92 07/09/2020   PLT 303 07/09/2020   Lab Results  Component Value Date   NA 139 07/09/2020   K 4.1 07/09/2020   CO2 23 07/09/2020   GLUCOSE 85 07/09/2020   BUN 16 07/09/2020   CREATININE 1.07 07/09/2020   BILITOT 0.5 07/09/2020   ALKPHOS 99 07/09/2020   AST 27 07/09/2020   ALT 28 07/09/2020   PROT 6.6 07/09/2020   ALBUMIN 4.2 07/09/2020   CALCIUM 9.3 07/09/2020   ANIONGAP 10 11/25/2014   EGFR 73 07/09/2020   Lab Results  Component Value Date   CHOL 117 07/09/2020   Lab Results  Component Value Date   HDL 48 07/09/2020   Lab Results  Component Value Date   LDLCALC 53 07/09/2020   Lab Results  Component Value Date   TRIG 81 07/09/2020   Lab Results  Component Value Date   CHOLHDL 2.4 07/09/2020   Lab Results  Component Value Date   HGBA1C 6.6 (H) 11/25/2014      Assessment & Plan:   Problem List Items Addressed This Visit      Skin tag    -  Primary  The skin tag on medial cantus was anesthetized with 1% xylocaine, after informed consent.  The skin tag was cut off and pressure used for hemostasis.  No  complication, no blood loss.        Follow-up: Return in about 1 week (around 08/04/2020) for follow up on skin tag removal.    Reinaldo Meeker, MD

## 2020-07-28 NOTE — Patient Instructions (Signed)
Wound Care, Adult Taking care of your wound properly can help to prevent pain, infection, and scarring. It can also help your wound heal more quickly. Follow instructionsfrom your health care provider about how to care for your wound. Supplies needed: Soap and water. Wound cleanser. Gauze. If needed, a clean bandage (dressing) or other type of wound dressing material to cover or place in the wound. Follow your health care provider's instructions about what dressing supplies to use. Cream or ointment to apply to the wound, if told by your health care provider. How to care for your wound Cleaning the wound Ask your health care provider how to clean the wound. This may include: Using mild soap and water or a wound cleanser. Using a clean gauze to pat the wound dry after cleaning it. Do not rub or scrub the wound. Dressing care Wash your hands with soap and water for at least 20 seconds before and after you change the dressing. If soap and water are not available, use hand sanitizer. Change your dressing as told by your health care provider. This may include: Cleaning or rinsing out (irrigating) the wound. Placing a dressing over the wound or in the wound (packing). Covering the wound with an outer dressing. Leave any stitches (sutures), skin glue, or adhesive strips in place. These skin closures may need to stay in place for 2 weeks or longer. If adhesive strip edges start to loosen and curl up, you may trim the loose edges. Do not remove adhesive strips completely unless your health care provider tells you to do that. Ask your health care provider when you can leave the wound uncovered. Checking for infection Check your wound area every day for signs of infection. Check for: More redness, swelling, or pain. Fluid or blood. Warmth. Pus or a bad smell.  Follow these instructions at home Medicines If you were prescribed an antibiotic medicine, cream, or ointment, take or apply it as told by  your health care provider. Do not stop using the antibiotic even if your condition improves. If you were prescribed pain medicine, take it 30 minutes before you do any wound care or as told by your health care provider. Take over-the-counter and prescription medicines only as told by your health care provider. Eating and drinking Eat a diet that includes protein, vitamin A, vitamin C, and other nutrient-rich foods to help the wound heal. Foods rich in protein include meat, fish, eggs, dairy, beans, and nuts. Foods rich in vitamin A include carrots and dark green, leafy vegetables. Foods rich in vitamin C include citrus fruits, tomatoes, broccoli, and peppers. Drink enough fluid to keep your urine pale yellow. General instructions Do not take baths, swim, use a hot tub, or do anything that would put the wound underwater until your health care provider approves. Ask your health care provider if you may take showers. You may only be allowed to take sponge baths. Do not scratch or pick at the wound. Keep it covered as told by your health care provider. Return to your normal activities as told by your health care provider. Ask your health care provider what activities are safe for you. Protect your wound from the sun when you are outside for the first 6 months, or for as long as told by your health care provider. Cover up the scar area or apply sunscreen that has an SPF of at least 30. Do not use any products that contain nicotine or tobacco, such as cigarettes, e-cigarettes, and chewing tobacco.   These may delay wound healing. If you need help quitting, ask your health care provider. Keep all follow-up visits as told by your health care provider. This is important. Contact a health care provider if: You received a tetanus shot and you have swelling, severe pain, redness, or bleeding at the injection site. Your pain is not controlled with medicine. You have any of these signs of infection: More  redness, swelling, or pain around the wound. Fluid or blood coming from the wound. Warmth coming from the wound. Pus or a bad smell coming from the wound. A fever or chills. You are nauseous or you vomit. You are dizzy. Get help right away if: You have a red streak of skin near the area around your wound. Your wound has been closed with staples, sutures, skin glue, or adhesive strips and it begins to open up and separate. Your wound is bleeding, and the bleeding does not stop with gentle pressure. You have a rash. You faint. You have trouble breathing. These symptoms may represent a serious problem that is an emergency. Do not wait to see if the symptoms will go away. Get medical help right away. Call your local emergency services (911 in the [place] Do not drive yourself to the hospital. Summary Always wash your hands with soap and water for at least 20 seconds before and after changing your dressing. Change your dressing as told by your health care provider. To help with healing, eat foods that are rich in protein, vitamin A, vitamin C, and other nutrients. Check your wound every day for signs of infection. Contact your health care provider if you suspect that your wound is infected. This information is not intended to replace advice given to you by your health care provider. Make sure you discuss any questions you have with your healthcare provider. Document Revised: 11/16/2018 Document Reviewed: 11/16/2018 Elsevier Patient Education  2022 Elsevier Inc[employer]  

## 2020-08-04 ENCOUNTER — Encounter: Payer: Self-pay | Admitting: Legal Medicine

## 2020-08-04 ENCOUNTER — Other Ambulatory Visit: Payer: Self-pay

## 2020-08-04 ENCOUNTER — Ambulatory Visit: Payer: Medicare PPO | Admitting: Legal Medicine

## 2020-08-04 DIAGNOSIS — L02212 Cutaneous abscess of back [any part, except buttock]: Secondary | ICD-10-CM | POA: Insufficient documentation

## 2020-08-04 DIAGNOSIS — S21209A Unspecified open wound of unspecified back wall of thorax without penetration into thoracic cavity, initial encounter: Secondary | ICD-10-CM | POA: Insufficient documentation

## 2020-08-04 MED ORDER — SULFAMETHOXAZOLE-TRIMETHOPRIM 800-160 MG PO TABS: 1.0000 | ORAL_TABLET | Freq: Two times a day (BID) | ORAL | 0 refills | Status: DC

## 2020-08-04 NOTE — Progress Notes (Signed)
Established Patient Office Visit  Subjective:  Patient ID: James Richards, male    DOB: 11/30/1945  Age: 75 y.o. MRN: [id number]  CC:  Chief Complaint  Patient presents with   Cyst    Mid back    HPI James Richards presents for cyst on back that is painful and changing.  He needs removed. It is red and inflamed with secondary infection.  Past Medical History:  Diagnosis Date   Atherosclerosis of native arteries of the extremities with intermittent claudication 10/09/2013   Atherosclerotic peripheral vascular disease with intermittent claudication ([place]) 10/03/2012   BMI 33.0-33.9,adult 07/07/2019   Carotid stenosis 62/13/0865   Diastolic dysfunction 78/46/9629   Essential hypertension    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   History of recurrent TIAs 12/11/2014   Impacted cerumen 11/26/2019   Malignant neoplasm of base of tongue ([place]) 01/21/2015   Malignant neoplasm of larynx, unspecified ([facility])    Migraine 04/02/2014   Mixed hyperlipidemia    Other fatigue    Other specified hypothyroidism    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    Restless legs syndrome    Restrictive lung disease 11/26/2019   TIA (transient ischemic attack) 11/24/2014    Past Surgical History:  Procedure Laterality Date   LYMPHADENECTOMY     PERIPHERAL VASCULAR CATHETERIZATION N/A 11/24/2014   Procedure: Aortic Arch Angiography;  Surgeon: Angelia Mould, MD;  Location: [facility];  Service: Cardiovascular;  Laterality: N/A;   PERIPHERAL VASCULAR CATHETERIZATION N/A 11/24/2014   Procedure: Cerebral Angiography;  Surgeon: Angelia Mould, MD;  Location: [facility];  Service: Cardiovascular;  Laterality: N/A;   THROAT SURGERY     TONSILECTOMY/ADENOIDECTOMY WITH MYRINGOTOMY      Family History  Problem Relation Age of Onset   Other Father        amputation   Deep vein thrombosis Father    Heart disease Father        Atrial Fib.   Cancer  Sister    Hypertension Brother     Social History   Socioeconomic History   Marital status: Divorced    Spouse name: Not on file   Number of children: Not on file   Years of education: Not on file   Highest education level: Not on file  Occupational History   Not on file  Tobacco Use   Smoking status: Former    Pack years: 0.00    Types: Cigarettes    Quit date: 02/15/2007    Years since quitting: 13.4   Smokeless tobacco: Former    Types: Nurse, children's Use: Never used  Substance and Sexual Activity   Alcohol use: No    Alcohol/week: 0.0 standard drinks   Drug use: No   Sexual activity: Not Currently    Partners: Female  Other Topics Concern   Not on file  Social History Narrative   Not on file   Social Determinants of Health   Financial Resource Strain: Low Risk    Difficulty of Paying Living Expenses: Not hard at all  Food Insecurity: No Food Insecurity   Worried About Charity fundraiser in the Last Year: Never true   Arboriculturist in the Last Year: Never true  Transportation Needs: No Transportation Needs   Lack of Transportation (Medical): No   Lack of Transportation (Non-Medical): No  Physical Activity: Inactive   Days of Exercise per Week: 0 days  Minutes of Exercise per Session: 0 min  Stress: No Stress Concern Present   Feeling of Stress : Not at all  Social Connections: Socially Integrated   Frequency of Communication with Friends and Family: More than three times a week   Frequency of Social Gatherings with Friends and Family: Twice a week   Attends Religious Services: More than 4 times per year   Active Member of [employer] or Organizations: Yes   Attends Music therapist: More than 4 times per year   Marital Status: Married  Human resources officer Violence: Not At Risk   Fear of Current or Ex-Partner: No   Emotionally Abused: No   Physically Abused: No   Sexually Abused: No    Outpatient Medications Prior to Visit   Medication Sig Dispense Refill   aspirin EC 81 MG tablet Take 81 mg by mouth daily. Swallow whole.     atorvastatin (LIPITOR) 80 MG tablet TAKE 1 TABLET EVERY DAY 90 tablet 2   clopidogrel (PLAVIX) 75 MG tablet TAKE 1 TABLET EVERY DAY 90 tablet 2   furosemide (LASIX) 40 MG tablet Take 1 tablet (40 mg total) by mouth daily. 90 tablet 3   levothyroxine (SYNTHROID) 125 MCG tablet Take 1 tablet (125 mcg total) by mouth daily. 90 tablet 2   losartan (COZAAR) 50 MG tablet Take 1 tablet (50 mg total) by mouth daily. 90 tablet 3   minocycline (MINOCIN) 50 MG capsule TAKE 1 CAPSULE EVERY 12 HOURS 180 capsule 2   Omega-3 Fatty Acids (FISH OIL) 1200 MG CPDR Take 1 capsule by mouth daily.     omeprazole (PRILOSEC) 40 MG capsule TAKE 1 CAPSULE EVERY DAY 90 capsule 2   rOPINIRole (REQUIP) 0.5 MG tablet TAKE 1 TABLET AT BEDTIME 90 tablet 2   No facility-administered medications prior to visit.    No Known Allergies  ROS Review of Systems  Constitutional:  Negative for chills, fatigue and fever.  HENT:  Negative for congestion, ear pain and sore throat.   Respiratory:  Negative for cough and shortness of breath.   Cardiovascular:  Negative for chest pain.  Gastrointestinal:  Negative for abdominal pain, constipation, diarrhea, nausea and vomiting.  Endocrine: Negative for polydipsia, polyphagia and polyuria.  Genitourinary:  Negative for dysuria and frequency.  Musculoskeletal:  Negative for arthralgias and myalgias.  Neurological:  Negative for dizziness and headaches.  Psychiatric/Behavioral:  Negative for dysphoric mood.        No dysphoria     Objective:    Physical Exam Vitals reviewed.  Constitutional:      Appearance: Normal appearance.  HENT:     Head: Normocephalic.     Right Ear: Tympanic membrane normal.     Left Ear: Tympanic membrane normal.  Cardiovascular:     Rate and Rhythm: Normal rate and regular rhythm.     Pulses: Normal pulses.     Heart sounds: Normal heart  sounds. No murmur heard.   No gallop.  Pulmonary:     Effort: Pulmonary effort is normal.     Breath sounds: Normal breath sounds.  Skin:    Comments: 3cm cyst on back, red and tender.  It is enlarging  Neurological:     Mental Status: He is alert.    BP 130/80 (BP Location: Left Arm, Patient Position: Sitting)   Pulse 76   Temp (!) 97.4 F (36.3 C)   Resp 18   Ht '5\' 5"'  (1.651 m)   Wt 200 lb 9.6 oz (91  kg)   BMI 33.38 kg/m  Wt Readings from Last 3 Encounters:  08/04/20 200 lb 9.6 oz (91 kg)  07/28/20 202 lb (91.6 kg)  07/09/20 202 lb (91.6 kg)     Health Maintenance Due  Topic Date Due   Hepatitis C Screening  Never done   TETANUS/TDAP  Never done   Zoster Vaccines- Shingrix (1 of 2) Never done    There are no preventive care reminders to display for this patient.  Lab Results  Component Value Date   TSH 5.610 (H) 07/09/2020   Lab Results  Component Value Date   WBC 6.2 07/09/2020   HGB 13.0 07/09/2020   HCT 43.1 07/09/2020   MCV 92 07/09/2020   PLT 303 07/09/2020   Lab Results  Component Value Date   NA 139 07/09/2020   K 4.1 07/09/2020   CO2 23 07/09/2020   GLUCOSE 85 07/09/2020   BUN 16 07/09/2020   CREATININE 1.07 07/09/2020   BILITOT 0.5 07/09/2020   ALKPHOS 99 07/09/2020   AST 27 07/09/2020   ALT 28 07/09/2020   PROT 6.6 07/09/2020   ALBUMIN 4.2 07/09/2020   CALCIUM 9.3 07/09/2020   ANIONGAP 10 11/25/2014   EGFR 73 07/09/2020   Lab Results  Component Value Date   CHOL 117 07/09/2020   Lab Results  Component Value Date   HDL 48 07/09/2020   Lab Results  Component Value Date   LDLCALC 53 07/09/2020   Lab Results  Component Value Date   TRIG 81 07/09/2020   Lab Results  Component Value Date   CHOLHDL 2.4 07/09/2020   Lab Results  Component Value Date   HGBA1C 6.6 (H) 11/25/2014      Assessment & Plan:   Diagnoses and all orders for this visit: Abscess of back -     sulfamethoxazole-trimethoprim (BACTRIM DS) 800-160 MG  tablet; Take 1 tablet by mouth 2 (two) times daily. -     Anaerobic and Aerobic Culture  Patient has infected epidermal inclusion cyst.  This was I & D with purulence, cultured. Start on bactrim  Procedure: informed consent and area prepped and draped.  1% xylocaine installed and abscess was I & D with purulent material, cultured.  The wound was cleaned and packed. And dressed.   Follow-up: Return in about 2 days (around 08/06/2020) for abscess.    Reinaldo Meeker, MD

## 2020-08-04 NOTE — Patient Instructions (Signed)
Wound Care, Adult Taking care of your wound properly can help to prevent pain, infection, and scarring. It can also help your wound heal more quickly. Follow instructionsfrom your health care provider about how to care for your wound. Supplies needed: Soap and water. Wound cleanser. Gauze. If needed, a clean bandage (dressing) or other type of wound dressing material to cover or place in the wound. Follow your health care provider's instructions about what dressing supplies to use. Cream or ointment to apply to the wound, if told by your health care provider. How to care for your wound Cleaning the wound Ask your health care provider how to clean the wound. This may include: Using mild soap and water or a wound cleanser. Using a clean gauze to pat the wound dry after cleaning it. Do not rub or scrub the wound. Dressing care Wash your hands with soap and water for at least 20 seconds before and after you change the dressing. If soap and water are not available, use hand sanitizer. Change your dressing as told by your health care provider. This may include: Cleaning or rinsing out (irrigating) the wound. Placing a dressing over the wound or in the wound (packing). Covering the wound with an outer dressing. Leave any stitches (sutures), skin glue, or adhesive strips in place. These skin closures may need to stay in place for 2 weeks or longer. If adhesive strip edges start to loosen and curl up, you may trim the loose edges. Do not remove adhesive strips completely unless your health care provider tells you to do that. Ask your health care provider when you can leave the wound uncovered. Checking for infection Check your wound area every day for signs of infection. Check for: More redness, swelling, or pain. Fluid or blood. Warmth. Pus or a bad smell.  Follow these instructions at home Medicines If you were prescribed an antibiotic medicine, cream, or ointment, take or apply it as told by  your health care provider. Do not stop using the antibiotic even if your condition improves. If you were prescribed pain medicine, take it 30 minutes before you do any wound care or as told by your health care provider. Take over-the-counter and prescription medicines only as told by your health care provider. Eating and drinking Eat a diet that includes protein, vitamin A, vitamin C, and other nutrient-rich foods to help the wound heal. Foods rich in protein include meat, fish, eggs, dairy, beans, and nuts. Foods rich in vitamin A include carrots and dark green, leafy vegetables. Foods rich in vitamin C include citrus fruits, tomatoes, broccoli, and peppers. Drink enough fluid to keep your urine pale yellow. General instructions Do not take baths, swim, use a hot tub, or do anything that would put the wound underwater until your health care provider approves. Ask your health care provider if you may take showers. You may only be allowed to take sponge baths. Do not scratch or pick at the wound. Keep it covered as told by your health care provider. Return to your normal activities as told by your health care provider. Ask your health care provider what activities are safe for you. Protect your wound from the sun when you are outside for the first 6 months, or for as long as told by your health care provider. Cover up the scar area or apply sunscreen that has an SPF of at least 30. Do not use any products that contain nicotine or tobacco, such as cigarettes, e-cigarettes, and chewing tobacco.   These may delay wound healing. If you need help quitting, ask your health care provider. Keep all follow-up visits as told by your health care provider. This is important. Contact a health care provider if: You received a tetanus shot and you have swelling, severe pain, redness, or bleeding at the injection site. Your pain is not controlled with medicine. You have any of these signs of infection: More  redness, swelling, or pain around the wound. Fluid or blood coming from the wound. Warmth coming from the wound. Pus or a bad smell coming from the wound. A fever or chills. You are nauseous or you vomit. You are dizzy. Get help right away if: You have a red streak of skin near the area around your wound. Your wound has been closed with staples, sutures, skin glue, or adhesive strips and it begins to open up and separate. Your wound is bleeding, and the bleeding does not stop with gentle pressure. You have a rash. You faint. You have trouble breathing. These symptoms may represent a serious problem that is an emergency. Do not wait to see if the symptoms will go away. Get medical help right away. Call your local emergency services (911 in the [place] Do not drive yourself to the hospital. Summary Always wash your hands with soap and water for at least 20 seconds before and after changing your dressing. Change your dressing as told by your health care provider. To help with healing, eat foods that are rich in protein, vitamin A, vitamin C, and other nutrients. Check your wound every day for signs of infection. Contact your health care provider if you suspect that your wound is infected. This information is not intended to replace advice given to you by your health care provider. Make sure you discuss any questions you have with your healthcare provider. Document Revised: 11/16/2018 Document Reviewed: 11/16/2018 Elsevier Patient Education  2022 Elsevier Inc[employer]  

## 2020-08-06 ENCOUNTER — Encounter: Payer: Self-pay | Admitting: Legal Medicine

## 2020-08-06 ENCOUNTER — Ambulatory Visit (INDEPENDENT_AMBULATORY_CARE_PROVIDER_SITE_OTHER): Payer: Medicare PPO | Admitting: Legal Medicine

## 2020-08-06 VITALS — BP 120/80 | HR 75 | Temp 97.5°F | Ht 65.0 in | Wt 200.0 lb

## 2020-08-06 DIAGNOSIS — L02212 Cutaneous abscess of back [any part, except buttock]: Secondary | ICD-10-CM

## 2020-08-06 NOTE — Progress Notes (Signed)
Acute Office Visit  Subjective:    Patient ID: James Richards, male    DOB: [birth year], 75 y.o.   MRN: [id number]  Chief Complaint  Patient presents with   Abcess of Back    HPI Patient is in today for follow up abscess on back. Some pain but  less drainage.  Past Medical History:  Diagnosis Date   Atherosclerosis of native arteries of the extremities with intermittent claudication 10/09/2013   Atherosclerotic peripheral vascular disease with intermittent claudication ([place]) 10/03/2012   BMI 33.0-33.9,adult 07/07/2019   Carotid stenosis 12/11/2534   Diastolic dysfunction 64/40/3474   Essential hypertension    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   History of recurrent TIAs 12/11/2014   Impacted cerumen 11/26/2019   Malignant neoplasm of base of tongue ([place]) 01/21/2015   Malignant neoplasm of larynx, unspecified (HCC)    Migraine 04/02/2014   Mixed hyperlipidemia    Other fatigue    Other specified hypothyroidism    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    Restless legs syndrome    Restrictive lung disease 11/26/2019   TIA (transient ischemic attack) 11/24/2014    Past Surgical History:  Procedure Laterality Date   LYMPHADENECTOMY     PERIPHERAL VASCULAR CATHETERIZATION N/A 11/24/2014   Procedure: Aortic Arch Angiography;  Surgeon: Angelia Mould, MD;  Location: [facility];  Service: Cardiovascular;  Laterality: N/A;   PERIPHERAL VASCULAR CATHETERIZATION N/A 11/24/2014   Procedure: Cerebral Angiography;  Surgeon: Angelia Mould, MD;  Location: [facility];  Service: Cardiovascular;  Laterality: N/A;   THROAT SURGERY     TONSILECTOMY/ADENOIDECTOMY WITH MYRINGOTOMY      Family History  Problem Relation Age of Onset   Other Father        amputation   Deep vein thrombosis Father    Heart disease Father        Atrial Fib.   Cancer Sister    Hypertension Brother     Social History   Socioeconomic History    Marital status: Divorced    Spouse name: Not on file   Number of children: Not on file   Years of education: Not on file   Highest education level: Not on file  Occupational History   Not on file  Tobacco Use   Smoking status: Former    Pack years: 0.00    Types: Cigarettes    Quit date: 02/15/2007    Years since quitting: 13.4   Smokeless tobacco: Former    Types: Nurse, children's Use: Never used  Substance and Sexual Activity   Alcohol use: No    Alcohol/week: 0.0 standard drinks   Drug use: No   Sexual activity: Not Currently    Partners: Female  Other Topics Concern   Not on file  Social History Narrative   Not on file   Social Determinants of Health   Financial Resource Strain: Low Risk    Difficulty of Paying Living Expenses: Not hard at all  Food Insecurity: No Food Insecurity   Worried About Charity fundraiser in the Last Year: Never true   Arboriculturist in the Last Year: Never true  Transportation Needs: No Transportation Needs   Lack of Transportation (Medical): No   Lack of Transportation (Non-Medical): No  Physical Activity: Inactive   Days of Exercise per Week: 0 days   Minutes of Exercise per Session: 0 min  Stress: No Stress  Concern Present   Feeling of Stress : Not at all  Social Connections: Socially Integrated   Frequency of Communication with Friends and Family: More than three times a week   Frequency of Social Gatherings with Friends and Family: Twice a week   Attends Religious Services: More than 4 times per year   Active Member of [employer] or Organizations: Yes   Attends Music therapist: More than 4 times per year   Marital Status: Married  Human resources officer Violence: Not At Risk   Fear of Current or Ex-Partner: No   Emotionally Abused: No   Physically Abused: No   Sexually Abused: No    Outpatient Medications Prior to Visit  Medication Sig Dispense Refill   aspirin EC 81 MG tablet Take 81 mg by mouth daily.  Swallow whole.     atorvastatin (LIPITOR) 80 MG tablet TAKE 1 TABLET EVERY DAY 90 tablet 2   clopidogrel (PLAVIX) 75 MG tablet TAKE 1 TABLET EVERY DAY 90 tablet 2   furosemide (LASIX) 40 MG tablet Take 1 tablet (40 mg total) by mouth daily. 90 tablet 3   levothyroxine (SYNTHROID) 125 MCG tablet Take 1 tablet (125 mcg total) by mouth daily. 90 tablet 2   losartan (COZAAR) 50 MG tablet Take 1 tablet (50 mg total) by mouth daily. 90 tablet 3   minocycline (MINOCIN) 50 MG capsule TAKE 1 CAPSULE EVERY 12 HOURS 180 capsule 2   Omega-3 Fatty Acids (FISH OIL) 1200 MG CPDR Take 1 capsule by mouth daily.     omeprazole (PRILOSEC) 40 MG capsule TAKE 1 CAPSULE EVERY DAY 90 capsule 2   rOPINIRole (REQUIP) 0.5 MG tablet TAKE 1 TABLET AT BEDTIME 90 tablet 2   sulfamethoxazole-trimethoprim (BACTRIM DS) 800-160 MG tablet Take 1 tablet by mouth 2 (two) times daily. 20 tablet 0   No facility-administered medications prior to visit.    No Known Allergies  Review of Systems  Constitutional:  Negative for chills, fatigue and fever.  HENT:  Negative for congestion, ear pain and sore throat.   Respiratory:  Negative for cough and shortness of breath.   Cardiovascular:  Negative for chest pain.  Gastrointestinal:  Negative for abdominal pain, constipation, diarrhea, nausea and vomiting.  Endocrine: Negative for polydipsia, polyphagia and polyuria.  Genitourinary:  Negative for dysuria and frequency.  Musculoskeletal:  Negative for arthralgias and myalgias.  Neurological:  Negative for dizziness and headaches.  Psychiatric/Behavioral:  Negative for dysphoric mood.        No dysphoria      Objective:    Physical Exam Vitals reviewed.  Constitutional:      Appearance: Normal appearance.  HENT:     Right Ear: Tympanic membrane normal.     Left Ear: Tympanic membrane normal.  Cardiovascular:     Rate and Rhythm: Normal rate and regular rhythm.     Pulses: Normal pulses.     Heart sounds: Normal heart  sounds.  Skin:    General: Skin is warm and dry.     Comments:  Abscess is clean and very little drainage.  Packing removed.  Neurological:     Mental Status: He is alert.    BP 120/80   Pulse 75   Temp (!) 97.5 F (36.4 C)   Ht _0  (1.651 m)   Wt 200 lb (90.7 kg)   SpO2 99%   BMI 33.28 kg/m  Wt Readings from Last 3 Encounters:  08/06/20 200 lb (90.7 kg)  08/04/20 200 lb  9.6 oz (91 kg)  07/28/20 202 lb (91.6 kg)    Health Maintenance Due  Topic Date Due   Hepatitis C Screening  Never done   TETANUS/TDAP  Never done   Zoster Vaccines- Shingrix (1 of 2) Never done    There are no preventive care reminders to display for this patient.   Lab Results  Component Value Date   TSH 5.610 (H) 07/09/2020   Lab Results  Component Value Date   WBC 6.2 07/09/2020   HGB 13.0 07/09/2020   HCT 43.1 07/09/2020   MCV 92 07/09/2020   PLT 303 07/09/2020   Lab Results  Component Value Date   NA 139 07/09/2020   K 4.1 07/09/2020   CO2 23 07/09/2020   GLUCOSE 85 07/09/2020   BUN 16 07/09/2020   CREATININE 1.07 07/09/2020   BILITOT 0.5 07/09/2020   ALKPHOS 99 07/09/2020   AST 27 07/09/2020   ALT 28 07/09/2020   PROT 6.6 07/09/2020   ALBUMIN 4.2 07/09/2020   CALCIUM 9.3 07/09/2020   ANIONGAP 10 11/25/2014   EGFR 73 07/09/2020   Lab Results  Component Value Date   CHOL 117 07/09/2020   Lab Results  Component Value Date   HDL 48 07/09/2020   Lab Results  Component Value Date   LDLCALC 53 07/09/2020   Lab Results  Component Value Date   TRIG 81 07/09/2020   Lab Results  Component Value Date   CHOLHDL 2.4 07/09/2020   Lab Results  Component Value Date   HGBA1C 6.6 (H) 11/25/2014       Assessment & Plan:  Diagnoses and all orders for this visit: Abscess of back  Draining well after &I & d, packing removed and he was redressed      I spent 15 minutes dedicated to the care of this patient on the date of this encounter to include face-to-face time  with the patient, as well as  Follow-up: Return in about 1 week (around 08/13/2020) for abscess.  An After Visit Summary was printed and given to the patient.  Reinaldo Meeker, MD [facility] [phone]

## 2020-08-12 ENCOUNTER — Ambulatory Visit (INDEPENDENT_AMBULATORY_CARE_PROVIDER_SITE_OTHER): Payer: Medicare PPO | Admitting: Legal Medicine

## 2020-08-12 ENCOUNTER — Encounter: Payer: Self-pay | Admitting: Legal Medicine

## 2020-08-12 ENCOUNTER — Other Ambulatory Visit: Payer: Self-pay

## 2020-08-12 VITALS — BP 110/70 | HR 97 | Temp 97.4°F | Resp 16 | Ht 65.0 in | Wt 199.0 lb

## 2020-08-12 DIAGNOSIS — L02212 Cutaneous abscess of back [any part, except buttock]: Secondary | ICD-10-CM

## 2020-08-12 NOTE — Progress Notes (Signed)
Established Patient Office Visit  Subjective:  Patient ID: James Richards, male    DOB: [birth year]  Age: 75 y.o. MRN: [id number]  CC:  Chief Complaint  Patient presents with   Wound Check    HPI James Richards presents for follow up abscess back. It is healing well, he has another epidermal cyst that is nor infected, leave alone.  He has seborrheic keratosis in ear folds that we will remove next.  Past Medical History:  Diagnosis Date   Atherosclerosis of native arteries of the extremities with intermittent claudication 10/09/2013   Atherosclerotic peripheral vascular disease with intermittent claudication ([place]) 10/03/2012   BMI 33.0-33.9,adult 07/07/2019   Carotid stenosis 69/45/0388   Diastolic dysfunction 82/80/0349   Essential hypertension    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   History of recurrent TIAs 12/11/2014   Impacted cerumen 11/26/2019   Malignant neoplasm of base of tongue ([place]) 01/21/2015   Malignant neoplasm of larynx, unspecified (HCC)    Migraine 04/02/2014   Mixed hyperlipidemia    Other fatigue    Other specified hypothyroidism    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    Restless legs syndrome    Restrictive lung disease 11/26/2019   TIA (transient ischemic attack) 11/24/2014    Past Surgical History:  Procedure Laterality Date   LYMPHADENECTOMY     PERIPHERAL VASCULAR CATHETERIZATION N/A 11/24/2014   Procedure: Aortic Arch Angiography;  Surgeon: Angelia Mould, MD;  Location: [facility];  Service: Cardiovascular;  Laterality: N/A;   PERIPHERAL VASCULAR CATHETERIZATION N/A 11/24/2014   Procedure: Cerebral Angiography;  Surgeon: Angelia Mould, MD;  Location: [facility];  Service: Cardiovascular;  Laterality: N/A;   THROAT SURGERY     TONSILECTOMY/ADENOIDECTOMY WITH MYRINGOTOMY      Family History  Problem Relation Age of Onset   Other Father        amputation   Deep vein thrombosis  Father    Heart disease Father        Atrial Fib.   Cancer Sister    Hypertension Brother     Social History   Socioeconomic History   Marital status: Divorced    Spouse name: Not on file   Number of children: Not on file   Years of education: Not on file   Highest education level: Not on file  Occupational History   Not on file  Tobacco Use   Smoking status: Former    Pack years: 0.00    Types: Cigarettes    Quit date: 02/15/2007    Years since quitting: 13.4   Smokeless tobacco: Former    Types: Nurse, children's Use: Never used  Substance and Sexual Activity   Alcohol use: No    Alcohol/week: 0.0 standard drinks   Drug use: No   Sexual activity: Not Currently    Partners: Female  Other Topics Concern   Not on file  Social History Narrative   Not on file   Social Determinants of Health   Financial Resource Strain: Low Risk    Difficulty of Paying Living Expenses: Not hard at all  Food Insecurity: No Food Insecurity   Worried About Charity fundraiser in the Last Year: Never true   Arboriculturist in the Last Year: Never true  Transportation Needs: No Transportation Needs   Lack of Transportation (Medical): No   Lack of Transportation (Non-Medical): No  Physical Activity: Inactive  Days of Exercise per Week: 0 days   Minutes of Exercise per Session: 0 min  Stress: No Stress Concern Present   Feeling of Stress : Not at all  Social Connections: Socially Integrated   Frequency of Communication with Friends and Family: More than three times a week   Frequency of Social Gatherings with Friends and Family: Twice a week   Attends Religious Services: More than 4 times per year   Active Member of [employer] or Organizations: Yes   Attends Music therapist: More than 4 times per year   Marital Status: Married  Human resources officer Violence: Not At Risk   Fear of Current or Ex-Partner: No   Emotionally Abused: No   Physically Abused: No   Sexually  Abused: No    Outpatient Medications Prior to Visit  Medication Sig Dispense Refill   aspirin EC 81 MG tablet Take 81 mg by mouth daily. Swallow whole.     atorvastatin (LIPITOR) 80 MG tablet TAKE 1 TABLET EVERY DAY 90 tablet 2   clopidogrel (PLAVIX) 75 MG tablet TAKE 1 TABLET EVERY DAY 90 tablet 2   levothyroxine (SYNTHROID) 125 MCG tablet Take 1 tablet (125 mcg total) by mouth daily. 90 tablet 2   minocycline (MINOCIN) 50 MG capsule TAKE 1 CAPSULE EVERY 12 HOURS 180 capsule 2   Omega-3 Fatty Acids (FISH OIL) 1200 MG CPDR Take 1 capsule by mouth daily.     omeprazole (PRILOSEC) 40 MG capsule TAKE 1 CAPSULE EVERY DAY 90 capsule 2   rOPINIRole (REQUIP) 0.5 MG tablet TAKE 1 TABLET AT BEDTIME 90 tablet 2   sulfamethoxazole-trimethoprim (BACTRIM DS) 800-160 MG tablet Take 1 tablet by mouth 2 (two) times daily. 20 tablet 0   furosemide (LASIX) 40 MG tablet Take 1 tablet (40 mg total) by mouth daily. 90 tablet 3   losartan (COZAAR) 50 MG tablet Take 1 tablet (50 mg total) by mouth daily. 90 tablet 3   No facility-administered medications prior to visit.    No Known Allergies  ROS Review of Systems  Constitutional:  Negative for chills, fatigue and fever.  HENT:  Negative for congestion, ear pain and sore throat.   Respiratory:  Negative for cough and shortness of breath.   Cardiovascular:  Negative for chest pain.  Gastrointestinal:  Negative for abdominal pain, constipation, diarrhea, nausea and vomiting.  Endocrine: Negative for polydipsia, polyphagia and polyuria.  Genitourinary:  Negative for dysuria and frequency.  Musculoskeletal:  Negative for arthralgias and myalgias.  Neurological:  Negative for dizziness and headaches.  Psychiatric/Behavioral:  Negative for dysphoric mood.        No dysphoria     Objective:    Physical Exam Vitals reviewed.  Constitutional:      Appearance: Normal appearance.  Skin:    Comments: Abscess on back is not draining and healing well   Neurological:     Mental Status: He is alert.    BP 110/70   Pulse 97   Temp (!) 97.4 F (36.3 C)   Resp 16   Ht '5\' 5"'  (1.651 m)   Wt 199 lb (90.3 kg)   SpO2 97%   BMI 33.12 kg/m  Wt Readings from Last 3 Encounters:  08/12/20 199 lb (90.3 kg)  08/06/20 200 lb (90.7 kg)  08/04/20 200 lb 9.6 oz (91 kg)     Health Maintenance Due  Topic Date Due   Hepatitis C Screening  Never done   TETANUS/TDAP  Never done   Zoster  Vaccines- Shingrix (1 of 2) Never done    There are no preventive care reminders to display for this patient.  Lab Results  Component Value Date   TSH 5.610 (H) 07/09/2020   Lab Results  Component Value Date   WBC 6.2 07/09/2020   HGB 13.0 07/09/2020   HCT 43.1 07/09/2020   MCV 92 07/09/2020   PLT 303 07/09/2020   Lab Results  Component Value Date   NA 139 07/09/2020   K 4.1 07/09/2020   CO2 23 07/09/2020   GLUCOSE 85 07/09/2020   BUN 16 07/09/2020   CREATININE 1.07 07/09/2020   BILITOT 0.5 07/09/2020   ALKPHOS 99 07/09/2020   AST 27 07/09/2020   ALT 28 07/09/2020   PROT 6.6 07/09/2020   ALBUMIN 4.2 07/09/2020   CALCIUM 9.3 07/09/2020   ANIONGAP 10 11/25/2014   EGFR 73 07/09/2020   Lab Results  Component Value Date   CHOL 117 07/09/2020   Lab Results  Component Value Date   HDL 48 07/09/2020   Lab Results  Component Value Date   LDLCALC 53 07/09/2020   Lab Results  Component Value Date   TRIG 81 07/09/2020   Lab Results  Component Value Date   CHOLHDL 2.4 07/09/2020   Lab Results  Component Value Date   HGBA1C 6.6 (H) 11/25/2014      Assessment & Plan:  Diagnoses and all orders for this visit: Abscess of back The abscess is healing well and no purulence     Follow-up: Return for keratosis ears removal.    Reinaldo Meeker, MD

## 2020-08-16 LAB — ANAEROBIC AND AEROBIC CULTURE

## 2020-08-16 NOTE — Progress Notes (Signed)
Mixed anaerobic organisms lp

## 2020-09-01 ENCOUNTER — Ambulatory Visit: Payer: Medicare PPO | Admitting: Legal Medicine

## 2020-09-29 DIAGNOSIS — Z961 Presence of intraocular lens: Secondary | ICD-10-CM | POA: Diagnosis not present

## 2020-09-29 DIAGNOSIS — H35431 Paving stone degeneration of retina, right eye: Secondary | ICD-10-CM | POA: Diagnosis not present

## 2020-09-29 DIAGNOSIS — H52223 Regular astigmatism, bilateral: Secondary | ICD-10-CM | POA: Diagnosis not present

## 2020-09-29 DIAGNOSIS — H43393 Other vitreous opacities, bilateral: Secondary | ICD-10-CM | POA: Diagnosis not present

## 2020-10-12 ENCOUNTER — Other Ambulatory Visit: Payer: Self-pay | Admitting: Legal Medicine

## 2020-10-12 ENCOUNTER — Telehealth: Payer: Self-pay

## 2020-10-12 MED ORDER — ACETAMINOPHEN-CODEINE #3 300-30 MG PO TABS: 1.0000 | ORAL_TABLET | ORAL | 0 refills | Status: DC | PRN

## 2020-10-12 NOTE — Telephone Encounter (Signed)
James Richards called requesting acetaminophen-codeine 300-30 mg takes one tablet three times daily PRN for neck pain. DO not see in epic but do see in health data achiever. Please advise. Pharmacy is [facility] order.   Royce Macadamia, [place] 10/12/20 10:19 AM

## 2020-10-12 NOTE — Telephone Encounter (Signed)
Made Miriam aware.   Royce Macadamia, [place] 10/12/20 11:17 AM

## 2020-10-12 NOTE — Telephone Encounter (Signed)
Sent in lp 

## 2020-11-18 ENCOUNTER — Ambulatory Visit: Payer: Medicare PPO | Admitting: Legal Medicine

## 2020-12-16 ENCOUNTER — Other Ambulatory Visit: Payer: Self-pay

## 2020-12-16 ENCOUNTER — Ambulatory Visit: Payer: Medicare PPO | Admitting: Legal Medicine

## 2020-12-16 ENCOUNTER — Encounter: Payer: Self-pay | Admitting: Legal Medicine

## 2020-12-16 VITALS — BP 142/90 | HR 61 | Temp 97.4°F | Resp 16 | Ht 65.0 in | Wt 201.0 lb

## 2020-12-16 DIAGNOSIS — Z23 Encounter for immunization: Secondary | ICD-10-CM

## 2020-12-16 DIAGNOSIS — I10 Essential (primary) hypertension: Secondary | ICD-10-CM | POA: Diagnosis not present

## 2020-12-16 DIAGNOSIS — G2581 Restless legs syndrome: Secondary | ICD-10-CM

## 2020-12-16 DIAGNOSIS — Z6833 Body mass index (BMI) 33.0-33.9, adult: Secondary | ICD-10-CM

## 2020-12-16 DIAGNOSIS — I5032 Chronic diastolic (congestive) heart failure: Secondary | ICD-10-CM | POA: Diagnosis not present

## 2020-12-16 DIAGNOSIS — I110 Hypertensive heart disease with heart failure: Secondary | ICD-10-CM

## 2020-12-16 DIAGNOSIS — E038 Other specified hypothyroidism: Secondary | ICD-10-CM

## 2020-12-16 DIAGNOSIS — I70213 Atherosclerosis of native arteries of extremities with intermittent claudication, bilateral legs: Secondary | ICD-10-CM

## 2020-12-16 DIAGNOSIS — R0602 Shortness of breath: Secondary | ICD-10-CM

## 2020-12-16 DIAGNOSIS — E782 Mixed hyperlipidemia: Secondary | ICD-10-CM

## 2020-12-16 NOTE — Progress Notes (Signed)
Subjective:  Patient ID: James Richards, male    DOB: 11/14/1945  Age: 75 y.o. MRN: [id number]  Chief Complaint  Patient presents with   Hypothyroidism   Hyperlipidemia    HPI: chronic  Patient is sob with ambulation.  Worse at night.  He gets extreme Doe with little exercise   Patient presents for follow up of hypertension.  Patient tolerating losartan well with side effects.  Patient was diagnosed with hypertension 2012 so has been treated for hypertension for 12 years.Patient is working on maintaining diet and exercise regimen and follows up as directed. Complication includenone   Patient presents with hyperlipidemia.  Compliance with treatment has been good; patient takes medicines as directed, maintains low cholesterol diet, follows up as directed, and maintains exercise regimen.  Patient is using atorvastatin without problems.   Patient gets doe with minimal movement.   Current Outpatient Medications on File Prior to Visit  Medication Sig Dispense Refill   acetaminophen-codeine (TYLENOL #3) 300-30 MG tablet Take 1 tablet by mouth every 4 (four) hours as needed for moderate pain. 90 tablet 0   aspirin EC 81 MG tablet Take 81 mg by mouth daily. Swallow whole.     atorvastatin (LIPITOR) 80 MG tablet TAKE 1 TABLET EVERY DAY 90 tablet 2   clopidogrel (PLAVIX) 75 MG tablet TAKE 1 TABLET EVERY DAY 90 tablet 2   furosemide (LASIX) 40 MG tablet Take 1 tablet (40 mg total) by mouth daily. 90 tablet 3   levothyroxine (SYNTHROID) 125 MCG tablet Take 1 tablet (125 mcg total) by mouth daily. 90 tablet 2   losartan (COZAAR) 50 MG tablet Take 1 tablet (50 mg total) by mouth daily. 90 tablet 3   minocycline (MINOCIN) 50 MG capsule TAKE 1 CAPSULE EVERY 12 HOURS 180 capsule 2   Omega-3 Fatty Acids (FISH OIL) 1200 MG CPDR Take 1 capsule by mouth daily.     omeprazole (PRILOSEC) 40 MG capsule TAKE 1 CAPSULE EVERY DAY 90 capsule 2   rOPINIRole (REQUIP) 0.5 MG tablet TAKE 1 TABLET AT BEDTIME 90  tablet 2   No current facility-administered medications on file prior to visit.   Past Medical History:  Diagnosis Date   Atherosclerosis of native arteries of the extremities with intermittent claudication 10/09/2013   Atherosclerotic peripheral vascular disease with intermittent claudication ([place]) 10/03/2012   BMI 33.0-33.9,adult 07/07/2019   Carotid stenosis 61/95/0932   Diastolic dysfunction 67/01/4579   Essential hypertension    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   History of recurrent TIAs 12/11/2014   Impacted cerumen 11/26/2019   Malignant neoplasm of base of tongue ([place]) 01/21/2015   Malignant neoplasm of larynx, unspecified (HCC)    Migraine 04/02/2014   Mixed hyperlipidemia    Other fatigue    Other specified hypothyroidism    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    Restless legs syndrome    Restrictive lung disease 11/26/2019   TIA (transient ischemic attack) 11/24/2014   Past Surgical History:  Procedure Laterality Date   LYMPHADENECTOMY     PERIPHERAL VASCULAR CATHETERIZATION N/A 11/24/2014   Procedure: Aortic Arch Angiography;  Surgeon: Angelia Mould, MD;  Location: [facility];  Service: Cardiovascular;  Laterality: N/A;   PERIPHERAL VASCULAR CATHETERIZATION N/A 11/24/2014   Procedure: Cerebral Angiography;  Surgeon: Angelia Mould, MD;  Location: [facility];  Service: Cardiovascular;  Laterality: N/A;   THROAT SURGERY     TONSILECTOMY/ADENOIDECTOMY WITH MYRINGOTOMY  Family History  Problem Relation Age of Onset   Other Father        amputation   Deep vein thrombosis Father    Heart disease Father        Atrial Fib.   Cancer Sister    Hypertension Brother    Social History   Socioeconomic History   Marital status: Divorced    Spouse name: Not on file   Number of children: Not on file   Years of education: Not on file   Highest education level: Not on file  Occupational History    Not on file  Tobacco Use   Smoking status: Former    Packs/day: 1.00    Years: 41.00    Pack years: 41.00    Types: Cigarettes    Quit date: 02/15/2007    Years since quitting: 13.8   Smokeless tobacco: Former    Types: Nurse, children's Use: Never used  Substance and Sexual Activity   Alcohol use: No    Alcohol/week: 0.0 standard drinks   Drug use: No   Sexual activity: Not Currently    Partners: Female  Other Topics Concern   Not on file  Social History Narrative   Not on file   Social Determinants of Health   Financial Resource Strain: Low Risk    Difficulty of Paying Living Expenses: Not hard at all  Food Insecurity: No Food Insecurity   Worried About Charity fundraiser in the Last Year: Never true   [place] in the Last Year: Never true  Transportation Needs: No Transportation Needs   Lack of Transportation (Medical): No   Lack of Transportation (Non-Medical): No  Physical Activity: Inactive   Days of Exercise per Week: 0 days   Minutes of Exercise per Session: 0 min  Stress: No Stress Concern Present   Feeling of Stress : Not at all  Social Connections: Socially Integrated   Frequency of Communication with Friends and Family: More than three times a week   Frequency of Social Gatherings with Friends and Family: Twice a week   Attends Religious Services: More than 4 times per year   Active Member of [employer] or Organizations: Yes   Attends Music therapist: More than 4 times per year   Marital Status: Married    Review of Systems  Constitutional:  Negative for chills, fatigue, fever and unexpected weight change.  HENT:  Negative for congestion, ear pain, sinus pain and sore throat.   Respiratory:  Positive for shortness of breath (Sometimes).   Cardiovascular:  Negative for chest pain and palpitations.  Gastrointestinal:  Negative for abdominal pain, blood in stool, constipation, diarrhea, nausea and vomiting.  Endocrine: Negative for  polydipsia.  Genitourinary:  Negative for dysuria.  Musculoskeletal:  Negative for back pain.  Skin:  Negative for rash.  Neurological:  Negative for headaches.    Objective:  BP (!) 142/90   Pulse 61   Temp (!) 97.4 F (36.3 C)   Resp 16   Ht 5\' 5"  (1.651 m)   Wt 201 lb (91.2 kg)   SpO2 98%   BMI 33.45 kg/m   BP/Weight 12/16/2020 08/12/2020 2/84/1324  Systolic BP 401 027 253  Diastolic BP 90 70 80  Wt. (Lbs) 201 199 200  BMI 33.45 33.12 33.28    Physical Exam Vitals reviewed.  Constitutional:      Appearance: Normal appearance. He is obese.  HENT:  Head: Normocephalic.     Right Ear: Tympanic membrane, ear canal and external ear normal.     Left Ear: Tympanic membrane, ear canal and external ear normal.     Mouth/Throat:     Mouth: Mucous membranes are dry.     Pharynx: Oropharynx is clear.  Cardiovascular:     Rate and Rhythm: Normal rate and regular rhythm.     Pulses: Normal pulses.     Heart sounds: Normal heart sounds. No murmur heard.   No gallop.  Pulmonary:     Effort: Pulmonary effort is normal. No respiratory distress.     Breath sounds: Normal breath sounds. No wheezing.  Abdominal:     General: Abdomen is flat. Bowel sounds are normal. There is no distension.     Palpations: Abdomen is soft.     Tenderness: There is no abdominal tenderness.  Musculoskeletal:        General: Normal range of motion.     Cervical back: Normal range of motion.  Skin:    General: Skin is warm.     Capillary Refill: Capillary refill takes less than 2 seconds.  Neurological:     General: No focal deficit present.     Mental Status: He is alert and oriented to person, place, and time. Mental status is at baseline.     Gait: Gait abnormal.        Lab Results  Component Value Date   WBC 6.2 07/09/2020   HGB 13.0 07/09/2020   HCT 43.1 07/09/2020   PLT 303 07/09/2020   GLUCOSE 85 07/09/2020   CHOL 117 07/09/2020   TRIG 81 07/09/2020   HDL 48 07/09/2020    LDLCALC 53 07/09/2020   ALT 28 07/09/2020   AST 27 07/09/2020   NA 139 07/09/2020   K 4.1 07/09/2020   CL 99 07/09/2020   CREATININE 1.07 07/09/2020   BUN 16 07/09/2020   CO2 23 07/09/2020   TSH 5.610 (H) 07/09/2020   INR 1.0 12/18/2019   HGBA1C 6.6 (H) 11/25/2014      Assessment & Plan:   Problem List Items Addressed This Visit       Cardiovascular and Mediastinum   Atherosclerosis of native artery of extremity with intermittent claudication [facility]) Patient has PVD    Essential hypertension An individual hypertension care plan was established and reinforced today.  The patient's status was assessed using clinical findings on exam and labs or diagnostic tests. The patient's success at meeting treatment goals on disease specific evidence-based guidelines and found to be well controlled. SELF MANAGEMENT: The patient and I together assessed ways to personally work towards obtaining the recommended goals. RECOMMENDATIONS: avoid decongestants found in common cold remedies, decrease consumption of alcohol, perform routine monitoring of BP with home BP cuff, exercise, reduction of dietary salt, take medicines as prescribed, try not to miss doses and quit smoking.  Regular exercise and maintaining a healthy weight is needed.  Stress reduction may help. A CLINICAL SUMMARY including written plan identify barriers to care unique to individual due to social or financial issues.  We attempt to mutually creat solutions for individual and family understanding.     Hypertensive heart disease with chronic diastolic congestive heart failure ([place]) - Primary   Relevant Orders   Comprehensive metabolic panel   CBC with Differential/Platelet An individual hypertension care plan was established and reinforced today.  The patient's status was assessed using clinical findings on exam and labs or diagnostic tests. The patient's success  at meeting treatment goals on disease specific evidence-based guidelines  and found to be well controlled. SELF MANAGEMENT: The patient and I together assessed ways to personally work towards obtaining the recommended goals. RECOMMENDATIONS: avoid decongestants found in common cold remedies, decrease consumption of alcohol, perform routine monitoring of BP with home BP cuff, exercise, reduction of dietary salt, take medicines as prescribed, try not to miss doses and quit smoking.  Regular exercise and maintaining a healthy weight is needed.  Stress reduction may help. A CLINICAL SUMMARY including written plan identify barriers to care unique to individual due to social or financial issues.  We attempt to mutually creat solutions for individual and family understanding.      Endocrine   Other specified hypothyroidism   Relevant Orders   TSH Patient is known to have hypothyroidism and is n treatment with levothyroxine 182mcg.  Patient was diagnosed 10 years ago.  Other treatment includes none.  Patient is compliant with medicines and last TSH 6 months ago.  Last TSH was normal.      Other   Mixed hyperlipidemia   Relevant Orders   Lipid panel AN INDIVIDUAL CARE PLAN for hyperlipidemia/ cholesterol was established and reinforced today.  The patient's status was assessed using clinical findings on exam, lab and other diagnostic tests. The patient's disease status was assessed based on evidence-based guidelines and found to be well controlled. MEDICATIONS were reviewed. SELF MANAGEMENT GOALS have been discussed and patient's success at attaining the goal of low cholesterol was assessed. RECOMMENDATION given include regular exercise 3 days a week and low cholesterol/low fat diet. CLINICAL SUMMARY including written plan to identify barriers unique to the patient due to social or economic  reasons was discussed.     Restless legs syndrome Patient has RLS and is on mediicnes    BMI 33.0-33.9,adult An individualize plan was formulated for obesity using patient history and  physical exam to encourage weight loss.  An evidence based program was formulated.  Patient is to cut meal size with meals and to plan physical exercise 3 days a week at least 20 minutes.  Weight watchers and other programs are helpful.  Planned amount of weight loss 10 lbs.    Other Visit Diagnoses     SOB (shortness of breath)       Relevant Orders   Pulmonary Function Test Patient has DOE but 6 minute walk showed resting O2 98% and walking 92%, refer to pulmonary   Need for immunization against influenza       Relevant Orders   Flu Vaccine QUAD High Dose(Fluad) (Completed)     .30 minute visit, review of records     Orders Placed This Encounter  Procedures   Flu Vaccine QUAD High Dose(Fluad)   Comprehensive metabolic panel   Lipid panel   TSH   CBC with Differential/Platelet     Follow-up: Return in about 4 months (around 04/15/2021) for fasting.  An After Visit Summary was printed and given to the patient.  Reinaldo Meeker, MD [facility] [phone]

## 2020-12-17 ENCOUNTER — Ambulatory Visit: Payer: Medicare PPO | Admitting: Legal Medicine

## 2020-12-17 ENCOUNTER — Other Ambulatory Visit: Payer: Self-pay

## 2020-12-17 DIAGNOSIS — E038 Other specified hypothyroidism: Secondary | ICD-10-CM

## 2020-12-17 LAB — CBC WITH DIFFERENTIAL/PLATELET
Basophils Absolute: 0.1 10*3/uL (ref 0.0–0.2)
Basos: 1 %
EOS (ABSOLUTE): 0.3 10*3/uL (ref 0.0–0.4)
Eos: 4 %
Hematocrit: 40.1 % (ref 37.5–51.0)
Hemoglobin: 12.6 g/dL — ABNORMAL LOW (ref 13.0–17.7)
Immature Grans (Abs): 0 10*3/uL (ref 0.0–0.1)
Immature Granulocytes: 0 %
Lymphocytes Absolute: 1 10*3/uL (ref 0.7–3.1)
Lymphs: 16 %
MCH: 28.4 pg (ref 26.6–33.0)
MCHC: 31.4 g/dL — ABNORMAL LOW (ref 31.5–35.7)
MCV: 91 fL (ref 79–97)
Monocytes Absolute: 0.7 10*3/uL (ref 0.1–0.9)
Monocytes: 11 %
Neutrophils Absolute: 4.4 10*3/uL (ref 1.4–7.0)
Neutrophils: 68 %
Platelets: 280 10*3/uL (ref 150–450)
RBC: 4.43 x10E6/uL (ref 4.14–5.80)
RDW: 14.7 % (ref 11.6–15.4)
WBC: 6.4 10*3/uL (ref 3.4–10.8)

## 2020-12-17 LAB — LIPID PANEL
Chol/HDL Ratio: 2.5 ratio (ref 0.0–5.0)
Cholesterol, Total: 119 mg/dL (ref 100–199)
HDL: 47 mg/dL (ref >39)
LDL Chol Calc (NIH): 58 mg/dL (ref 0–99)
Triglycerides: 67 mg/dL (ref 0–149)
VLDL Cholesterol Cal: 14 mg/dL (ref 5–40)

## 2020-12-17 LAB — TSH: TSH: 18.8 u[IU]/mL — ABNORMAL HIGH (ref 0.450–4.500)

## 2020-12-17 LAB — COMPREHENSIVE METABOLIC PANEL
ALT: 24 IU/L (ref 0–44)
AST: 27 IU/L (ref 0–40)
Albumin/Globulin Ratio: 1.9 (ref 1.2–2.2)
Albumin: 4.2 g/dL (ref 3.7–4.7)
Alkaline Phosphatase: 85 IU/L (ref 44–121)
BUN/Creatinine Ratio: 16 (ref 10–24)
BUN: 19 mg/dL (ref 8–27)
Bilirubin Total: 0.5 mg/dL (ref 0.0–1.2)
CO2: 29 mmol/L (ref 20–29)
Calcium: 9.8 mg/dL (ref 8.6–10.2)
Chloride: 101 mmol/L (ref 96–106)
Creatinine, Ser: 1.16 mg/dL (ref 0.76–1.27)
Globulin, Total: 2.2 g/dL (ref 1.5–4.5)
Glucose: 96 mg/dL (ref 70–99)
Potassium: 4.9 mmol/L (ref 3.5–5.2)
Sodium: 141 mmol/L (ref 134–144)
Total Protein: 6.4 g/dL (ref 6.0–8.5)
eGFR: 66 mL/min/{1.73_m2} (ref >59)

## 2020-12-17 LAB — CARDIOVASCULAR RISK ASSESSMENT

## 2020-12-17 MED ORDER — LEVOTHYROXINE SODIUM 125 MCG PO TABS: 125.0000 ug | ORAL_TABLET | Freq: Every day | ORAL | 2 refills | Status: DC

## 2020-12-17 NOTE — Progress Notes (Signed)
Kidney and liver tests normal, cholesterol normal, TSH 18.8, has he stopped thyroid medicine, he should be on 168mcg, mild anemia lp

## 2020-12-26 ENCOUNTER — Other Ambulatory Visit: Payer: Self-pay | Admitting: Cardiology

## 2020-12-26 ENCOUNTER — Other Ambulatory Visit: Payer: Self-pay | Admitting: Legal Medicine

## 2021-01-11 ENCOUNTER — Other Ambulatory Visit: Payer: Self-pay

## 2021-01-11 DIAGNOSIS — R0602 Shortness of breath: Secondary | ICD-10-CM

## 2021-01-12 DIAGNOSIS — R0600 Dyspnea, unspecified: Secondary | ICD-10-CM | POA: Diagnosis not present

## 2021-01-12 DIAGNOSIS — J84112 Idiopathic pulmonary fibrosis: Secondary | ICD-10-CM | POA: Diagnosis not present

## 2021-02-10 DIAGNOSIS — R911 Solitary pulmonary nodule: Secondary | ICD-10-CM | POA: Diagnosis not present

## 2021-02-10 DIAGNOSIS — I700 Atherosclerosis of aorta: Secondary | ICD-10-CM | POA: Diagnosis not present

## 2021-02-10 DIAGNOSIS — J432 Centrilobular emphysema: Secondary | ICD-10-CM | POA: Diagnosis not present

## 2021-02-10 DIAGNOSIS — J84112 Idiopathic pulmonary fibrosis: Secondary | ICD-10-CM | POA: Diagnosis not present

## 2021-02-10 DIAGNOSIS — I2510 Atherosclerotic heart disease of native coronary artery without angina pectoris: Secondary | ICD-10-CM | POA: Diagnosis not present

## 2021-02-10 DIAGNOSIS — J479 Bronchiectasis, uncomplicated: Secondary | ICD-10-CM | POA: Diagnosis not present

## 2021-02-16 DIAGNOSIS — J84112 Idiopathic pulmonary fibrosis: Secondary | ICD-10-CM | POA: Diagnosis not present

## 2021-02-16 DIAGNOSIS — R0600 Dyspnea, unspecified: Secondary | ICD-10-CM | POA: Diagnosis not present

## 2021-02-17 DIAGNOSIS — G4733 Obstructive sleep apnea (adult) (pediatric): Secondary | ICD-10-CM | POA: Diagnosis not present

## 2021-02-17 DIAGNOSIS — Z711 Person with feared health complaint in whom no diagnosis is made: Secondary | ICD-10-CM | POA: Diagnosis not present

## 2021-03-10 ENCOUNTER — Other Ambulatory Visit: Payer: Self-pay | Admitting: Legal Medicine

## 2021-03-10 DIAGNOSIS — G2581 Restless legs syndrome: Secondary | ICD-10-CM

## 2021-03-29 ENCOUNTER — Emergency Department (HOSPITAL_COMMUNITY): Payer: Medicare PPO

## 2021-03-29 ENCOUNTER — Observation Stay (HOSPITAL_COMMUNITY): Payer: Medicare PPO

## 2021-03-29 ENCOUNTER — Other Ambulatory Visit: Payer: Self-pay

## 2021-03-29 ENCOUNTER — Telehealth: Payer: Self-pay | Admitting: Legal Medicine

## 2021-03-29 ENCOUNTER — Observation Stay (HOSPITAL_COMMUNITY)
Admission: EM | Admit: 2021-03-29 | Discharge: 2021-03-30 | Disposition: A | Discharge status: Home / Self Care | Payer: Medicare PPO | Attending: Internal Medicine | Admitting: Internal Medicine

## 2021-03-29 ENCOUNTER — Encounter (HOSPITAL_COMMUNITY): Payer: Self-pay

## 2021-03-29 DIAGNOSIS — G2581 Restless legs syndrome: Secondary | ICD-10-CM | POA: Diagnosis not present

## 2021-03-29 DIAGNOSIS — I10 Essential (primary) hypertension: Secondary | ICD-10-CM | POA: Diagnosis not present

## 2021-03-29 DIAGNOSIS — Z8521 Personal history of malignant neoplasm of larynx: Secondary | ICD-10-CM | POA: Insufficient documentation

## 2021-03-29 DIAGNOSIS — Z7982 Long term (current) use of aspirin: Secondary | ICD-10-CM | POA: Insufficient documentation

## 2021-03-29 DIAGNOSIS — Z79899 Other long term (current) drug therapy: Secondary | ICD-10-CM | POA: Diagnosis not present

## 2021-03-29 DIAGNOSIS — I6612 Occlusion and stenosis of left anterior cerebral artery: Secondary | ICD-10-CM | POA: Diagnosis not present

## 2021-03-29 DIAGNOSIS — Z20822 Contact with and (suspected) exposure to covid-19: Secondary | ICD-10-CM | POA: Insufficient documentation

## 2021-03-29 DIAGNOSIS — I669 Occlusion and stenosis of unspecified cerebral artery: Secondary | ICD-10-CM

## 2021-03-29 DIAGNOSIS — Z8673 Personal history of transient ischemic attack (TIA), and cerebral infarction without residual deficits: Secondary | ICD-10-CM | POA: Diagnosis not present

## 2021-03-29 DIAGNOSIS — G453 Amaurosis fugax: Secondary | ICD-10-CM | POA: Insufficient documentation

## 2021-03-29 DIAGNOSIS — I440 Atrioventricular block, first degree: Secondary | ICD-10-CM | POA: Diagnosis not present

## 2021-03-29 DIAGNOSIS — E039 Hypothyroidism, unspecified: Secondary | ICD-10-CM | POA: Insufficient documentation

## 2021-03-29 DIAGNOSIS — I5032 Chronic diastolic (congestive) heart failure: Secondary | ICD-10-CM | POA: Diagnosis not present

## 2021-03-29 DIAGNOSIS — Z0101 Encounter for examination of eyes and vision with abnormal findings: Secondary | ICD-10-CM

## 2021-03-29 DIAGNOSIS — H5462 Unqualified visual loss, left eye, normal vision right eye: Secondary | ICD-10-CM

## 2021-03-29 DIAGNOSIS — H43393 Other vitreous opacities, bilateral: Secondary | ICD-10-CM | POA: Diagnosis not present

## 2021-03-29 DIAGNOSIS — I6522 Occlusion and stenosis of left carotid artery: Secondary | ICD-10-CM | POA: Insufficient documentation

## 2021-03-29 DIAGNOSIS — I6782 Cerebral ischemia: Secondary | ICD-10-CM | POA: Insufficient documentation

## 2021-03-29 DIAGNOSIS — H348322 Tributary (branch) retinal vein occlusion, left eye, stable: Secondary | ICD-10-CM | POA: Diagnosis not present

## 2021-03-29 DIAGNOSIS — H52223 Regular astigmatism, bilateral: Secondary | ICD-10-CM | POA: Diagnosis not present

## 2021-03-29 DIAGNOSIS — I639 Cerebral infarction, unspecified: Secondary | ICD-10-CM

## 2021-03-29 DIAGNOSIS — I6602 Occlusion and stenosis of left middle cerebral artery: Secondary | ICD-10-CM | POA: Diagnosis not present

## 2021-03-29 DIAGNOSIS — H35431 Paving stone degeneration of retina, right eye: Secondary | ICD-10-CM | POA: Diagnosis not present

## 2021-03-29 DIAGNOSIS — J984 Other disorders of lung: Secondary | ICD-10-CM | POA: Insufficient documentation

## 2021-03-29 DIAGNOSIS — R2689 Other abnormalities of gait and mobility: Secondary | ICD-10-CM | POA: Diagnosis not present

## 2021-03-29 DIAGNOSIS — I110 Hypertensive heart disease with heart failure: Secondary | ICD-10-CM | POA: Diagnosis not present

## 2021-03-29 DIAGNOSIS — I651 Occlusion and stenosis of basilar artery: Secondary | ICD-10-CM | POA: Insufficient documentation

## 2021-03-29 DIAGNOSIS — K449 Diaphragmatic hernia without obstruction or gangrene: Secondary | ICD-10-CM | POA: Diagnosis not present

## 2021-03-29 DIAGNOSIS — I517 Cardiomegaly: Secondary | ICD-10-CM | POA: Diagnosis not present

## 2021-03-29 DIAGNOSIS — Z961 Presence of intraocular lens: Secondary | ICD-10-CM | POA: Diagnosis not present

## 2021-03-29 DIAGNOSIS — H53462 Homonymous bilateral field defects, left side: Secondary | ICD-10-CM | POA: Diagnosis not present

## 2021-03-29 DIAGNOSIS — Z7902 Long term (current) use of antithrombotics/antiplatelets: Secondary | ICD-10-CM | POA: Insufficient documentation

## 2021-03-29 DIAGNOSIS — D649 Anemia, unspecified: Secondary | ICD-10-CM | POA: Insufficient documentation

## 2021-03-29 DIAGNOSIS — I6501 Occlusion and stenosis of right vertebral artery: Secondary | ICD-10-CM | POA: Diagnosis not present

## 2021-03-29 DIAGNOSIS — Z85810 Personal history of malignant neoplasm of tongue: Secondary | ICD-10-CM | POA: Diagnosis not present

## 2021-03-29 DIAGNOSIS — H3412 Central retinal artery occlusion, left eye: Secondary | ICD-10-CM | POA: Diagnosis not present

## 2021-03-29 DIAGNOSIS — I2510 Atherosclerotic heart disease of native coronary artery without angina pectoris: Secondary | ICD-10-CM | POA: Insufficient documentation

## 2021-03-29 DIAGNOSIS — I6623 Occlusion and stenosis of bilateral posterior cerebral arteries: Secondary | ICD-10-CM | POA: Diagnosis not present

## 2021-03-29 HISTORY — DX: Encounter for examination of eyes and vision with abnormal findings: Z01.01

## 2021-03-29 LAB — I-STAT CHEM 8, ED
BUN: 23 mg/dL (ref 8–23)
Calcium, Ion: 1.21 mmol/L (ref 1.15–1.40)
Chloride: 104 mmol/L (ref 98–111)
Creatinine, Ser: 0.9 mg/dL (ref 0.61–1.24)
Glucose, Bld: 93 mg/dL (ref 70–99)
HCT: 33 % — ABNORMAL LOW (ref 39.0–52.0)
Hemoglobin: 11.2 g/dL — ABNORMAL LOW (ref 13.0–17.0)
Potassium: 5 mmol/L (ref 3.5–5.1)
Sodium: 139 mmol/L (ref 135–145)
TCO2: 29 mmol/L (ref 22–32)

## 2021-03-29 LAB — CBC WITH DIFFERENTIAL/PLATELET
Abs Immature Granulocytes: 0.02 10*3/uL (ref 0.00–0.07)
Basophils Absolute: 0.1 10*3/uL (ref 0.0–0.1)
Basophils Relative: 1 %
Eosinophils Absolute: 0.3 10*3/uL (ref 0.0–0.5)
Eosinophils Relative: 5 %
HCT: 35.2 % — ABNORMAL LOW (ref 39.0–52.0)
Hemoglobin: 10.8 g/dL — ABNORMAL LOW (ref 13.0–17.0)
Immature Granulocytes: 0 %
Lymphocytes Relative: 16 %
Lymphs Abs: 1 10*3/uL (ref 0.7–4.0)
MCH: 27.2 pg (ref 26.0–34.0)
MCHC: 30.7 g/dL (ref 30.0–36.0)
MCV: 88.7 fL (ref 80.0–100.0)
Monocytes Absolute: 0.7 10*3/uL (ref 0.1–1.0)
Monocytes Relative: 11 %
Neutro Abs: 4.3 10*3/uL (ref 1.7–7.7)
Neutrophils Relative %: 67 %
Platelets: 325 10*3/uL (ref 150–400)
RBC: 3.97 MIL/uL — ABNORMAL LOW (ref 4.22–5.81)
RDW: 15.8 % — ABNORMAL HIGH (ref 11.5–15.5)
WBC: 6.4 10*3/uL (ref 4.0–10.5)
nRBC: 0 % (ref 0.0–0.2)

## 2021-03-29 LAB — COMPREHENSIVE METABOLIC PANEL
ALT: 25 U/L (ref 0–44)
AST: 27 U/L (ref 15–41)
Albumin: 3.4 g/dL — ABNORMAL LOW (ref 3.5–5.0)
Alkaline Phosphatase: 69 U/L (ref 38–126)
Anion gap: 7 (ref 5–15)
BUN: 16 mg/dL (ref 8–23)
CO2: 26 mmol/L (ref 22–32)
Calcium: 9.2 mg/dL (ref 8.9–10.3)
Chloride: 106 mmol/L (ref 98–111)
Creatinine, Ser: 0.96 mg/dL (ref 0.61–1.24)
GFR, Estimated: >60 mL/min (ref >60)
Glucose, Bld: 94 mg/dL (ref 70–99)
Potassium: 4.2 mmol/L (ref 3.5–5.1)
Sodium: 139 mmol/L (ref 135–145)
Total Bilirubin: 0.5 mg/dL (ref 0.3–1.2)
Total Protein: 6.3 g/dL — ABNORMAL LOW (ref 6.5–8.1)

## 2021-03-29 LAB — RAPID URINE DRUG SCREEN, HOSP PERFORMED
Amphetamines: NOT DETECTED
Barbiturates: NOT DETECTED
Benzodiazepines: NOT DETECTED
Cocaine: NOT DETECTED
Opiates: NOT DETECTED
Tetrahydrocannabinol: NOT DETECTED

## 2021-03-29 LAB — APTT: aPTT: 25 seconds (ref 24–36)

## 2021-03-29 LAB — URINALYSIS, ROUTINE W REFLEX MICROSCOPIC
Bilirubin Urine: NEGATIVE
Glucose, UA: NEGATIVE mg/dL
Hgb urine dipstick: NEGATIVE
Ketones, ur: NEGATIVE mg/dL
Leukocytes,Ua: NEGATIVE
Nitrite: NEGATIVE
Protein, ur: NEGATIVE mg/dL
Specific Gravity, Urine: <1.005 — ABNORMAL LOW (ref 1.005–1.030)
pH: 6.5 (ref 5.0–8.0)

## 2021-03-29 LAB — ETHANOL: Alcohol, Ethyl (B): <10 mg/dL (ref <10)

## 2021-03-29 LAB — RESP PANEL BY RT-PCR (FLU A&B, COVID) ARPGX2
Influenza A by PCR: NEGATIVE
Influenza B by PCR: NEGATIVE
SARS Coronavirus 2 by RT PCR: NEGATIVE

## 2021-03-29 LAB — PROTIME-INR
INR: 1 (ref 0.8–1.2)
Prothrombin Time: 12.8 seconds (ref 11.4–15.2)

## 2021-03-29 MED ORDER — ENOXAPARIN SODIUM 40 MG/0.4ML IJ SOSY
40.0000 mg | PREFILLED_SYRINGE | INTRAMUSCULAR | Status: DC
  Administered 2021-03-29: 40 mg via SUBCUTANEOUS
  Filled 2021-03-29: qty 0.4

## 2021-03-29 MED ORDER — STROKE: EARLY STAGES OF RECOVERY BOOK
Freq: Once | Status: AC
  Filled 2021-03-29: qty 1

## 2021-03-29 MED ORDER — ACETAMINOPHEN 650 MG RE SUPP: 650.0000 mg | RECTAL | Status: DC | PRN

## 2021-03-29 MED ORDER — ACETAMINOPHEN 325 MG PO TABS: 650.0000 mg | ORAL_TABLET | ORAL | Status: DC | PRN

## 2021-03-29 MED ORDER — LOSARTAN POTASSIUM 50 MG PO TABS
50.0000 mg | ORAL_TABLET | Freq: Every day | ORAL | Status: DC
Start: 2021-03-29 — End: 2021-03-30
  Administered 2021-03-29 – 2021-03-30 (×2): 50 mg via ORAL
  Filled 2021-03-29 (×2): qty 1

## 2021-03-29 MED ORDER — LEVOTHYROXINE SODIUM 100 MCG PO TABS
100.0000 ug | ORAL_TABLET | Freq: Every day | ORAL | Status: DC
  Administered 2021-03-30: 100 ug via ORAL
  Filled 2021-03-29: qty 1

## 2021-03-29 MED ORDER — IOHEXOL 350 MG/ML SOLN
80.0000 mL | Freq: Once | INTRAVENOUS | Status: AC | PRN
  Administered 2021-03-29: 80 mL via INTRAVENOUS

## 2021-03-29 MED ORDER — MINOCYCLINE HCL 50 MG PO CAPS
50.0000 mg | ORAL_CAPSULE | Freq: Every day | ORAL | Status: DC
Start: 2021-03-29 — End: 2021-03-30
  Administered 2021-03-29 – 2021-03-30 (×2): 50 mg via ORAL
  Filled 2021-03-29 (×2): qty 1

## 2021-03-29 MED ORDER — ATORVASTATIN CALCIUM 80 MG PO TABS
80.0000 mg | ORAL_TABLET | Freq: Every day | ORAL | Status: DC
  Administered 2021-03-29 – 2021-03-30 (×2): 80 mg via ORAL
  Filled 2021-03-29: qty 1
  Filled 2021-03-29: qty 2

## 2021-03-29 MED ORDER — SENNOSIDES-DOCUSATE SODIUM 8.6-50 MG PO TABS: 1.0000 | ORAL_TABLET | Freq: Every evening | ORAL | Status: DC | PRN

## 2021-03-29 MED ORDER — ROPINIROLE HCL 1 MG PO TABS
0.5000 mg | ORAL_TABLET | Freq: Every evening | ORAL | Status: DC | PRN
  Administered 2021-03-30: 0.5 mg via ORAL
  Filled 2021-03-29: qty 1

## 2021-03-29 MED ORDER — FUROSEMIDE 40 MG PO TABS
40.0000 mg | ORAL_TABLET | Freq: Every day | ORAL | Status: DC
  Administered 2021-03-29 – 2021-03-30 (×2): 40 mg via ORAL
  Filled 2021-03-29: qty 1
  Filled 2021-03-29: qty 2

## 2021-03-29 MED ORDER — ACETAMINOPHEN 160 MG/5ML PO SOLN: 650.0000 mg | ORAL | Status: DC | PRN

## 2021-03-29 MED ORDER — ACETAMINOPHEN-CODEINE #3 300-30 MG PO TABS: 1.0000 | ORAL_TABLET | ORAL | Status: DC | PRN

## 2021-03-29 MED ORDER — PANTOPRAZOLE SODIUM 40 MG PO TBEC
40.0000 mg | DELAYED_RELEASE_TABLET | Freq: Every day | ORAL | Status: DC
  Administered 2021-03-29 – 2021-03-30 (×2): 40 mg via ORAL
  Filled 2021-03-29 (×2): qty 1

## 2021-03-29 MED ORDER — OMEGA-3-ACID ETHYL ESTERS 1 G PO CAPS
1.0000 | ORAL_CAPSULE | Freq: Every day | ORAL | Status: DC
  Administered 2021-03-30: 1 g via ORAL
  Filled 2021-03-29 (×2): qty 1

## 2021-03-29 MED ORDER — ASPIRIN EC 81 MG PO TBEC
81.0000 mg | DELAYED_RELEASE_TABLET | Freq: Every day | ORAL | Status: DC
  Administered 2021-03-29: 81 mg via ORAL
  Filled 2021-03-29: qty 1

## 2021-03-29 MED ORDER — FLUTICASONE FUROATE-VILANTEROL 100-25 MCG/ACT IN AEPB
1.0000 | INHALATION_SPRAY | Freq: Every day | RESPIRATORY_TRACT | Status: DC
  Administered 2021-03-30: 1 via RESPIRATORY_TRACT
  Filled 2021-03-29: qty 28

## 2021-03-29 MED ORDER — ASPIRIN EC 325 MG PO TBEC
325.0000 mg | DELAYED_RELEASE_TABLET | Freq: Every day | ORAL | Status: DC
  Administered 2021-03-30: 325 mg via ORAL
  Filled 2021-03-29: qty 1

## 2021-03-29 MED ORDER — ROPINIROLE HCL 1 MG PO TABS
0.5000 mg | ORAL_TABLET | Freq: Once | ORAL | Status: AC
  Administered 2021-03-29: 0.5 mg via ORAL
  Filled 2021-03-29: qty 1

## 2021-03-29 MED ORDER — CLOPIDOGREL BISULFATE 75 MG PO TABS
75.0000 mg | ORAL_TABLET | Freq: Every day | ORAL | Status: DC
  Administered 2021-03-29 – 2021-03-30 (×2): 75 mg via ORAL
  Filled 2021-03-29 (×2): qty 1

## 2021-03-29 NOTE — ED Provider Triage Note (Signed)
Emergency Medicine Provider Triage Evaluation Note  James Richards , a 76 y.o. male  was evaluated in triage.  Pt complains of lost vision in left eye on Saturday around 2200. Followed-up with eye doctor today who saw retinal vein occlusion. H/o strokes. Takes 81mg  but denies any blood thinner. He denies any weakness, chest pain, SOB. The patient reports he has some of his vision back in his left eye.   Review of Systems  Positive: Vision loss Negative: Headache, weakness, chest pain, SOB  Physical Exam  BP (!) 174/107    Pulse 72    Temp 98.2 F (36.8 C) (Oral)    Resp 14    SpO2 100%  Gen:   Awake, no distress   Resp:  Normal effort  MSK:   Moves extremities without difficulty  Other:  Unequal pupils L>R, but both are reactive to light.   Medical Decision Making  Medically screening exam initiated at 11:57 AM.  Appropriate orders placed.  [name] was informed that the remainder of the evaluation will be completed by another provider, this initial triage assessment does not replace that evaluation, and the importance of remaining in the ED until their evaluation is complete.  The patient was sent over by Dr. 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 Labs and imaging placed. The patient is out of the window for any stroke intervention.    Sherrell Puller, PA-C 03/29/21 1205

## 2021-03-29 NOTE — H&P (Signed)
History and Physical    James Richards ZOX:[id number] DOB: 11/23/1945 DOA: 03/29/2021  PCP: Lillard Anes, MD (Confirm with patient/family/NH records and if not entered, this has to be entered at [facility] point of entry) Patient coming from: Home  I have personally briefly reviewed patient's old medical records in [place]  Chief Complaint: Left eye blurry  HPI: James Richards is a 76 y.o. 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  Patient was watching TV Saturday night and suddenly lost left-sided patient, which he described as " a curtain fell down".  He complained to sleep, and woke up Sunday morning with some recovery of left vision.  Throughout today, the left-sided vision continues to improve, this morning, able to watch TV with left eye, denies any dark spot. He went to see ophthalmologist today who was concerned for left retinal vein occlusion and sent patient to ED.  Patient denies any headache or eye pain, he further denies any numbness weakness of any of the limbs, no speech changes, no balance problems.  ED Course: CT venogram negative for thrombosis, CTA of the head showed age indeterminant occlusion of left ICA with reconstitution at carotid terminus via patent left A1 ACA.  Left MCA small but opacified.  Attenuated distal MCA vessel, chronic occluded right vertebral artery, small/hypoplastic right P1 PCA with suspected superimposed severe stenosis.  Imaging was reviewed by neurology, impression is most changes are chronic.  Recommend patient to have stroke work-up with brain MRI.  Review of Systems: As per HPI otherwise 14 point review of systems negative.    Past Medical History:  Diagnosis Date   Atherosclerosis of native  arteries of the extremities with intermittent claudication 10/09/2013   Atherosclerotic peripheral vascular disease with intermittent claudication ([place]) 10/03/2012   BMI 33.0-33.9,adult 07/07/2019   Carotid stenosis 81/19/1478   Diastolic dysfunction 29/56/2130   Essential hypertension    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   History of recurrent TIAs 12/11/2014   Impacted cerumen 11/26/2019   Malignant neoplasm of base of tongue ([place]) 01/21/2015   Malignant neoplasm of larynx, unspecified (HCC)    Migraine 04/02/2014   Mixed hyperlipidemia    Other fatigue    Other specified hypothyroidism    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    Restless legs syndrome    Restrictive lung disease 11/26/2019   TIA (transient ischemic attack) 11/24/2014    Past Surgical History:  Procedure Laterality Date   LYMPHADENECTOMY     PERIPHERAL VASCULAR CATHETERIZATION N/A 11/24/2014   Procedure: Aortic Arch Angiography;  Surgeon: Angelia Mould, MD;  Location: [facility] [place] [facility];  Service: Cardiovascular;  Laterality: N/A;   PERIPHERAL VASCULAR CATHETERIZATION N/A 11/24/2014   Procedure: Cerebral Angiography;  Surgeon: Angelia Mould, MD;  Location: [place];  Service: Cardiovascular;  Laterality: N/A;   THROAT SURGERY     TONSILECTOMY/ADENOIDECTOMY WITH MYRINGOTOMY       reports that he quit smoking about 14 years ago. His smoking use included cigarettes. He has a 41.00 pack-year smoking history. He has quit using smokeless tobacco.  His smokeless tobacco use included chew. He reports that he does not drink alcohol and does not use drugs.  No Known Allergies  Family History  Problem Relation Age of Onset  Other Father        amputation   Deep vein thrombosis Father    Heart disease Father        Atrial Fib.   Cancer Sister    Hypertension Brother      Prior to Admission medications   Medication Sig Start Date End Date Taking?  Authorizing Provider  acetaminophen-codeine (TYLENOL #3) 300-30 MG tablet Take 1 tablet by mouth every 4 (four) hours as needed for moderate pain. 10/12/20  Yes Lillard Anes, MD  aspirin EC 81 MG tablet Take 81 mg by mouth daily.   Yes [provider]  atorvastatin (LIPITOR) 80 MG tablet TAKE 1 TABLET EVERY DAY Patient taking differently: Take 80 mg by mouth daily. 07/08/20  Yes Lillard Anes, MD  clopidogrel (PLAVIX) 75 MG tablet TAKE 1 TABLET EVERY DAY Patient taking differently: Take 75 mg by mouth daily. 07/08/20  Yes Lillard Anes, MD  furosemide (LASIX) 40 MG tablet TAKE 1 TABLET EVERY DAY Patient taking differently: Take 40 mg by mouth daily. 12/29/20  Yes Richardo Priest, MD  levothyroxine (SYNTHROID) 100 MCG tablet Take 100 mcg by mouth daily. 10/14/20  Yes [provider]  losartan (COZAAR) 50 MG tablet TAKE 1 TABLET EVERY DAY Patient taking differently: Take 50 mg by mouth daily. 12/29/20  Yes Richardo Priest, MD  minocycline (MINOCIN) 50 MG capsule TAKE 1 CAPSULE EVERY 12 HOURS Patient taking differently: Take 50 mg by mouth daily. 12/27/20  Yes Lillard Anes, MD  Omega-3 Fatty Acids (FISH OIL) 1200 MG CPDR Take 1 capsule by mouth daily.   Yes [provider]  omeprazole (PRILOSEC) 40 MG capsule TAKE 1 CAPSULE EVERY DAY Patient taking differently: Take 40 mg by mouth daily. 10/12/20  Yes Lillard Anes, MD  rOPINIRole (REQUIP) 0.5 MG tablet TAKE 1 TABLET AT BEDTIME Patient taking differently: Take 0.5 mg by mouth at bedtime as needed (For restless legs). 03/10/21  Yes Lillard Anes, MD  TRELEGY ELLIPTA 100-62.5-25 MCG/ACT AEPB Take 1 puff by mouth daily. 03/22/21  Yes [provider]  levothyroxine (SYNTHROID) 125 MCG tablet Take 1 tablet (125 mcg total) by mouth daily. Patient not taking: Reported on 03/29/2021 12/17/20   Lillard Anes, MD    Physical Exam: Vitals:   03/29/21 1545 03/29/21  1600 03/29/21 1725 03/29/21 1730  BP: (!) 159/105 (!) 169/87 (!) 163/86 (!) 158/78  Pulse: 70 72 84 68  Resp: (!) 24 (!) 28 19 18   Temp:      TempSrc:      SpO2: 99% 99% 100% 100%    Constitutional: NAD, calm, comfortable Vitals:   03/29/21 1545 03/29/21 1600 03/29/21 1725 03/29/21 1730  BP: (!) 159/105 (!) 169/87 (!) 163/86 (!) 158/78  Pulse: 70 72 84 68  Resp: (!) 24 (!) 28 19 18   Temp:      TempSrc:      SpO2: 99% 99% 100% 100%   Eyes: PERRL, lids and conjunctivae normal ENMT: Mucous membranes are moist. Posterior pharynx clear of any exudate or lesions.Normal dentition.  Neck: normal, supple, no masses, no thyromegaly Respiratory: clear to auscultation bilaterally, no wheezing, no crackles. Normal respiratory effort. No accessory muscle use.  Cardiovascular: Regular rate and rhythm, no murmurs / rubs / gallops. No extremity edema. 2+ pedal pulses. No carotid bruits.  Abdomen: no tenderness, no masses palpated. No hepatosplenomegaly. Bowel sounds positive.  Musculoskeletal: no clubbing / cyanosis. No joint deformity upper and lower extremities. Good  ROM, no contractures. Normal muscle tone.  Skin: no rashes, lesions, ulcers. No induration Neurologic: CN 2-12 grossly intact. Sensation intact, DTR normal. Strength 5/5 in all 4.  Psychiatric: Normal judgment and insight. Alert and oriented x 3. Normal mood.   (  Labs on Admission: I have personally reviewed following labs and imaging studies  CBC: Recent Labs  Lab 03/29/21 1213 03/29/21 1217  WBC 6.4  --   NEUTROABS 4.3  --   HGB 10.8* 11.2*  HCT 35.2* 33.0*  MCV 88.7  --   PLT 325  --    Basic Metabolic Panel: Recent Labs  Lab 03/29/21 1213 03/29/21 1217  NA 139 139  K 4.2 5.0  CL 106 104  CO2 26  --   GLUCOSE 94 93  BUN 16 23  CREATININE 0.96 0.90  CALCIUM 9.2  --    GFR: CrCl cannot be calculated (Unknown ideal weight.). Liver Function Tests: Recent Labs  Lab 03/29/21 1213  AST 27  ALT 25   ALKPHOS 69  BILITOT 0.5  PROT 6.3*  ALBUMIN 3.4*   No results for input(s): LIPASE, AMYLASE in the last 168 hours. No results for input(s): AMMONIA in the last 168 hours. Coagulation Profile: Recent Labs  Lab 03/29/21 1213  INR 1.0   Cardiac Enzymes: No results for input(s): CKTOTAL, CKMB, CKMBINDEX, TROPONINI in the last 168 hours. BNP (last 3 results) No results for input(s): PROBNP in the last 8760 hours. HbA1C: No results for input(s): HGBA1C in the last 72 hours. CBG: No results for input(s): GLUCAP in the last 168 hours. Lipid Profile: No results for input(s): CHOL, HDL, LDLCALC, TRIG, CHOLHDL, LDLDIRECT in the last 72 hours. Thyroid Function Tests: No results for input(s): TSH, T4TOTAL, FREET4, T3FREE, THYROIDAB in the last 72 hours. Anemia Panel: No results for input(s): VITAMINB12, FOLATE, FERRITIN, TIBC, IRON, RETICCTPCT in the last 72 hours. Urine analysis:    Component Value Date/Time   COLORURINE YELLOW 03/29/2021 1518   [place] 03/29/2021 1518   LABSPEC <1.005 (L) 03/29/2021 1518   PHURINE 6.5 03/29/2021 1518   GLUCOSEU NEGATIVE 03/29/2021 1518   HGBUR NEGATIVE 03/29/2021 1518   BILIRUBINUR NEGATIVE 03/29/2021 1518   KETONESUR NEGATIVE 03/29/2021 1518   PROTEINUR NEGATIVE 03/29/2021 1518   NITRITE NEGATIVE 03/29/2021 1518   LEUKOCYTESUR NEGATIVE 03/29/2021 1518    Radiological Exams on Admission: CT Angio Head W or Wo Contrast  Result Date: 03/29/2021 CLINICAL DATA:  vision abnormality, need contrast per neuro; Vision loss, monocular Left eye vision loss EXAM: CT ANGIOGRAPHY HEAD CT VENOGRAM HEAD TECHNIQUE: Multidetector CT imaging of the head was performed using the standard protocol during bolus administration of intravenous contrast. Multiplanar CT image reconstructions and MIPs were obtained to evaluate the vascular anatomy. Multidetector CT imaging of the head was performed using the venous protocol during bolus administration of  intravenous contrast to assess the dural venous sinuses. Multiplanar CT image reconstructions and MIPs were obtained to evaluate the vascular anatomy. RADIATION DOSE REDUCTION: This exam was performed according to the departmental dose-optimization program which includes automated exposure control, adjustment of the mA and/or kV according to patient size and/or use of iterative reconstruction technique. CONTRAST:  26mL OMNIPAQUE IOHEXOL 350 MG/ML SOLN COMPARISON:  CTA Head Jul 12, 2015. FINDINGS: CT HEAD Brain: Remote infarcts in the anterior left temporal lobe, left frontal lobe, left parietal lobe and right temporal lobe. There are some additional smaller cortical infarcts in the right frontal lobe. These appear similar. Additional patchy white matter  hypoattenuation, nonspecific but compatible chronic microvascular ischemic disease. No evidence of acute large vascular territory infarct. No evidence of acute hemorrhage, mass lesion, midline shift, hydrocephalus. Vascular: See below. Skull: No acute fracture. Sinuses: Mild paranasal sinus mucosal thickening. CTA HEAD Anterior circulation: Occlusion the visualized left ICA to the carotid terminus. Left MCA is opacified, probably via patent left A1 ACA. Left MCA is small. Attenuated distal MCA vessels bilaterally in the regions of prior infarcts. The right intracranial ICA is patent with mild atherosclerotic narrowing. Right MCA and bilateral ACAs are patent without proximal high-grade stenosis. Posterior circulation: Chronically occluded right intradural vertebral artery. Left vertebral artery. Mild irregularity and narrowing of the left vertebral artery, similar. Mild narrowing of the basilar artery. Right PCA is largely supplied by the right posterior communicating artery with small or absent right P1 PCA with probable superimposed P1 PCA stenosis, similar. Left posterior communicating artery remains patent. Severe stenosis of the left P2 PCA. Also, severe  stenosis of the distal right P2 PCA. Venous sinuses: See below. Anatomic variants: Detailed above. Review of the MIP images confirms the above findings. CT VENOGRAM HEAD No evidence of dural venous sinus thrombosis. The superior sagittal sinus IMPRESSION: CT head: 1. Multiple bilateral remote infarcts, which appears similar by CT. No definite new/interval acute infarcts; however, MRI could provide more sensitive evaluation for acute infarct if clinically indicated. 2. Chronic microvascular ischemic disease. CTA head: 1. Age indeterminate occlusion of the left ICA with reconstitution at the carotid terminus via patent left A1 ACA. Left MCA is small, but opacified. Attenuated distal MCA vessels in regions of prior infarcts bilaterally. 2. Chronically occluded right vertebral artery. 3. Small/hypoplastic right P1 PCA with suspected superimposed severe stenosis. Also, severe stenosis of bilateral P2 PCAs. CT venogram: No evidence of dural venous sinus thrombosis. Findings discussed with Dr. Rogene Houston via telephone at 2:50 p.m. Electronically Signed   By: Margaretha Sheffield M.D.   On: 03/29/2021 15:12   MR BRAIN WO CONTRAST  Result Date: 03/29/2021 CLINICAL DATA:  Vision loss in left eye since Saturday, retinal vein occlusion diagnosed at the eye doctor, history of strokes EXAM: MRI HEAD WITHOUT CONTRAST TECHNIQUE: Multiplanar, multiecho pulse sequences of the brain and surrounding structures were obtained without intravenous contrast. COMPARISON:  Brain MRI 11/24/2014, same-day CTA and CT venogram head FINDINGS: Brain: There is no evidence of acute intracranial hemorrhage, extra-axial fluid collection, or acute infarct. There is mild global parenchymal volume loss with prominence of the ventricular system and extra-axial CSF spaces. Remote infarcts are seen in the left anterior temporal lobe, left insula and frontal lobe, left parietal lobe, and right temporal lobe. A small remote infarct is seen in the pons.  Additional foci of FLAIR signal abnormality in the subcortical and periventricular white matter likely reflects sequela of mild chronic white matter microangiopathy. There is no solid mass lesion.  There is no midline shift. Vascular: The left ICA flow void is absent, as seen on prior CTA. The left MCA flow void is present. The right V4 segment flow void is not identified. The other major flow voids are present. Skull and upper cervical spine: Normal marrow signal. Sinuses/Orbits: Paranasal sinuses are clear. Bilateral lens implants are in place. The globes and orbits are otherwise unremarkable. Other: There is a right mastoid effusion. IMPRESSION: 1. No acute intracranial pathology. 2. Multiple remote infarcts in the bilateral cerebral hemispheres as above. Electronically Signed   By: Valetta Mole M.D.   On: 03/29/2021 18:43   CT VENOGRAM HEAD  Result Date: 03/29/2021 CLINICAL DATA:  vision abnormality, need contrast per neuro; Vision loss, monocular Left eye vision loss EXAM: CT ANGIOGRAPHY HEAD CT VENOGRAM HEAD TECHNIQUE: Multidetector CT imaging of the head was performed using the standard protocol during bolus administration of intravenous contrast. Multiplanar CT image reconstructions and MIPs were obtained to evaluate the vascular anatomy. Multidetector CT imaging of the head was performed using the venous protocol during bolus administration of intravenous contrast to assess the dural venous sinuses. Multiplanar CT image reconstructions and MIPs were obtained to evaluate the vascular anatomy. RADIATION DOSE REDUCTION: This exam was performed according to the departmental dose-optimization program which includes automated exposure control, adjustment of the mA and/or kV according to patient size and/or use of iterative reconstruction technique. CONTRAST:  45mL OMNIPAQUE IOHEXOL 350 MG/ML SOLN COMPARISON:  CTA Head Jul 12, 2015. FINDINGS: CT HEAD Brain: Remote infarcts in the anterior left temporal lobe,  left frontal lobe, left parietal lobe and right temporal lobe. There are some additional smaller cortical infarcts in the right frontal lobe. These appear similar. Additional patchy white matter hypoattenuation, nonspecific but compatible chronic microvascular ischemic disease. No evidence of acute large vascular territory infarct. No evidence of acute hemorrhage, mass lesion, midline shift, hydrocephalus. Vascular: See below. Skull: No acute fracture. Sinuses: Mild paranasal sinus mucosal thickening. CTA HEAD Anterior circulation: Occlusion the visualized left ICA to the carotid terminus. Left MCA is opacified, probably via patent left A1 ACA. Left MCA is small. Attenuated distal MCA vessels bilaterally in the regions of prior infarcts. The right intracranial ICA is patent with mild atherosclerotic narrowing. Right MCA and bilateral ACAs are patent without proximal high-grade stenosis. Posterior circulation: Chronically occluded right intradural vertebral artery. Left vertebral artery. Mild irregularity and narrowing of the left vertebral artery, similar. Mild narrowing of the basilar artery. Right PCA is largely supplied by the right posterior communicating artery with small or absent right P1 PCA with probable superimposed P1 PCA stenosis, similar. Left posterior communicating artery remains patent. Severe stenosis of the left P2 PCA. Also, severe stenosis of the distal right P2 PCA. Venous sinuses: See below. Anatomic variants: Detailed above. Review of the MIP images confirms the above findings. CT VENOGRAM HEAD No evidence of dural venous sinus thrombosis. The superior sagittal sinus IMPRESSION: CT head: 1. Multiple bilateral remote infarcts, which appears similar by CT. No definite new/interval acute infarcts; however, MRI could provide more sensitive evaluation for acute infarct if clinically indicated. 2. Chronic microvascular ischemic disease. CTA head: 1. Age indeterminate occlusion of the left ICA with  reconstitution at the carotid terminus via patent left A1 ACA. Left MCA is small, but opacified. Attenuated distal MCA vessels in regions of prior infarcts bilaterally. 2. Chronically occluded right vertebral artery. 3. Small/hypoplastic right P1 PCA with suspected superimposed severe stenosis. Also, severe stenosis of bilateral P2 PCAs. CT venogram: No evidence of dural venous sinus thrombosis. Findings discussed with Dr. Rogene Houston via telephone at 2:50 p.m. Electronically Signed   By: Margaretha Sheffield M.D.   On: 03/29/2021 15:12    EKG: Independently reviewed.  Sinus rhythm, first-degree AV block.  Assessment/Plan Principal Problem:   Vision exam with abnormal findings concern for left retinal artery occlusion  (please populate well all problems here in Problem List. (For example, if patient is on BP meds at home and you resume or decide to hold them, it is a problem that needs to be her. Same for CAD, COPD, HLD and so on)  Amaurosis fugax of left eye -Improving,  rule out stroke.  Other DDx, left retinal artery thrombosis.  Out-of-windowl for tPA. -Continue duo antiplatelet.  Considering also the patient has had multiple TIAs recently, but given his history of intracranial hemorrhage, is probably a poor candidate for systemic anticoagulation.   HTN -Symptoms onset more than 2 days ago, although controlled for permissive hypertension, resume home BP meds.  Multiple chronic intracranial arteries stenosis -Patient already on aspirin, Plavix and high-dose of atorvastatin.  Chronic diastolic CHF -No symptoms or signs of acute decompensation, continue Lasix.  Hypothyroidism -Continue Synthroid  DVT prophylaxis: Lovenox Code Status: Full code Family Communication: None at bedside Disposition Plan: Expect less than 2 midnight hospital stay Consults called: Neurology Admission status: Telemetry observation   Lequita Halt MD [facility] Pager [phone]  03/29/2021, 6:48 PM

## 2021-03-29 NOTE — ED Notes (Signed)
Pt in CT.

## 2021-03-29 NOTE — Consult Note (Addendum)
Neurology Consult  James Richards MR# [id number] 03/29/2021   CC: Patient lost vision in left eye on Saturday night around 2200 and seen by ophthalmologist today and sent to ER with concern for left retinal occusion branch artery eye.   History is obtained from: Patient and chart.  HPI: James Richards is a 76 y.o. 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 He developed loss of vision in left eye 03/27/21 and visited his ophthalmologist today. Optho had concern for left retinal occlusion branch artery of eye and referred pt to ED. Of note, patient's eyes were not dilated by optho. Neurology was consulted for further eval.   ROS: A complete 12 ROS was performed and is negative except as noted in the HPI.    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   History of recurrent TIAs 12/11/2014   Impacted cerumen 11/26/2019   Malignant neoplasm of base of tongue ([place]) 01/21/2015   Malignant neoplasm of larynx, unspecified ([facility])    Migraine 04/02/2014   Mixed hyperlipidemia    Other fatigue    Other specified hypothyroidism    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    Restless legs syndrome    Restrictive lung disease 11/26/2019   TIA (transient ischemic attack) 11/24/2014     Family History  Problem Relation Age of Onset   Other Father        amputation   Deep vein thrombosis Father    Heart disease Father        Atrial Fib.   Cancer Sister    Hypertension Brother     Social History:  reports that he quit smoking about 14  years ago. His smoking use included cigarettes. He has a 41.00 pack-year smoking history. He has quit using smokeless tobacco.  His smokeless tobacco use included chew. He reports that he does not drink alcohol and does not use drugs.   Prior to Admission medications   Medication Sig Start Date End Date Taking? Authorizing Provider  acetaminophen-codeine (TYLENOL #3) 300-30 MG tablet Take 1 tablet by mouth every 4 (four) hours as needed for moderate pain. 10/12/20  Yes Lillard Anes, MD  aspirin EC 81 MG tablet Take 81 mg by mouth daily.   Yes [provider]  atorvastatin (LIPITOR) 80 MG tablet TAKE 1 TABLET EVERY DAY Patient taking differently: Take 80 mg by mouth daily. 07/08/20  Yes Lillard Anes, MD  clopidogrel (PLAVIX) 75 MG tablet TAKE 1 TABLET EVERY DAY Patient taking differently: Take 75 mg by mouth daily. 07/08/20  Yes Lillard Anes, MD  furosemide (LASIX) 40 MG tablet TAKE 1 TABLET EVERY DAY Patient taking differently: Take 40 mg by mouth daily. 12/29/20  Yes Richardo Priest, MD  levothyroxine (SYNTHROID) 100 MCG tablet Take 100 mcg by mouth daily. 10/14/20  Yes [provider]  losartan (COZAAR) 50 MG tablet TAKE 1 TABLET EVERY DAY Patient taking differently: Take 50 mg by mouth daily. 12/29/20  Yes Richardo Priest, MD  minocycline (MINOCIN) 50 MG capsule TAKE 1 CAPSULE EVERY 12 HOURS Patient taking differently: Take 50  mg by mouth daily. 12/27/20  Yes Lillard Anes, MD  Omega-3 Fatty Acids (FISH OIL) 1200 MG CPDR Take 1 capsule by mouth daily.   Yes [provider]  omeprazole (PRILOSEC) 40 MG capsule TAKE 1 CAPSULE EVERY DAY Patient taking differently: Take 40 mg by mouth daily. 10/12/20  Yes Lillard Anes, MD  rOPINIRole (REQUIP) 0.5 MG tablet TAKE 1 TABLET AT BEDTIME Patient taking differently: Take 0.5 mg by mouth at bedtime as needed (For restless legs). 03/10/21  Yes Lillard Anes, MD  TRELEGY  ELLIPTA 100-62.5-25 MCG/ACT AEPB Take 1 puff by mouth daily. 03/22/21  Yes [provider]  levothyroxine (SYNTHROID) 125 MCG tablet Take 1 tablet (125 mcg total) by mouth daily. Patient not taking: Reported on 03/29/2021 12/17/20   Lillard Anes, MD    Exam: Current vital signs: BP (!) 169/87    Pulse 72    Temp 98.2 F (36.8 C) (Oral)    Resp (!) 28    SpO2 99%   Physical Exam  Constitutional: Appears well-developed and well-nourished.  Psych: Affect appropriate to situation Eyes: No scleral injection HENT: No OP obstruction. Head: Normocephalic.  Cardiovascular: Normal rate and regular rhythm.  Respiratory: Effort normal, symmetric excursions bilaterally, no audible wheezing. GI: Soft.  No distension. There is no tenderness.  Skin: WDI  Neuro: Mental Status: Patient is awake, alert, oriented to person, place, month, year, and situation. Patient is able to give a clear and coherent history. Speech fluent, intact comprehension and repetition. ( Dentures cause a change in speech that is not dysarthria) No signs of aphasia or neglect. Pupils are equal, round, and reactive to light. Right eye vision full and left visual field testing positive for superior hemianopsia (top half) EOMI without ptosis or diploplia.  Facial sensation is symmetric to temperature Facial movement is symmetric.  Hearing is intact to voice. Uvula midline and palate elevates symmetrically. Shoulder shrug is symmetric. Tongue is midline without atrophy or fasciculations.  Tone is normal. Bulk is normal. 5/5 strength was present in all four extremities. Sensation is symmetric to light touch and temperature in the arms and legs. Deep Tendon Reflexes: 2+ and symmetric in the biceps and patellae. Toes are downgoing bilaterally. FNF and HKS are intact bilaterally. Gait - Deferred  I have reviewed labs in epic and the pertinent results are: UA- no concern for UTI, H&H 11.2& 35.2,   I have  reviewed the images obtained: CT Venogram head showed no evidence of dural venous sinus thrombosis. CT head showed multiple bilateral remote infarcts, which appears similar by CT.No definite new/interval acute infarcts.Chronic microvascular ischemic disease. CTA head showed an age indeterminate occlusion of the left ICA with reconstitution at the carotid terminus via patent left A1 ACA. Left MCA is small,but opacified. Attenuated distal MCA vessels in regions of prior infarcts bilaterally.Chronically occluded right vertebral artery.Small/hypoplastic right P1 PCA with suspected superimposed severe stenosis. Also, severe stenosis of bilateral P2 PCAs.    Assessment: James Richards is a 76 y.o. male PMHx to include recurrent TIAs and strokes, HLD, malignant neoplasm of base tongue and larynx, HTN and carotid stenosis. Seen in ED and alert and oriented x 3. No neuro deficits except Left superior hemianopsia noted on exam and no other neuro deficits. Denies pain or HA. He endorses improvement since Saturday when he could not see out of left eye at all and now can see out of bottom of eye and states he sees a halo or light above  the dark area. He says some holes are coming through so he can see bits of light through the area that was totally dark    Impression:  Left superior hemianopsia improving. Concern for left retinal artery occlusion.   Plan: -Stroke workup - MRI brain without contrast - Echo. - Continue home Lipitor 80mg   -  Aspirin 325mg mg now. - Continue home Plavix 75mg  daily - serum labs: A1C and Lipid panel - Permissive hypertension first 24 h < 220/110.  - Telemetry monitoring for arrhythmia. - Recommend OT consult. -Stroke team to follow tomorrow.    Electronically signed by:  Parke Poisson, Neuro NP Pager [phone] 03/29/2021, 4:39 PM  ATTENDING ATTESTATION:  Monoocular   Dr. Reeves Forth evaluated pt independently, reviewed imaging, chart, labs. Discussed and formulated plan  with the APP. Please see APP note above for details.     MDM: high. This patient is ill with concern for retinal artery infarction and at significant risk of neurological worsening, and care requires constant monitoring of vital signs,hemodynamics, respiratory and cardiac monitoring, neurological assessment, discussion with family, other specialists and medical decision making of high complexity.   Doha Boling,MD   If 7pm- 7am, please page neurology on call as listed in [place].

## 2021-03-29 NOTE — ED Provider Notes (Signed)
[facility] EMERGENCY DEPARTMENT Provider Note   CSN: [id number] Arrival date & time: 03/29/21  1144     History  Chief Complaint  Patient presents with   Loss of Vision    James Richards is a 76 y.o. male.  Is a 76 year old male with a history of hypertension, hyperlipidemia, restless leg syndrome, recurrent TIAs, laryngeal malignancy status postresection and completed chemotherapy, carotid stenosis, diastolic dysfunction who is presenting today after seeing ophthalmology due to a left retinal vein occlusion.  Patient reports that on Saturday 10 PM he had sudden painless loss of vision in his left eye but reported prior to going to bed he was starting to get some vision back.  When he woke up yesterday morning he reports the top third of his vision is blurry but the rest seems normal.  He continues to deny any eye pain, headache.  His wife reports he has not had any change in his speech and he denies any unilateral numbness or weakness.  He has had no difficulty with his gait.  He saw his ophthalmologist today because he thought there was a problem with his cataract and they reported that he had a retinal vein occlusion and sent him here for further care.  Patient's wife reports he has been taking all of his medications as prescribed and does report that he had been on a full-strength aspirin but approximately 1 year ago was decreased to an 81 mg aspirin but continues to do fish oil and cholesterol medicine.  He denies any recent illnesses and reports he is otherwise in his normal state of health.  The history is provided by the patient, the spouse and medical records.      Home Medications Prior to Admission medications   Medication Sig Start Date End Date Taking? Authorizing Provider  acetaminophen-codeine (TYLENOL #3) 300-30 MG tablet Take 1 tablet by mouth every 4 (four) hours as needed for moderate pain. 10/12/20  Yes Lillard Anes, MD  aspirin EC 81 MG  tablet Take 81 mg by mouth daily.   Yes [provider]  atorvastatin (LIPITOR) 80 MG tablet TAKE 1 TABLET EVERY DAY Patient taking differently: Take 80 mg by mouth daily. 07/08/20  Yes Lillard Anes, MD  clopidogrel (PLAVIX) 75 MG tablet TAKE 1 TABLET EVERY DAY Patient taking differently: Take 75 mg by mouth daily. 07/08/20  Yes Lillard Anes, MD  furosemide (LASIX) 40 MG tablet TAKE 1 TABLET EVERY DAY Patient taking differently: Take 40 mg by mouth daily. 12/29/20  Yes Richardo Priest, MD  levothyroxine (SYNTHROID) 100 MCG tablet Take 100 mcg by mouth daily. 10/14/20  Yes [provider]  losartan (COZAAR) 50 MG tablet TAKE 1 TABLET EVERY DAY Patient taking differently: Take 50 mg by mouth daily. 12/29/20  Yes Richardo Priest, MD  minocycline (MINOCIN) 50 MG capsule TAKE 1 CAPSULE EVERY 12 HOURS Patient taking differently: Take 50 mg by mouth daily. 12/27/20  Yes Lillard Anes, MD  Omega-3 Fatty Acids (FISH OIL) 1200 MG CPDR Take 1 capsule by mouth daily.   Yes [provider]  omeprazole (PRILOSEC) 40 MG capsule TAKE 1 CAPSULE EVERY DAY Patient taking differently: Take 40 mg by mouth daily. 10/12/20  Yes Lillard Anes, MD  rOPINIRole (REQUIP) 0.5 MG tablet TAKE 1 TABLET AT BEDTIME Patient taking differently: Take 0.5 mg by mouth at bedtime as needed (For restless legs). 03/10/21  Yes Lillard Anes, MD  TRELEGY ELLIPTA 100-62.5-25  MCG/ACT AEPB Take 1 puff by mouth daily. 03/22/21  Yes [provider]  levothyroxine (SYNTHROID) 125 MCG tablet Take 1 tablet (125 mcg total) by mouth daily. Patient not taking: Reported on 03/29/2021 12/17/20   Lillard Anes, MD      Allergies    Patient has no known allergies.    Review of Systems   Review of Systems  Physical Exam Updated Vital Signs BP (!) 169/87    Pulse 72    Temp 98.2 F (36.8 C) (Oral)    Resp (!) 28    SpO2 99%  Physical Exam Vitals and nursing note  reviewed.  Constitutional:      General: He is not in acute distress.    Appearance: He is well-developed. He is ill-appearing.     Comments: Chronically ill-appearing  HENT:     Head: Normocephalic and atraumatic.     Mouth/Throat:     Mouth: Mucous membranes are moist.  Eyes:     General: No visual field deficit.    Conjunctiva/sclera: Conjunctivae normal.     Pupils: Pupils are equal, round, and reactive to light.     Comments: Pupil is no longer dilated and pupils are 2 mm and reactive  Neck:     Comments: Severe kyphosis Cardiovascular:     Rate and Rhythm: Normal rate and regular rhythm.     Heart sounds: No murmur heard. Pulmonary:     Effort: Pulmonary effort is normal. No respiratory distress.     Breath sounds: Normal breath sounds. No wheezing or rales.  Abdominal:     General: There is no distension.     Palpations: Abdomen is soft.     Tenderness: There is no abdominal tenderness. There is no guarding or rebound.  Musculoskeletal:        General: No tenderness. Normal range of motion.     Cervical back: Normal range of motion and neck supple.     Right lower leg: No edema.     Left lower leg: No edema.  Skin:    General: Skin is warm and dry.     Findings: No erythema or rash.  Neurological:     Mental Status: He is alert and oriented to person, place, and time.     Cranial Nerves: No cranial nerve deficit or facial asymmetry.     Sensory: No sensory deficit.     Motor: No weakness.     Gait: Gait normal.     Comments: Reports blurriness in the left upper visual fields but visual fields are intact  Psychiatric:        Mood and Affect: Mood normal.        Behavior: Behavior normal.    ED Results / Procedures / Treatments   Labs (all labs ordered are listed, but only abnormal results are displayed) Labs Reviewed  CBC WITH DIFFERENTIAL/PLATELET - Abnormal; Notable for the following components:      Result Value   RBC 3.97 (*)    Hemoglobin 10.8 (*)     HCT 35.2 (*)    RDW 15.8 (*)    All other components within normal limits  COMPREHENSIVE METABOLIC PANEL - Abnormal; Notable for the following components:   Total Protein 6.3 (*)    Albumin 3.4 (*)    All other components within normal limits  URINALYSIS, ROUTINE W REFLEX MICROSCOPIC - Abnormal; Notable for the following components:   Specific Gravity, Urine <1.005 (*)    All other components  within normal limits  I-STAT CHEM 8, ED - Abnormal; Notable for the following components:   Hemoglobin 11.2 (*)    HCT 33.0 (*)    All other components within normal limits  RESP PANEL BY RT-PCR (FLU A&B, COVID) ARPGX2  ETHANOL  PROTIME-INR  APTT  RAPID URINE DRUG SCREEN, HOSP PERFORMED  HEMOGLOBIN A1C  LIPID PANEL    EKG EKG Interpretation  Date/Time:  Monday March 29 2021 12:05:06 EST Ventricular Rate:  74 PR Interval:  216 QRS Duration: 68 QT Interval:  388 QTC Calculation: 430 R Axis:   -21 Text Interpretation: Sinus rhythm with 1st degree A-V block with Premature atrial complexes Inferior infarct , age undetermined Anterolateral infarct , age undetermined No previous ECGs available Confirmed by Blanchie Dessert [phone]) on 03/29/2021 3:07:21 PM  Radiology CT Angio Head W or Wo Contrast  Result Date: 03/29/2021 CLINICAL DATA:  vision abnormality, need contrast per neuro; Vision loss, monocular Left eye vision loss EXAM: CT ANGIOGRAPHY HEAD CT VENOGRAM HEAD TECHNIQUE: Multidetector CT imaging of the head was performed using the standard protocol during bolus administration of intravenous contrast. Multiplanar CT image reconstructions and MIPs were obtained to evaluate the vascular anatomy. Multidetector CT imaging of the head was performed using the venous protocol during bolus administration of intravenous contrast to assess the dural venous sinuses. Multiplanar CT image reconstructions and MIPs were obtained to evaluate the vascular anatomy. RADIATION DOSE REDUCTION: This exam was  performed according to the departmental dose-optimization program which includes automated exposure control, adjustment of the mA and/or kV according to patient size and/or use of iterative reconstruction technique. CONTRAST:  97mL OMNIPAQUE IOHEXOL 350 MG/ML SOLN COMPARISON:  CTA Head Jul 12, 2015. FINDINGS: CT HEAD Brain: Remote infarcts in the anterior left temporal lobe, left frontal lobe, left parietal lobe and right temporal lobe. There are some additional smaller cortical infarcts in the right frontal lobe. These appear similar. Additional patchy white matter hypoattenuation, nonspecific but compatible chronic microvascular ischemic disease. No evidence of acute large vascular territory infarct. No evidence of acute hemorrhage, mass lesion, midline shift, hydrocephalus. Vascular: See below. Skull: No acute fracture. Sinuses: Mild paranasal sinus mucosal thickening. CTA HEAD Anterior circulation: Occlusion the visualized left ICA to the carotid terminus. Left MCA is opacified, probably via patent left A1 ACA. Left MCA is small. Attenuated distal MCA vessels bilaterally in the regions of prior infarcts. The right intracranial ICA is patent with mild atherosclerotic narrowing. Right MCA and bilateral ACAs are patent without proximal high-grade stenosis. Posterior circulation: Chronically occluded right intradural vertebral artery. Left vertebral artery. Mild irregularity and narrowing of the left vertebral artery, similar. Mild narrowing of the basilar artery. Right PCA is largely supplied by the right posterior communicating artery with small or absent right P1 PCA with probable superimposed P1 PCA stenosis, similar. Left posterior communicating artery remains patent. Severe stenosis of the left P2 PCA. Also, severe stenosis of the distal right P2 PCA. Venous sinuses: See below. Anatomic variants: Detailed above. Review of the MIP images confirms the above findings. CT VENOGRAM HEAD No evidence of dural venous  sinus thrombosis. The superior sagittal sinus IMPRESSION: CT head: 1. Multiple bilateral remote infarcts, which appears similar by CT. No definite new/interval acute infarcts; however, MRI could provide more sensitive evaluation for acute infarct if clinically indicated. 2. Chronic microvascular ischemic disease. CTA head: 1. Age indeterminate occlusion of the left ICA with reconstitution at the carotid terminus via patent left A1 ACA. Left MCA is small, but opacified.  Attenuated distal MCA vessels in regions of prior infarcts bilaterally. 2. Chronically occluded right vertebral artery. 3. Small/hypoplastic right P1 PCA with suspected superimposed severe stenosis. Also, severe stenosis of bilateral P2 PCAs. CT venogram: No evidence of dural venous sinus thrombosis. Findings discussed with Dr. Rogene Houston via telephone at 2:50 p.m. Electronically Signed   By: Margaretha Sheffield M.D.   On: 03/29/2021 15:12   CT VENOGRAM HEAD  Result Date: 03/29/2021 CLINICAL DATA:  vision abnormality, need contrast per neuro; Vision loss, monocular Left eye vision loss EXAM: CT ANGIOGRAPHY HEAD CT VENOGRAM HEAD TECHNIQUE: Multidetector CT imaging of the head was performed using the standard protocol during bolus administration of intravenous contrast. Multiplanar CT image reconstructions and MIPs were obtained to evaluate the vascular anatomy. Multidetector CT imaging of the head was performed using the venous protocol during bolus administration of intravenous contrast to assess the dural venous sinuses. Multiplanar CT image reconstructions and MIPs were obtained to evaluate the vascular anatomy. RADIATION DOSE REDUCTION: This exam was performed according to the departmental dose-optimization program which includes automated exposure control, adjustment of the mA and/or kV according to patient size and/or use of iterative reconstruction technique. CONTRAST:  4mL OMNIPAQUE IOHEXOL 350 MG/ML SOLN COMPARISON:  CTA Head Jul 12, 2015.  FINDINGS: CT HEAD Brain: Remote infarcts in the anterior left temporal lobe, left frontal lobe, left parietal lobe and right temporal lobe. There are some additional smaller cortical infarcts in the right frontal lobe. These appear similar. Additional patchy white matter hypoattenuation, nonspecific but compatible chronic microvascular ischemic disease. No evidence of acute large vascular territory infarct. No evidence of acute hemorrhage, mass lesion, midline shift, hydrocephalus. Vascular: See below. Skull: No acute fracture. Sinuses: Mild paranasal sinus mucosal thickening. CTA HEAD Anterior circulation: Occlusion the visualized left ICA to the carotid terminus. Left MCA is opacified, probably via patent left A1 ACA. Left MCA is small. Attenuated distal MCA vessels bilaterally in the regions of prior infarcts. The right intracranial ICA is patent with mild atherosclerotic narrowing. Right MCA and bilateral ACAs are patent without proximal high-grade stenosis. Posterior circulation: Chronically occluded right intradural vertebral artery. Left vertebral artery. Mild irregularity and narrowing of the left vertebral artery, similar. Mild narrowing of the basilar artery. Right PCA is largely supplied by the right posterior communicating artery with small or absent right P1 PCA with probable superimposed P1 PCA stenosis, similar. Left posterior communicating artery remains patent. Severe stenosis of the left P2 PCA. Also, severe stenosis of the distal right P2 PCA. Venous sinuses: See below. Anatomic variants: Detailed above. Review of the MIP images confirms the above findings. CT VENOGRAM HEAD No evidence of dural venous sinus thrombosis. The superior sagittal sinus IMPRESSION: CT head: 1. Multiple bilateral remote infarcts, which appears similar by CT. No definite new/interval acute infarcts; however, MRI could provide more sensitive evaluation for acute infarct if clinically indicated. 2. Chronic microvascular  ischemic disease. CTA head: 1. Age indeterminate occlusion of the left ICA with reconstitution at the carotid terminus via patent left A1 ACA. Left MCA is small, but opacified. Attenuated distal MCA vessels in regions of prior infarcts bilaterally. 2. Chronically occluded right vertebral artery. 3. Small/hypoplastic right P1 PCA with suspected superimposed severe stenosis. Also, severe stenosis of bilateral P2 PCAs. CT venogram: No evidence of dural venous sinus thrombosis. Findings discussed with Dr. Rogene Houston via telephone at 2:50 p.m. Electronically Signed   By: Margaretha Sheffield M.D.   On: 03/29/2021 15:12    Procedures Procedures    Medications  Ordered in ED Medications  aspirin EC tablet 81 mg (has no administration in time range)  atorvastatin (LIPITOR) tablet 80 mg (has no administration in time range)  clopidogrel (PLAVIX) tablet 75 mg (has no administration in time range)  levothyroxine (SYNTHROID) tablet 100 mcg (has no administration in time range)  Fish Oil CPDR 1 capsule (has no administration in time range)  rOPINIRole (REQUIP) tablet 0.5 mg (has no administration in time range)  pantoprazole (PROTONIX) EC tablet 40 mg (has no administration in time range)  rOPINIRole (REQUIP) tablet 0.5 mg (has no administration in time range)  iohexol (OMNIPAQUE) 350 MG/ML injection 80 mL (80 mLs Intravenous Contrast Given 03/29/21 1427)    ED Course/ Medical Decision Making/ A&P                           Medical Decision Making  Patient is a 76 year old male with multiple comorbidities and prior strokes who is presenting today with visual field loss.  He reports now it is just blurry vision in the upper third of his left eye.  His visual fields are intact.  He is in no acute distress at this time and denies any pain.  I independently interpreted patient's EKG, and labs.  His EKG shows a sinus rhythm with occasional PACs which is unchanged.  His labs show normal PT/INR, normal CMP, negative  COVID, CBC with mild anemia of 10.8 but no acute findings.  UA without acute findings.  CT venogram and CTA of the brain showed age-indeterminate occlusion of the left ICA with reconstruction at the carotid terminus, left MCA was small and opacified and he has a chronically occluded right vertebral artery.  There is also small hypoplastic right P1 PCA with suspected severe stenosis.  No evidence of dural venous thrombosis.  Findings were discussed with the patient and his wife.  Discussed the case with neurology given the complex nature of the patient's comorbidities and imaging.  Neurology recommended admission for stroke work-up.  Patient will need an MRI.  Spoke to the hospitalist service for admission.  At this time we will continue 81 mg aspirin until neurology recommends differently.  Patient remained on continuous cardiac monitoring without any evidence of dysrhythmia.        Final Clinical Impression(s) / ED Diagnoses Final diagnoses:  Vision loss of left eye    Rx / DC Orders ED Discharge Orders     None         Blanchie Dessert, MD 03/29/21 1725

## 2021-03-29 NOTE — ED Notes (Signed)
Patient transported to MRI 

## 2021-03-29 NOTE — ED Triage Notes (Signed)
Patient complains of stroke to left eye after being sent from ophthalmology. Patient lost vision in left eye on Saturday night and now has partially returned and denies pain

## 2021-03-30 ENCOUNTER — Observation Stay (HOSPITAL_BASED_OUTPATIENT_CLINIC_OR_DEPARTMENT_OTHER): Payer: Medicare PPO

## 2021-03-30 DIAGNOSIS — I110 Hypertensive heart disease with heart failure: Secondary | ICD-10-CM | POA: Diagnosis not present

## 2021-03-30 DIAGNOSIS — I5032 Chronic diastolic (congestive) heart failure: Secondary | ICD-10-CM | POA: Diagnosis not present

## 2021-03-30 DIAGNOSIS — Z0101 Encounter for examination of eyes and vision with abnormal findings: Secondary | ICD-10-CM

## 2021-03-30 DIAGNOSIS — D649 Anemia, unspecified: Secondary | ICD-10-CM

## 2021-03-30 DIAGNOSIS — I669 Occlusion and stenosis of unspecified cerebral artery: Secondary | ICD-10-CM | POA: Diagnosis not present

## 2021-03-30 DIAGNOSIS — Z6831 Body mass index (BMI) 31.0-31.9, adult: Secondary | ICD-10-CM

## 2021-03-30 DIAGNOSIS — E669 Obesity, unspecified: Secondary | ICD-10-CM

## 2021-03-30 DIAGNOSIS — H3412 Central retinal artery occlusion, left eye: Secondary | ICD-10-CM | POA: Diagnosis not present

## 2021-03-30 DIAGNOSIS — E039 Hypothyroidism, unspecified: Secondary | ICD-10-CM | POA: Diagnosis not present

## 2021-03-30 DIAGNOSIS — I6522 Occlusion and stenosis of left carotid artery: Secondary | ICD-10-CM

## 2021-03-30 LAB — CBC WITH DIFFERENTIAL/PLATELET
Abs Immature Granulocytes: 0.01 10*3/uL (ref 0.00–0.07)
Basophils Absolute: 0.1 10*3/uL (ref 0.0–0.1)
Basophils Relative: 1 %
Eosinophils Absolute: 0.2 10*3/uL (ref 0.0–0.5)
Eosinophils Relative: 2 %
HCT: 32.8 % — ABNORMAL LOW (ref 39.0–52.0)
Hemoglobin: 10.1 g/dL — ABNORMAL LOW (ref 13.0–17.0)
Immature Granulocytes: 0 %
Lymphocytes Relative: 14 %
Lymphs Abs: 1 10*3/uL (ref 0.7–4.0)
MCH: 26.9 pg (ref 26.0–34.0)
MCHC: 30.8 g/dL (ref 30.0–36.0)
MCV: 87.2 fL (ref 80.0–100.0)
Monocytes Absolute: 0.7 10*3/uL (ref 0.1–1.0)
Monocytes Relative: 10 %
Neutro Abs: 5 10*3/uL (ref 1.7–7.7)
Neutrophils Relative %: 73 %
Platelets: 317 10*3/uL (ref 150–400)
RBC: 3.76 MIL/uL — ABNORMAL LOW (ref 4.22–5.81)
RDW: 15.8 % — ABNORMAL HIGH (ref 11.5–15.5)
WBC: 7 10*3/uL (ref 4.0–10.5)
nRBC: 0 % (ref 0.0–0.2)

## 2021-03-30 LAB — ECHOCARDIOGRAM COMPLETE
AR max vel: 2.18 cm2
AV Area VTI: 2.11 cm2
AV Area mean vel: 1.97 cm2
AV Mean grad: 6 mmHg
AV Peak grad: 10.2 mmHg
Ao pk vel: 1.6 m/s
Area-P 1/2: 3.39 cm2
Height: 65 in
S' Lateral: 2.4 cm
Weight: 3033.6 oz

## 2021-03-30 LAB — LIPID PANEL
Cholesterol: 99 mg/dL (ref 0–200)
HDL: 41 mg/dL (ref >40)
LDL Cholesterol: 47 mg/dL (ref 0–99)
Total CHOL/HDL Ratio: 2.4 RATIO
Triglycerides: 57 mg/dL (ref <150)
VLDL: 11 mg/dL (ref 0–40)

## 2021-03-30 LAB — COMPREHENSIVE METABOLIC PANEL
ALT: 25 U/L (ref 0–44)
AST: 28 U/L (ref 15–41)
Albumin: 3.4 g/dL — ABNORMAL LOW (ref 3.5–5.0)
Alkaline Phosphatase: 66 U/L (ref 38–126)
Anion gap: 11 (ref 5–15)
BUN: 16 mg/dL (ref 8–23)
CO2: 25 mmol/L (ref 22–32)
Calcium: 9.9 mg/dL (ref 8.9–10.3)
Chloride: 103 mmol/L (ref 98–111)
Creatinine, Ser: 0.99 mg/dL (ref 0.61–1.24)
GFR, Estimated: >60 mL/min (ref >60)
Glucose, Bld: 81 mg/dL (ref 70–99)
Potassium: 3.8 mmol/L (ref 3.5–5.1)
Sodium: 139 mmol/L (ref 135–145)
Total Bilirubin: 0.4 mg/dL (ref 0.3–1.2)
Total Protein: 6.2 g/dL — ABNORMAL LOW (ref 6.5–8.1)

## 2021-03-30 LAB — HEMOGLOBIN A1C
Hgb A1c MFr Bld: 5.8 % — ABNORMAL HIGH (ref 4.8–5.6)
Mean Plasma Glucose: 119.76 mg/dL

## 2021-03-30 LAB — MAGNESIUM: Magnesium: 1.8 mg/dL (ref 1.7–2.4)

## 2021-03-30 LAB — PHOSPHORUS: Phosphorus: 3.7 mg/dL (ref 2.5–4.6)

## 2021-03-30 MED ORDER — TICAGRELOR 90 MG PO TABS: 90.0000 mg | ORAL_TABLET | Freq: Two times a day (BID) | ORAL | 2 refills | Status: DC

## 2021-03-30 MED ORDER — SENNOSIDES-DOCUSATE SODIUM 8.6-50 MG PO TABS: 1.0000 | ORAL_TABLET | Freq: Every evening | ORAL | 0 refills | Status: DC | PRN

## 2021-03-30 MED ORDER — TICAGRELOR 90 MG PO TABS: 90.0000 mg | ORAL_TABLET | Freq: Two times a day (BID) | ORAL | Status: DC

## 2021-03-30 NOTE — Progress Notes (Signed)
PT Cancellation Note  Patient Details Name: James Richards MRN: [id number] DOB: 1945-08-26   Cancelled Treatment:    Reason Eval/Treat Not Completed: Patient at procedure or test/unavailable - Pt. Receiving an echo. PT will check back later.   Thermon Leyland, SPT Acute Rehab Services    Thermon Leyland 03/30/2021, 9:13 AM

## 2021-03-30 NOTE — Hospital Course (Signed)
HPI per Dr. Wynetta Fines on 03/29/21 James Richards is a 76 y.o. 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   Patient was watching TV Saturday night and suddenly lost left-sided patient, which he described as " a curtain fell down".  He complained to sleep, and woke up Sunday morning with some recovery of left vision.  Throughout today, the left-sided vision continues to improve, this morning, able to watch TV with left eye, denies any dark spot. He went to see ophthalmologist today who was concerned for left retinal vein occlusion and sent patient to ED.  Patient denies any headache or eye pain, he further denies any numbness weakness of any of the limbs, no speech changes, no balance problems.   ED Course: CT venogram negative for thrombosis, CTA of the head showed age indeterminant occlusion of left ICA with reconstitution at carotid terminus via patent left A1 ACA.  Left MCA small but opacified.  Attenuated distal MCA vessel, chronic occluded right vertebral artery, small/hypoplastic right P1 PCA with suspected superimposed severe stenosis.  Imaging was reviewed by neurology, impression is most changes are chronic.  Recommend patient to have stroke work-up with brain MRI.   Review of Systems: As per HPI otherwise 14 point review of systems negative  **Interim History Patient was worked up and seen by neurology and had a full CVA work-up.  He was changed to aspirin and Brilinta 90 mg p.o. twice daily.  He did well with PT OT and they recommend outpatient PT which will be arranged.  Neurology recommended aspirin and Brilinta for 3 months followed by just aspirin alone.  Dr. Leonie Man spoke with interventional neuroradiologist Dr. Norma Fredrickson about the patient's cardiac  occlusion and patient has a known history of carotid stenosis and passed in that region and he is not a candidate for surgery or endovascular intervention so the exact etiology for his occlusion was age-indeterminate and they did not recommend aggressive evaluation.  He is deemed stable to be discharged and follow-up with PCP and neurology in outpatient setting

## 2021-03-30 NOTE — Discharge Summary (Signed)
Physician Discharge Summary   Patient: James Richards MRN: [id number] DOB: Jun 07, 1945  Admit date:     03/29/2021  Discharge date: 03/30/21  Discharge Physician: Kerney Elbe   PCP: Lillard Anes, MD   Recommendations at discharge:   Follow up with PCP within 1-2 weeks  Follow up with Ophthalmology within 1-2 weeks Follow up with Neurology within 1 month Repeat CBC, CMP, Mag, Phos within 1 week   Discharge Diagnoses: Principal Problem:   Vision exam with abnormal findings concern for left retinal artery occlusion  Resolved Problems:   * No resolved hospital problems. [facility] Course: HPI per Dr. Wynetta Fines on 03/29/21 James Richards is a 76 y.o. 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   Patient was watching TV Saturday night and suddenly lost left-sided patient, which he described as " a curtain fell down".  He complained to sleep, and woke up Sunday morning with some recovery of left vision.  Throughout today, the left-sided vision continues to improve, this morning, able to watch TV with left eye, denies any dark spot. He went to see ophthalmologist today who was concerned for left retinal vein occlusion and sent patient to ED.  Patient denies any headache or eye pain, he further denies any numbness weakness of any of the limbs, no speech changes, no balance problems.   ED Course: CT venogram negative for thrombosis, CTA of the head showed age indeterminant occlusion of left ICA with reconstitution at carotid terminus via patent left A1 ACA.  Left MCA small but opacified.  Attenuated distal MCA vessel, chronic occluded right vertebral artery, small/hypoplastic right P1 PCA with suspected superimposed severe stenosis.  Imaging was  reviewed by neurology, impression is most changes are chronic.  Recommend patient to have stroke work-up with brain MRI.   Review of Systems: As per HPI otherwise 14 point review of systems negative  **Interim History Patient was worked up and seen by neurology and had a full CVA work-up.  He was changed to aspirin and Brilinta 90 mg p.o. twice daily.  He did well with PT OT and they recommend outpatient PT which will be arranged.  Neurology recommended aspirin and Brilinta for 3 months followed by just aspirin alone.  Dr. Leonie Man spoke with interventional neuroradiologist Dr. Norma Fredrickson about the patient's cardiac occlusion and patient has a known history of carotid stenosis and passed in that region and he is not a candidate for surgery or endovascular intervention so the exact etiology for his occlusion was age-indeterminate and they did not recommend aggressive evaluation.  He is deemed stable to be discharged and follow-up with PCP and neurology in outpatient setting  Assessment and Plan: No notes have been filed under this hospital service. Service: Hospitalist  Central Retinal Artery Occulsion in the setting of chronic left internal carotid artery occlusion with failure of collateral to the ophthalmic with partial improvement -Improving, rule out stroke.   Out-of-windowl for tPA. -Continue dual antiplatelet therapy and his antiplatelet therapy was changed from aspirin Plavix to aspirin Brilinta.   -Considering also the patient has had multiple TIAs recently, but given his history of intracranial hemorrhage, is probably a poor candidate for systemic anticoagulation.  -Patient had a full stroke work-up with a CT of the head which showed multiple remote  infarcts and small vessel disease, CTA of the head and neck showed a left ICA occlusion with reconstitution at the carotid terminus and occluded right vertebral artery and stenosis of P2 PCAs -Patient also had an MRI which showed no acute  intracranial pathology and did show multiple remote infarcts -Carotid Doppler showed only mild thickening plaque in extracranial vessels -Echocardiogram was done and showed an LVEF of 60 to 65% with no regional wall motion abnormalities and left ventricular tach parameters that were normal.  He had a normal right ventricular systolic function as well and a aortic valve that was tricuspid.  He had normal mitral valve tissue Doppler and the conclusion was normal biventricular function without evidence of hemodynamically significant valvular heart disease and no intracardiac source of embolism detected -Lipid panel done and showed a total cholesterol/HDL ratio 2.4, cholesterol level 99, HDL 41, LDL 47, triglycerides of 57, and VLDL 11 -Hemoglobin A1c was 5.8 -PT OT recommended outpatient PT and OT -Patient was discharged home in stable   HTN -Symptoms onset more than 2 days ago, although controlled for permissive hypertension, resume home BP meds  -Continue to Monitor BP per Protocol    Multiple chronic intracranial arteries stenosis -Patient already on aspirin, Plavix and high-dose of atorvastatin but he will be changed off of his Plavix to Brilinta   Chronic diastolic CHF -No symptoms or signs of acute decompensation, continue Lasix.   Hypothyroidism -Continue Synthroid  Normocytic Anemia -Hgb/Hct went 10.8/35.2 -> 11.2/33.0 -> 10.1/32.8 -Check Anemia Panel as an outpatient  -No S/Sx of Bleeding -Follow up with PCP within 1-2 weeks  Obesity -Complicates overall prognosis and care -Estimated body mass index is 31.55 kg/m as calculated from the following:   Height as of this encounter: 5\' 5"  (1.651 m).   Weight as of this encounter: 86 kg.  -Weight Loss and Dietary Counseling given  Pain control - [place] Controlled Substance Reporting System database was reviewed. and patient was instructed, not to drive, operate heavy machinery, perform activities at heights, swimming or  participation in water activities or provide baby-sitting services while on Pain, Sleep and Anxiety Medications; until their outpatient Physician has advised to do so again. Also recommended to not to take more than prescribed Pain, Sleep and Anxiety Medications.   Consultants: Neurology Procedures performed: MRI, ECHO, Cardotid Duplex  Disposition: Home Diet recommendation:  Discharge Diet Orders (From admission, onward)     Start     Ordered   03/30/21 0000  Diet - low sodium heart healthy        03/30/21 1617           Cardiac and Carb modified diet  DISCHARGE MEDICATION: Allergies as of 03/30/2021   No Known Allergies      Medication List     STOP taking these medications    clopidogrel 75 MG tablet Commonly known as: PLAVIX       TAKE these medications    acetaminophen-codeine 300-30 MG tablet Commonly known as: TYLENOL #3 Take 1 tablet by mouth every 4 (four) hours as needed for moderate pain.   aspirin EC 81 MG tablet Take 81 mg by mouth daily.   atorvastatin 80 MG tablet Commonly known as: LIPITOR TAKE 1 TABLET EVERY DAY   Fish Oil 1200 MG Cpdr Take 1 capsule by mouth daily.   furosemide 40 MG tablet Commonly known as: LASIX TAKE 1 TABLET EVERY DAY   levothyroxine 100 MCG tablet Commonly known as: SYNTHROID Take 100 mcg by mouth  daily. What changed: Another medication with the same name was removed. Continue taking this medication, and follow the directions you see here.   losartan 50 MG tablet Commonly known as: COZAAR TAKE 1 TABLET EVERY DAY   minocycline 50 MG capsule Commonly known as: MINOCIN TAKE 1 CAPSULE EVERY 12 HOURS What changed: See the new instructions.   omeprazole 40 MG capsule Commonly known as: PRILOSEC TAKE 1 CAPSULE EVERY DAY   rOPINIRole 0.5 MG tablet Commonly known as: REQUIP TAKE 1 TABLET AT BEDTIME What changed:  when to take this reasons to take this   senna-docusate 8.6-50 MG tablet Commonly known as:  Senokot-S Take 1 tablet by mouth at bedtime as needed for mild constipation.   ticagrelor 90 MG Tabs tablet Commonly known as: BRILINTA Take 1 tablet (90 mg total) by mouth 2 (two) times daily.   Trelegy Ellipta 100-62.5-25 MCG/ACT Aepb Generic drug: Fluticasone-Umeclidin-Vilant Take 1 puff by mouth daily.        Follow-up Information     [facility] Follow up.   Why: The outpatient therapy will contact you for the first home visit Contact information: [address], [place], [place] [address]  [phone]                Discharge Exam: [facility] Weights   03/30/21 0026  Weight: 86 kg   Vitals:   03/30/21 1230 03/30/21 1538  BP: 112/74 117/71  Pulse: 97 80  Resp: 20 20  Temp: 98.1 F (36.7 C) 98 F (36.7 C)  SpO2: 98% 93%   Examination: Physical Exam:  Constitutional: WN/WD obese Caucasian male in NAD and appears calm and comfortable Respiratory: Diminished to auscultation bilaterally, no wheezing, rales, rhonchi or crackles. Normal respiratory effort and patient is not tachypenic. No accessory muscle use. Unlabored breathing .  Cardiovascular: RRR, no murmurs / rubs / gallops. S1 and S2 auscultated. Trace Abdomen: Soft, non-tender, Distended 2/2 body habitus. Bowel sounds positive.  GU: Deferred.  Condition at discharge: stable  The results of significant diagnostics from this hospitalization (including imaging, microbiology, ancillary and laboratory) are listed below for reference.   Imaging Studies: CT Angio Head W or Wo Contrast  Result Date: 03/29/2021 CLINICAL DATA:  vision abnormality, need contrast per neuro; Vision loss, monocular Left eye vision loss EXAM: CT ANGIOGRAPHY HEAD CT VENOGRAM HEAD TECHNIQUE: Multidetector CT imaging of the head was performed using the standard protocol during bolus administration of intravenous contrast. Multiplanar CT image reconstructions and MIPs were obtained to evaluate the vascular anatomy.  Multidetector CT imaging of the head was performed using the venous protocol during bolus administration of intravenous contrast to assess the dural venous sinuses. Multiplanar CT image reconstructions and MIPs were obtained to evaluate the vascular anatomy. RADIATION DOSE REDUCTION: This exam was performed according to the departmental dose-optimization program which includes automated exposure control, adjustment of the mA and/or kV according to patient size and/or use of iterative reconstruction technique. CONTRAST:  79mL OMNIPAQUE IOHEXOL 350 MG/ML SOLN COMPARISON:  CTA Head Jul 12, 2015. FINDINGS: CT HEAD Brain: Remote infarcts in the anterior left temporal lobe, left frontal lobe, left parietal lobe and right temporal lobe. There are some additional smaller cortical infarcts in the right frontal lobe. These appear similar. Additional patchy white matter hypoattenuation, nonspecific but compatible chronic microvascular ischemic disease. No evidence of acute large vascular territory infarct. No evidence of acute hemorrhage, mass lesion, midline shift, hydrocephalus. Vascular: See below. Skull: No acute fracture. Sinuses: Mild paranasal sinus mucosal  thickening. CTA HEAD Anterior circulation: Occlusion the visualized left ICA to the carotid terminus. Left MCA is opacified, probably via patent left A1 ACA. Left MCA is small. Attenuated distal MCA vessels bilaterally in the regions of prior infarcts. The right intracranial ICA is patent with mild atherosclerotic narrowing. Right MCA and bilateral ACAs are patent without proximal high-grade stenosis. Posterior circulation: Chronically occluded right intradural vertebral artery. Left vertebral artery. Mild irregularity and narrowing of the left vertebral artery, similar. Mild narrowing of the basilar artery. Right PCA is largely supplied by the right posterior communicating artery with small or absent right P1 PCA with probable superimposed P1 PCA stenosis, similar.  Left posterior communicating artery remains patent. Severe stenosis of the left P2 PCA. Also, severe stenosis of the distal right P2 PCA. Venous sinuses: See below. Anatomic variants: Detailed above. Review of the MIP images confirms the above findings. CT VENOGRAM HEAD No evidence of dural venous sinus thrombosis. The superior sagittal sinus IMPRESSION: CT head: 1. Multiple bilateral remote infarcts, which appears similar by CT. No definite new/interval acute infarcts; however, MRI could provide more sensitive evaluation for acute infarct if clinically indicated. 2. Chronic microvascular ischemic disease. CTA head: 1. Age indeterminate occlusion of the left ICA with reconstitution at the carotid terminus via patent left A1 ACA. Left MCA is small, but opacified. Attenuated distal MCA vessels in regions of prior infarcts bilaterally. 2. Chronically occluded right vertebral artery. 3. Small/hypoplastic right P1 PCA with suspected superimposed severe stenosis. Also, severe stenosis of bilateral P2 PCAs. CT venogram: No evidence of dural venous sinus thrombosis. Findings discussed with Dr. Rogene Houston via telephone at 2:50 p.m. Electronically Signed   By: Margaretha Sheffield M.D.   On: 03/29/2021 15:12   MR BRAIN WO CONTRAST  Result Date: 03/29/2021 CLINICAL DATA:  Vision loss in left eye since Saturday, retinal vein occlusion diagnosed at the eye doctor, history of strokes EXAM: MRI HEAD WITHOUT CONTRAST TECHNIQUE: Multiplanar, multiecho pulse sequences of the brain and surrounding structures were obtained without intravenous contrast. COMPARISON:  Brain MRI 11/24/2014, same-day CTA and CT venogram head FINDINGS: Brain: There is no evidence of acute intracranial hemorrhage, extra-axial fluid collection, or acute infarct. There is mild global parenchymal volume loss with prominence of the ventricular system and extra-axial CSF spaces. Remote infarcts are seen in the left anterior temporal lobe, left insula and frontal  lobe, left parietal lobe, and right temporal lobe. A small remote infarct is seen in the pons. Additional foci of FLAIR signal abnormality in the subcortical and periventricular white matter likely reflects sequela of mild chronic white matter microangiopathy. There is no solid mass lesion.  There is no midline shift. Vascular: The left ICA flow void is absent, as seen on prior CTA. The left MCA flow void is present. The right V4 segment flow void is not identified. The other major flow voids are present. Skull and upper cervical spine: Normal marrow signal. Sinuses/Orbits: Paranasal sinuses are clear. Bilateral lens implants are in place. The globes and orbits are otherwise unremarkable. Other: There is a right mastoid effusion. IMPRESSION: 1. No acute intracranial pathology. 2. Multiple remote infarcts in the bilateral cerebral hemispheres as above. Electronically Signed   By: Valetta Mole M.D.   On: 03/29/2021 18:43   DG Chest Port 1 View  Result Date: 03/29/2021 CLINICAL DATA:  History of chronic diastolic heart failure, lung disease, presents with left vision changes EXAM: PORTABLE CHEST 1 VIEW COMPARISON:  Chest radiograph 12/02/2019 FINDINGS: The heart is enlarged, unchanged.  The upper mediastinal contours are within normal limits. There are patchy retrocardiac opacities. There is no other focal consolidation or pulmonary edema. There is no pleural effusion or pneumothorax. There is a moderate-to-large hiatal hernia. There is no acute osseous abnormality. IMPRESSION: 1. Retrocardiac opacities may reflect atelectasis, infection, or aspiration. Consider PA and lateral chest radiographs for better evaluation. 2. Cardiomegaly. 3. Moderate to large hiatal hernia. Electronically Signed   By: Valetta Mole M.D.   On: 03/29/2021 20:30   ECHOCARDIOGRAM COMPLETE  Result Date: 03/30/2021    ECHOCARDIOGRAM REPORT   Patient Name:   TAAJ HURLBUT Date of Exam: 03/30/2021 Medical Rec #:  [id number]     Height:        65.0 in Accession #:    [id number]    Weight:       189.6 lb Date of Birth:  [birth year]    BSA:          1.934 m Patient Age:    41 years      BP:           143/60 mmHg Patient Gender: M             HR:           80 bpm. Exam Location:  Inpatient Procedure: 2D Echo Indications:    Stroke  History:        Patient has prior history of Echocardiogram examinations, most                 recent 12/19/2019. CAD, TIA; Risk Factors:Hypertension.  Sonographer:    Arlyss Gandy Referring Phys: [id number] [place]  1. Left ventricular ejection fraction, by estimation, is 60 to 65%. The left ventricle has normal function. The left ventricle has no regional wall motion abnormalities. Left ventricular diastolic parameters were normal.  2. Right ventricular systolic function is normal. The right ventricular size is normal. There is normal pulmonary artery systolic pressure. The estimated right ventricular systolic pressure is 75.9 mmHg.  3. The mitral valve is grossly normal. Trivial mitral valve regurgitation. No evidence of mitral stenosis.  4. The aortic valve is tricuspid. There is mild calcification of the aortic valve. Aortic valve regurgitation is not visualized. No aortic stenosis is present.  5. The inferior vena cava is normal in size with greater than 50% respiratory variability, suggesting right atrial pressure of 3 mmHg. Comparison(s): No significant change from prior study. Normal MV tissue doppler. Diastolic parameters are normal. Conclusion(s)/Recommendation(s): Normal biventricular function without evidence of hemodynamically significant valvular heart disease. No intracardiac source of embolism detected on this transthoracic study. Consider a transesophageal echocardiogram to exclude cardiac source of embolism if clinically indicated. FINDINGS  Left Ventricle: Left ventricular ejection fraction, by estimation, is 60 to 65%. The left ventricle has normal function. The left ventricle has no regional  wall motion abnormalities. The left ventricular internal cavity size was normal in size. There is  no left ventricular hypertrophy. Left ventricular diastolic parameters were normal. Right Ventricle: The right ventricular size is normal. No increase in right ventricular wall thickness. Right ventricular systolic function is normal. There is normal pulmonary artery systolic pressure. The tricuspid regurgitant velocity is 1.69 m/s, and  with an assumed right atrial pressure of 3 mmHg, the estimated right ventricular systolic pressure is 16.3 mmHg. Left Atrium: Left atrial size was normal in size. Right Atrium: Right atrial size was normal in size. Pericardium: Trivial pericardial effusion is present. Mitral Valve: The mitral valve is  grossly normal. Trivial mitral valve regurgitation. No evidence of mitral valve stenosis. Tricuspid Valve: The tricuspid valve is grossly normal. Tricuspid valve regurgitation is trivial. No evidence of tricuspid stenosis. Aortic Valve: The aortic valve is tricuspid. There is mild calcification of the aortic valve. Aortic valve regurgitation is not visualized. No aortic stenosis is present. Aortic valve mean gradient measures 6.0 mmHg. Aortic valve peak gradient measures 10.2 mmHg. Aortic valve area, by VTI measures 2.11 cm. Pulmonic Valve: The pulmonic valve was grossly normal. Pulmonic valve regurgitation is not visualized. No evidence of pulmonic stenosis. Aorta: The aortic root and ascending aorta are structurally normal, with no evidence of dilitation. Venous: The inferior vena cava is normal in size with greater than 50% respiratory variability, suggesting right atrial pressure of 3 mmHg. IAS/Shunts: The interatrial septum appears to be lipomatous. The atrial septum is grossly normal.  LEFT VENTRICLE PLAX 2D LVIDd:         4.00 cm   Diastology LVIDs:         2.40 cm   LV e' medial:    7.62 cm/s LV PW:         1.10 cm   LV E/e' medial:  11.7 LV IVS:        1.10 cm   LV e' lateral:    9.68 cm/s LVOT diam:     2.00 cm   LV E/e' lateral: 9.2 LV SV:         70 LV SV Index:   36 LVOT Area:     3.14 cm  RIGHT VENTRICLE             IVC RV Basal diam:  3.50 cm     IVC diam: 1.90 cm RV Mid diam:    3.50 cm RV S prime:     11.50 cm/s TAPSE (M-mode): 1.7 cm LEFT ATRIUM             Index        RIGHT ATRIUM           Index LA diam:        3.70 cm 1.91 cm/m   RA Area:     17.00 cm LA Vol (A2C):   52.0 ml 26.89 ml/m  RA Volume:   45.90 ml  23.74 ml/m LA Vol (A4C):   52.6 ml 27.20 ml/m LA Biplane Vol: 53.0 ml 27.41 ml/m  AORTIC VALVE AV Area (Vmax):    2.18 cm AV Area (Vmean):   1.97 cm AV Area (VTI):     2.11 cm AV Vmax:           160.00 cm/s AV Vmean:          110.000 cm/s AV VTI:            0.331 m AV Peak Grad:      10.2 mmHg AV Mean Grad:      6.0 mmHg LVOT Vmax:         111.00 cm/s LVOT Vmean:        69.100 cm/s LVOT VTI:          0.222 m LVOT/AV VTI ratio: 0.67  AORTA Ao Root diam: 3.00 cm Ao Asc diam:  3.30 cm MITRAL VALVE               TRICUSPID VALVE MV Area (PHT): 3.39 cm    TR Peak grad:   11.4 mmHg MV Decel Time: 224 msec    TR Vmax:  169.00 cm/s MV E velocity: 89.50 cm/s MV A velocity: 76.30 cm/s  SHUNTS MV E/A ratio:  1.17        Systemic VTI:  0.22 m                            Systemic Diam: 2.00 cm Eleonore Chiquito MD Electronically signed by Eleonore Chiquito MD Signature Date/Time: 03/30/2021/9:46:44 AM    Final    CT VENOGRAM HEAD  Result Date: 03/29/2021 CLINICAL DATA:  vision abnormality, need contrast per neuro; Vision loss, monocular Left eye vision loss EXAM: CT ANGIOGRAPHY HEAD CT VENOGRAM HEAD TECHNIQUE: Multidetector CT imaging of the head was performed using the standard protocol during bolus administration of intravenous contrast. Multiplanar CT image reconstructions and MIPs were obtained to evaluate the vascular anatomy. Multidetector CT imaging of the head was performed using the venous protocol during bolus administration of intravenous contrast to assess the  dural venous sinuses. Multiplanar CT image reconstructions and MIPs were obtained to evaluate the vascular anatomy. RADIATION DOSE REDUCTION: This exam was performed according to the departmental dose-optimization program which includes automated exposure control, adjustment of the mA and/or kV according to patient size and/or use of iterative reconstruction technique. CONTRAST:  91mL OMNIPAQUE IOHEXOL 350 MG/ML SOLN COMPARISON:  CTA Head Jul 12, 2015. FINDINGS: CT HEAD Brain: Remote infarcts in the anterior left temporal lobe, left frontal lobe, left parietal lobe and right temporal lobe. There are some additional smaller cortical infarcts in the right frontal lobe. These appear similar. Additional patchy white matter hypoattenuation, nonspecific but compatible chronic microvascular ischemic disease. No evidence of acute large vascular territory infarct. No evidence of acute hemorrhage, mass lesion, midline shift, hydrocephalus. Vascular: See below. Skull: No acute fracture. Sinuses: Mild paranasal sinus mucosal thickening. CTA HEAD Anterior circulation: Occlusion the visualized left ICA to the carotid terminus. Left MCA is opacified, probably via patent left A1 ACA. Left MCA is small. Attenuated distal MCA vessels bilaterally in the regions of prior infarcts. The right intracranial ICA is patent with mild atherosclerotic narrowing. Right MCA and bilateral ACAs are patent without proximal high-grade stenosis. Posterior circulation: Chronically occluded right intradural vertebral artery. Left vertebral artery. Mild irregularity and narrowing of the left vertebral artery, similar. Mild narrowing of the basilar artery. Right PCA is largely supplied by the right posterior communicating artery with small or absent right P1 PCA with probable superimposed P1 PCA stenosis, similar. Left posterior communicating artery remains patent. Severe stenosis of the left P2 PCA. Also, severe stenosis of the distal right P2 PCA.  Venous sinuses: See below. Anatomic variants: Detailed above. Review of the MIP images confirms the above findings. CT VENOGRAM HEAD No evidence of dural venous sinus thrombosis. The superior sagittal sinus IMPRESSION: CT head: 1. Multiple bilateral remote infarcts, which appears similar by CT. No definite new/interval acute infarcts; however, MRI could provide more sensitive evaluation for acute infarct if clinically indicated. 2. Chronic microvascular ischemic disease. CTA head: 1. Age indeterminate occlusion of the left ICA with reconstitution at the carotid terminus via patent left A1 ACA. Left MCA is small, but opacified. Attenuated distal MCA vessels in regions of prior infarcts bilaterally. 2. Chronically occluded right vertebral artery. 3. Small/hypoplastic right P1 PCA with suspected superimposed severe stenosis. Also, severe stenosis of bilateral P2 PCAs. CT venogram: No evidence of dural venous sinus thrombosis. Findings discussed with Dr. Rogene Houston via telephone at 2:50 p.m. Electronically Signed   By: Jamesetta So.D.  On: 03/29/2021 15:12   VAS US CAROTID  Result Date: 03/30/2021 Carotid Arterial Duplex Study Patient Name:  LAAKEA PEREIRA [facility]  Date of Exam:   03/30/2021 Medical Rec #: [id number]      Accession #:    [id number] Date of Birth: 01/22/1946     Patient Gender: M Patient Age:   42 years Exam Location:  [facility] Procedure:      VAS US CAROTID Referring Phys: Elwin Sleight DE LA TORRE --------------------------------------------------------------------------------  Indications:       TIA. Risk Factors:      Hypertension, hyperlipidemia. Limitations        Today's exam was limited due to anatomy, depth of vessels,                    tortuosity. Comparison Study:  No prior study Performing Technologist: Maudry Mayhew MHA, RDMS, RVT, RDCS  Examination Guidelines: A complete evaluation includes B-mode imaging, spectral Doppler, color Doppler, and power Doppler as needed of all  accessible portions of each vessel. Bilateral testing is considered an integral part of a complete examination. Limited examinations for reoccurring indications may be performed as noted.  Right Carotid Findings: +----------+--------+--------+--------+---------------------+------------------+             PSV cm/s EDV cm/s Stenosis Plaque Description    Comments            +----------+--------+--------+--------+---------------------+------------------+  CCA Prox   92       29                                                          +----------+--------+--------+--------+---------------------+------------------+  CCA Distal 101      37                                      intimal thickening  +----------+--------+--------+--------+---------------------+------------------+  ICA Prox   94       33                smooth, heterogenous  tortuous                                                   and calcific                              +----------+--------+--------+--------+---------------------+------------------+  ICA Mid    134      49                                      tortuous            +----------+--------+--------+--------+---------------------+------------------+  ICA Distal 111      41                                      tortuous            +----------+--------+--------+--------+---------------------+------------------+  ECA        134      14                                                          +----------+--------+--------+--------+---------------------+------------------+ +----------+--------+-------+----------------+-------------------+             PSV cm/s EDV cms Describe         Arm Pressure (mmHG)  +----------+--------+-------+----------------+-------------------+  Subclavian 108              Multiphasic, WNL                      +----------+--------+-------+----------------+-------------------+ +---------+--------+--------+---------+  Vertebral PSV cm/s EDV cm/s Antegrade   +---------+--------+--------+---------+  Left Carotid Findings: +----------+--------+--------+--------+--------------------------+--------+             PSV cm/s EDV cm/s Stenosis Plaque Description         Comments  +----------+--------+--------+--------+--------------------------+--------+  CCA Prox   74       6                                                      +----------+--------+--------+--------+--------------------------+--------+  CCA Distal 68       12                irregular and heterogenous           +----------+--------+--------+--------+--------------------------+--------+  ICA Prox   35       9                                                      +----------+--------+--------+--------+--------------------------+--------+  ICA Distal 33       9                                                      +----------+--------+--------+--------+--------------------------+--------+  ECA        56       8                                                      +----------+--------+--------+--------+--------------------------+--------+ +----------+--------+--------+----------------+-------------------+             PSV cm/s EDV cm/s Describe         Arm Pressure (mmHG)  +----------+--------+--------+----------------+-------------------+  Subclavian 149               Multiphasic, WNL                      +----------+--------+--------+----------------+-------------------+ +---------+--------+--+--------+--+---------+  Vertebral PSV cm/s 51 EDV cm/s 16 Antegrade  +---------+--------+--+--------+--+---------+   Summary: Right Carotid: The extracranial vessels were near-normal with only mild wall  thickening or plaque. Left Carotid: The extracranial vessels were near-normal with only mild wall               thickening or plaque. Vertebrals:  Bilateral vertebral arteries demonstrate antegrade flow. Subclavians: Normal flow hemodynamics were seen in bilateral subclavian              arteries. *See table(s)  above for measurements and observations.     Preliminary     Microbiology: Results for orders placed or performed during the hospital encounter of 03/29/21  Resp Panel by RT-PCR (Flu A&B, Covid) Nasopharyngeal Swab     Status: None   Collection Time: 03/29/21  2:31 PM   Specimen: Nasopharyngeal Swab; Nasopharyngeal(NP) swabs in vial transport medium  Result Value Ref Range Status   SARS Coronavirus 2 by RT PCR NEGATIVE NEGATIVE Final    Comment: (NOTE) SARS-CoV-2 target nucleic acids are NOT DETECTED.  The SARS-CoV-2 RNA is generally detectable in upper respiratory specimens during the acute phase of infection. The lowest concentration of SARS-CoV-2 viral copies this assay can detect is 138 copies/mL. A negative result does not preclude SARS-Cov-2 infection and should not be used as the sole basis for treatment or other patient management decisions. A negative result may occur with  improper specimen collection/handling, submission of specimen other than nasopharyngeal swab, presence of viral mutation(s) within the areas targeted by this assay, and inadequate number of viral copies(<138 copies/mL). A negative result must be combined with clinical observations, patient history, and epidemiological information. The expected result is Negative.  Fact Sheet for Patients:  EntrepreneurPulse.com.au  Fact Sheet for Healthcare Providers:  IncredibleEmployment.be  This test is no t yet approved or cleared by the [place] FDA and  has been authorized for detection and/or diagnosis of SARS-CoV-2 by FDA under an Emergency Use Authorization (EUA). This EUA will remain  in effect (meaning this test can be used) for the duration of the COVID-19 declaration under Section 564(b)(1) of the Act, 21 U.S.C.section 360bbb-3(b)(1), unless the authorization is terminated  or revoked sooner.       Influenza A by PCR NEGATIVE NEGATIVE Final   Influenza B by PCR  NEGATIVE NEGATIVE Final    Comment: (NOTE) The Xpert Xpress SARS-CoV-2/FLU/RSV plus assay is intended as an aid in the diagnosis of influenza from Nasopharyngeal swab specimens and should not be used as a sole basis for treatment. Nasal washings and aspirates are unacceptable for Xpert Xpress SARS-CoV-2/FLU/RSV testing.  Fact Sheet for Patients: EntrepreneurPulse.com.au  Fact Sheet for Healthcare Providers: IncredibleEmployment.be  This test is not yet approved or cleared by the [place] FDA and has been authorized for detection and/or diagnosis of SARS-CoV-2 by FDA under an Emergency Use Authorization (EUA). This EUA will remain in effect (meaning this test can be used) for the duration of the COVID-19 declaration under Section 564(b)(1) of the Act, 21 U.S.C. section 360bbb-3(b)(1), unless the authorization is terminated or revoked.  Performed at [facility], [place] [address], [place], [place] [address]    Labs: CBC: Recent Labs  Lab 03/29/21 1213 03/29/21 1217 03/30/21 0331  WBC 6.4  --  7.0  NEUTROABS 4.3  --  5.0  HGB 10.8* 11.2* 10.1*  HCT 35.2* 33.0* 32.8*  MCV 88.7  --  87.2  PLT 325  --  332   Basic Metabolic Panel: Recent Labs  Lab 03/29/21 1213 03/29/21 1217 03/30/21 0331  NA 139 139 139  K 4.2 5.0 3.8  CL 106 104 103  CO2 26  --  25  GLUCOSE 94 93 81  BUN 16 23 16   CREATININE 0.96 0.90 0.99  CALCIUM 9.2  --  9.9  MG  --   --  1.8  PHOS  --   --  3.7   Liver Function Tests: Recent Labs  Lab 03/29/21 1213 03/30/21 0331  AST 27 28  ALT 25 25  ALKPHOS 69 66  BILITOT 0.5 0.4  PROT 6.3* 6.2*  ALBUMIN 3.4* 3.4*   CBG: No results for input(s): GLUCAP in the last 168 hours.  Discharge time spent: greater than 30 minutes.  Signed: Kerney Elbe, DO [facility] 03/30/2021

## 2021-03-30 NOTE — Evaluation (Signed)
Physical Therapy Evaluation Patient Details Name: James Richards MRN: [id number] DOB: [birth year] Today's Date: 03/30/2021  History of Present Illness  76 y.o. male presenting to [facility] on 2/13 with new onset of vision changes. Head CTA shows indeterminant occlusion of left ICA, Attenuated distal MCA vessel, chronic occluded right vertebral artery, small/hypoplastic right P1 PCA with suspected superimposed severe stenosis. Head MRI shows the left ICA flow void is absent but no acute intracranial pathology. 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  Clinical Impression   Pt presents with [facility] strength, mobility, activity tolerance, and balance. Pt's only deficit is L superior peripheral vision deficit, PT educated pt on importance of visual scanning to ensure pt avoids environmental obstacles, both pt and cousin James Richards whom pt lives with express understanding. Per pt and pt's cousin, pt is at mobility baseline, no further PT needs acutely or post-acutely.         Recommendations for follow up therapy are one component of a multi-disciplinary discharge planning process, led by the attending physician.  Recommendations may be updated based on patient status, additional functional criteria and insurance authorization.  Follow Up Recommendations No PT follow up    Assistance Recommended at Discharge PRN  Patient can return home with the following       Equipment Recommendations None recommended by PT  Recommendations for Other Services       Functional Status Assessment Patient has not had a recent decline in their functional status     Precautions / Restrictions Precautions Precautions: Fall Restrictions Weight Bearing Restrictions: No      Mobility  Bed Mobility Overal bed mobility: Modified Independent                   Transfers Overall transfer level: Modified independent Equipment used: None                    Ambulation/Gait Ambulation/Gait assistance: Supervision Gait Distance (Feet): 360 Feet Assistive device: None Gait Pattern/deviations: Step-through pattern, WFL(Within Functional Limits) Gait velocity: WFL     General Gait Details: WFL, even with challenges to dynamic balance (see balance section)  Stairs            Wheelchair Mobility    Modified Rankin (Stroke Patients Only) Modified Rankin (Stroke Patients Only) Pre-Morbid Rankin Score: No symptoms Modified Rankin: No significant disability (L visual change)     Balance Overall balance assessment: Modified Independent   Sitting balance-Leahy Scale: Normal       Standing balance-Leahy Scale: Good                   Standardized Balance Assessment Standardized Balance Assessment : Dynamic Gait Index   Dynamic Gait Index Level Surface: Normal Change in Gait Speed: Normal Gait with Horizontal Head Turns: Normal Gait with Vertical Head Turns: Normal Gait and Pivot Turn: Normal Step Over Obstacle: Normal Step Around Obstacles: Normal Steps: Mild Impairment Total Score: 23       Pertinent Vitals/Pain Pain Assessment Pain Assessment: No/denies pain    Home Living Family/patient expects to be discharged to:: Private residence Living Arrangements: Other relatives Available Help at Discharge: Family;Available 24 hours/day Type of Home: House Home Access: Stairs to enter   [employer] of Steps: 2   Home Layout: Laundry or work area in [place]: Conservation officer, nature (2 wheels);Cane - single point  Additional Comments: lives with cousin, James Richards.    Prior Function Prior Level of Function : Independent/Modified Independent             Mobility Comments: no AD ADLs Comments: drives short distances     Hand Dominance   Dominant Hand: Right    Extremity/Trunk  Assessment   Upper Extremity Assessment Upper Extremity Assessment: Defer to OT evaluation    Lower Extremity Assessment Lower Extremity Assessment: Overall WFL for tasks assessed (grossly 5/5)    Cervical / Trunk Assessment Cervical / Trunk Assessment: Kyphotic (forward head, rounded shoulders)  Communication   Communication: No difficulties  Cognition Arousal/Alertness: Awake/alert Behavior During Therapy: WFL for tasks assessed/performed Overall Cognitive Status: Within Functional Limits for tasks assessed                                 General Comments: Limited insight to safety/deficits, demonstrates difficulty with directions and hallway navigation. Some difficulty with multi-step commands, could be related to [place] comments (skin integrity, edema, etc.): VSS on RA, cousin present and supportive    Exercises     Assessment/Plan    PT Assessment Patient does not need any further PT services  PT Problem List         PT Treatment Interventions      PT Goals (Current goals can be found in the Care Plan section)  Acute Rehab PT Goals Patient Stated Goal: home PT Goal Formulation: With patient Time For Goal Achievement: 03/30/21 Potential to Achieve Goals: Good    Frequency       Co-evaluation               AM-PAC PT "6 Clicks" Mobility  Outcome Measure Help needed turning from your back to your side while in a flat bed without using bedrails?: None Help needed moving from lying on your back to sitting on the side of a flat bed without using bedrails?: None Help needed moving to and from a bed to a chair (including a wheelchair)?: None Help needed standing up from a chair using your arms (e.g., wheelchair or bedside chair)?: None Help needed to walk in hospital room?: None Help needed climbing 3-5 steps with a railing? : A Little 6 Click Score: 23    End of Session   Activity Tolerance: Patient tolerated  treatment well Patient left: in bed;with call bell/phone within reach;with family/visitor present Nurse Communication: Mobility status PT Visit Diagnosis: Other abnormalities of gait and mobility (R26.89)    Time: 7939-0300 PT Time Calculation (min) (ACUTE ONLY): 17 min   Charges:   PT Evaluation $PT Eval Low Complexity: 1 Low        Aseret Hoffman S, PT DPT Acute Rehabilitation Services Pager [phone]  Office [phone]   Roxine Caddy E Ruffin Pyo 03/30/2021, 12:51 PM

## 2021-03-30 NOTE — Evaluation (Signed)
Occupational Therapy Evaluation Patient Details Name: James Richards MRN: [id number] DOB: 1945/06/10 Today's Date: 03/30/2021   History of Present Illness 76 y.o. male presenting to [facility] on 2/13 with new onset of vision changes. Head CTA shows indeterminant occlusion of left ICA, Attenuated distal MCA vessel, chronic occluded right vertebral artery, small/hypoplastic right P1 PCA with suspected superimposed severe stenosis. Head MRI shows the left ICA flow void is absent but no acute intracranial pathology. 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   Clinical Impression   [name] was evaluated s/p the above admission list. He is indep at baseline including IADLs and driving. He lives with his cousin, James Richards, who is able to assist as needed at d/c. Upon evaluation pt demonstrated mod I ADLs, transfers and mobility. He had some difficulty following multiple step commands for vision assessment, likely due to [facility], however he does present with L peripheral vision field impairment. Reviewed compensatory techniques for safety to reduce falls or injury, pt and his cousin verbalized good understanding. Pt would benefit from continue OT acutely to address vision impairment. Recommend d/c to home with neuro OP OT follow up for vision, and follow up with eye doctor.      Recommendations for follow up therapy are one component of a multi-disciplinary discharge planning process, led by the attending physician.  Recommendations may be updated based on patient status, additional functional criteria and insurance authorization.   Follow Up Recommendations  Outpatient OT (neuro OP OT for vision)    Assistance Recommended at Discharge PRN  Patient can return home with the following Assist for transportation    Functional Status Assessment  Patient has had a  recent decline in their functional status and demonstrates the ability to make significant improvements in function in a reasonable and predictable amount of time.  Equipment Recommendations  None recommended by OT       Precautions / Restrictions Precautions Precautions: Fall Restrictions Weight Bearing Restrictions: No      Mobility Bed Mobility Overal bed mobility: Independent                  Transfers Overall transfer level: Modified independent                 General transfer comment: mild incr time, 1 UE supported      Balance Overall balance assessment: No apparent balance deficits (not formally assessed)                                         ADL either performed or assessed with clinical judgement   ADL Overall ADL's : Modified independent                                       General ADL Comments: mod I for slightly increased time, and pt noted to reach out for external support. Minimal cues to scan L environment for safety.     Vision Baseline Vision/History: 0 No visual deficits Ability to See in Adequate Light: 1 Impaired Patient Visual Report: Peripheral vision impairment Vision Assessment?: Yes Eye Alignment: Within Functional Limits Ocular Range of Motion: Within Functional Limits Alignment/Gaze Preference: Within Defined Limits Tracking/Visual Pursuits: Able  to track stimulus in all quads without difficulty Visual Fields: Left visual field deficit Additional Comments: L peripherial vision inpairement, superior more limited than inferior. Pt had difficulty following directions during vision assessment. Would benefit from further testing            Pertinent Vitals/Pain Pain Assessment Pain Assessment: No/denies pain     Hand Dominance Right   Extremity/Trunk Assessment Upper Extremity Assessment Upper Extremity Assessment: Overall WFL for tasks assessed   Lower Extremity Assessment Lower  Extremity Assessment: Defer to PT evaluation   Cervical / Trunk Assessment Cervical / Trunk Assessment: Kyphotic (foreward head, pt states it is due to scar tissue from cancer & an accident)   Communication Communication Communication: No difficulties   Cognition Arousal/Alertness: Awake/alert Behavior During Therapy: WFL for tasks assessed/performed Overall Cognitive Status: Within Functional Limits for tasks assessed           General Comments: Limited insight to safety/deficits. Some difficulty with multi-step commands, could be related to [place] Comments  VSS on RA, cousin present and supportive     Home Living Family/patient expects to be discharged to:: Private residence Living Arrangements: Other relatives Available Help at Discharge: Family;Available 24 hours/day Type of Home: House Home Access: Stairs to enter [employer] of Steps: 2   Home Layout: Laundry or work area in basement     [employer] Shower/Tub: Occupational psychologist: [place]: Conservation officer, nature (2 wheels);Kasandra Knudsen - single point   Additional Comments: lives with cousin, James Richards.      Prior Functioning/Environment Prior Level of Function : Independent/Modified Independent             Mobility Comments: no AD ADLs Comments: drives short distances        OT Problem List: Impaired vision/perception      OT Treatment/Interventions: Visual/perceptual remediation/compensation    OT Goals(Current goals can be found in the care plan section) Acute Rehab OT Goals Patient Stated Goal: home OT Goal Formulation: With patient Time For Goal Achievement: 04/13/21 Potential to Achieve Goals: Good ADL Goals Additional ADL Goal #1: Pt will indep verbalize at least 2 vision compensation strategies to apply during ADL/IADLs for safety  OT Frequency: Min 2X/week       AM-PAC OT "6 Clicks" Daily Activity     Outcome Measure Help from another person eating  meals?: None Help from another person taking care of personal grooming?: None Help from another person toileting, which includes using toliet, bedpan, or urinal?: None Help from another person bathing (including washing, rinsing, drying)?: None Help from another person to put on and taking off regular upper body clothing?: None Help from another person to put on and taking off regular lower body clothing?: None 6 Click Score: 24   End of Session Nurse Communication: Mobility status  Activity Tolerance: Patient tolerated treatment well Patient left: in bed;with call bell/phone within reach;with family/visitor present  OT Visit Diagnosis: Low vision, both eyes (H54.2)                Time: 1005-1018 OT Time Calculation (min): 13 min Charges:  OT General Charges $OT Visit: 1 Visit OT Evaluation $OT Eval Moderate Complexity: 1 Mod   Trulee Hamstra A Connor Foxworthy 03/30/2021, 12:37 PM

## 2021-03-30 NOTE — Care Management Obs Status (Signed)
[place] NOTIFICATION   Patient Details  Name: James Richards MRN: [id number] Date of Birth: Jun 05, 1945   Medicare Observation Status Notification Given:  Yes    Pollie Friar, RN 03/30/2021, 3:49 PM

## 2021-03-30 NOTE — Progress Notes (Signed)
Carotid artery duplex completed. Refer to "CV Proc" under chart review to view preliminary results.  03/30/2021 12:05 PM Kelby Aline., MHA, RVT, RDCS, RDMS

## 2021-03-30 NOTE — Progress Notes (Signed)
Echocardiogram 2D Echocardiogram has been performed.  James Richards 03/30/2021, 9:25 AM

## 2021-03-30 NOTE — Progress Notes (Signed)
Discharge instructions (including medications) discussed with and copy provided to patient/caregiver. All belongings sent with patient. 

## 2021-03-30 NOTE — Progress Notes (Addendum)
STROKE TEAM PROGRESS NOTE   INTERVAL HISTORY Patient is seen in his room with one family member at the bedside.  Yesterday, he lost vision in his left eye (he has had multiple episodes of amaurosis fugax in the past).  His vision did improve to the point where he now has a large central scotoma but can see light in the periphery of his vision.  He has had multiple strokes and TIAs in the past, starting at the age of 76.  He did have an angiogram in 2016 which revealed 60% stenosis of the mid segment of his left ICA.  This was not felt to be amenable to surgical treatment and stenting may also not be technically possible.  Vitals:   03/30/21 0327 03/30/21 0739 03/30/21 0939 03/30/21 1230  BP: (!) 164/102 (!) 143/60  112/74  Pulse: 94 80  97  Resp: 17 16  20   Temp: 98.6 F (37 C) 98.7 F (37.1 C)  98.1 F (36.7 C)  TempSrc: Oral Oral  Oral  SpO2: 97% 96% 96% 98%  Weight:      Height:       CBC:  Recent Labs  Lab 03/29/21 1213 03/29/21 1217 03/30/21 0331  WBC 6.4  --  7.0  NEUTROABS 4.3  --  5.0  HGB 10.8* 11.2* 10.1*  HCT 35.2* 33.0* 32.8*  MCV 88.7  --  87.2  PLT 325  --  774   Basic Metabolic Panel:  Recent Labs  Lab 03/29/21 1213 03/29/21 1217 03/30/21 0331  NA 139 139 139  K 4.2 5.0 3.8  CL 106 104 103  CO2 26  --  25  GLUCOSE 94 93 81  BUN 16 23 16   CREATININE 0.96 0.90 0.99  CALCIUM 9.2  --  9.9  MG  --   --  1.8  PHOS  --   --  3.7   Lipid Panel:  Recent Labs  Lab 03/30/21 0331  CHOL 99  TRIG 57  HDL 41  CHOLHDL 2.4  VLDL 11  LDLCALC 47   HgbA1c:  Recent Labs  Lab 03/30/21 0331  HGBA1C 5.8*   Urine Drug Screen:  Recent Labs  Lab 03/29/21 1208  LABOPIA NONE DETECTED  COCAINSCRNUR NONE DETECTED  LABBENZ NONE DETECTED  AMPHETMU NONE DETECTED  THCU NONE DETECTED  LABBARB NONE DETECTED    Alcohol Level  Recent Labs  Lab 03/29/21 1205  ETH <10    IMAGING past 24 hours CT Angio Head W or Wo Contrast  Result Date: 03/29/2021 CLINICAL  DATA:  vision abnormality, need contrast per neuro; Vision loss, monocular Left eye vision loss EXAM: CT ANGIOGRAPHY HEAD CT VENOGRAM HEAD TECHNIQUE: Multidetector CT imaging of the head was performed using the standard protocol during bolus administration of intravenous contrast. Multiplanar CT image reconstructions and MIPs were obtained to evaluate the vascular anatomy. Multidetector CT imaging of the head was performed using the venous protocol during bolus administration of intravenous contrast to assess the dural venous sinuses. Multiplanar CT image reconstructions and MIPs were obtained to evaluate the vascular anatomy. RADIATION DOSE REDUCTION: This exam was performed according to the departmental dose-optimization program which includes automated exposure control, adjustment of the mA and/or kV according to patient size and/or use of iterative reconstruction technique. CONTRAST:  30mL OMNIPAQUE IOHEXOL 350 MG/ML SOLN COMPARISON:  CTA Head Jul 12, 2015. FINDINGS: CT HEAD Brain: Remote infarcts in the anterior left temporal lobe, left frontal lobe, left parietal lobe and right temporal lobe. There  are some additional smaller cortical infarcts in the right frontal lobe. These appear similar. Additional patchy white matter hypoattenuation, nonspecific but compatible chronic microvascular ischemic disease. No evidence of acute large vascular territory infarct. No evidence of acute hemorrhage, mass lesion, midline shift, hydrocephalus. Vascular: See below. Skull: No acute fracture. Sinuses: Mild paranasal sinus mucosal thickening. CTA HEAD Anterior circulation: Occlusion the visualized left ICA to the carotid terminus. Left MCA is opacified, probably via patent left A1 ACA. Left MCA is small. Attenuated distal MCA vessels bilaterally in the regions of prior infarcts. The right intracranial ICA is patent with mild atherosclerotic narrowing. Right MCA and bilateral ACAs are patent without proximal high-grade  stenosis. Posterior circulation: Chronically occluded right intradural vertebral artery. Left vertebral artery. Mild irregularity and narrowing of the left vertebral artery, similar. Mild narrowing of the basilar artery. Right PCA is largely supplied by the right posterior communicating artery with small or absent right P1 PCA with probable superimposed P1 PCA stenosis, similar. Left posterior communicating artery remains patent. Severe stenosis of the left P2 PCA. Also, severe stenosis of the distal right P2 PCA. Venous sinuses: See below. Anatomic variants: Detailed above. Review of the MIP images confirms the above findings. CT VENOGRAM HEAD No evidence of dural venous sinus thrombosis. The superior sagittal sinus IMPRESSION: CT head: 1. Multiple bilateral remote infarcts, which appears similar by CT. No definite new/interval acute infarcts; however, MRI could provide more sensitive evaluation for acute infarct if clinically indicated. 2. Chronic microvascular ischemic disease. CTA head: 1. Age indeterminate occlusion of the left ICA with reconstitution at the carotid terminus via patent left A1 ACA. Left MCA is small, but opacified. Attenuated distal MCA vessels in regions of prior infarcts bilaterally. 2. Chronically occluded right vertebral artery. 3. Small/hypoplastic right P1 PCA with suspected superimposed severe stenosis. Also, severe stenosis of bilateral P2 PCAs. CT venogram: No evidence of dural venous sinus thrombosis. Findings discussed with Dr. Rogene Houston via telephone at 2:50 p.m. Electronically Signed   By: Margaretha Sheffield M.D.   On: 03/29/2021 15:12   MR BRAIN WO CONTRAST  Result Date: 03/29/2021 CLINICAL DATA:  Vision loss in left eye since Saturday, retinal vein occlusion diagnosed at the eye doctor, history of strokes EXAM: MRI HEAD WITHOUT CONTRAST TECHNIQUE: Multiplanar, multiecho pulse sequences of the brain and surrounding structures were obtained without intravenous contrast.  COMPARISON:  Brain MRI 11/24/2014, same-day CTA and CT venogram head FINDINGS: Brain: There is no evidence of acute intracranial hemorrhage, extra-axial fluid collection, or acute infarct. There is mild global parenchymal volume loss with prominence of the ventricular system and extra-axial CSF spaces. Remote infarcts are seen in the left anterior temporal lobe, left insula and frontal lobe, left parietal lobe, and right temporal lobe. A small remote infarct is seen in the pons. Additional foci of FLAIR signal abnormality in the subcortical and periventricular white matter likely reflects sequela of mild chronic white matter microangiopathy. There is no solid mass lesion.  There is no midline shift. Vascular: The left ICA flow void is absent, as seen on prior CTA. The left MCA flow void is present. The right V4 segment flow void is not identified. The other major flow voids are present. Skull and upper cervical spine: Normal marrow signal. Sinuses/Orbits: Paranasal sinuses are clear. Bilateral lens implants are in place. The globes and orbits are otherwise unremarkable. Other: There is a right mastoid effusion. IMPRESSION: 1. No acute intracranial pathology. 2. Multiple remote infarcts in the bilateral cerebral hemispheres as above. Electronically  Signed   By: Valetta Mole M.D.   On: 03/29/2021 18:43   DG Chest Port 1 View  Result Date: 03/29/2021 CLINICAL DATA:  History of chronic diastolic heart failure, lung disease, presents with left vision changes EXAM: PORTABLE CHEST 1 VIEW COMPARISON:  Chest radiograph 12/02/2019 FINDINGS: The heart is enlarged, unchanged. The upper mediastinal contours are within normal limits. There are patchy retrocardiac opacities. There is no other focal consolidation or pulmonary edema. There is no pleural effusion or pneumothorax. There is a moderate-to-large hiatal hernia. There is no acute osseous abnormality. IMPRESSION: 1. Retrocardiac opacities may reflect atelectasis,  infection, or aspiration. Consider PA and lateral chest radiographs for better evaluation. 2. Cardiomegaly. 3. Moderate to large hiatal hernia. Electronically Signed   By: Valetta Mole M.D.   On: 03/29/2021 20:30   ECHOCARDIOGRAM COMPLETE  Result Date: 03/30/2021    ECHOCARDIOGRAM REPORT   Patient Name:   DESI ROWE Date of Exam: 03/30/2021 Medical Rec #:  [id number]     Height:       65.0 in Accession #:    [id number]    Weight:       189.6 lb Date of Birth:  05/28/1945    BSA:          1.934 m Patient Age:    31 years      BP:           143/60 mmHg Patient Gender: M             HR:           80 bpm. Exam Location:  Inpatient Procedure: 2D Echo Indications:    Stroke  History:        Patient has prior history of Echocardiogram examinations, most                 recent 12/19/2019. CAD, TIA; Risk Factors:Hypertension.  Sonographer:    Arlyss Gandy Referring Phys: [id number] [place]  1. Left ventricular ejection fraction, by estimation, is 60 to 65%. The left ventricle has normal function. The left ventricle has no regional wall motion abnormalities. Left ventricular diastolic parameters were normal.  2. Right ventricular systolic function is normal. The right ventricular size is normal. There is normal pulmonary artery systolic pressure. The estimated right ventricular systolic pressure is 35.5 mmHg.  3. The mitral valve is grossly normal. Trivial mitral valve regurgitation. No evidence of mitral stenosis.  4. The aortic valve is tricuspid. There is mild calcification of the aortic valve. Aortic valve regurgitation is not visualized. No aortic stenosis is present.  5. The inferior vena cava is normal in size with greater than 50% respiratory variability, suggesting right atrial pressure of 3 mmHg. Comparison(s): No significant change from prior study. Normal MV tissue doppler. Diastolic parameters are normal. Conclusion(s)/Recommendation(s): Normal biventricular function without evidence of  hemodynamically significant valvular heart disease. No intracardiac source of embolism detected on this transthoracic study. Consider a transesophageal echocardiogram to exclude cardiac source of embolism if clinically indicated. FINDINGS  Left Ventricle: Left ventricular ejection fraction, by estimation, is 60 to 65%. The left ventricle has normal function. The left ventricle has no regional wall motion abnormalities. The left ventricular internal cavity size was normal in size. There is  no left ventricular hypertrophy. Left ventricular diastolic parameters were normal. Right Ventricle: The right ventricular size is normal. No increase in right ventricular wall thickness. Right ventricular systolic function is normal. There is normal pulmonary artery systolic pressure. The  tricuspid regurgitant velocity is 1.69 m/s, and  with an assumed right atrial pressure of 3 mmHg, the estimated right ventricular systolic pressure is 02.5 mmHg. Left Atrium: Left atrial size was normal in size. Right Atrium: Right atrial size was normal in size. Pericardium: Trivial pericardial effusion is present. Mitral Valve: The mitral valve is grossly normal. Trivial mitral valve regurgitation. No evidence of mitral valve stenosis. Tricuspid Valve: The tricuspid valve is grossly normal. Tricuspid valve regurgitation is trivial. No evidence of tricuspid stenosis. Aortic Valve: The aortic valve is tricuspid. There is mild calcification of the aortic valve. Aortic valve regurgitation is not visualized. No aortic stenosis is present. Aortic valve mean gradient measures 6.0 mmHg. Aortic valve peak gradient measures 10.2 mmHg. Aortic valve area, by VTI measures 2.11 cm. Pulmonic Valve: The pulmonic valve was grossly normal. Pulmonic valve regurgitation is not visualized. No evidence of pulmonic stenosis. Aorta: The aortic root and ascending aorta are structurally normal, with no evidence of dilitation. Venous: The inferior vena cava is normal  in size with greater than 50% respiratory variability, suggesting right atrial pressure of 3 mmHg. IAS/Shunts: The interatrial septum appears to be lipomatous. The atrial septum is grossly normal.  LEFT VENTRICLE PLAX 2D LVIDd:         4.00 cm   Diastology LVIDs:         2.40 cm   LV e' medial:    7.62 cm/s LV PW:         1.10 cm   LV E/e' medial:  11.7 LV IVS:        1.10 cm   LV e' lateral:   9.68 cm/s LVOT diam:     2.00 cm   LV E/e' lateral: 9.2 LV SV:         70 LV SV Index:   36 LVOT Area:     3.14 cm  RIGHT VENTRICLE             IVC RV Basal diam:  3.50 cm     IVC diam: 1.90 cm RV Mid diam:    3.50 cm RV S prime:     11.50 cm/s TAPSE (M-mode): 1.7 cm LEFT ATRIUM             Index        RIGHT ATRIUM           Index LA diam:        3.70 cm 1.91 cm/m   RA Area:     17.00 cm LA Vol (A2C):   52.0 ml 26.89 ml/m  RA Volume:   45.90 ml  23.74 ml/m LA Vol (A4C):   52.6 ml 27.20 ml/m LA Biplane Vol: 53.0 ml 27.41 ml/m  AORTIC VALVE AV Area (Vmax):    2.18 cm AV Area (Vmean):   1.97 cm AV Area (VTI):     2.11 cm AV Vmax:           160.00 cm/s AV Vmean:          110.000 cm/s AV VTI:            0.331 m AV Peak Grad:      10.2 mmHg AV Mean Grad:      6.0 mmHg LVOT Vmax:         111.00 cm/s LVOT Vmean:        69.100 cm/s LVOT VTI:          0.222 m LVOT/AV VTI ratio: 0.67  AORTA Ao Root  diam: 3.00 cm Ao Asc diam:  3.30 cm MITRAL VALVE               TRICUSPID VALVE MV Area (PHT): 3.39 cm    TR Peak grad:   11.4 mmHg MV Decel Time: 224 msec    TR Vmax:        169.00 cm/s MV E velocity: 89.50 cm/s MV A velocity: 76.30 cm/s  SHUNTS MV E/A ratio:  1.17        Systemic VTI:  0.22 m                            Systemic Diam: 2.00 cm Eleonore Chiquito MD Electronically signed by Eleonore Chiquito MD Signature Date/Time: 03/30/2021/9:46:44 AM    Final    CT VENOGRAM HEAD  Result Date: 03/29/2021 CLINICAL DATA:  vision abnormality, need contrast per neuro; Vision loss, monocular Left eye vision loss EXAM: CT ANGIOGRAPHY HEAD CT  VENOGRAM HEAD TECHNIQUE: Multidetector CT imaging of the head was performed using the standard protocol during bolus administration of intravenous contrast. Multiplanar CT image reconstructions and MIPs were obtained to evaluate the vascular anatomy. Multidetector CT imaging of the head was performed using the venous protocol during bolus administration of intravenous contrast to assess the dural venous sinuses. Multiplanar CT image reconstructions and MIPs were obtained to evaluate the vascular anatomy. RADIATION DOSE REDUCTION: This exam was performed according to the departmental dose-optimization program which includes automated exposure control, adjustment of the mA and/or kV according to patient size and/or use of iterative reconstruction technique. CONTRAST:  53mL OMNIPAQUE IOHEXOL 350 MG/ML SOLN COMPARISON:  CTA Head Jul 12, 2015. FINDINGS: CT HEAD Brain: Remote infarcts in the anterior left temporal lobe, left frontal lobe, left parietal lobe and right temporal lobe. There are some additional smaller cortical infarcts in the right frontal lobe. These appear similar. Additional patchy white matter hypoattenuation, nonspecific but compatible chronic microvascular ischemic disease. No evidence of acute large vascular territory infarct. No evidence of acute hemorrhage, mass lesion, midline shift, hydrocephalus. Vascular: See below. Skull: No acute fracture. Sinuses: Mild paranasal sinus mucosal thickening. CTA HEAD Anterior circulation: Occlusion the visualized left ICA to the carotid terminus. Left MCA is opacified, probably via patent left A1 ACA. Left MCA is small. Attenuated distal MCA vessels bilaterally in the regions of prior infarcts. The right intracranial ICA is patent with mild atherosclerotic narrowing. Right MCA and bilateral ACAs are patent without proximal high-grade stenosis. Posterior circulation: Chronically occluded right intradural vertebral artery. Left vertebral artery. Mild irregularity  and narrowing of the left vertebral artery, similar. Mild narrowing of the basilar artery. Right PCA is largely supplied by the right posterior communicating artery with small or absent right P1 PCA with probable superimposed P1 PCA stenosis, similar. Left posterior communicating artery remains patent. Severe stenosis of the left P2 PCA. Also, severe stenosis of the distal right P2 PCA. Venous sinuses: See below. Anatomic variants: Detailed above. Review of the MIP images confirms the above findings. CT VENOGRAM HEAD No evidence of dural venous sinus thrombosis. The superior sagittal sinus IMPRESSION: CT head: 1. Multiple bilateral remote infarcts, which appears similar by CT. No definite new/interval acute infarcts; however, MRI could provide more sensitive evaluation for acute infarct if clinically indicated. 2. Chronic microvascular ischemic disease. CTA head: 1. Age indeterminate occlusion of the left ICA with reconstitution at the carotid terminus via patent left A1 ACA. Left MCA is small, but opacified. Attenuated distal  MCA vessels in regions of prior infarcts bilaterally. 2. Chronically occluded right vertebral artery. 3. Small/hypoplastic right P1 PCA with suspected superimposed severe stenosis. Also, severe stenosis of bilateral P2 PCAs. CT venogram: No evidence of dural venous sinus thrombosis. Findings discussed with Dr. Rogene Houston via telephone at 2:50 p.m. Electronically Signed   By: Margaretha Sheffield M.D.   On: 03/29/2021 15:12   VAS US CAROTID  Result Date: 03/30/2021 Carotid Arterial Duplex Study Patient Name:  MATTY VANROEKEL [facility]  Date of Exam:   03/30/2021 Medical Rec #: [id number]      Accession #:    [id number] Date of Birth: 1945/07/10     Patient Gender: M Patient Age:   75 years Exam Location:  [facility] Procedure:      VAS US CAROTID Referring Phys: Elwin Sleight DE [place] TOR[place] --------------------------------------------------------------------------------  Indications:       TIA. Risk Factors:       Hypertension, hyperlipidemia. Limitations        Today's exam was limited due to anatomy, depth of vessels,                    tortuosity. Comparison Study:  No prior study Performing Technologist: Maudry Mayhew MHA, RDMS, RVT, RDCS  Examination Guidelines: A complete evaluation includes B-mode imaging, spectral Doppler, color Doppler, and power Doppler as needed of all accessible portions of each vessel. Bilateral testing is considered an integral part of a complete examination. Limited examinations for reoccurring indications may be performed as noted.  Right Carotid Findings: +----------+--------+--------+--------+---------------------+------------------+             PSV cm/s EDV cm/s Stenosis Plaque Description    Comments            +----------+--------+--------+--------+---------------------+------------------+  CCA Prox   92       29                                                          +----------+--------+--------+--------+---------------------+------------------+  CCA Distal 101      37                                      intimal thickening  +----------+--------+--------+--------+---------------------+------------------+  ICA Prox   94       33                smooth, heterogenous  tortuous                                                   and calcific                              +----------+--------+--------+--------+---------------------+------------------+  ICA Mid    134      49                                      tortuous            +----------+--------+--------+--------+---------------------+------------------+  ICA Distal 111      41                                      tortuous            +----------+--------+--------+--------+---------------------+------------------+  ECA        134      14                                                          +----------+--------+--------+--------+---------------------+------------------+  +----------+--------+-------+----------------+-------------------+             PSV cm/s EDV cms Describe         Arm Pressure (mmHG)  +----------+--------+-------+----------------+-------------------+  Subclavian 108              Multiphasic, WNL                      +----------+--------+-------+----------------+-------------------+ +---------+--------+--------+---------+  Vertebral PSV cm/s EDV cm/s Antegrade  +---------+--------+--------+---------+  Left Carotid Findings: +----------+--------+--------+--------+--------------------------+--------+             PSV cm/s EDV cm/s Stenosis Plaque Description         Comments  +----------+--------+--------+--------+--------------------------+--------+  CCA Prox   74       6                                                      +----------+--------+--------+--------+--------------------------+--------+  CCA Distal 68       12                irregular and heterogenous           +----------+--------+--------+--------+--------------------------+--------+  ICA Prox   35       9                                                      +----------+--------+--------+--------+--------------------------+--------+  ICA Distal 33       9                                                      +----------+--------+--------+--------+--------------------------+--------+  ECA        56       8                                                      +----------+--------+--------+--------+--------------------------+--------+ +----------+--------+--------+----------------+-------------------+             PSV cm/s EDV cm/s Describe         Arm Pressure (mmHG)  +----------+--------+--------+----------------+-------------------+  Subclavian 149  Multiphasic, WNL                      +----------+--------+--------+----------------+-------------------+ +---------+--------+--+--------+--+---------+  Vertebral PSV cm/s 51 EDV cm/s 16 Antegrade  +---------+--------+--+--------+--+---------+    Summary: Right Carotid: The extracranial vessels were near-normal with only mild wall                thickening or plaque. Left Carotid: The extracranial vessels were near-normal with only mild wall               thickening or plaque. Vertebrals:  Bilateral vertebral arteries demonstrate antegrade flow. Subclavians: Normal flow hemodynamics were seen in bilateral subclavian              arteries. *See table(s) above for measurements and observations.     Preliminary     PHYSICAL EXAM General:  Alert, well-developed, well-nourished elderly Caucasian patient in no acute distress   NEURO:  Mental Status: AA&Ox3  Speech/Language: speech is without dysarthria or aphasia.    Cranial Nerves:  II: PERRL. Large central scotoma in left eye with ability to perceive light in the periphery III, IV, VI: EOMI.  V: Sensation is intact to light touch and symmetrical to face.  VII: Smile is symmetrical.  VIII: hearing diminished bilaterally. IX, X: Phonation is normal.  XII: tongue is midline without fasciculations. Motor: 5/5 strength to all muscle groups tested.  Sensation- Intact to light touch bilaterally.    Gait- deferred    ASSESSMENT/PLAN [name] is a 76 y.o. male with history of multiple TIAs and strokes, amaurosis fugax, HLD, cancer of tongue and larynx, HTN and carotid stenosis presenting after he lost vision in his left eye last night (he has had multiple episodes of amaurosis fugax involving the left eye in the past).  His vision did improve to the point where he now has a large central scotoma but can see light in the periphery of his vision.  He has had multiple strokes and TIAs in the past, starting at the age of 89.  He did have an angiogram in 2016 which revealed 60% stenosis of the mid segment of his left ICA.  This was not felt to be amenable to surgical treatment and stenting may also not be technically possible.  Central retinal artery occlusion:  left with partial improvement  likely from chronic left internal carotid artery occlusion with failure of collateral to ophthalmic CT head Multiple remote infarcts. Small vessel disease.   CTA head & neck left ICA occlusion with reconstitution at carotid terminus, occluded right vertebral artery and stenosis of P2 PCAs MRI  No acute intracranial pathology, multiple remote infarcts Carotid Doppler  only mild thickening or plaque in extracranial vessels 2D Echo EF 60-65%, lipomatous interatrial septum CT venogram: no evidence of dural venous sinus thrombosis LDL 47 HgbA1c 5.8 VTE prophylaxis - lovenox    Diet   Diet Heart Room service appropriate? Yes; Fluid consistency: Thin   aspirin 81 mg daily and clopidogrel 75 mg daily prior to admission, now on aspirin 81 mg daily and clopidogrel 75 mg daily.  Therapy recommendations:  outpatient OT Disposition:  pending  Hypertension Home meds:  losartan 50 mg daily Stable Permissive hypertension (OK if < 220/120) but gradually normalize in 5-7 days Long-term BP goal normotensive  Hyperlipidemia Home meds:  atorvastatin 80 mg daily, resumed in hospital LDL 47, goal < 70 Continue statin at discharge   Other Stroke Risk Factors Advanced Age >/= 54  Former cigarette smoker Obesity, Body mass index is 31.55 kg/m., BMI >/= 30 associated with increased stroke risk, recommend weight loss, diet and exercise as appropriate  Hx stroke and TIA  Other Active Problems Cancer of tongue and larynx Follow up with outpatient oncologist  Hypothyroidism Continue home [facility] day # [place] , MSN, AGACNP-BC [facility] See Amion for schedule and pager information 03/30/2021 1:34 PM  STROKE MD NOTE :  I have personally obtained history,examined this patient, reviewed notes, independently viewed imaging studies, participated in medical decision making and plan of care.ROS completed by me personally and pertinent positives fully documented   I have made any additions or clarifications directly to the above note. Agree with note above.  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  Patient and wife informed me that he has known history of carotid stenosis in the past in that region and was told he was not a candidate for surgery or endovascular intervention so the exact etiology for this occlusion is age-indeterminate and she will not proceed with aggressive evaluation.  Recommend dual antiplatelet therapy of aspirin and Brilinta for 3 months followed by aspirin alone.  Aggressive risk factor modification.  Long discussion patient and wife at the bedside and answered questions.  Discussed with Dr. Georgina Quint.  Greater than 50% time during this 50-minute visit was spent in counseling and coordination of care about his vision loss and carotid occlusion and answered questions.  Case also discussed with interventional neuroradiologist Dr. Aida Puffer, MD Medical Director [place] Pager: [phone] 03/30/2021 4:57 PM   To contact Stroke Continuity provider, please refer to [email or web address] After hours, contact General Neurology

## 2021-03-30 NOTE — TOC Transition Note (Addendum)
Transition of Care [facility]) - CM/SW Discharge Note   Patient Details  Name: LEVIN DAGOSTINO MRN: [id number] Date of Birth: 12-24-1945  Transition of Care [facility]) CM/SW Contact:  Pollie Friar, RN Phone Number: 03/30/2021, 4:04 PM   Clinical Narrative:    Remigio Eisenmenger is from home with family. He has needed supervision. Recommendations for outpatient OT. Patient prefers to attend in [facility]. Referral sent to [facility]: [phone]. Information on the AVS. Family denies any medication issues or transportation issues.  CM provided family 30 day free coupon for his brilinta.     Final next level of care: OP Rehab Barriers to Discharge: No Barriers Identified   Patient Goals and CMS Choice   CMS Medicare.gov Compare Post Acute Care list provided to:: Patient Choice offered to / list presented to : Patient, Spouse  Discharge Placement                       Discharge Plan and Services                                     Social Determinants of Health (SDOH) Interventions     Readmission Risk Interventions No flowsheet data found.

## 2021-03-30 NOTE — Telephone Encounter (Signed)
Retina branch blocked per ophthalmologist- refer to ER

## 2021-03-30 NOTE — Progress Notes (Signed)
Occupational Therapy Treatment Patient Details Name: James Richards MRN: [id number] DOB: [birth year] Today's Date: 03/30/2021      OT comments  OT evaluation completed. Recommend d/c to home with neruo OP OT follow up to address vision changes. OT to follow acutely. Formal note to follow.      Leiann Sporer A Benedetta Sundstrom 03/30/2021, 11:24 AM

## 2021-03-31 ENCOUNTER — Other Ambulatory Visit: Payer: Self-pay | Admitting: Legal Medicine

## 2021-03-31 ENCOUNTER — Encounter: Payer: Self-pay | Admitting: Legal Medicine

## 2021-03-31 DIAGNOSIS — H3412 Central retinal artery occlusion, left eye: Secondary | ICD-10-CM | POA: Insufficient documentation

## 2021-03-31 DIAGNOSIS — I6522 Occlusion and stenosis of left carotid artery: Secondary | ICD-10-CM | POA: Insufficient documentation

## 2021-04-07 ENCOUNTER — Ambulatory Visit (INDEPENDENT_AMBULATORY_CARE_PROVIDER_SITE_OTHER): Payer: Medicare PPO | Admitting: Legal Medicine

## 2021-04-07 ENCOUNTER — Encounter: Payer: Self-pay | Admitting: Legal Medicine

## 2021-04-07 ENCOUNTER — Other Ambulatory Visit: Payer: Self-pay

## 2021-04-07 DIAGNOSIS — I5032 Chronic diastolic (congestive) heart failure: Secondary | ICD-10-CM | POA: Diagnosis not present

## 2021-04-07 DIAGNOSIS — I110 Hypertensive heart disease with heart failure: Secondary | ICD-10-CM | POA: Diagnosis not present

## 2021-04-07 DIAGNOSIS — I70213 Atherosclerosis of native arteries of extremities with intermittent claudication, bilateral legs: Secondary | ICD-10-CM | POA: Diagnosis not present

## 2021-04-07 DIAGNOSIS — H3412 Central retinal artery occlusion, left eye: Secondary | ICD-10-CM

## 2021-04-07 NOTE — Assessment & Plan Note (Signed)
He has some sight in left eye.

## 2021-04-07 NOTE — Progress Notes (Signed)
Subjective:  Patient ID: James Richards, male    DOB: [birth year]  Age: 76 y.o. MRN: [id number]  Chief Complaint  Patient presents with   Loss of Vision    Left eye   Transitions Of Care    HPI  Patient is here for transition of care. He went to [facility] on 03/29/2021 to 03/30/2021 for loss of vision on left eye. He went to see eye doctor and they sent to ED. Diagnosed in the hospital was vision exam with abnormal findings concern for left retinal artery Occlusion. He has multiple old infarcts and several arteries occluded, no candidate for full anticoagulation per neurology. 2 D echo was normal.  They prescribed Brillinta, he is unable to tolerate, so stopped.  He is not driving. Current Outpatient Medications on File Prior to Visit  Medication Sig Dispense Refill   acetaminophen-codeine (TYLENOL #3) 300-30 MG tablet Take 1 tablet by mouth every 4 (four) hours as needed for moderate pain. 90 tablet 0   aspirin EC 81 MG tablet Take 81 mg by mouth daily.     atorvastatin (LIPITOR) 80 MG tablet TAKE 1 TABLET EVERY DAY (Patient taking differently: Take 80 mg by mouth daily.) 90 tablet 2   furosemide (LASIX) 40 MG tablet TAKE 1 TABLET EVERY DAY (Patient taking differently: Take 40 mg by mouth daily.) 90 tablet 3   levothyroxine (SYNTHROID) 100 MCG tablet Take 100 mcg by mouth daily.     losartan (COZAAR) 50 MG tablet TAKE 1 TABLET EVERY DAY (Patient taking differently: Take 50 mg by mouth daily.) 90 tablet 3   minocycline (MINOCIN) 50 MG capsule TAKE 1 CAPSULE EVERY 12 HOURS (Patient taking differently: Take 50 mg by mouth daily.) 180 capsule 2   Omega-3 Fatty Acids (FISH OIL) 1200 MG CPDR Take 1 capsule by mouth daily.     omeprazole (PRILOSEC) 40 MG capsule TAKE 1 CAPSULE EVERY DAY (Patient taking differently: Take 40 mg by mouth daily.) 90 capsule 2   rOPINIRole (REQUIP) 0.5 MG tablet TAKE 1 TABLET AT BEDTIME (Patient taking differently: Take 0.5 mg by mouth at bedtime as needed  (For restless legs).) 90 tablet 2   senna-docusate (SENOKOT-S) 8.6-50 MG tablet Take 1 tablet by mouth at bedtime as needed for mild constipation. 30 tablet 0   TRELEGY ELLIPTA 100-62.5-25 MCG/ACT AEPB Take 1 puff by mouth daily.     No current facility-administered medications on file prior to visit.   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   History of recurrent TIAs 12/11/2014   Impacted cerumen 11/26/2019   Malignant neoplasm of base of tongue ([place]) 01/21/2015   Malignant neoplasm of larynx, unspecified (HCC)    Migraine 04/02/2014   Mixed hyperlipidemia    Other fatigue    Other specified hypothyroidism    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    Restless legs syndrome    Restrictive lung disease 11/26/2019   TIA (transient ischemic attack) 11/24/2014   Past Surgical History:  Procedure Laterality Date   LYMPHADENECTOMY     PERIPHERAL VASCULAR CATHETERIZATION N/A 11/24/2014   Procedure: Aortic Arch Angiography;  Surgeon: Angelia Mould, MD;  Location: [place] [facility];  Service: Cardiovascular;  Laterality: N/A;   PERIPHERAL VASCULAR CATHETERIZATION N/A 11/24/2014   Procedure: Cerebral Angiography;  Surgeon: Angelia Mould, MD;  Location: [place] [facility];  Service: Cardiovascular;  Laterality: N/A;   THROAT SURGERY     TONSILECTOMY/ADENOIDECTOMY WITH MYRINGOTOMY      Family History  Problem Relation Age of Onset   Other Father        amputation   Deep vein thrombosis Father    Heart disease Father        Atrial Fib.   Cancer Sister    Hypertension Brother    Social  History   Socioeconomic History   Marital status: Divorced    Spouse name: Not on file   Number of children: Not on file   Years of education: Not on file   Highest education level: Not on file  Occupational History   Not on file  Tobacco Use   Smoking status: Former    Packs/day: 1.00    Years: 41.00    Pack years: 41.00    Types: Cigarettes    Quit date: 02/15/2007    Years since quitting: 14.1   Smokeless tobacco: Former    Types: Nurse, children's Use: Never used  Substance and Sexual Activity   Alcohol use: No    Alcohol/week: 0.0 standard drinks   Drug use: No   Sexual activity: Not Currently    Partners: Female  Other Topics Concern   Not on file  Social History Narrative   Not on file   Social Determinants of Health   Financial Resource Strain: Not on file  Food Insecurity: Not on file  Transportation Needs: Not on file  Physical Activity: Not on file  Stress: Not on file  Social Connections: Not on file    Review of Systems  Constitutional:  Negative for chills, fatigue, fever and unexpected weight change.  HENT:  Negative for congestion, ear pain, sinus pain and sore throat.   Eyes:  Positive for visual disturbance (left eye).  Respiratory:  Positive for shortness of breath. Negative for cough.   Cardiovascular:  Negative for chest pain and palpitations.  Gastrointestinal:  Negative for abdominal pain, blood in stool, constipation, diarrhea, nausea and vomiting.  Endocrine: Negative for polydipsia.  Genitourinary:  Negative for dysuria.  Musculoskeletal:  Negative for back pain.  Skin:  Negative for rash.  Neurological:  Negative for headaches.    Objective:  BP 130/80    Pulse 71    Temp 98 F (36.7 C)    Resp 15    Ht 5\' 5"  (1.651 m)    Wt 195 lb (88.5 kg)    SpO2 97%    BMI 32.45 kg/m   BP/Weight 04/07/2021 03/30/2021 05/12/9240  Systolic BP 683 419 -  Diastolic BP 80 71 -  Wt. (Lbs) 195 189.6 -  BMI 32.45 - 31.55    Physical  Exam Vitals reviewed.  Constitutional:      General: He is in acute distress.     Appearance: Normal appearance. He is obese.  HENT:     Head: Normocephalic.     Right Ear: Tympanic membrane normal.     Left Ear: Tympanic membrane normal.     Mouth/Throat:     Mouth: Mucous membranes are moist.  Eyes:     Extraocular Movements: Extraocular movements intact.     Conjunctiva/sclera: Conjunctivae normal.     Pupils: Pupils are equal, round, and reactive to  light.  Cardiovascular:     Rate and Rhythm: Normal rate and regular rhythm.     Pulses: Normal pulses.     Heart sounds: Normal heart sounds. No murmur heard.   No gallop.  Pulmonary:     Effort: Pulmonary effort is normal. No respiratory distress.     Breath sounds: Normal breath sounds. No wheezing.  Abdominal:     General: Abdomen is flat. Bowel sounds are normal. There is no distension.     Palpations: Abdomen is soft.     Tenderness: There is no abdominal tenderness.  Musculoskeletal:     Cervical back: Normal range of motion.     Right lower leg: No edema.     Left lower leg: No edema.  Skin:    General: Skin is warm.     Capillary Refill: Capillary refill takes less than 2 seconds.  Neurological:     General: No focal deficit present.     Mental Status: He is alert and oriented to person, place, and time.     Sensory: Sensory deficit present.     Gait: Gait normal.     Deep Tendon Reflexes: Reflexes normal.     Comments: Visual field defects centra left eye        Lab Results  Component Value Date   WBC 7.0 03/30/2021   HGB 10.1 (L) 03/30/2021   HCT 32.8 (L) 03/30/2021   PLT 317 03/30/2021   GLUCOSE 81 03/30/2021   CHOL 99 03/30/2021   TRIG 57 03/30/2021   HDL 41 03/30/2021   LDLCALC 47 03/30/2021   ALT 25 03/30/2021   AST 28 03/30/2021   NA 139 03/30/2021   K 3.8 03/30/2021   CL 103 03/30/2021   CREATININE 0.99 03/30/2021   BUN 16 03/30/2021   CO2 25 03/30/2021   TSH 18.800 (H) 12/16/2020    INR 1.0 03/29/2021   HGBA1C 5.8 (H) 03/30/2021      Assessment & Plan:   Problem List Items Addressed This Visit       Cardiovascular and Mediastinum   Atherosclerosis of native artery of extremity with intermittent claudication ([place]) Patient's peripheral artery disease has been treated and he is not having any further claudication symptoms.    Hypertensive heart disease with chronic diastolic congestive heart failure ([place]) Patient has normal blood pressure and has no evidence for congestive heart failure symptoms he is otherwise stable cardiac wise.    Central retinal artery occlusion of left eye    He has some sight in left eye. Patient did have central retinal artery occlusion in the left eye was admitted to the hospital stabilized he was noted to have multiple small old infarcts and diffuse disease of the cerebral circulation he is not a surgical candidate and was felt that full anticoagulation was not appropriate per neurologist.  He tried to take the [place] but he gave him adverse side effects so he stopped it we will keep him off it.     .       Follow-up: Return as scheduled.  An After Visit Summary was printed and given to the patient.  Reinaldo Meeker, MD [facility] [phone]

## 2021-04-22 ENCOUNTER — Encounter: Payer: Self-pay | Admitting: Legal Medicine

## 2021-04-22 ENCOUNTER — Ambulatory Visit (INDEPENDENT_AMBULATORY_CARE_PROVIDER_SITE_OTHER): Payer: Medicare PPO | Admitting: Legal Medicine

## 2021-04-22 ENCOUNTER — Other Ambulatory Visit: Payer: Self-pay

## 2021-04-22 VITALS — BP 136/90 | HR 52 | Temp 98.1°F | Resp 15 | Ht 65.0 in | Wt 196.0 lb

## 2021-04-22 DIAGNOSIS — G2581 Restless legs syndrome: Secondary | ICD-10-CM

## 2021-04-22 DIAGNOSIS — I5032 Chronic diastolic (congestive) heart failure: Secondary | ICD-10-CM | POA: Diagnosis not present

## 2021-04-22 DIAGNOSIS — E782 Mixed hyperlipidemia: Secondary | ICD-10-CM

## 2021-04-22 DIAGNOSIS — E038 Other specified hypothyroidism: Secondary | ICD-10-CM

## 2021-04-22 DIAGNOSIS — H906 Mixed conductive and sensorineural hearing loss, bilateral: Secondary | ICD-10-CM

## 2021-04-22 DIAGNOSIS — I110 Hypertensive heart disease with heart failure: Secondary | ICD-10-CM

## 2021-04-22 DIAGNOSIS — H3412 Central retinal artery occlusion, left eye: Secondary | ICD-10-CM

## 2021-04-22 DIAGNOSIS — I70213 Atherosclerosis of native arteries of extremities with intermittent claudication, bilateral legs: Secondary | ICD-10-CM

## 2021-04-22 MED ORDER — FUROSEMIDE 40 MG PO TABS: 40.0000 mg | ORAL_TABLET | ORAL | 2 refills | Status: DC

## 2021-04-22 MED ORDER — ROPINIROLE HCL 2 MG PO TABS: 2.0000 mg | ORAL_TABLET | Freq: Every day | ORAL | 2 refills | Status: DC

## 2021-04-22 NOTE — Progress Notes (Signed)
Subjective:  Patient ID: James Richards, male    DOB: [birth year]  Age: 76 y.o. MRN: [id number]  Chief Complaint  Patient presents with   Hypothyroidism   Hypertension    HPI Hyperlipidemia: Patient is taking atorvastatin 80 mg daily, Fish Oil 1200 mg daily. Patient presents with hyperlipidemia.  Compliance with treatment has been good; patient takes medicines as directed, maintains low cholesterol diet, follows up as directed, and maintains exercise regimen.  Patient is using atorvastatin without problems.   Hypothyroidism: He is taking levothyroxine 100 mcg daily.   Hypertension: Currently on Losartan 50 mg daily, Aspirin 81 mg daily, furosemide 40 mg daily. Patient presents for follow up of hypertension.  Patient tolerating losartan well with side effects.  Patient was diagnosed with hypertension 2010 so has been treated for hypertension for 12 years.Patient is working on maintaining diet and exercise regimen and follows up as directed. Complication include none.   Laryngeal cancer is stable.  His eye sight is stable.  Current Outpatient Medications on File Prior to Visit  Medication Sig Dispense Refill   acetaminophen-codeine (TYLENOL #3) 300-30 MG tablet Take 1 tablet by mouth every 4 (four) hours as needed for moderate pain. 90 tablet 0   aspirin EC 81 MG tablet Take 81 mg by mouth daily.     atorvastatin (LIPITOR) 80 MG tablet TAKE 1 TABLET EVERY DAY (Patient taking differently: Take 80 mg by mouth daily.) 90 tablet 2   levothyroxine (SYNTHROID) 100 MCG tablet Take 100 mcg by mouth daily.     losartan (COZAAR) 50 MG tablet TAKE 1 TABLET EVERY DAY (Patient taking differently: Take 50 mg by mouth daily.) 90 tablet 3   minocycline (MINOCIN) 50 MG capsule TAKE 1 CAPSULE EVERY 12 HOURS (Patient taking differently: Take 50 mg by mouth daily.) 180 capsule 2   Omega-3 Fatty Acids (FISH OIL) 1200 MG CPDR Take 1 capsule by mouth daily.     omeprazole (PRILOSEC) 40 MG capsule TAKE 1  CAPSULE EVERY DAY (Patient taking differently: Take 40 mg by mouth daily.) 90 capsule 2   senna-docusate (SENOKOT-S) 8.6-50 MG tablet Take 1 tablet by mouth at bedtime as needed for mild constipation. 30 tablet 0   TRELEGY ELLIPTA 100-62.5-25 MCG/ACT AEPB Take 1 puff by mouth daily.     No current facility-administered medications on file prior to visit.   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   History of recurrent TIAs 12/11/2014   Impacted cerumen 11/26/2019   Malignant neoplasm of base of tongue ([place]) 01/21/2015   Malignant neoplasm of larynx, unspecified (HCC)    Migraine 04/02/2014   Mixed hyperlipidemia    Other fatigue    Other specified hypothyroidism    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    Restless legs syndrome    Restrictive lung disease 11/26/2019   TIA (transient ischemic attack) 11/24/2014   Vision exam with abnormal findings concern for left retinal artery occlusion 03/29/2021   Past Surgical History:  Procedure Laterality Date   LYMPHADENECTOMY     PERIPHERAL VASCULAR CATHETERIZATION N/A 11/24/2014   Procedure: Aortic Arch Angiography;  Surgeon: Angelia Mould, MD;  Location: [place] [facility];  Service: Cardiovascular;  Laterality: N/A;   PERIPHERAL VASCULAR CATHETERIZATION N/A 11/24/2014   Procedure: Cerebral Angiography;  Surgeon: Angelia Mould, MD;  Location: [place] [facility];  Service: Cardiovascular;  Laterality: N/A;   THROAT SURGERY     TONSILECTOMY/ADENOIDECTOMY WITH MYRINGOTOMY      Family History  Problem Relation Age of Onset   Other Father         amputation   Deep vein thrombosis Father    Heart disease Father        Atrial Fib.   Cancer Sister    Hypertension Brother    Social History   Socioeconomic History   Marital status: Divorced    Spouse name: Not on file   Number of children: Not on file   Years of education: Not on file   Highest education level: Not on file  Occupational History   Not on file  Tobacco Use   Smoking status: Former    Packs/day: 1.00    Years: 41.00    Pack years: 41.00    Types: Cigarettes    Quit date: 02/15/2007    Years since quitting: 14.1   Smokeless tobacco: Former    Types: Nurse, children's Use: Never used  Substance and Sexual Activity   Alcohol use: No    Alcohol/week: 0.0 standard drinks   Drug use: No   Sexual activity: Not Currently    Partners: Female  Other Topics Concern   Not on file  Social History Narrative   Not on file   Social Determinants of Health   Financial Resource Strain: Not on file  Food Insecurity: Not on file  Transportation Needs: Not on file  Physical Activity: Not on file  Stress: Not on file  Social Connections: Not on file    Review of Systems  Constitutional:  Negative for chills, fatigue, fever and unexpected weight change.  HENT:  Negative for congestion, ear pain, sinus pain and sore throat.   Respiratory:  Positive for shortness of breath (exertion). Negative for cough.   Cardiovascular:  Negative for chest pain and palpitations.  Gastrointestinal:  Negative for abdominal pain, blood in stool, constipation, diarrhea, nausea and vomiting.  Endocrine: Negative for polydipsia.  Genitourinary:  Negative for dysuria.  Musculoskeletal:  Negative for back pain.  Skin:  Negative for rash.  Neurological:  Negative for headaches.    Objective:  BP 136/90    Pulse (!) 52    Temp 98.1 F (36.7 C)    Resp 15    Ht '5\' 5"'$  (1.651 m)    Wt 196 lb (88.9 kg)    SpO2 99%    BMI 32.62 kg/m   BP/Weight 04/22/2021 04/07/2021 2/59/5638   Systolic BP 756 433 295  Diastolic BP 90 80 71  Wt. (Lbs) 196 195 189.6  BMI 32.62 32.45 -    Physical Exam  Diabetic Foot Exam - Simple   No data filed      Lab Results  Component Value Date   WBC 7.0 03/30/2021   HGB 10.1 (L) 03/30/2021   HCT 32.8 (L) 03/30/2021   PLT 317 03/30/2021   GLUCOSE 81 03/30/2021   CHOL 99 03/30/2021   TRIG 57 03/30/2021   HDL 41 03/30/2021   LDLCALC 47 03/30/2021   ALT 25 03/30/2021   AST 28 03/30/2021   NA 139 03/30/2021   K 3.8 03/30/2021   CL 103 03/30/2021   CREATININE 0.99 03/30/2021   BUN 16 03/30/2021  CO2 25 03/30/2021   TSH 18.800 (H) 12/16/2020   INR 1.0 03/29/2021   HGBA1C 5.8 (H) 03/30/2021      Assessment & Plan:   Problem List Items Addressed This Visit       Cardiovascular and Mediastinum   Atherosclerosis of native artery of extremity with intermittent claudication (HCC) - Primary   Relevant Medications   furosemide (LASIX) 40 MG tablet   Other Relevant Orders   AMB Referral to [place] patient's PAD is stable    Hypertensive heart disease with chronic diastolic congestive heart failure (HCC)   Relevant Medications   furosemide (LASIX) 40 MG tablet   Other Relevant Orders   Comprehensive metabolic panel   CBC with Differential/Platelet   AMB Referral to [place] An individual hypertension care plan was established and reinforced today.  The patient's status was assessed using clinical findings on exam and labs or diagnostic tests. The patient's success at meeting treatment goals on disease specific evidence-based guidelines and found to be well controlled. SELF MANAGEMENT: The patient and I together assessed ways to personally work towards obtaining the recommended goals. RECOMMENDATIONS: avoid decongestants found in common cold remedies, decrease consumption of alcohol, perform routine monitoring of BP with home BP cuff, exercise, reduction of dietary salt, take medicines as  prescribed, try not to miss doses and quit smoking.  Regular exercise and maintaining a healthy weight is needed.  Stress reduction may help. A CLINICAL SUMMARY including written plan identify barriers to care unique to individual due to social or financial issues.  We attempt to mutually creat solutions for individual and family understanding.      Central retinal artery occlusion of left eye   Relevant Medications   furosemide (LASIX) 40 MG tablet   Other Relevant Orders   AMB Referral to [place] Patient's vision is stable now     Endocrine   Other specified hypothyroidism   Relevant Orders   TSH Patient is known to have hypothyroid and is n treatment with levothyroxine 169mg.  Patient was diagnosed 10 years ago.  Other treatment includes none.  Patient is compliant with medicines and last TSH 6 months ago.  Last TSH was normal.      Nervous and Auditory   Mixed hearing loss Patient has conductive hearing loss, he is to see hearing aid people.  Needs bone conduction device,     Other   Mixed hyperlipidemia   Relevant Medications   furosemide (LASIX) 40 MG tablet   Other Relevant Orders   Lipid panel   AMB Referral to [facility] hyperlipidemia/ cholesterol was established and reinforced today.  The patient's status was assessed using clinical findings on exam, lab and other diagnostic tests. The patient's disease status was assessed based on evidence-based guidelines and found to be fair controlled. MEDICATIONS were reviewed. SELF MANAGEMENT GOALS have been discussed and patient's success at attaining the goal of low cholesterol was assessed. RECOMMENDATION given include regular exercise 3 days a week and low cholesterol/low fat diet. CLINICAL SUMMARY including written plan to identify barriers unique to the patient due to social or economic  reasons was discussed.    Restless legs syndrome   Relevant Medications    rOPINIRole (REQUIP) 2 MG tablet   Other Relevant Orders   AMB Referral to [facility] RLS worsening, increase ropinirole to '2mg'$  [facility]  .  Meds ordered this encounter  Medications   rOPINIRole (REQUIP) 2 MG tablet  Sig: Take 1 tablet (2 mg total) by mouth at bedtime.    Dispense:  90 tablet    Refill:  2   furosemide (LASIX) 40 MG tablet    Sig: Take 1 tablet (40 mg total) by mouth every other day.    Dispense:  90 tablet    Refill:  2    Orders Placed This Encounter  Procedures   Comprehensive metabolic panel   Lipid panel   CBC with Differential/Platelet   TSH   AMB Referral to [place]   30 minute visit with review of old records  Follow-up: Return in about 4 months (around 08/22/2021) for fasting.  An After Visit Summary was printed and given to the patient.  Reinaldo Meeker, MD [facility] [phone]

## 2021-04-23 LAB — LIPID PANEL
Chol/HDL Ratio: 2.4 ratio (ref 0.0–5.0)
Cholesterol, Total: 101 mg/dL (ref 100–199)
HDL: 42 mg/dL (ref >39)
LDL Chol Calc (NIH): 46 mg/dL (ref 0–99)
Triglycerides: 53 mg/dL (ref 0–149)
VLDL Cholesterol Cal: 13 mg/dL (ref 5–40)

## 2021-04-23 LAB — CBC WITH DIFFERENTIAL/PLATELET
Basophils Absolute: 0.1 10*3/uL (ref 0.0–0.2)
Basos: 1 %
EOS (ABSOLUTE): 0.3 10*3/uL (ref 0.0–0.4)
Eos: 5 %
Hematocrit: 33.9 % — ABNORMAL LOW (ref 37.5–51.0)
Hemoglobin: 10.5 g/dL — ABNORMAL LOW (ref 13.0–17.7)
Immature Grans (Abs): 0 10*3/uL (ref 0.0–0.1)
Immature Granulocytes: 0 %
Lymphocytes Absolute: 0.8 10*3/uL (ref 0.7–3.1)
Lymphs: 12 %
MCH: 26 pg — ABNORMAL LOW (ref 26.6–33.0)
MCHC: 31 g/dL — ABNORMAL LOW (ref 31.5–35.7)
MCV: 84 fL (ref 79–97)
Monocytes Absolute: 0.7 10*3/uL (ref 0.1–0.9)
Monocytes: 11 %
Neutrophils Absolute: 4.7 10*3/uL (ref 1.4–7.0)
Neutrophils: 71 %
Platelets: 342 10*3/uL (ref 150–450)
RBC: 4.04 x10E6/uL — ABNORMAL LOW (ref 4.14–5.80)
RDW: 14.7 % (ref 11.6–15.4)
WBC: 6.6 10*3/uL (ref 3.4–10.8)

## 2021-04-23 LAB — COMPREHENSIVE METABOLIC PANEL
ALT: 22 IU/L (ref 0–44)
AST: 26 IU/L (ref 0–40)
Albumin/Globulin Ratio: 1.7 (ref 1.2–2.2)
Albumin: 4 g/dL (ref 3.7–4.7)
Alkaline Phosphatase: 90 IU/L (ref 44–121)
BUN/Creatinine Ratio: 17 (ref 10–24)
BUN: 17 mg/dL (ref 8–27)
Bilirubin Total: 0.4 mg/dL (ref 0.0–1.2)
CO2: 26 mmol/L (ref 20–29)
Calcium: 9.5 mg/dL (ref 8.6–10.2)
Chloride: 104 mmol/L (ref 96–106)
Creatinine, Ser: 1.01 mg/dL (ref 0.76–1.27)
Globulin, Total: 2.4 g/dL (ref 1.5–4.5)
Glucose: 102 mg/dL — ABNORMAL HIGH (ref 70–99)
Potassium: 4.7 mmol/L (ref 3.5–5.2)
Sodium: 144 mmol/L (ref 134–144)
Total Protein: 6.4 g/dL (ref 6.0–8.5)
eGFR: 78 mL/min/{1.73_m2} (ref >59)

## 2021-04-23 LAB — TSH: TSH: 1.03 u[IU]/mL (ref 0.450–4.500)

## 2021-04-23 LAB — CARDIOVASCULAR RISK ASSESSMENT

## 2021-04-23 NOTE — Progress Notes (Signed)
Glucose 103, kidney tests good, liver tests normal, he still has his chronic anemia, Cholesterol normal, TSH 1.030 now normal, continue dose thyroid ?lp

## 2021-04-28 ENCOUNTER — Other Ambulatory Visit: Payer: Self-pay | Admitting: Legal Medicine

## 2021-04-28 DIAGNOSIS — E782 Mixed hyperlipidemia: Secondary | ICD-10-CM

## 2021-04-28 NOTE — Telephone Encounter (Signed)
Refill sent to pharmacy.   

## 2021-05-14 DIAGNOSIS — I700 Atherosclerosis of aorta: Secondary | ICD-10-CM | POA: Diagnosis not present

## 2021-05-14 DIAGNOSIS — I2510 Atherosclerotic heart disease of native coronary artery without angina pectoris: Secondary | ICD-10-CM | POA: Diagnosis not present

## 2021-05-14 DIAGNOSIS — J84112 Idiopathic pulmonary fibrosis: Secondary | ICD-10-CM | POA: Diagnosis not present

## 2021-05-14 DIAGNOSIS — J90 Pleural effusion, not elsewhere classified: Secondary | ICD-10-CM | POA: Diagnosis not present

## 2021-05-14 DIAGNOSIS — K449 Diaphragmatic hernia without obstruction or gangrene: Secondary | ICD-10-CM | POA: Diagnosis not present

## 2021-05-14 DIAGNOSIS — J8410 Pulmonary fibrosis, unspecified: Secondary | ICD-10-CM | POA: Diagnosis not present

## 2021-05-18 DIAGNOSIS — R0600 Dyspnea, unspecified: Secondary | ICD-10-CM | POA: Diagnosis not present

## 2021-05-18 DIAGNOSIS — J4540 Moderate persistent asthma, uncomplicated: Secondary | ICD-10-CM | POA: Diagnosis not present

## 2021-05-18 DIAGNOSIS — Z8673 Personal history of transient ischemic attack (TIA), and cerebral infarction without residual deficits: Secondary | ICD-10-CM | POA: Diagnosis not present

## 2021-05-18 DIAGNOSIS — Z974 Presence of external hearing-aid: Secondary | ICD-10-CM | POA: Diagnosis not present

## 2021-05-18 DIAGNOSIS — J84112 Idiopathic pulmonary fibrosis: Secondary | ICD-10-CM | POA: Diagnosis not present

## 2021-05-18 DIAGNOSIS — H6093 Unspecified otitis externa, bilateral: Secondary | ICD-10-CM | POA: Diagnosis not present

## 2021-05-18 DIAGNOSIS — H61303 Acquired stenosis of external ear canal, unspecified, bilateral: Secondary | ICD-10-CM | POA: Diagnosis not present

## 2021-05-18 DIAGNOSIS — H9193 Unspecified hearing loss, bilateral: Secondary | ICD-10-CM | POA: Diagnosis not present

## 2021-05-18 DIAGNOSIS — Z85810 Personal history of malignant neoplasm of tongue: Secondary | ICD-10-CM | POA: Diagnosis not present

## 2021-05-18 DIAGNOSIS — H6123 Impacted cerumen, bilateral: Secondary | ICD-10-CM | POA: Diagnosis not present

## 2021-05-20 ENCOUNTER — Telehealth: Payer: Self-pay | Admitting: *Deleted

## 2021-05-20 NOTE — Chronic Care Management (AMB) (Signed)
?  Care Management  ? ?Outreach Note ? ?05/20/2021 ?Name: James Richards MRN: [id number] DOB: 1945-02-25 ? ?Referred by: Lillard Anes, MD ?Reason for referral : Care Coordination (Initial outreach to schedule with [facility] ) ? ? ?An unsuccessful telephone outreach was attempted today. The patient was referred to the case management team for assistance with care management and care coordination.  ? ?Follow Up Plan:  ?A HIPAA compliant phone message was left for the patient providing contact information and requesting a return call.  ?The care management team will reach out to the patient again over the next 14 days.  ?If patient returns call to provider office, please advise to call [place]* at [phone].* ? ?Laverda Sorenson  ?Care Guide, Embedded Care Coordination ?[place]  Care Management  ?Direct Dial: [phone] ? ?

## 2021-05-25 DIAGNOSIS — H6121 Impacted cerumen, right ear: Secondary | ICD-10-CM | POA: Diagnosis not present

## 2021-05-25 DIAGNOSIS — H61303 Acquired stenosis of external ear canal, unspecified, bilateral: Secondary | ICD-10-CM | POA: Diagnosis not present

## 2021-05-31 NOTE — Chronic Care Management (AMB) (Signed)
?  Care Management  ? ?Outreach Note ? ?05/31/2021 ?Name: James Richards MRN: [id number] DOB: 05/25/1945 ? ?Referred by: Lillard Anes, MD ?Reason for referral : Care Coordination (Initial outreach to schedule with [facility] ) ? ? ?A second unsuccessful telephone outreach was attempted today. The patient was referred to the case management team for assistance with care management and care coordination.  ? ?Follow Up Plan:  ?A HIPAA compliant phone message was left for the patient providing contact information and requesting a return call.  ?The care management team will reach out to the patient again over the next 7 days.  ?If patient returns call to provider office, please advise to call [place]* at [phone].* ? ?Laverda Sorenson  ?Care Guide, Embedded Care Coordination ?[place]  Care Management  ?Direct Dial: [phone] ? ?

## 2021-06-01 NOTE — Chronic Care Management (AMB) (Signed)
?  Care Management  ? ?Note ? ?06/01/2021 ?Name: James Richards MRN: [id number] DOB: 02-12-1946 ? ?James Richards is a 76 y.o. year old male who is a primary care patient of Lillard Anes, MD. I reached out to Tresa Endo by phone today offer care coordination services.  ? ?James Richards was given information about care management services today including:  ?Care management services include personalized support from designated clinical staff supervised by his physician, including individualized plan of care and coordination with other care providers ?24/7 contact phone numbers for assistance for urgent and routine care needs. ?The patient may stop care management services at any time by phone call to the office staff. ? ?[name] verbally agreed to assistance and services provided by embedded care coordination/care management team today. ? ?Follow up plan: ?Telephone appointment with care management team member scheduled for:06/22/21 ? ?Laverda Sorenson  ?Care Guide, Embedded Care Coordination ?[place]  Care Management  ?Direct Dial: [phone] ? ?

## 2021-06-03 DIAGNOSIS — R0600 Dyspnea, unspecified: Secondary | ICD-10-CM | POA: Diagnosis not present

## 2021-06-14 ENCOUNTER — Ambulatory Visit (INDEPENDENT_AMBULATORY_CARE_PROVIDER_SITE_OTHER): Payer: Medicare PPO | Admitting: Nurse Practitioner

## 2021-06-14 ENCOUNTER — Encounter: Payer: Self-pay | Admitting: Nurse Practitioner

## 2021-06-14 VITALS — BP 126/70 | HR 66 | Temp 97.5°F | Ht 65.0 in | Wt 192.0 lb

## 2021-06-14 DIAGNOSIS — H60391 Other infective otitis externa, right ear: Secondary | ICD-10-CM | POA: Diagnosis not present

## 2021-06-14 MED ORDER — AZITHROMYCIN 250 MG PO TABS: ORAL_TABLET | ORAL | 0 refills | Status: AC

## 2021-06-14 NOTE — Patient Instructions (Addendum)
Zpack as directed ?Take Tylenol as needed for pain ?Follow-up as needed ? ?Otitis Externa ? ?Otitis externa is an infection of the outer ear canal. The outer ear canal is the area between the outside of the ear and the eardrum. Otitis externa is sometimes called swimmer's ear. ?What are the causes? ?Common causes of this condition include: ?Swimming in dirty water. ?Moisture in the ear. ?An injury to the inside of the ear. ?An object stuck in the ear. ?A cut or scrape on the outside of the ear or in the ear canal. ?What increases the risk? ?You are more likely to get this condition if you go swimming often. ?What are the signs or symptoms? ?Itching in the ear. This is often the first symptom. ?Swelling of the ear. ?Redness in the ear. ?Ear pain. The pain may get worse when you pull on your ear. ?Pus coming from the ear. ?How is this treated? ?This condition may be treated with: ?Antibiotic ear drops. These are often given for 10-14 days. ?Medicines to reduce itching and swelling. ?Follow these instructions at home: ?If you were prescribed antibiotic ear drops, use them as told by your doctor. Do not stop using them even if you start to feel better. ?Take over-the-counter and prescription medicines only as told by your doctor. ?Avoid getting water in your ears as told by your doctor. You may be told to avoid swimming or water sports for a few days. ?Keep all follow-up visits. ?How is this prevented? ?Keep your ears dry. Use the corner of a towel to dry your ears after you swim or bathe. ?Try not to scratch or put things in your ear. Doing these things makes it easier for germs to grow in your ear. ?Avoid swimming in lakes, dirty water, or swimming pools that may not have the right amount of a chemical called chlorine. ?Contact a doctor if: ?You have a fever. ?Your ear is still red, swollen, or painful after 3 days. ?You still have pus coming from your ear after 3 days. ?Your redness, swelling, or pain gets  worse. ?You have a very bad headache. ?Get help right away if: ?You have redness, swelling, and pain or tenderness behind your ear. ?Summary ?Otitis externa is an infection of the outer ear canal. ?Symptoms include pain, redness, and swelling of the ear. ?If you were prescribed antibiotic ear drops, use them as told by your doctor. Do not stop using them even if you start to feel better. ?Try not to scratch or put things in your ear. ?This information is not intended to replace advice given to you by your health care provider. Make sure you discuss any questions you have with your health care provider. ?Document Revised: 04/15/2020 Document Reviewed: 04/15/2020 ?Elsevier Patient Education ? [employer]. ? ?

## 2021-06-14 NOTE — Progress Notes (Signed)
? ?Acute Office Visit ? ?Subjective:  ? ? Patient ID: James Richards, male    DOB: [birth year], 76 y.o.   MRN: [id number] ? ?Chief Complaint  ?Patient presents with  ? Ear Pain  ? ? ?HPI: ?Patient is in today for Ear Pain: Patient presents with right ear pain.  Symptoms include right ear pain. Symptoms began 3 days ago and are unchanged since that time. Patient was seen by ENT to have ear wax removed and was rx'd ear drops for ear infx. Patient states that the ear infection has come back. ? ?Past Medical History:  ?Diagnosis Date  ? Atherosclerosis of native arteries of the extremities with intermittent claudication 10/09/2013  ? Atherosclerotic peripheral vascular disease with intermittent claudication ([place]) 10/03/2012  ? BMI 33.0-33.9,adult 07/07/2019  ? Carotid stenosis 11/24/2014  ? Diastolic dysfunction 59/93/5701  ? Essential hypertension   ? 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  ? History of recurrent TIAs 12/11/2014  ? Impacted cerumen 11/26/2019  ? Malignant neoplasm of base of tongue ([place]) 01/21/2015  ? Malignant neoplasm of larynx, unspecified ([place])   ? Migraine 04/02/2014  ? Mixed hyperlipidemia   ? Other fatigue   ? Other specified hypothyroidism   ? 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   ? Restless legs syndrome   ? Restrictive lung disease 11/26/2019  ? TIA (transient ischemic attack) 11/24/2014  ? Vision exam with abnormal findings concern for left retinal artery occlusion 03/29/2021  ? ? ?Past Surgical History:  ?Procedure Laterality Date  ? LYMPHADENECTOMY    ? PERIPHERAL VASCULAR CATHETERIZATION N/A 11/24/2014  ? Procedure: Aortic Arch Angiography;  Surgeon: Angelia Mould, MD;  Location: [facility];  Service: Cardiovascular;  Laterality: N/A;  ? PERIPHERAL VASCULAR CATHETERIZATION N/A 11/24/2014  ? Procedure: Cerebral Angiography;  Surgeon: Angelia Mould, MD;  Location: [facility];  Service: Cardiovascular;  Laterality: N/A;  ? THROAT  SURGERY    ? TONSILECTOMY/ADENOIDECTOMY WITH MYRINGOTOMY    ? ? ?Family History  ?Problem Relation Age of Onset  ? Other Father   ?     amputation  ? Deep vein thrombosis Father   ? Heart disease Father   ?     Atrial Fib.  ? Cancer Sister   ? Hypertension Brother   ? ? ?Social History  ? ?Socioeconomic History  ? Marital status: Divorced  ?  Spouse name: Not on file  ? Number of children: Not on file  ? Years of education: Not on file  ? Highest education level: Not on file  ?Occupational History  ? Not on file  ?Tobacco Use  ? Smoking status: Former  ?  Packs/day: 1.00  ?  Years: 41.00  ?  Pack years: 41.00  ?  Types: Cigarettes  ?  Quit date: 02/15/2007  ?  Years since quitting: 14.3  ? Smokeless tobacco: Former  ?  Types: Chew  ?Vaping Use  ? Vaping Use: Never used  ?Substance and Sexual Activity  ? Alcohol use: No  ?  Alcohol/week: 0.0 standard drinks  ? Drug use: No  ? Sexual activity: Not Currently  ?  Partners: Female  ?Other Topics Concern  ? Not on file  ?Social History Narrative  ? Not on file  ? ?Social Determinants of Health  ? ?Financial Resource Strain: Not on file  ?Food Insecurity: Not on file  ?Transportation Needs: Not on file  ?Physical Activity: Not on file  ?Stress: Not on file  ?Social  Connections: Not on file  ?Intimate Partner Violence: Not on file  ? ? ?Outpatient Medications Prior to Visit  ?Medication Sig Dispense Refill  ? acetaminophen-codeine (TYLENOL #3) 300-30 MG tablet Take 1 tablet by mouth every 4 (four) hours as needed for moderate pain. 90 tablet 0  ? aspirin EC 81 MG tablet Take 81 mg by mouth daily.    ? atorvastatin (LIPITOR) 80 MG tablet TAKE 1 TABLET EVERY DAY 90 tablet 2  ? clopidogrel (PLAVIX) 75 MG tablet TAKE 1 TABLET EVERY DAY 90 tablet 0  ? furosemide (LASIX) 40 MG tablet Take 1 tablet (40 mg total) by mouth every other day. 90 tablet 2  ? levothyroxine (SYNTHROID) 100 MCG tablet Take 100 mcg by mouth daily.    ? losartan (COZAAR) 50 MG tablet TAKE 1 TABLET EVERY DAY  (Patient taking differently: Take 50 mg by mouth daily.) 90 tablet 3  ? minocycline (MINOCIN) 50 MG capsule TAKE 1 CAPSULE EVERY 12 HOURS (Patient taking differently: Take 50 mg by mouth daily.) 180 capsule 2  ? Omega-3 Fatty Acids (FISH OIL) 1200 MG CPDR Take 1 capsule by mouth daily.    ? omeprazole (PRILOSEC) 40 MG capsule TAKE 1 CAPSULE EVERY DAY (Patient taking differently: Take 40 mg by mouth daily.) 90 capsule 2  ? rOPINIRole (REQUIP) 2 MG tablet Take 1 tablet (2 mg total) by mouth at bedtime. 90 tablet 2  ? senna-docusate (SENOKOT-S) 8.6-50 MG tablet Take 1 tablet by mouth at bedtime as needed for mild constipation. 30 tablet 0  ? TRELEGY ELLIPTA 100-62.5-25 MCG/ACT AEPB Take 1 puff by mouth daily.    ? ?No facility-administered medications prior to visit.  ? ? ?No Known Allergies ? ?Review of Systems  ?Constitutional:  Negative for chills and fever.  ?HENT:  Positive for ear pain (Right).   ?Gastrointestinal:  Negative for nausea and vomiting.  ?Neurological:  Positive for headaches.  ? ?   ?Objective:  ?  ?Physical Exam ?Vitals reviewed.  ?HENT:  ?   Right Ear: Decreased hearing noted. Swelling and tenderness present.  ?   Left Ear: Tympanic membrane normal.  ?Neurological:  ?   Mental Status: He is alert.  ? ? ?BP 126/70   Pulse 66   Temp (!) 97.5 ?F (36.4 ?C)   Ht '5\' 5"'  (1.651 m)   Wt 192 lb (87.1 kg)   SpO2 99%   BMI 31.95 kg/m?   ?Wt Readings from Last 3 Encounters:  ?06/14/21 192 lb (87.1 kg)  ?04/22/21 196 lb (88.9 kg)  ?04/07/21 195 lb (88.5 kg)  ? ? ?Health Maintenance Due  ?Topic Date Due  ? Hepatitis C Screening  Never done  ? TETANUS/TDAP  Never done  ? Zoster Vaccines- Shingrix (1 of 2) Never done  ? COVID-19 Vaccine (3 - Moderna risk series) 07/14/2020  ? COLONOSCOPY (Pts 45-47yr Insurance coverage will need to be confirmed)  04/26/2021  ? ? ? ? ? ?Lab Results  ?Component Value Date  ? TSH 1.030 04/22/2021  ? ?Lab Results  ?Component Value Date  ? WBC 6.6 04/22/2021  ? HGB 10.5 (L)  04/22/2021  ? HCT 33.9 (L) 04/22/2021  ? MCV 84 04/22/2021  ? PLT 342 04/22/2021  ? ?Lab Results  ?Component Value Date  ? NA 144 04/22/2021  ? K 4.7 04/22/2021  ? CO2 26 04/22/2021  ? GLUCOSE 102 (H) 04/22/2021  ? BUN 17 04/22/2021  ? CREATININE 1.01 04/22/2021  ? BILITOT 0.4 04/22/2021  ? ALKPHOS  90 04/22/2021  ? AST 26 04/22/2021  ? ALT 22 04/22/2021  ? PROT 6.4 04/22/2021  ? ALBUMIN 4.0 04/22/2021  ? CALCIUM 9.5 04/22/2021  ? ANIONGAP 11 03/30/2021  ? EGFR 78 04/22/2021  ? ?Lab Results  ?Component Value Date  ? CHOL 101 04/22/2021  ? ?Lab Results  ?Component Value Date  ? HDL 42 04/22/2021  ? ?Lab Results  ?Component Value Date  ? LDLCALC 46 04/22/2021  ? ?Lab Results  ?Component Value Date  ? TRIG 53 04/22/2021  ? ?Lab Results  ?Component Value Date  ? CHOLHDL 2.4 04/22/2021  ? ?Lab Results  ?Component Value Date  ? HGBA1C 5.8 (H) 03/30/2021  ? ? ?   ?Assessment & Plan:  ? ? ?1. Other infective acute otitis externa of right ear ?- azithromycin (ZITHROMAX) 250 MG tablet; Take 2 tablets on day 1, then 1 tablet daily on days 2 through 5  Dispense: 6 tablet; Refill: 0 ?  ?Zpack as directed ?Take Tylenol as needed for pain ?Follow-up as needed ?  ? ?Follow-up: PRN ? ?An After Visit Summary was printed and given to the patient. ? ?I, Rip Harbour, NP, have reviewed all documentation for this visit. The documentation on 06/14/21 for the exam, diagnosis, procedures, and orders are all accurate and complete.  ? ? ?Signed, ?Rip Harbour, NP ?[place] ?[phone] ?

## 2021-06-22 ENCOUNTER — Encounter: Payer: Self-pay | Admitting: *Deleted

## 2021-06-22 ENCOUNTER — Ambulatory Visit: Payer: Medicare PPO | Admitting: *Deleted

## 2021-06-22 DIAGNOSIS — J984 Other disorders of lung: Secondary | ICD-10-CM

## 2021-06-22 DIAGNOSIS — H3412 Central retinal artery occlusion, left eye: Secondary | ICD-10-CM

## 2021-06-22 DIAGNOSIS — I110 Hypertensive heart disease with heart failure: Secondary | ICD-10-CM

## 2021-06-22 NOTE — Chronic Care Management (AMB) (Signed)
? Care Management ?  ? RN Visit Note ? ?06/22/2021 ?Name: James Richards MRN: [id number] DOB: Aug 10, 1945 ? ?Subjective: ?James Richards is a 76 y.o. year old male who is a primary care patient of Lillard Anes, MD. The care management team was consulted for assistance with disease management and care coordination needs.   ? ?Engaged with patient and POA, James Richards, by telephone  for initial visit in response to provider referral for case management and/or care coordination services.  ? ?Consent to Services:  ? James Richards was given information about Care Management services today including:  ?Care Management services includes personalized support from designated clinical staff supervised by his physician, including individualized plan of care and coordination with other care providers ?24/7 contact phone numbers for assistance for urgent and routine care needs. ?The patient may stop case management services at any time by phone call to the office staff. ? ?Patient and caregiver were appreciative of the telephone call and are glad that Case Management service may be available in the future, but they do not feel that they are needed at this time.  ? ?Assessment: Review of patient past medical history, allergies, medications, health status, including review of consultants reports, laboratory and other test data, was performed as part of comprehensive evaluation and provision of chronic care management services.  ? ?SDOH (Social Determinants of Health) assessments and interventions performed:  ?SDOH Interventions   ? ?Flowsheet Row Most Recent Value  ?SDOH Interventions   ?Food Insecurity Interventions Intervention Not Indicated  ?Housing Interventions Intervention Not Indicated  ?Social Connections Interventions Intervention Not Indicated  ?Transportation Interventions Intervention Not Indicated  ? ?  ?  ? ?Care Plan ? ?No Known Allergies ? ?Outpatient Encounter Medications as of 06/22/2021  ?Medication Sig  ?  acetaminophen-codeine (TYLENOL #3) 300-30 MG tablet Take 1 tablet by mouth every 4 (four) hours as needed for moderate pain.  ? aspirin EC 81 MG tablet Take 81 mg by mouth daily.  ? atorvastatin (LIPITOR) 80 MG tablet TAKE 1 TABLET EVERY DAY  ? clopidogrel (PLAVIX) 75 MG tablet TAKE 1 TABLET EVERY DAY  ? furosemide (LASIX) 40 MG tablet Take 1 tablet (40 mg total) by mouth every other day.  ? levothyroxine (SYNTHROID) 100 MCG tablet Take 100 mcg by mouth daily.  ? losartan (COZAAR) 50 MG tablet TAKE 1 TABLET EVERY DAY (Patient taking differently: Take 50 mg by mouth daily.)  ? minocycline (MINOCIN) 50 MG capsule TAKE 1 CAPSULE EVERY 12 HOURS (Patient taking differently: Take 50 mg by mouth daily.)  ? Omega-3 Fatty Acids (FISH OIL) 1200 MG CPDR Take 1 capsule by mouth daily.  ? omeprazole (PRILOSEC) 40 MG capsule TAKE 1 CAPSULE EVERY DAY (Patient taking differently: Take 40 mg by mouth daily.)  ? rOPINIRole (REQUIP) 2 MG tablet Take 1 tablet (2 mg total) by mouth at bedtime.  ? senna-docusate (SENOKOT-S) 8.6-50 MG tablet Take 1 tablet by mouth at bedtime as needed for mild constipation.  ? TRELEGY ELLIPTA 100-62.5-25 MCG/ACT AEPB Take 1 puff by mouth daily.  ? ?No facility-administered encounter medications on file as of 06/22/2021.  ? ? ?Patient Active Problem List  ? Diagnosis Date Noted  ? Central retinal artery occlusion of left eye 03/31/2021  ? Occlusion of extracranial carotid artery, left 03/31/2021  ? Hypertensive heart disease with chronic diastolic congestive heart failure ([place]) 06/23/2020  ? Mixed hearing loss 06/23/2020  ? Diastolic dysfunction 27/74/1287  ? Restrictive lung disease 11/26/2019  ? BMI 33.0-33.9,adult  07/07/2019  ? Other specified hypothyroidism   ? Mixed hyperlipidemia   ? Restless legs syndrome   ? Carotid stenosis 11/24/2014  ? 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  ? Migraine 04/02/2014  ? Atherosclerosis of native artery of extremity with intermittent claudication ([place])  10/09/2013  ? ?I talked with James Richards and his caregiver, James Richards, regarding current health state and potential care coordination and resource needs. No needs identified. Patient is being followed by pulmonologist regarding recent diagnosis of asthma and is being treated with Trelegy. This is working well. Discussed cost of trelegy and it is affordable now at $47. Advised that he may enter the Medicare Coverage Gap and if he does have trouble paying for it then, to call PCP or pulmonologist for samples or assistance with prescription assistance application. He had a recent spirometry and is scheduled for a sleep study. Pulmonologist to review those results with him afterwards. Discussed recurrent TIAs and stroke that affected vision in left eye. He is being followed by neurology. Although he still has some central vision loss, he is compensating well with his right eye. He has some SOB with activity and I question if that is pulmonary or CHF related. SOB improves with rest. He is followed by cardiology. Encouraged to reach ou to them with any new or worsening symptoms. He takes furosemide every 2 or 3 days as needed for lower extremity edema. He does urinate more after taking it and edema improves. Discussed mobility and ability to perform ADLs. No difficulty with walking or balance. He stays active and enjoys fishing, working on his computer, tablet, and reading magazines. Patient and caregiver stated understanding of importance of staying active and how it improves health and longevity.  ? ?Patient Care Activities: ?Patient will weigh and record weight each morning after urinating ?Patient will call cardiologist with any weight gain of more than 3 lbs overnight or more than 5 lbs in one week ?Patient will take medications as prescribed ?Patient will remain physically and socially active ?Patient will keep all medical appointments ?Patient will call PCP as needed ?Patient will call pulmonologist or PCP if trelegy  becomes unaffordable ?Patient will reach out to PCP if he requires Care Management services ? ?Plan: The patient has been provided with contact information for the care management team and has been advised to call with any health related questions or concerns.  ? ?Chong Sicilian, BSN, RN-BC ?Embedded Chronic Care Manager ?[facility] ?Direct Dial: [phone] ? ? ? ? ? ? ? ? ? ? ?

## 2021-06-22 NOTE — Patient Instructions (Addendum)
Visit Information  ? ?Thank you for taking time to visit with me today. Please don't hesitate to contact me if I can be of assistance to you before our next scheduled telephone appointment. ? ?Following are the goals we discussed today:  ?Patient Care Activities: ?Patient will weigh and record weight each morning after urinating ?Patient will call cardiologist with any weight gain of more than 3 lbs overnight or more than 5 lbs in one week ?Patient will take medications as prescribed ?Patient will remain physically and socially active ?Patient will keep all medical appointments ?Patient will call PCP as needed ?Patient will call pulmonologist or PCP if trelegy becomes unaffordable ?Patient will reach out to PCP if he requires Care Management services ?  ? ?Please call the care guide team at [phone] if you need to cancel or reschedule your appointment.  ? ?If you are experiencing a Mental Health or [place] or need someone to talk to, please call the [place] National Suicide Prevention Lifeline: 947-619-6104 or TTY: (978)615-0787 TTY (531)655-2443) to talk to a trained counselor ?call 911  ? ? ?Consent to CCM Services: ?Mr. Cammarata was given information about Chronic Care Management services including:  ?CCM service includes personalized support from designated clinical staff supervised by his physician, including individualized plan of care and coordination with other care providers ?24/7 contact phone numbers for assistance for urgent and routine care needs. ?Service will only be billed when office clinical staff spend 20 minutes or more in a month to coordinate care. ?Only one practitioner may furnish and bill the service in a calendar month. ?The patient may stop CCM services at any time (effective at the end of the month) by phone call to the office staff. ?The patient will be responsible for cost sharing (co-pay) of up to 20% of the service fee (after annual deductible is met). ? ?Patient feels  well managed and declined services at this time ? ?Patient verbalizes understanding of instructions and care plan provided today and agrees to view in [place]. Active MyChart status confirmed with patient.   ? ?Plan: The patient has been provided with contact information for the care management team and has been advised to call with any health related questions or concerns.  ?  ?Chong Sicilian, BSN, RN-BC ?Embedded Chronic Care Manager ?[facility] Management ?Direct Dial: [phone] ?  ? ?  ?

## 2021-06-25 ENCOUNTER — Ambulatory Visit (INDEPENDENT_AMBULATORY_CARE_PROVIDER_SITE_OTHER): Payer: Medicare PPO | Admitting: Nurse Practitioner

## 2021-06-25 VITALS — BP 134/78 | HR 97 | Temp 97.1°F | Ht 65.0 in | Wt 206.0 lb

## 2021-06-25 DIAGNOSIS — H6504 Acute serous otitis media, recurrent, right ear: Secondary | ICD-10-CM

## 2021-06-25 MED ORDER — AMOXICILLIN-POT CLAVULANATE 875-125 MG PO TABS: 1.0000 | ORAL_TABLET | Freq: Two times a day (BID) | ORAL | 0 refills | Status: DC

## 2021-06-25 MED ORDER — NEOMYCIN-POLYMYXIN-HC 3.5-10000-1 OP SUSP: OPHTHALMIC | 0 refills | Status: DC

## 2021-06-25 NOTE — Progress Notes (Signed)
? ?Acute Office Visit ? ?Subjective:  ? ? Patient ID: James Richards, male    DOB: [birth year], 76 y.o.   MRN: [id number] ? ?CC: ?Right ear pain ? ?HPI: ?Patient is in today for Ear Pain: Patient presents with right ear pain, onset  4 days ago. Denies fever, URI symptoms, or drainage from ear. He was treated with a course of topical antibiotic ear drops per ENT specialist and oral Azithromycin on 06/14/21. He was seen by audiologist for new hearing aids who stated his ear was still infected. Ear history: several previous ear infections.  ? ? ?Past Medical History:  ?Diagnosis Date  ? Atherosclerosis of native arteries of the extremities with intermittent claudication 10/09/2013  ? Atherosclerotic peripheral vascular disease with intermittent claudication ([place]) 10/03/2012  ? BMI 33.0-33.9,adult 07/07/2019  ? Carotid stenosis 11/24/2014  ? Diastolic dysfunction 59/56/3875  ? Essential hypertension   ? 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  ? History of recurrent TIAs 12/11/2014  ? Impacted cerumen 11/26/2019  ? Malignant neoplasm of base of tongue ([place]) 01/21/2015  ? Malignant neoplasm of larynx, unspecified ([place])   ? Migraine 04/02/2014  ? Mixed hyperlipidemia   ? Other fatigue   ? Other specified hypothyroidism   ? 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   ? Restless legs syndrome   ? Restrictive lung disease 11/26/2019  ? TIA (transient ischemic attack) 11/24/2014  ? Vision exam with abnormal findings concern for left retinal artery occlusion 03/29/2021  ? ? ?Past Surgical History:  ?Procedure Laterality Date  ? LYMPHADENECTOMY    ? PERIPHERAL VASCULAR CATHETERIZATION N/A 11/24/2014  ? Procedure: Aortic Arch Angiography;  Surgeon: Angelia Mould, MD;  Location: [facility];  Service: Cardiovascular;  Laterality: N/A;  ? PERIPHERAL VASCULAR CATHETERIZATION N/A 11/24/2014  ? Procedure: Cerebral Angiography;  Surgeon: Angelia Mould, MD;  Location: [facility];   Service: Cardiovascular;  Laterality: N/A;  ? THROAT SURGERY    ? TONSILECTOMY/ADENOIDECTOMY WITH MYRINGOTOMY    ? ? ?Family History  ?Problem Relation Age of Onset  ? Other Father   ?     amputation  ? Deep vein thrombosis Father   ? Heart disease Father   ?     Atrial Fib.  ? Cancer Sister   ? Hypertension Brother   ? ? ?Social History  ? ?Socioeconomic History  ? Marital status: Divorced  ?  Spouse name: Not on file  ? Number of children: Not on file  ? Years of education: Not on file  ? Highest education level: Not on file  ?Occupational History  ? Not on file  ?Tobacco Use  ? Smoking status: Former  ?  Packs/day: 1.00  ?  Years: 41.00  ?  Pack years: 41.00  ?  Types: Cigarettes  ?  Quit date: 02/15/2007  ?  Years since quitting: 14.3  ? Smokeless tobacco: Former  ?  Types: Chew  ?Vaping Use  ? Vaping Use: Never used  ?Substance and Sexual Activity  ? Alcohol use: No  ?  Alcohol/week: 0.0 standard drinks  ? Drug use: No  ? Sexual activity: Not Currently  ?  Partners: Female  ?Other Topics Concern  ? Not on file  ?Social History Narrative  ? Not on file  ? ?Social Determinants of Health  ? ?Financial Resource Strain: Low Risk   ? Difficulty of Paying Living Expenses: Not hard at all  ?Food Insecurity: No Food Insecurity  ? Worried About  Running Out of Food in the Last Year: Never true  ? Ran Out of Food in the Last Year: Never true  ?Transportation Needs: No Transportation Needs  ? Lack of Transportation (Medical): No  ? Lack of Transportation (Non-Medical): No  ?Physical Activity: Not on file  ?Stress: Not on file  ?Social Connections: Socially Integrated  ? Frequency of Communication with Friends and Family: More than three times a week  ? Frequency of Social Gatherings with Friends and Family: Twice a week  ? Attends Religious Services: More than 4 times per year  ? Active Member of Clubs or Organizations: Yes  ? Attends Archivist Meetings: More than 4 times per year  ? Marital Status: Married   ?Intimate Partner Violence: Not on file  ? ? ?Outpatient Medications Prior to Visit  ?Medication Sig Dispense Refill  ? acetaminophen-codeine (TYLENOL #3) 300-30 MG tablet Take 1 tablet by mouth every 4 (four) hours as needed for moderate pain. 90 tablet 0  ? aspirin EC 81 MG tablet Take 81 mg by mouth daily.    ? atorvastatin (LIPITOR) 80 MG tablet TAKE 1 TABLET EVERY DAY 90 tablet 2  ? clopidogrel (PLAVIX) 75 MG tablet TAKE 1 TABLET EVERY DAY 90 tablet 0  ? furosemide (LASIX) 40 MG tablet Take 1 tablet (40 mg total) by mouth every other day. 90 tablet 2  ? levothyroxine (SYNTHROID) 100 MCG tablet Take 100 mcg by mouth daily.    ? losartan (COZAAR) 50 MG tablet TAKE 1 TABLET EVERY DAY (Patient taking differently: Take 50 mg by mouth daily.) 90 tablet 3  ? minocycline (MINOCIN) 50 MG capsule TAKE 1 CAPSULE EVERY 12 HOURS (Patient taking differently: Take 50 mg by mouth daily.) 180 capsule 2  ? Omega-3 Fatty Acids (FISH OIL) 1200 MG CPDR Take 1 capsule by mouth daily.    ? omeprazole (PRILOSEC) 40 MG capsule TAKE 1 CAPSULE EVERY DAY (Patient taking differently: Take 40 mg by mouth daily.) 90 capsule 2  ? rOPINIRole (REQUIP) 2 MG tablet Take 1 tablet (2 mg total) by mouth at bedtime. 90 tablet 2  ? senna-docusate (SENOKOT-S) 8.6-50 MG tablet Take 1 tablet by mouth at bedtime as needed for mild constipation. 30 tablet 0  ? TRELEGY ELLIPTA 100-62.5-25 MCG/ACT AEPB Take 1 puff by mouth daily.    ? ?No facility-administered medications prior to visit.  ? ? ?No Known Allergies ? ?Review of Systems ?See pertinent positives and negatives per HPI.  ?   ?Objective:  ?  ?Physical Exam ?Vitals reviewed.  ?HENT:  ?   Right Ear: Swelling and tenderness present. Tympanic membrane is erythematous.  ?   Left Ear: Tympanic membrane normal.  ?Neurological:  ?   Mental Status: He is alert.  ? ? ?BP 134/78   Pulse 97   Temp (!) 97.1 ?F (36.2 ?C)   Ht '5\' 5"'  (1.651 m)   Wt 206 lb (93.4 kg)   SpO2 100%   BMI 34.28 kg/m?   ?Wt  Readings from Last 3 Encounters:  ?06/14/21 192 lb (87.1 kg)  ?04/22/21 196 lb (88.9 kg)  ?04/07/21 195 lb (88.5 kg)  ? ? ?Health Maintenance Due  ?Topic Date Due  ? Hepatitis C Screening  Never done  ? TETANUS/TDAP  Never done  ? Zoster Vaccines- Shingrix (1 of 2) Never done  ? COVID-19 Vaccine (3 - Moderna risk series) 07/14/2020  ? COLONOSCOPY (Pts 45-79yr Insurance coverage will need to be confirmed)  04/26/2021  ? ? ? ? ? ?  Lab Results  ?Component Value Date  ? TSH 1.030 04/22/2021  ? ?Lab Results  ?Component Value Date  ? WBC 6.6 04/22/2021  ? HGB 10.5 (L) 04/22/2021  ? HCT 33.9 (L) 04/22/2021  ? MCV 84 04/22/2021  ? PLT 342 04/22/2021  ? ?Lab Results  ?Component Value Date  ? NA 144 04/22/2021  ? K 4.7 04/22/2021  ? CO2 26 04/22/2021  ? GLUCOSE 102 (H) 04/22/2021  ? BUN 17 04/22/2021  ? CREATININE 1.01 04/22/2021  ? BILITOT 0.4 04/22/2021  ? ALKPHOS 90 04/22/2021  ? AST 26 04/22/2021  ? ALT 22 04/22/2021  ? PROT 6.4 04/22/2021  ? ALBUMIN 4.0 04/22/2021  ? CALCIUM 9.5 04/22/2021  ? ANIONGAP 11 03/30/2021  ? EGFR 78 04/22/2021  ? ?Lab Results  ?Component Value Date  ? CHOL 101 04/22/2021  ? ?Lab Results  ?Component Value Date  ? HDL 42 04/22/2021  ? ?Lab Results  ?Component Value Date  ? LDLCALC 46 04/22/2021  ? ?Lab Results  ?Component Value Date  ? TRIG 53 04/22/2021  ? ?Lab Results  ?Component Value Date  ? CHOLHDL 2.4 04/22/2021  ? ?Lab Results  ?Component Value Date  ? HGBA1C 5.8 (H) 03/30/2021  ? ? ?   ?Assessment & Plan:  ? ?1. Recurrent acute serous otitis media of right ear ?- neomycin-polymyxin-hydrocortisone (CORTISPORIN) 3.5-10000-1 ophthalmic suspension; Instill 3 gtts to right ear twice daily for 5 days  Dispense: 7.5 mL; Refill: 0 ?- amoxicillin-clavulanate (AUGMENTIN) 875-125 MG tablet; Take 1 tablet by mouth 2 (two) times daily.  Dispense: 20 tablet; Refill: 0 ?  ? ? ?Take Augmentin twice daily with food for 10 days ?Use antibiotic ear drops to right ear twice daily for 5 days ?Follow-up as  needed ? ?Follow-up: PRN ? ?An After Visit Summary was printed and given to the patient. ? ?I, Rip Harbour, NP, have reviewed all documentation for this visit. The documentation on 06/27/21 for the exam, diagnosis, procedu

## 2021-06-25 NOTE — Patient Instructions (Addendum)
Take Augmentin twice daily with food for 10 days ?Use antibiotic ear drops to right ear twice daily for 5 days ?Follow-up as needed ? ? ? ?Otitis Media, Adult ? ?Otitis media is a condition in which the middle ear is red and swollen (inflamed) and full of fluid. The middle ear is the part of the ear that contains bones for hearing as well as air that helps send sounds to the brain. The condition usually goes away on its own. ?What are the causes? ?This condition is caused by a blockage in the eustachian tube. This tube connects the middle ear to the back of the nose. It normally allows air into the middle ear. The blockage is caused by fluid or swelling. Problems that can cause blockage include: ?A cold or infection that affects the nose, mouth, or throat. ?Allergies. ?An irritant, such as tobacco smoke. ?Adenoids that have become large. The adenoids are soft tissue located in the back of the throat, behind the nose and the roof of the mouth. ?Growth or swelling in the upper part of the throat, just behind the nose (nasopharynx). ?Damage to the ear caused by a change in pressure. This is called barotrauma. ?What increases the risk? ?You are more likely to develop this condition if you: ?Smoke or are exposed to tobacco smoke. ?Have an opening in the roof of your mouth (cleft palate). ?Have acid reflux. ?Have problems in your body's defense system (immune system). ?What are the signs or symptoms? ?Symptoms of this condition include: ?Ear pain. ?Fever. ?Problems with hearing. ?Being tired. ?Fluid leaking from the ear. ?Ringing in the ear. ?How is this treated? ?This condition can go away on its own within 3-5 days. But if the condition is caused by germs (bacteria) and does not go away on its own, or if it keeps coming back, your doctor may: ?Give you antibiotic medicines. ?Give you medicines for pain. ?Follow these instructions at home: ?Take over-the-counter and prescription medicines only as told by your doctor. ?If  you were prescribed an antibiotic medicine, take it as told by your doctor. Do not stop taking it even if you start to feel better. ?Keep all follow-up visits. ?Contact a doctor if: ?You have bleeding from your nose. ?There is a lump on your neck. ?You are not feeling better in 5 days. ?You feel worse instead of better. ?Get help right away if: ?You have pain that is not helped with medicine. ?You have swelling, redness, or pain around your ear. ?You get a stiff neck. ?You cannot move part of your face (paralysis). ?You notice that the bone behind your ear hurts when you touch it. ?You get a very bad headache. ?Summary ?Otitis media means that the middle ear is red, swollen, and full of fluid. ?This condition usually goes away on its own. ?If the problem does not go away, treatment may be needed. You may be given medicines to treat the infection or to treat your pain. ?If you were prescribed an antibiotic medicine, take it as told by your doctor. Do not stop taking it even if you start to feel better. ?Keep all follow-up visits. ?This information is not intended to replace advice given to you by your health care provider. Make sure you discuss any questions you have with your health care provider. ?Document Revised: 05/11/2020 Document Reviewed: 05/11/2020 ?Elsevier Patient Education ? [place]. ? ?

## 2021-06-27 ENCOUNTER — Encounter: Payer: Self-pay | Admitting: Nurse Practitioner

## 2021-06-28 ENCOUNTER — Other Ambulatory Visit: Payer: Self-pay

## 2021-06-28 MED ORDER — OFLOXACIN 0.3 % OT SOLN: 5.0000 [drp] | Freq: Every day | OTIC | 0 refills | Status: DC

## 2021-07-01 NOTE — Progress Notes (Signed)
This patient has Hx of restrictive lung disease as shown in one of her lung function test as "moderate restrictive" and for that reason, she has been on breathing medications chronically.

## 2021-07-02 ENCOUNTER — Other Ambulatory Visit: Payer: Self-pay | Admitting: Legal Medicine

## 2021-07-04 DIAGNOSIS — Z5321 Procedure and treatment not carried out due to patient leaving prior to being seen by health care provider: Secondary | ICD-10-CM | POA: Diagnosis not present

## 2021-07-04 DIAGNOSIS — H5712 Ocular pain, left eye: Secondary | ICD-10-CM | POA: Diagnosis not present

## 2021-07-07 ENCOUNTER — Telehealth: Payer: Self-pay

## 2021-07-07 NOTE — Telephone Encounter (Signed)
Patient is requesting refill of tylenol #3 to [facility] mail order. Please advise. Could not pend order.   James Richards, [place] 07/07/21 11:21 AM

## 2021-07-20 DIAGNOSIS — J84112 Idiopathic pulmonary fibrosis: Secondary | ICD-10-CM | POA: Diagnosis not present

## 2021-07-20 DIAGNOSIS — R918 Other nonspecific abnormal finding of lung field: Secondary | ICD-10-CM | POA: Diagnosis not present

## 2021-07-20 DIAGNOSIS — R0600 Dyspnea, unspecified: Secondary | ICD-10-CM | POA: Diagnosis not present

## 2021-07-20 DIAGNOSIS — J4540 Moderate persistent asthma, uncomplicated: Secondary | ICD-10-CM | POA: Diagnosis not present

## 2021-07-27 ENCOUNTER — Encounter: Payer: Self-pay | Admitting: Legal Medicine

## 2021-07-27 ENCOUNTER — Ambulatory Visit (INDEPENDENT_AMBULATORY_CARE_PROVIDER_SITE_OTHER): Payer: Medicare PPO | Admitting: Legal Medicine

## 2021-07-27 VITALS — BP 120/70 | HR 93 | Temp 98.1°F | Resp 15 | Ht 65.0 in | Wt 189.0 lb

## 2021-07-27 DIAGNOSIS — H66014 Acute suppurative otitis media with spontaneous rupture of ear drum, recurrent, right ear: Secondary | ICD-10-CM

## 2021-07-27 DIAGNOSIS — H7291 Unspecified perforation of tympanic membrane, right ear: Secondary | ICD-10-CM | POA: Diagnosis not present

## 2021-07-27 DIAGNOSIS — H663X1 Other chronic suppurative otitis media, right ear: Secondary | ICD-10-CM | POA: Diagnosis not present

## 2021-07-27 DIAGNOSIS — H6690 Otitis media, unspecified, unspecified ear: Secondary | ICD-10-CM | POA: Insufficient documentation

## 2021-07-27 DIAGNOSIS — H6691 Otitis media, unspecified, right ear: Secondary | ICD-10-CM

## 2021-07-27 DIAGNOSIS — H66019 Acute suppurative otitis media with spontaneous rupture of ear drum, unspecified ear: Secondary | ICD-10-CM | POA: Insufficient documentation

## 2021-07-27 MED ORDER — CIPROFLOXACIN HCL 0.2 % OT SOLN: 0.2000 mL | Freq: Two times a day (BID) | OTIC | 0 refills | Status: DC

## 2021-07-27 MED ORDER — AMOXICILLIN-POT CLAVULANATE 875-125 MG PO TABS: 1.0000 | ORAL_TABLET | Freq: Two times a day (BID) | ORAL | 0 refills | Status: DC

## 2021-07-27 NOTE — Progress Notes (Signed)
\    Acute Office Visit  Subjective:    Patient ID: James Richards, male    DOB: 03-Oct-1945, 76 y.o.   MRN: [id number]  Chief Complaint  Patient presents with   Check for ear infection    HPI: Patient is in today for right ear infection since 2 months ago. He has been taking augmentin, otic drops and he is still having some pain and discharge. He went to ENT and he gave him some ear drops. They told him the infection cleared up.  When he went to get his hearing aids, he still had an infection and he came here and they gave him ears drops again. Now he wants to be check if the infection is gone.    THE Right ear shows purulence in ear canal that was cultured. I was unable to suction it clear to see TM.  Past Medical History:  Diagnosis Date   Atherosclerosis of native arteries of the extremities with intermittent claudication 10/09/2013   Atherosclerotic peripheral vascular disease with intermittent claudication ([place]) 10/03/2012   BMI 33.0-33.9,adult 07/07/2019   Carotid stenosis 79/15/0569   Diastolic dysfunction 79/48/0165   Essential hypertension    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   History of recurrent TIAs 12/11/2014   Impacted cerumen 11/26/2019   Malignant neoplasm of base of tongue ([place]) 01/21/2015   Malignant neoplasm of larynx, unspecified ([facility])    Migraine 04/02/2014   Mixed hyperlipidemia    Other fatigue    Other specified hypothyroidism    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    Restless legs syndrome    Restrictive lung disease 11/26/2019   TIA (transient ischemic attack) 11/24/2014   Vision exam with abnormal findings concern for left retinal artery occlusion 03/29/2021    Past Surgical History:  Procedure Laterality Date   LYMPHADENECTOMY     PERIPHERAL VASCULAR CATHETERIZATION N/A 11/24/2014   Procedure: Aortic Arch Angiography;  Surgeon: Angelia Mould, MD;  Location: [facility];  Service: Cardiovascular;   Laterality: N/A;   PERIPHERAL VASCULAR CATHETERIZATION N/A 11/24/2014   Procedure: Cerebral Angiography;  Surgeon: Angelia Mould, MD;  Location: [facility];  Service: Cardiovascular;  Laterality: N/A;   THROAT SURGERY     TONSILECTOMY/ADENOIDECTOMY WITH MYRINGOTOMY      Family History  Problem Relation Age of Onset   Other Father        amputation   Deep vein thrombosis Father    Heart disease Father        Atrial Fib.   Cancer Sister    Hypertension Brother     Social History   Socioeconomic History   Marital status: Divorced    Spouse name: Not on file   Number of children: Not on file   Years of education: Not on file   Highest education level: Not on file  Occupational History   Not on file  Tobacco Use   Smoking status: Former    Packs/day: 1.00    Years: 41.00    Total pack years: 41.00    Types: Cigarettes    Quit date: 02/15/2007    Years since quitting: 14.4   Smokeless tobacco: Former    Types: Nurse, children's Use: Never used  Substance and Sexual Activity   Alcohol use: No    Alcohol/week: 0.0 standard drinks of alcohol   Drug use: No   Sexual activity: Not Currently    Partners: Female  Other Topics Concern   Not on file  Social History Narrative   Not on file   Social Determinants of Health   Financial Resource Strain: Low Risk  (06/22/2021)   Overall Financial Resource Strain (CARDIA)    Difficulty of Paying Living Expenses: Not hard at all  Food Insecurity: No Food Insecurity (06/22/2021)   Hunger Vital Sign    Worried About Running Out of Food in the Last Year: Never true    Ran Out of Food in the Last Year: Never true  Transportation Needs: No Transportation Needs (06/22/2021)   PRAPARE - Hydrologist (Medical): No    Lack of Transportation (Non-Medical): No  Physical Activity: Inactive (01/28/2020)   Exercise Vital Sign    Days of Exercise per Week: 0 days    Minutes of Exercise per  Session: 0 min  Stress: No Stress Concern Present (01/28/2020)   [place]    Feeling of Stress : Not at all  Social Connections: [place] (06/22/2021)   Social Connection and Isolation Panel [NHANES]    Frequency of Communication with Friends and Family: More than three times a week    Frequency of Social Gatherings with Friends and Family: Twice a week    Attends Religious Services: More than 4 times per year    Active Member of [employer] or Organizations: Yes    Attends Music therapist: More than 4 times per year    Marital Status: Married  Human resources officer Violence: Not At Risk (01/28/2020)   Humiliation, Afraid, Rape, and Kick questionnaire    Fear of Current or Ex-Partner: No    Emotionally Abused: No    Physically Abused: No    Sexually Abused: No    Outpatient Medications Prior to Visit  Medication Sig Dispense Refill   acetaminophen-codeine (TYLENOL #3) 300-30 MG tablet TAKE 1 TABLET EVERY 4 HOURS AS NEEDED FOR MODERATE PAIN 42 tablet 0   albuterol (VENTOLIN HFA) 108 (90 Base) MCG/ACT inhaler SMARTSIG:2 Puff(s) By Mouth Every 4-6 Hours PRN     aspirin EC 81 MG tablet Take 81 mg by mouth daily.     atorvastatin (LIPITOR) 80 MG tablet TAKE 1 TABLET EVERY DAY 90 tablet 2   clopidogrel (PLAVIX) 75 MG tablet TAKE 1 TABLET EVERY DAY 90 tablet 0   furosemide (LASIX) 40 MG tablet Take 1 tablet (40 mg total) by mouth every other day. 90 tablet 2   levothyroxine (SYNTHROID) 125 MCG tablet Take 125 mcg by mouth daily.     losartan (COZAAR) 50 MG tablet TAKE 1 TABLET EVERY DAY (Patient taking differently: Take 50 mg by mouth daily.) 90 tablet 3   minocycline (MINOCIN) 50 MG capsule TAKE 1 CAPSULE EVERY 12 HOURS (Patient taking differently: Take 50 mg by mouth daily.) 180 capsule 2   Omega-3 Fatty Acids (FISH OIL) 1200 MG CPDR Take 1 capsule by mouth daily.     omeprazole (PRILOSEC) 40 MG capsule TAKE 1  CAPSULE EVERY DAY (Patient taking differently: Take 40 mg by mouth daily.) 90 capsule 2   rOPINIRole (REQUIP) 2 MG tablet Take 1 tablet (2 mg total) by mouth at bedtime. 90 tablet 2   senna-docusate (SENOKOT-S) 8.6-50 MG tablet Take 1 tablet by mouth at bedtime as needed for mild constipation. 30 tablet 0   TRELEGY ELLIPTA 100-62.5-25 MCG/ACT AEPB Take 1 puff by mouth daily.     amoxicillin-clavulanate (AUGMENTIN) 875-125 MG tablet Take  1 tablet by mouth 2 (two) times daily. 20 tablet 0   levothyroxine (SYNTHROID) 100 MCG tablet Take 100 mcg by mouth daily.     neomycin-polymyxin-hydrocortisone (CORTISPORIN) 3.5-10000-1 ophthalmic suspension Instill 3 gtts to right ear twice daily for 5 days 7.5 mL 0   ofloxacin (FLOXIN OTIC) 0.3 % OTIC solution Place 5 drops into the right ear daily. 5 mL 0   No facility-administered medications prior to visit.    No Known Allergies  Review of Systems  Constitutional:  Negative for chills, fatigue, fever and unexpected weight change.  HENT:  Positive for ear discharge and ear pain. Negative for congestion, sinus pain and sore throat.   Eyes:  Negative for visual disturbance.  Respiratory:  Positive for shortness of breath. Negative for cough.   Cardiovascular:  Negative for chest pain and palpitations.  Gastrointestinal:  Negative for abdominal pain, blood in stool, constipation, diarrhea, nausea and vomiting.  Endocrine: Negative for polydipsia.  Genitourinary:  Negative for dysuria.  Musculoskeletal:  Negative for back pain.  Skin:  Negative for rash.  Neurological:  Negative for headaches.       Objective:    Physical Exam Vitals reviewed.  Constitutional:      Appearance: Normal appearance. He is ill-appearing.  HENT:     Head: Normocephalic and atraumatic.     Right Ear: Tympanic membrane normal.     Left Ear: Tympanic membrane normal.     Mouth/Throat:     Mouth: Mucous membranes are moist.     Pharynx: Oropharynx is clear.  Eyes:      Conjunctiva/sclera: Conjunctivae normal.     Pupils: Pupils are equal, round, and reactive to light.  Cardiovascular:     Rate and Rhythm: Normal rate and regular rhythm.     Pulses: Normal pulses.     Heart sounds: Normal heart sounds. No murmur heard.    No gallop.  Pulmonary:     Effort: Pulmonary effort is normal. No respiratory distress.     Breath sounds: Normal breath sounds. No wheezing.  Abdominal:     General: Abdomen is flat. Bowel sounds are normal. There is no distension.     Tenderness: There is no abdominal tenderness.  Musculoskeletal:     Cervical back: Normal range of motion.  Skin:    General: Skin is warm.     Capillary Refill: Capillary refill takes less than 2 seconds.  Neurological:     General: No focal deficit present.     Mental Status: He is alert.     BP 120/70   Pulse 93   Temp 98.1 F (36.7 C)   Resp 15   Ht '5\' 5"'  (1.651 m)   Wt 189 lb (85.7 kg)   SpO2 95%   BMI 31.45 kg/m  Wt Readings from Last 3 Encounters:  07/27/21 189 lb (85.7 kg)  06/25/21 206 lb (93.4 kg)  06/14/21 192 lb (87.1 kg)    Health Maintenance Due  Topic Date Due   Hepatitis C Screening  Never done   TETANUS/TDAP  Never done   Zoster Vaccines- Shingrix (1 of 2) Never done   COVID-19 Vaccine (3 - Moderna risk series) 07/14/2020   COLONOSCOPY (Pts 45-9yr Insurance coverage will need to be confirmed)  04/26/2021    There are no preventive care reminders to display for this patient.   Lab Results  Component Value Date   TSH 1.030 04/22/2021   Lab Results  Component Value Date   WBC 6.6  04/22/2021   HGB 10.5 (L) 04/22/2021   HCT 33.9 (L) 04/22/2021   MCV 84 04/22/2021   PLT 342 04/22/2021   Lab Results  Component Value Date   NA 144 04/22/2021   K 4.7 04/22/2021   CO2 26 04/22/2021   GLUCOSE 102 (H) 04/22/2021   BUN 17 04/22/2021   CREATININE 1.01 04/22/2021   BILITOT 0.4 04/22/2021   ALKPHOS 90 04/22/2021   AST 26 04/22/2021   ALT 22 04/22/2021    PROT 6.4 04/22/2021   ALBUMIN 4.0 04/22/2021   CALCIUM 9.5 04/22/2021   ANIONGAP 11 03/30/2021   EGFR 78 04/22/2021   Lab Results  Component Value Date   CHOL 101 04/22/2021   Lab Results  Component Value Date   HDL 42 04/22/2021   Lab Results  Component Value Date   LDLCALC 46 04/22/2021   Lab Results  Component Value Date   TRIG 53 04/22/2021   Lab Results  Component Value Date   CHOLHDL 2.4 04/22/2021   Lab Results  Component Value Date   HGBA1C 5.8 (H) 03/30/2021       Assessment & Plan:   Problem List Items Addressed This Visit       Nervous and Auditory   Otitis media, chronic with perforation   Relevant Medications   amoxicillin-clavulanate (AUGMENTIN) 875-125 MG tablet   Other Relevant Orders   Anaerobic and Aerobic Culture History of current otitis media in the right ear he is having purulence in the ear canal but I was unable to clear unable to clear the purulence way to get a good look on the tympanic membrane.  Strongly suspect it has been perforated.  We will give 14-day course of Augmentin as well as using ciprofloxacin eardrops follow-up in 2 weeks.            Other Visit Diagnoses              Ciprofloxacin HCl 0.2 % otic solution            Meds ordered this encounter  Medications   amoxicillin-clavulanate (AUGMENTIN) 875-125 MG tablet    Sig: Take 1 tablet by mouth 2 (two) times daily.    Dispense:  30 tablet    Refill:  0   Ciprofloxacin HCl 0.2 % otic solution    Sig: Place 0.2 mLs into the right ear 2 (two) times daily.    Dispense:  14 each    Refill:  0    Orders Placed This Encounter  Procedures   Anaerobic and Aerobic Culture     Follow-up: Return in about 2 weeks (around 08/10/2021) for hannon or myself.  An After Visit Summary was printed and given to the patient.  Reinaldo Meeker, MD [facility] [phone]

## 2021-07-29 ENCOUNTER — Other Ambulatory Visit: Payer: Self-pay | Admitting: Legal Medicine

## 2021-08-03 LAB — ANAEROBIC AND AEROBIC CULTURE

## 2021-08-03 NOTE — Progress Notes (Signed)
Cultures of ear negative lp

## 2021-08-10 ENCOUNTER — Ambulatory Visit (INDEPENDENT_AMBULATORY_CARE_PROVIDER_SITE_OTHER): Payer: Medicare PPO | Admitting: Nurse Practitioner

## 2021-08-10 ENCOUNTER — Encounter: Payer: Self-pay | Admitting: Nurse Practitioner

## 2021-08-10 VITALS — BP 130/82 | HR 96 | Temp 97.0°F | Resp 15 | Ht 65.0 in | Wt 183.0 lb

## 2021-08-10 DIAGNOSIS — H6121 Impacted cerumen, right ear: Secondary | ICD-10-CM

## 2021-08-10 DIAGNOSIS — Z8669 Personal history of other diseases of the nervous system and sense organs: Secondary | ICD-10-CM | POA: Diagnosis not present

## 2021-08-10 MED ORDER — CARBAMIDE PEROXIDE 6.5 % OT SOLN: 5.0000 [drp] | Freq: Once | OTIC | Status: DC

## 2021-08-19 ENCOUNTER — Ambulatory Visit: Payer: Medicare PPO | Admitting: Nurse Practitioner

## 2021-08-19 ENCOUNTER — Ambulatory Visit: Payer: Medicare PPO

## 2021-08-24 ENCOUNTER — Encounter: Payer: Self-pay | Admitting: Legal Medicine

## 2021-08-24 ENCOUNTER — Ambulatory Visit (INDEPENDENT_AMBULATORY_CARE_PROVIDER_SITE_OTHER): Payer: Medicare PPO | Admitting: Legal Medicine

## 2021-08-24 VITALS — BP 130/80 | HR 65 | Temp 98.7°F | Resp 15 | Ht 65.0 in | Wt 181.0 lb

## 2021-08-24 DIAGNOSIS — H6123 Impacted cerumen, bilateral: Secondary | ICD-10-CM | POA: Insufficient documentation

## 2021-08-24 DIAGNOSIS — J984 Other disorders of lung: Secondary | ICD-10-CM

## 2021-08-24 DIAGNOSIS — G2581 Restless legs syndrome: Secondary | ICD-10-CM | POA: Diagnosis not present

## 2021-08-24 DIAGNOSIS — H906 Mixed conductive and sensorineural hearing loss, bilateral: Secondary | ICD-10-CM

## 2021-08-24 DIAGNOSIS — I5032 Chronic diastolic (congestive) heart failure: Secondary | ICD-10-CM | POA: Diagnosis not present

## 2021-08-24 DIAGNOSIS — I70219 Atherosclerosis of native arteries of extremities with intermittent claudication, unspecified extremity: Secondary | ICD-10-CM

## 2021-08-24 DIAGNOSIS — H3412 Central retinal artery occlusion, left eye: Secondary | ICD-10-CM | POA: Diagnosis not present

## 2021-08-24 DIAGNOSIS — I110 Hypertensive heart disease with heart failure: Secondary | ICD-10-CM | POA: Diagnosis not present

## 2021-08-24 DIAGNOSIS — E782 Mixed hyperlipidemia: Secondary | ICD-10-CM | POA: Diagnosis not present

## 2021-08-24 DIAGNOSIS — E038 Other specified hypothyroidism: Secondary | ICD-10-CM

## 2021-08-24 DIAGNOSIS — Z6830 Body mass index (BMI) 30.0-30.9, adult: Secondary | ICD-10-CM

## 2021-08-24 NOTE — Progress Notes (Signed)
Subjective:  Patient ID: James Richards, male    DOB: 1945/09/26  Age: 76 y.o. MRN: [id number]  Chief Complaint  Patient presents with   Hypothyroidism   Hyperlipidemia   Hypertension    HPI: chronic Hypothyroidism: Patient is taking Levothyroxine 125 mcg daily.  Patient presents for follow up of hypertension.  Patient tolerating Losartan 50 mg daily, Aspirin 81 mg daily, Furosemide 40 mg every other day well without side effects.  Patient was diagnosed with hypertension and has been treated for hypertension for 12 years.Patient is working on maintaining diet and exercise regimen and follows up as directed.  Patient presents with hyperlipidemia.  Compliance with treatment has been good; patient takes medicines as directed, maintains low cholesterol diet, follows up as directed, and maintains exercise regimen.  Patient is using Atorvastatin 80 mg daily without problems.  Current Outpatient Medications on File Prior to Visit  Medication Sig Dispense Refill   acetaminophen-codeine (TYLENOL #3) 300-30 MG tablet TAKE 1 TABLET EVERY 4 HOURS AS NEEDED FOR MODERATE PAIN 42 tablet 0   albuterol (VENTOLIN HFA) 108 (90 Base) MCG/ACT inhaler SMARTSIG:2 Puff(s) By Mouth Every 4-6 Hours PRN     aspirin EC 81 MG tablet Take 81 mg by mouth daily.     atorvastatin (LIPITOR) 80 MG tablet TAKE 1 TABLET EVERY DAY 90 tablet 2   clopidogrel (PLAVIX) 75 MG tablet TAKE 1 TABLET EVERY DAY 90 tablet 0   furosemide (LASIX) 40 MG tablet Take 1 tablet (40 mg total) by mouth every other day. 90 tablet 2   levothyroxine (SYNTHROID) 125 MCG tablet TAKE 1 TABLET EVERY DAY 90 tablet 1   losartan (COZAAR) 50 MG tablet TAKE 1 TABLET EVERY DAY (Patient taking differently: Take 50 mg by mouth daily.) 90 tablet 3   minocycline (MINOCIN) 50 MG capsule TAKE 1 CAPSULE EVERY 12 HOURS (Patient taking differently: Take 50 mg by mouth daily.) 180 capsule 2   Omega-3 Fatty Acids (FISH OIL) 1200 MG CPDR Take 1 capsule by mouth daily.      omeprazole (PRILOSEC) 40 MG capsule Take 1 capsule (40 mg total) by mouth daily. 90 capsule 1   rOPINIRole (REQUIP) 2 MG tablet Take 1 tablet (2 mg total) by mouth at bedtime. 90 tablet 2   senna-docusate (SENOKOT-S) 8.6-50 MG tablet Take 1 tablet by mouth at bedtime as needed for mild constipation. 30 tablet 0   TRELEGY ELLIPTA 100-62.5-25 MCG/ACT AEPB Take 1 puff by mouth daily.     No current facility-administered medications on file prior to visit.   Past Medical History:  Diagnosis Date   Atherosclerosis of native arteries of the extremities with intermittent claudication 10/09/2013   Atherosclerotic peripheral vascular disease with intermittent claudication ([place]) 10/03/2012   BMI 33.0-33.9,adult 07/07/2019   Carotid stenosis 23/76/2831   Diastolic dysfunction 51/76/1607   Essential hypertension    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   History of recurrent TIAs 12/11/2014   Impacted cerumen 11/26/2019   Malignant neoplasm of base of tongue ([place]) 01/21/2015   Malignant neoplasm of larynx, unspecified (HCC)    Migraine 04/02/2014   Mixed hyperlipidemia    Other fatigue    Other specified hypothyroidism    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    Restless legs syndrome    Restrictive lung disease 11/26/2019   TIA (transient ischemic attack) 11/24/2014   Vision exam with abnormal findings concern for left retinal artery occlusion 03/29/2021   Past Surgical History:  Procedure Laterality Date  LYMPHADENECTOMY     PERIPHERAL VASCULAR CATHETERIZATION N/A 11/24/2014   Procedure: Aortic Arch Angiography;  Surgeon: Angelia Mould, MD;  Location: [facility];  Service: Cardiovascular;  Laterality: N/A;   PERIPHERAL VASCULAR CATHETERIZATION N/A 11/24/2014   Procedure: Cerebral Angiography;  Surgeon: Angelia Mould, MD;  Location: [facility];  Service: Cardiovascular;  Laterality: N/A;   THROAT SURGERY      TONSILECTOMY/ADENOIDECTOMY WITH MYRINGOTOMY      Family History  Problem Relation Age of Onset   Other Father        amputation   Deep vein thrombosis Father    Heart disease Father        Atrial Fib.   Cancer Sister    Hypertension Brother    Social History   Socioeconomic History   Marital status: Divorced    Spouse name: Not on file   Number of children: Not on file   Years of education: Not on file   Highest education level: Not on file  Occupational History   Not on file  Tobacco Use   Smoking status: Former    Packs/day: 1.00    Years: 41.00    Total pack years: 41.00    Types: Cigarettes    Quit date: 02/15/2007    Years since quitting: 14.5   Smokeless tobacco: Former    Types: Nurse, children's Use: Never used  Substance and Sexual Activity   Alcohol use: No    Alcohol/week: 0.0 standard drinks of alcohol   Drug use: No   Sexual activity: Not Currently    Partners: Female  Other Topics Concern   Not on file  Social History Narrative   Not on file   Social Determinants of Health   Financial Resource Strain: Low Risk  (06/22/2021)   Overall Financial Resource Strain (CARDIA)    Difficulty of Paying Living Expenses: Not hard at all  Food Insecurity: No Food Insecurity (06/22/2021)   Hunger Vital Sign    Worried About Running Out of Food in the Last Year: Never true    [place] in the Last Year: Never true  Transportation Needs: No Transportation Needs (06/22/2021)   PRAPARE - Hydrologist (Medical): No    Lack of Transportation (Non-Medical): No  Physical Activity: Inactive (01/28/2020)   Exercise Vital Sign    Days of Exercise per Week: 0 days    Minutes of Exercise per Session: 0 min  Stress: No Stress Concern Present (01/28/2020)   [place]    Feeling of Stress : Not at all  Social Connections: [place] (06/22/2021)   Social  Connection and Isolation Panel [NHANES]    Frequency of Communication with Friends and Family: More than three times a week    Frequency of Social Gatherings with Friends and Family: Twice a week    Attends Religious Services: More than 4 times per year    Active Member of [employer] or Organizations: Yes    Attends Music therapist: More than 4 times per year    Marital Status: Married    Review of Systems  Constitutional:  Negative for chills, fatigue, fever and unexpected weight change.  HENT:  Negative for congestion, ear pain, sinus pain and sore throat.   Eyes:        Stroke od  Respiratory:  Positive for shortness of breath. Negative for  cough.   Cardiovascular:  Negative for chest pain and palpitations.  Gastrointestinal:  Negative for abdominal pain, blood in stool, constipation, diarrhea, nausea and vomiting.  Endocrine: Negative for polydipsia.  Genitourinary:  Negative for dysuria.  Musculoskeletal:  Negative for back pain.  Skin:  Negative for rash.  Neurological:  Negative for headaches.  Psychiatric/Behavioral: Negative.       Objective:  BP 130/80   Pulse 65   Temp 98.7 F (37.1 C)   Resp 15   Ht '5\' 5"'$  (1.651 m)   Wt 181 lb (82.1 kg)   SpO2 94%   BMI 30.12 kg/m      08/24/2021   10:05 AM 08/10/2021    2:36 PM 07/27/2021    8:56 AM  BP/Weight  Systolic BP 557 322 025  Diastolic BP 80 82 70  Wt. (Lbs) 181 183 189  BMI 30.12 kg/m2 30.45 kg/m2 31.45 kg/m2    Physical Exam Vitals reviewed.  Constitutional:      General: He is not in acute distress.    Appearance: Normal appearance. He is obese.  HENT:     Head: Normocephalic.     Right Ear: There is impacted cerumen.     Left Ear: There is impacted cerumen.     Nose: No congestion.     Mouth/Throat:     Pharynx: No oropharyngeal exudate.  Eyes:     Extraocular Movements: Extraocular movements intact.     Conjunctiva/sclera: Conjunctivae normal.     Pupils: Pupils are equal, round, and  reactive to light.  Cardiovascular:     Rate and Rhythm: Normal rate and regular rhythm.     Pulses: Normal pulses.     Heart sounds: Normal heart sounds. No murmur heard.    No gallop.  Pulmonary:     Effort: Pulmonary effort is normal. No respiratory distress.     Breath sounds: Normal breath sounds. No wheezing.  Abdominal:     General: Abdomen is flat. Bowel sounds are normal. There is no distension.     Palpations: Abdomen is soft.     Tenderness: There is no abdominal tenderness.  Musculoskeletal:        General: Normal range of motion.     Cervical back: Normal range of motion.     Right lower leg: Edema present.     Left lower leg: Edema present.  Skin:    General: Skin is warm.     Capillary Refill: Capillary refill takes less than 2 seconds.  Neurological:     General: No focal deficit present.     Mental Status: He is alert and oriented to person, place, and time. Mental status is at baseline.     Gait: Gait normal.  Psychiatric:        Mood and Affect: Mood normal.        Thought Content: Thought content normal.         Lab Results  Component Value Date   WBC 6.6 04/22/2021   HGB 10.5 (L) 04/22/2021   HCT 33.9 (L) 04/22/2021   PLT 342 04/22/2021   GLUCOSE 102 (H) 04/22/2021   CHOL 101 04/22/2021   TRIG 53 04/22/2021   HDL 42 04/22/2021   LDLCALC 46 04/22/2021   ALT 22 04/22/2021   AST 26 04/22/2021   NA 144 04/22/2021   K 4.7 04/22/2021   CL 104 04/22/2021   CREATININE 1.01 04/22/2021   BUN 17 04/22/2021   CO2 26 04/22/2021   TSH  1.030 04/22/2021   INR 1.0 03/29/2021   HGBA1C 5.8 (H) 03/30/2021      Assessment & Plan:   Problem List Items Addressed This Visit       Cardiovascular and Mediastinum   Atherosclerosis of native artery of extremity with intermittent claudication (HCC) No recent claudication    Hypertensive heart disease with chronic diastolic congestive heart failure ([place])   Relevant Orders   Comprehensive metabolic panel    CBC with Differential/Platelet An individualized care plan was established and reinforced.  The patient's disease status was assessed using clinical finding son exam today, labs, and/or other diagnostic testing such as x-rays, to determine the patient's success in meeting treatmentgoalsbased on disease-based guidelines and found to beimproving. But not at goal yet. Medications prescriptions no changes Laboratory tests ordered to be performed today include routine. RECOMMENDATIONS: given include follow up with cardiology.  Call physician is patient gains 3 lbs in one day or 5 lbs for one week.  Call for progressive PND, orthopnea or increased pedal edema.    Central retinal artery occlusion of left eye Can see a little in left eye     Respiratory   Restrictive lung disease An individualize plan was formulated for care of COPD.  Treatment is evidence based.  She will continue on inhalers, avoid smoking and smoke.  Regular exercise with help with dyspnea. Routine follow ups and medication compliance is needed.      Endocrine   Other specified hypothyroidism - Primary   Relevant Orders   TSH Patient is known to have hypothyroidism and is n treatment with levothyroxine 190mg.  Patient was diagnosed 10 years ago.  Other treatment includes no surgery.  Patient is compliant with medicines and last TSH 6 months ago.  Last TSH was normal.      Nervous and Auditory   Mixed hearing loss Chronic healing loss, no hearing aids    Hearing loss of both ears due to cerumen impaction Patient to return for ear lavage     Other   Mixed hyperlipidemia   Relevant Orders   Lipid panel AN INDIVIDUAL CARE PLAN for hyperlipidemia/ cholesterol was established and reinforced today.  The patient's status was assessed using clinical findings on exam, lab and other diagnostic tests. The patient's disease status was assessed based on evidence-based guidelines and found to be well controlled. MEDICATIONS were  reviewe. SELF MANAGEMENT GOALS have been discussed and patient's success at attaining the goal of low cholesterol was assessed. RECOMMENDATION given include regular exercise 3 days a week and low cholesterol/low fat diet. CLINICAL SUMMARY including written plan to identify barriers unique to the patient due to social or economic  reasons was discussed.     Restless legs syndrome Controlled with medicines    BMI 30.0-30.9,adult An individualize plan was formulated for obesity using patient history and physical exam to encourage weight loss.  An evidence based program was formulated.  Patient is to cut portion size with meals and to plan physical exercise 3 days a week at least 20 minutes.  Weight watchers and other programs are helpful.  Planned amount of weight loss 10 lbs.   .    Orders Placed This Encounter  Procedures   Comprehensive metabolic panel   Lipid panel   CBC with Differential/Platelet   TSH     Follow-up: Return in about 4 months (around 12/25/2021), or follow up one week for ear lavage.  An After Visit Summary was printed and given to the patient.  Reinaldo Meeker, MD [facility] [phone]

## 2021-08-25 LAB — CBC WITH DIFFERENTIAL/PLATELET
Basophils Absolute: 0.1 10*3/uL (ref 0.0–0.2)
Basos: 1 %
EOS (ABSOLUTE): 0.2 10*3/uL (ref 0.0–0.4)
Eos: 4 %
Hematocrit: 43.3 % (ref 37.5–51.0)
Hemoglobin: 14.1 g/dL (ref 13.0–17.7)
Immature Grans (Abs): 0 10*3/uL (ref 0.0–0.1)
Immature Granulocytes: 0 %
Lymphocytes Absolute: 0.9 10*3/uL (ref 0.7–3.1)
Lymphs: 16 %
MCH: 29.3 pg (ref 26.6–33.0)
MCHC: 32.6 g/dL (ref 31.5–35.7)
MCV: 90 fL (ref 79–97)
Monocytes Absolute: 0.6 10*3/uL (ref 0.1–0.9)
Monocytes: 11 %
Neutrophils Absolute: 3.8 10*3/uL (ref 1.4–7.0)
Neutrophils: 68 %
Platelets: 230 10*3/uL (ref 150–450)
RBC: 4.81 x10E6/uL (ref 4.14–5.80)
RDW: 15.3 % (ref 11.6–15.4)
WBC: 5.6 10*3/uL (ref 3.4–10.8)

## 2021-08-25 LAB — COMPREHENSIVE METABOLIC PANEL
ALT: 22 IU/L (ref 0–44)
AST: 25 IU/L (ref 0–40)
Albumin/Globulin Ratio: 2 (ref 1.2–2.2)
Albumin: 4.1 g/dL (ref 3.8–4.8)
Alkaline Phosphatase: 94 IU/L (ref 44–121)
BUN/Creatinine Ratio: 14 (ref 10–24)
BUN: 15 mg/dL (ref 8–27)
Bilirubin Total: 0.5 mg/dL (ref 0.0–1.2)
CO2: 25 mmol/L (ref 20–29)
Calcium: 9.8 mg/dL (ref 8.6–10.2)
Chloride: 102 mmol/L (ref 96–106)
Creatinine, Ser: 1.04 mg/dL (ref 0.76–1.27)
Globulin, Total: 2.1 g/dL (ref 1.5–4.5)
Glucose: 92 mg/dL (ref 70–99)
Potassium: 5.1 mmol/L (ref 3.5–5.2)
Sodium: 141 mmol/L (ref 134–144)
Total Protein: 6.2 g/dL (ref 6.0–8.5)
eGFR: 75 mL/min/{1.73_m2} (ref >59)

## 2021-08-25 LAB — LIPID PANEL
Chol/HDL Ratio: 2.9 ratio (ref 0.0–5.0)
Cholesterol, Total: 113 mg/dL (ref 100–199)
HDL: 39 mg/dL — ABNORMAL LOW (ref >39)
LDL Chol Calc (NIH): 57 mg/dL (ref 0–99)
Triglycerides: 89 mg/dL (ref 0–149)
VLDL Cholesterol Cal: 17 mg/dL (ref 5–40)

## 2021-08-25 LAB — CARDIOVASCULAR RISK ASSESSMENT

## 2021-08-25 LAB — TSH: TSH: 0.774 u[IU]/mL (ref 0.450–4.500)

## 2021-08-25 NOTE — Progress Notes (Signed)
Cholesterol normal, kidney and liver tests normal, cbc normal, TSH 0.774 normal,  lp

## 2021-08-26 ENCOUNTER — Encounter: Payer: Self-pay | Admitting: Family Medicine

## 2021-08-26 ENCOUNTER — Ambulatory Visit: Payer: Medicare PPO | Admitting: Family Medicine

## 2021-08-26 VITALS — BP 130/80 | Ht 65.0 in | Wt 181.0 lb

## 2021-08-26 DIAGNOSIS — Z0000 Encounter for general adult medical examination without abnormal findings: Secondary | ICD-10-CM

## 2021-08-26 NOTE — Progress Notes (Signed)
I connected with  Tresa Endo on 08/26/21 by a audio enabled telemedicine application and verified that I am speaking with the correct person using two identifiers.  Patient Location: Home  Provider Location: Home Office  I discussed the limitations of evaluation and management by telemedicine. The patient expressed understanding and agreed to proceed.   Subjective:   James Richards is a 76 y.o. male who presents for Medicare Annual/Subsequent preventive examination.   Cardiac Risk Factors include: advanced age (>73mn, >>4women);dyslipidemia;sedentary lifestyle;obesity (BMI >30kg/m2);hypertension     Objective:    Today's Vitals   08/26/21 1411  BP: 130/80  Weight: 181 lb (82.1 kg)  Height: '5\' 5"'$  (1.651 m)   Body mass index is 30.12 kg/m.     08/26/2021    2:15 PM 03/29/2021   12:06 PM 01/28/2020    9:51 AM 11/24/2014    6:08 AM 10/09/2013   11:12 AM  Advanced Directives  Does Patient Have a Medical Advance Directive? Yes No Yes No Yes  Type of AParamedicof ACatlettLiving will  Living will;Healthcare Power of ATyheein Chart? No - copy requested  No - copy requested  No - copy requested  Would patient like information on creating a medical advance directive?  No - Patient declined  No - patient declined information     Current Medications (verified) Outpatient Encounter Medications as of 08/26/2021  Medication Sig   acetaminophen-codeine (TYLENOL #3) 300-30 MG tablet TAKE 1 TABLET EVERY 4 HOURS AS NEEDED FOR MODERATE PAIN   albuterol (VENTOLIN HFA) 108 (90 Base) MCG/ACT inhaler SMARTSIG:2 Puff(s) By Mouth Every 4-6 Hours PRN   aspirin EC 81 MG tablet Take 81 mg by mouth daily.   atorvastatin (LIPITOR) 80 MG tablet TAKE 1 TABLET EVERY DAY   clopidogrel (PLAVIX) 75 MG tablet TAKE 1 TABLET EVERY DAY   furosemide (LASIX) 40 MG tablet Take 1 tablet (40 mg total) by mouth  every other day.   levothyroxine (SYNTHROID) 125 MCG tablet TAKE 1 TABLET EVERY DAY   losartan (COZAAR) 50 MG tablet TAKE 1 TABLET EVERY DAY (Patient taking differently: Take 50 mg by mouth daily.)   minocycline (MINOCIN) 50 MG capsule TAKE 1 CAPSULE EVERY 12 HOURS (Patient taking differently: Take 50 mg by mouth daily.)   Omega-3 Fatty Acids (FISH OIL) 1200 MG CPDR Take 1 capsule by mouth daily.   omeprazole (PRILOSEC) 40 MG capsule Take 1 capsule (40 mg total) by mouth daily.   rOPINIRole (REQUIP) 2 MG tablet Take 1 tablet (2 mg total) by mouth at bedtime.   senna-docusate (SENOKOT-S) 8.6-50 MG tablet Take 1 tablet by mouth at bedtime as needed for mild constipation.   TRELEGY ELLIPTA 100-62.5-25 MCG/ACT AEPB Take 1 puff by mouth daily.   No facility-administered encounter medications on file as of 08/26/2021.    Allergies (verified) Patient has no known allergies.   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, unspecified ([place]  Migraine 04/02/2014   Mixed hyperlipidemia    Other fatigue    Other specified hypothyroidism    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    Restless legs syndrome    Restrictive lung disease 11/26/2019   TIA (transient ischemic attack) 11/24/2014   Vision exam with abnormal findings concern for left retinal artery occlusion 03/29/2021   Past Surgical History:  Procedure Laterality Date   LYMPHADENECTOMY      PERIPHERAL VASCULAR CATHETERIZATION N/A 11/24/2014   Procedure: Aortic Arch Angiography;  Surgeon: Angelia Mould, MD;  Location: [facility];  Service: Cardiovascular;  Laterality: N/A;   PERIPHERAL VASCULAR CATHETERIZATION N/A 11/24/2014   Procedure: Cerebral Angiography;  Surgeon: Angelia Mould, MD;  Location: [place] [facility];  Service: Cardiovascular;  Laterality: N/A;   THROAT SURGERY     TONSILECTOMY/ADENOIDECTOMY WITH MYRINGOTOMY     Family History  Problem Relation Age of Onset   Other Father        amputation   Deep vein thrombosis Father    Heart disease Father        Atrial Fib.   Cancer Sister    Hypertension Brother    Social History   Socioeconomic History   Marital status: Divorced    Spouse name: Not on file   Number of children: Not on file   Years of education: Not on file   Highest education level: Not on file  Occupational History   Not on file  Tobacco Use   Smoking status: Former    Packs/day: 1.00    Years: 41.00    Total pack years: 41.00    Types: Cigarettes    Quit date: 02/15/2007    Years since quitting: 14.5   Smokeless tobacco: Former    Types: Nurse, children's Use: Never used  Substance and Sexual Activity   Alcohol use: No    Alcohol/week: 0.0 standard drinks of alcohol   Drug use: No   Sexual activity: Not Currently    Partners: Female  Other Topics Concern   Not on file  Social History Narrative   Not on file   Social Determinants of Health   Financial Resource Strain: Low Risk  (08/26/2021)   Overall Financial Resource Strain (CARDIA)    Difficulty of Paying Living Expenses: Not hard at all  Food Insecurity: No Food Insecurity (08/26/2021)   Hunger Vital Sign    Worried About Running Out of Food in the Last Year: Never true    [place] in the Last Year: Never true  Transportation Needs: No Transportation Needs (08/26/2021)   PRAPARE - Hydrologist (Medical):  No    Lack of Transportation (Non-Medical): No  Physical Activity: Inactive (08/26/2021)   Exercise Vital Sign    Days of Exercise per Week: 0 days    Minutes of Exercise per Session: 0 min  Stress: No Stress Concern Present (08/26/2021)   [place]    Feeling of Stress : Not at all  Social Connections: Moderately Isolated (08/26/2021)   Social Connection and Isolation Panel [NHANES]    Frequency of Communication with Friends and Family: More than three times a week    Frequency of Social Gatherings with Friends and Family: More than three times a week    Attends Religious Services: More than 4 times per year    Active Member of Clubs or  Organizations: No    Attends Music therapist: Never    Marital Status: Divorced    Tobacco Counseling Counseling given: Not Answered   Clinical Intake:  Pre-visit preparation completed: No  Pain : No/denies pain     Nutritional Risks: None Diabetes: No  How often do you need to have someone help you when you read instructions, pamphlets, or other written materials from your doctor or pharmacy?: 1 - Never What is the last grade level you completed in school?: 12  Diabetic? No   Interpreter Needed?: No      Activities of Daily Living    08/26/2021    2:16 PM 03/30/2021    2:34 AM  In your present state of health, do you have any difficulty performing the following activities:  Hearing? 1 0  Vision? 1 1  Difficulty concentrating or making decisions? 0 0  Walking or climbing stairs? 0 1  Dressing or bathing? 0 0  Doing errands, shopping? 0 0  Preparing Food and eating ? N   Using the Toilet? N   In the past six months, have you accidently leaked urine? N   Do you have problems with loss of bowel control? N   Managing your Medications? N   Managing your Finances? N   Housekeeping or managing your Housekeeping? N     Patient Care Team: Rip Harbour, NP as PCP - General (Nurse Practitioner)  Indicate any recent Medical Services you may have received from other than [facility] providers in the past year (date may be approximate).     Assessment:   This is a routine wellness examination for Geramy.  Hearing/Vision screen No results found.  Dietary issues and exercise activities discussed: Current Exercise Habits: The patient does not participate in regular exercise at present   Goals Addressed   None   Depression Screen    08/26/2021    2:14 PM 04/07/2021    3:01 PM 01/28/2020    9:50 AM 07/05/2019    8:57 AM  PHQ 2/9 Scores  PHQ - 2 Score 0 0 0 0    Fall Risk    08/26/2021    2:15 PM 06/22/2021    1:11 PM 04/07/2021    3:01 PM 01/28/2020    9:51 AM 07/05/2019    8:57 AM  [place] in the past year? 0 0 0 0   Number falls in past yr: 0  0 0 0  Injury with Fall? 0  0 0 0  Risk for fall due to : No Fall Risks  No Fall Risks No Fall Risks   Follow up Falls evaluation completed;Education provided;Falls prevention discussed  Education provided  Falls evaluation completed    FALL RISK PREVENTION PERTAINING TO THE HOME:  Any stairs in or around the home? Yes  If so, are there any without handrails? Yes  Home free of loose throw rugs in walkways, pet beds, electrical cords, etc? Yes  Adequate lighting in your home to reduce risk of falls? Yes   ASSISTIVE DEVICES UTILIZED TO PREVENT FALLS:  Life alert? No  Use of a cane, walker or w/c? No  Grab bars in the bathroom? Yes  Shower chair or bench in shower? Yes  Elevated toilet seat or a handicapped toilet? No     Cognitive Function:        08/26/2021    2:17 PM 01/28/2020    9:53 AM  6CIT Screen  What Year?  0 points 0 points  What month? 0 points 0 points  What time? 0 points 0 points  Count back from 20 0 points 0 points  Months in reverse 2 points 2 points  Repeat phrase 0 points 0 points  Total Score 2 points 2 points     Immunizations Immunization History  Administered Date(s) Administered   Fluad Quad(high Dose 65+) 11/14/2019, 12/16/2020   Influenza-Unspecified 11/20/2017, 11/02/2018   Moderna SARS-COV2 Booster Vaccination 12/31/2019, 06/16/2020   Moderna Sars-Covid-2 Vaccination 04/11/2019, 05/13/2019   Pneumococcal Conjugate-13 11/11/2013   Pneumococcal Polysaccharide-23 12/08/2016   Zoster, Live 06/12/2013    TDAP status: Due, Education has been provided regarding the importance of this vaccine. Advised may receive this vaccine at local pharmacy or Health Dept. Aware to provide a copy of the vaccination record if obtained from local pharmacy or Health Dept. Verbalized acceptance and understanding.  Flu Vaccine status: Up to date  Pneumococcal vaccine status: Up to date  Covid-19 vaccine status: Completed vaccines  Qualifies for Shingles Vaccine? Yes   Zostavax completed Yes   Shingrix Completed?: No.    Education has been provided regarding the importance of this vaccine. Patient has been advised to call insurance company to determine out of pocket expense if they have not yet received this vaccine. Advised may also receive vaccine at local pharmacy or Health Dept. Verbalized acceptance and understanding.  Screening Tests Health Maintenance  Topic Date Due   COVID-19 Vaccine (3 - Moderna risk series) 09/11/2021 (Originally 07/14/2020)   Zoster Vaccines- Shingrix (1 of 2) 11/26/2021 (Originally 12/10/1964)   COLONOSCOPY (Pts 45-54yr Insurance coverage will need to be confirmed)  04/27/2022 (Originally 04/26/2021)   TETANUS/TDAP  08/27/2022 (Originally 12/10/1964)   Hepatitis C Screening  08/27/2022 (Originally 12/10/1963)   INFLUENZA VACCINE  09/14/2021   Pneumonia Vaccine 75 Years old  Completed   HPV VACCINES  Aged Out    Health Maintenance  There are no preventive care reminders to display for this patient.   Colorectal cancer screening: Type of screening: Colonoscopy. Completed  2013. Repeat every 10 years  Lung Cancer Screening: (Low Dose CT Chest recommended if Age 75-80years, 30 pack-year currently smoking OR have quit w/in 15years.) does not qualify.   Lung Cancer Screening Referral: n/a  Additional Screening:  Hepatitis C Screening: does qualify;   Vision Screening: Recommended annual ophthalmology exams for early detection of glaucoma and other disorders of the eye. Is the patient up to date with their annual eye exam?  Yes Who is the provider or what is the name of the office in which the patient attends annual eye exams? [facility] is not established with a provider, would they like to be referred to a provider to establish care? N/a.   Dental Screening: Recommended annual dental exams for proper oral hygiene  Community Resource Referral / Chronic Care Management: CRR required this visit?  No   CCM required this visit?  No      Plan:     I have personally reviewed and noted the following in the patient's chart:   Medical and social history Use of alcohol, tobacco or illicit drugs  Current medications and supplements including opioid prescriptions. Patient is not currently taking opioid prescriptions. Functional ability and status Nutritional status Physical activity Advanced directives List of other physicians Hospitalizations, surgeries, and ER visits in previous 12 months Vitals Screenings to include cognitive, depression, and falls Referrals and appointments  In addition, I have reviewed and discussed  with patient certain preventive protocols, quality metrics, and best practice recommendations. A written personalized care plan for preventive services as well as general preventive health recommendations were provided to patient.     Perlie Mayo, NP   08/26/2021

## 2021-08-26 NOTE — Patient Instructions (Signed)
James Richards , Thank you for taking time to come for your Medicare Wellness Visit. I appreciate your ongoing commitment to your health goals. Please review the following plan we discussed and let me know if I can assist you in the future.   Screening recommendations/referrals: Colonoscopy: Due  Recommended yearly ophthalmology/optometry visit for glaucoma screening and checkup Recommended yearly dental visit for hygiene and checkup  Vaccinations: Influenza vaccine: up to date Pneumococcal vaccine: up to date Tdap vaccine: Due  Shingles vaccine: Due     Advanced directives: Need to bring copy for chart  Conditions/risks identified: Falls- are a risk for [place] all, please be mindful of your surrounds  Next appointment: As scheduled   Preventive Care 27 Years and Older, Male Preventive care refers to lifestyle choices and visits with your health care provider that can promote health and wellness. What does preventive care include? A yearly physical exam. This is also called an annual well check. Dental exams once or twice a year. Routine eye exams. Ask your health care provider how often you should have your eyes checked. Personal lifestyle choices, including: Daily care of your teeth and gums. Regular physical activity. Eating a healthy diet. Avoiding tobacco and drug use. Limiting alcohol use. Practicing safe sex. Taking low doses of aspirin every day. Taking vitamin and mineral supplements as recommended by your health care provider. What happens during an annual well check? The services and screenings done by your health care provider during your annual well check will depend on your age, overall health, lifestyle risk factors, and family history of disease. Counseling  Your health care provider may ask you questions about your: Alcohol use. Tobacco use. Drug use. Emotional well-being. Home and relationship well-being. Sexual activity. Eating habits. History of falls. Memory  and ability to understand (cognition). Work and work Statistician. Screening  You may have the following tests or measurements: Height, weight, and BMI. Blood pressure. Lipid and cholesterol levels. These may be checked every 5 years, or more frequently if you are over 58 years old. Skin check. Lung cancer screening. You may have this screening every year starting at age 98 if you have a 30-pack-year history of smoking and currently smoke or have quit within the past 15 years. Fecal occult blood test (FOBT) of the stool. You may have this test every year starting at age 75. Flexible sigmoidoscopy or colonoscopy. You may have a sigmoidoscopy every 5 years or a colonoscopy every 10 years starting at age 72. Prostate cancer screening. Recommendations will vary depending on your family history and other risks. Hepatitis C blood test. Hepatitis B blood test. Sexually transmitted disease (STD) testing. Diabetes screening. This is done by checking your blood sugar (glucose) after you have not eaten for a while (fasting). You may have this done every 1-3 years. Abdominal aortic aneurysm (AAA) screening. You may need this if you are a current or former smoker. Osteoporosis. You may be screened starting at age 43 if you are at high risk. Talk with your health care provider about your test results, treatment options, and if necessary, the need for more tests. Vaccines  Your health care provider may recommend certain vaccines, such as: Influenza vaccine. This is recommended every year. Tetanus, diphtheria, and acellular pertussis (Tdap, Td) vaccine. You may need a Td booster every 10 years. Zoster vaccine. You may need this after age 85. Pneumococcal 13-valent conjugate (PCV13) vaccine. One dose is recommended after age 36. Pneumococcal polysaccharide (PPSV23) vaccine. One dose is recommended after  age 66. Talk to your health care provider about which screenings and vaccines you need and how often you  need them. This information is not intended to replace advice given to you by your health care provider. Make sure you discuss any questions you have with your health care provider. Document Released: 02/27/2015 Document Revised: 10/21/2015 Document Reviewed: 12/02/2014 Elsevier Interactive Patient Education  2017 [place] Prevention in the Home Falls can cause injuries. They can happen to people of all ages. There are many things you can do to make your home safe and to help prevent falls. What can I do on the outside of my home? Regularly fix the edges of walkways and driveways and fix any cracks. Remove anything that might make you trip as you walk through a door, such as a raised step or threshold. Trim any bushes or trees on the path to your home. Use bright outdoor lighting. Clear any walking paths of anything that might make someone trip, such as rocks or tools. Regularly check to see if handrails are loose or broken. Make sure that both sides of any steps have handrails. Any raised decks and porches should have guardrails on the edges. Have any leaves, snow, or ice cleared regularly. Use sand or salt on walking paths during winter. Clean up any spills in your garage right away. This includes oil or grease spills. What can I do in the bathroom? Use night lights. Install grab bars by the toilet and in the tub and shower. Do not use towel bars as grab bars. Use non-skid mats or decals in the tub or shower. If you need to sit down in the shower, use a plastic, non-slip stool. Keep the floor dry. Clean up any water that spills on the floor as soon as it happens. Remove soap buildup in the tub or shower regularly. Attach bath mats securely with double-sided non-slip rug tape. Do not have throw rugs and other things on the floor that can make you trip. What can I do in the bedroom? Use night lights. Make sure that you have a light by your bed that is easy to reach. Do not  use any sheets or blankets that are too big for your bed. They should not hang down onto the floor. Have a firm chair that has side arms. You can use this for support while you get dressed. Do not have throw rugs and other things on the floor that can make you trip. What can I do in the kitchen? Clean up any spills right away. Avoid walking on wet floors. Keep items that you use a lot in easy-to-reach places. If you need to reach something above you, use a strong step stool that has a grab bar. Keep electrical cords out of the way. Do not use floor polish or wax that makes floors slippery. If you must use wax, use non-skid floor wax. Do not have throw rugs and other things on the floor that can make you trip. What can I do with my stairs? Do not leave any items on the stairs. Make sure that there are handrails on both sides of the stairs and use them. Fix handrails that are broken or loose. Make sure that handrails are as long as the stairways. Check any carpeting to make sure that it is firmly attached to the stairs. Fix any carpet that is loose or worn. Avoid having throw rugs at the top or bottom of the stairs. If you do  have throw rugs, attach them to the floor with carpet tape. Make sure that you have a light switch at the top of the stairs and the bottom of the stairs. If you do not have them, ask someone to add them for you. What else can I do to help prevent falls? Wear shoes that: Do not have high heels. Have rubber bottoms. Are comfortable and fit you well. Are closed at the toe. Do not wear sandals. If you use a stepladder: Make sure that it is fully opened. Do not climb a closed stepladder. Make sure that both sides of the stepladder are locked into place. Ask someone to hold it for you, if possible. Clearly mark and make sure that you can see: Any grab bars or handrails. First and last steps. Where the edge of each step is. Use tools that help you move around (mobility  aids) if they are needed. These include: Canes. Walkers. Scooters. Crutches. Turn on the lights when you go into a dark area. Replace any light bulbs as soon as they burn out. Set up your furniture so you have a clear path. Avoid moving your furniture around. If any of your floors are uneven, fix them. If there are any pets around you, be aware of where they are. Review your medicines with your doctor. Some medicines can make you feel dizzy. This can increase your chance of falling. Ask your doctor what other things that you can do to help prevent falls. This information is not intended to replace advice given to you by your health care provider. Make sure you discuss any questions you have with your health care provider. Document Released: 11/27/2008 Document Revised: 07/09/2015 Document Reviewed: 03/07/2014 Elsevier Interactive Patient Education  2017 [employer].

## 2021-08-31 ENCOUNTER — Ambulatory Visit (INDEPENDENT_AMBULATORY_CARE_PROVIDER_SITE_OTHER): Payer: Medicare PPO

## 2021-08-31 DIAGNOSIS — H6123 Impacted cerumen, bilateral: Secondary | ICD-10-CM | POA: Diagnosis not present

## 2021-08-31 NOTE — Progress Notes (Signed)
Patient in office for ear irrigation. Successful bilaterally however some cerumen still in ear near ear drum. Patient is to call back in 2-3 weeks.  James Richards 08/31/21 2:47 PM

## 2021-09-08 ENCOUNTER — Ambulatory Visit (INDEPENDENT_AMBULATORY_CARE_PROVIDER_SITE_OTHER): Payer: Medicare PPO | Admitting: Legal Medicine

## 2021-09-08 ENCOUNTER — Ambulatory Visit: Payer: Medicare PPO | Admitting: Nurse Practitioner

## 2021-09-08 ENCOUNTER — Encounter: Payer: Self-pay | Admitting: Legal Medicine

## 2021-09-08 VITALS — BP 112/60 | HR 88 | Temp 98.5°F | Resp 14 | Ht 65.0 in | Wt 179.0 lb

## 2021-09-08 DIAGNOSIS — H66014 Acute suppurative otitis media with spontaneous rupture of ear drum, recurrent, right ear: Secondary | ICD-10-CM | POA: Diagnosis not present

## 2021-09-08 MED ORDER — NEOMYCIN-POLYMYXIN-HC 3.5-10000-1 OT SOLN: 3.0000 [drp] | Freq: Four times a day (QID) | OTIC | 0 refills | Status: DC

## 2021-09-08 MED ORDER — CIPROFLOXACIN HCL 500 MG PO TABS: 500.0000 mg | ORAL_TABLET | Freq: Two times a day (BID) | ORAL | 0 refills | Status: AC

## 2021-09-08 NOTE — Progress Notes (Addendum)
Acute Office Visit  Subjective:    Patient ID: James Richards, male    DOB: 03/21/1945, 76 y.o.   MRN: [id number]  Chief Complaint  Patient presents with   Ear Drainage    Right    HPI: Patient is in today for right ear fullness, pain and drainage since 2 weeks ago. He feels that did not get better since the last visit.right ear still painful.  No feer or chills.  Past Medical History:  Diagnosis Date   Atherosclerosis of native arteries of the extremities with intermittent claudication 10/09/2013   Atherosclerotic peripheral vascular disease with intermittent claudication ([place]) 10/03/2012   BMI 33.0-33.9,adult 07/07/2019   Carotid stenosis 51/11/2109   Diastolic dysfunction 73/56/7014   Essential hypertension    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   History of recurrent TIAs 12/11/2014   Impacted cerumen 11/26/2019   Malignant neoplasm of base of tongue ([place]) 01/21/2015   Malignant neoplasm of larynx, unspecified ([facility])    Migraine 04/02/2014   Mixed hyperlipidemia    Other fatigue    Other specified hypothyroidism    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    Restless legs syndrome    Restrictive lung disease 11/26/2019   TIA (transient ischemic attack) 11/24/2014   Vision exam with abnormal findings concern for left retinal artery occlusion 03/29/2021    Past Surgical History:  Procedure Laterality Date   LYMPHADENECTOMY     PERIPHERAL VASCULAR CATHETERIZATION N/A 11/24/2014   Procedure: Aortic Arch Angiography;  Surgeon: Angelia Mould, MD;  Location: [facility];  Service: Cardiovascular;  Laterality: N/A;   PERIPHERAL VASCULAR CATHETERIZATION N/A 11/24/2014   Procedure: Cerebral Angiography;  Surgeon: Angelia Mould, MD;  Location: [facility];  Service: Cardiovascular;  Laterality: N/A;   THROAT SURGERY     TONSILECTOMY/ADENOIDECTOMY WITH MYRINGOTOMY      Family History  Problem Relation Age of Onset    Other Father        amputation   Deep vein thrombosis Father    Heart disease Father        Atrial Fib.   Cancer Sister    Hypertension Brother     Social History   Socioeconomic History   Marital status: Divorced    Spouse name: Not on file   Number of children: Not on file   Years of education: Not on file   Highest education level: Not on file  Occupational History   Not on file  Tobacco Use   Smoking status: Former    Packs/day: 1.00    Years: 41.00    Total pack years: 41.00    Types: Cigarettes    Quit date: 02/15/2007    Years since quitting: 14.5   Smokeless tobacco: Former    Types: Nurse, children's Use: Never used  Substance and Sexual Activity   Alcohol use: No    Alcohol/week: 0.0 standard drinks of alcohol   Drug use: No   Sexual activity: Not Currently    Partners: Female  Other Topics Concern   Not on file  Social History Narrative   Not on file   Social Determinants of Health   Financial Resource Strain: Low Risk  (08/26/2021)   Overall Financial Resource Strain (CARDIA)    Difficulty of Paying Living Expenses: Not hard at all  Food Insecurity: No Food Insecurity (08/26/2021)   Hunger Vital Sign    Worried About Running Out of Food  in the Last Year: Never true    [place] in the Last Year: Never true  Transportation Needs: No Transportation Needs (08/26/2021)   PRAPARE - Hydrologist (Medical): No    Lack of Transportation (Non-Medical): No  Physical Activity: Inactive (08/26/2021)   Exercise Vital Sign    Days of Exercise per Week: 0 days    Minutes of Exercise per Session: 0 min  Stress: No Stress Concern Present (08/26/2021)   [place]    Feeling of Stress : Not at all  Social Connections: Moderately Isolated (08/26/2021)   Social Connection and Isolation Panel [NHANES]    Frequency of Communication with Friends and Family: More  than three times a week    Frequency of Social Gatherings with Friends and Family: More than three times a week    Attends Religious Services: More than 4 times per year    Active Member of [employer] or Organizations: No    Attends Archivist Meetings: Never    Marital Status: Divorced  Human resources officer Violence: Not At Risk (08/26/2021)   Humiliation, Afraid, Rape, and Kick questionnaire    Fear of Current or Ex-Partner: No    Emotionally Abused: No    Physically Abused: No    Sexually Abused: No    Outpatient Medications Prior to Visit  Medication Sig Dispense Refill   acetaminophen-codeine (TYLENOL #3) 300-30 MG tablet TAKE 1 TABLET EVERY 4 HOURS AS NEEDED FOR MODERATE PAIN 42 tablet 0   albuterol (VENTOLIN HFA) 108 (90 Base) MCG/ACT inhaler SMARTSIG:2 Puff(s) By Mouth Every 4-6 Hours PRN     aspirin EC 81 MG tablet Take 81 mg by mouth daily.     atorvastatin (LIPITOR) 80 MG tablet TAKE 1 TABLET EVERY DAY 90 tablet 2   clopidogrel (PLAVIX) 75 MG tablet TAKE 1 TABLET EVERY DAY 90 tablet 0   furosemide (LASIX) 40 MG tablet Take 1 tablet (40 mg total) by mouth every other day. 90 tablet 2   levothyroxine (SYNTHROID) 125 MCG tablet TAKE 1 TABLET EVERY DAY 90 tablet 1   losartan (COZAAR) 50 MG tablet TAKE 1 TABLET EVERY DAY (Patient taking differently: Take 50 mg by mouth daily.) 90 tablet 3   minocycline (MINOCIN) 50 MG capsule TAKE 1 CAPSULE EVERY 12 HOURS (Patient taking differently: Take 50 mg by mouth daily.) 180 capsule 2   Omega-3 Fatty Acids (FISH OIL) 1200 MG CPDR Take 1 capsule by mouth daily.     omeprazole (PRILOSEC) 40 MG capsule Take 1 capsule (40 mg total) by mouth daily. 90 capsule 1   rOPINIRole (REQUIP) 2 MG tablet Take 1 tablet (2 mg total) by mouth at bedtime. 90 tablet 2   senna-docusate (SENOKOT-S) 8.6-50 MG tablet Take 1 tablet by mouth at bedtime as needed for mild constipation. 30 tablet 0   TRELEGY ELLIPTA 100-62.5-25 MCG/ACT AEPB Take 1 puff by mouth daily.      No facility-administered medications prior to visit.    No Known Allergies  Review of Systems  Constitutional:  Negative for chills, fatigue, fever and unexpected weight change.  HENT:  Positive for ear discharge (Right ear) and ear pain. Negative for congestion, sinus pain and sore throat.   Respiratory:  Positive for shortness of breath. Negative for cough.   Cardiovascular:  Negative for chest pain and palpitations.  Gastrointestinal:  Negative for abdominal pain, blood in stool, constipation, diarrhea, nausea and vomiting.  Endocrine: Negative for polydipsia.  Genitourinary:  Negative for dysuria.  Musculoskeletal:  Negative for back pain.  Skin:  Negative for rash.  Neurological:  Negative for headaches.       Objective:    Physical Exam Vitals reviewed.  Constitutional:      General: He is not in acute distress.    Appearance: Normal appearance.  HENT:     Head: Normocephalic.     Left Ear: Tympanic membrane normal.     Ears:     Comments: Right TM dull and retracted.  Some purulence in canal, no perforation noted    Nose: Congestion present.     Mouth/Throat:     Pharynx: Oropharyngeal exudate present.  Eyes:     Extraocular Movements: Extraocular movements intact.     Conjunctiva/sclera: Conjunctivae normal.     Pupils: Pupils are equal, round, and reactive to light.  Cardiovascular:     Rate and Rhythm: Normal rate and regular rhythm.     Pulses: Normal pulses.     Heart sounds: Normal heart sounds. No murmur heard.    No gallop.  Pulmonary:     Effort: Pulmonary effort is normal. No respiratory distress.     Breath sounds: Normal breath sounds. No wheezing.  Abdominal:     General: Abdomen is flat. Bowel sounds are normal. There is no distension.     Tenderness: There is no abdominal tenderness.  Musculoskeletal:        General: Normal range of motion.     Cervical back: Normal range of motion.  Skin:    General: Skin is warm.     Capillary Refill:  Capillary refill takes less than 2 seconds.  Neurological:     General: No focal deficit present.     Mental Status: He is alert and oriented to person, place, and time. Mental status is at baseline.     BP 112/60   Pulse 88   Temp 98.5 F (36.9 C)   Resp 14   Ht '5\' 5"'  (1.651 m)   Wt 179 lb (81.2 kg)   SpO2 95%   BMI 29.79 kg/m  Wt Readings from Last 3 Encounters:  09/08/21 179 lb (81.2 kg)  08/26/21 181 lb (82.1 kg)  08/24/21 181 lb (82.1 kg)         Lab Results  Component Value Date   TSH 0.774 08/24/2021   Lab Results  Component Value Date   WBC 5.6 08/24/2021   HGB 14.1 08/24/2021   HCT 43.3 08/24/2021   MCV 90 08/24/2021   PLT 230 08/24/2021   Lab Results  Component Value Date   NA 141 08/24/2021   K 5.1 08/24/2021   CO2 25 08/24/2021   GLUCOSE 92 08/24/2021   BUN 15 08/24/2021   CREATININE 1.04 08/24/2021   BILITOT 0.5 08/24/2021   ALKPHOS 94 08/24/2021   AST 25 08/24/2021   ALT 22 08/24/2021   PROT 6.2 08/24/2021   ALBUMIN 4.1 08/24/2021   CALCIUM 9.8 08/24/2021   ANIONGAP 11 03/30/2021   EGFR 75 08/24/2021   Lab Results  Component Value Date   CHOL 113 08/24/2021   Lab Results  Component Value Date   HDL 39 (L) 08/24/2021   Lab Results  Component Value Date   LDLCALC 57 08/24/2021   Lab Results  Component Value Date   TRIG 89 08/24/2021   Lab Results  Component Value Date   CHOLHDL 2.9 08/24/2021   Lab Results  Component Value  Date   HGBA1C 5.8 (H) 03/30/2021       Assessment & Plan:   Problem List Items Addressed This Visit       Nervous and Auditory   Otitis media, purulent, acute, with spontaneous rupture of TM - Primary   Relevant Medications   neomycin-polymyxin-hydrocortisone (CORTISPORIN) OTIC solution   ciprofloxacin (CIPRO) 500 MG tablet Treat ear infection with cortisporin otic and cipro   Meds ordered this encounter  Medications   neomycin-polymyxin-hydrocortisone (CORTISPORIN) OTIC solution    Sig:  Place 3 drops into the right ear 4 (four) times daily.    Dispense:  10 mL    Refill:  0   ciprofloxacin (CIPRO) 500 MG tablet    Sig: Take 1 tablet (500 mg total) by mouth 2 (two) times daily for 10 days.    Dispense:  20 tablet    Refill:  0      Follow-up: No follow-ups on file.  An After Visit Summary was printed and given to the patient.  Reinaldo Meeker, MD [facility] [phone]

## 2021-09-21 DIAGNOSIS — R0600 Dyspnea, unspecified: Secondary | ICD-10-CM | POA: Diagnosis not present

## 2021-09-21 DIAGNOSIS — J84112 Idiopathic pulmonary fibrosis: Secondary | ICD-10-CM | POA: Diagnosis not present

## 2021-09-21 DIAGNOSIS — J4540 Moderate persistent asthma, uncomplicated: Secondary | ICD-10-CM | POA: Diagnosis not present

## 2021-09-21 DIAGNOSIS — R918 Other nonspecific abnormal finding of lung field: Secondary | ICD-10-CM | POA: Diagnosis not present

## 2021-09-22 ENCOUNTER — Other Ambulatory Visit: Payer: Self-pay | Admitting: Legal Medicine

## 2021-09-28 ENCOUNTER — Other Ambulatory Visit: Payer: Self-pay | Admitting: Legal Medicine

## 2021-09-28 DIAGNOSIS — L711 Rhinophyma: Secondary | ICD-10-CM

## 2021-09-28 DIAGNOSIS — G451 Carotid artery syndrome (hemispheric): Secondary | ICD-10-CM

## 2021-09-28 MED ORDER — CLOPIDOGREL BISULFATE 75 MG PO TABS: 75.0000 mg | ORAL_TABLET | Freq: Every day | ORAL | 2 refills | Status: DC

## 2021-09-28 MED ORDER — MINOCYCLINE HCL 50 MG PO CAPS: 50.0000 mg | ORAL_CAPSULE | Freq: Every day | ORAL | 2 refills | Status: DC

## 2021-12-08 ENCOUNTER — Other Ambulatory Visit: Payer: Self-pay | Admitting: Legal Medicine

## 2021-12-08 DIAGNOSIS — G2581 Restless legs syndrome: Secondary | ICD-10-CM

## 2021-12-17 ENCOUNTER — Other Ambulatory Visit: Payer: Self-pay | Admitting: Cardiology

## 2021-12-21 DIAGNOSIS — G4736 Sleep related hypoventilation in conditions classified elsewhere: Secondary | ICD-10-CM | POA: Diagnosis not present

## 2021-12-21 DIAGNOSIS — J84112 Idiopathic pulmonary fibrosis: Secondary | ICD-10-CM | POA: Diagnosis not present

## 2021-12-21 DIAGNOSIS — R918 Other nonspecific abnormal finding of lung field: Secondary | ICD-10-CM | POA: Diagnosis not present

## 2021-12-21 DIAGNOSIS — R0600 Dyspnea, unspecified: Secondary | ICD-10-CM | POA: Diagnosis not present

## 2021-12-21 DIAGNOSIS — J4540 Moderate persistent asthma, uncomplicated: Secondary | ICD-10-CM | POA: Diagnosis not present

## 2021-12-27 ENCOUNTER — Other Ambulatory Visit: Payer: Self-pay | Admitting: Nurse Practitioner

## 2021-12-27 ENCOUNTER — Ambulatory Visit (INDEPENDENT_AMBULATORY_CARE_PROVIDER_SITE_OTHER): Payer: Medicare PPO | Admitting: Nurse Practitioner

## 2021-12-27 ENCOUNTER — Encounter: Payer: Self-pay | Admitting: Nurse Practitioner

## 2021-12-27 VITALS — BP 136/80 | HR 66 | Temp 97.1°F | Ht 65.0 in | Wt 183.0 lb

## 2021-12-27 DIAGNOSIS — I10 Essential (primary) hypertension: Secondary | ICD-10-CM | POA: Diagnosis not present

## 2021-12-27 DIAGNOSIS — J84112 Idiopathic pulmonary fibrosis: Secondary | ICD-10-CM

## 2021-12-27 DIAGNOSIS — Z23 Encounter for immunization: Secondary | ICD-10-CM

## 2021-12-27 DIAGNOSIS — I70219 Atherosclerosis of native arteries of extremities with intermittent claudication, unspecified extremity: Secondary | ICD-10-CM

## 2021-12-27 DIAGNOSIS — I5032 Chronic diastolic (congestive) heart failure: Secondary | ICD-10-CM

## 2021-12-27 DIAGNOSIS — Z9229 Personal history of other drug therapy: Secondary | ICD-10-CM

## 2021-12-27 DIAGNOSIS — I110 Hypertensive heart disease with heart failure: Secondary | ICD-10-CM | POA: Diagnosis not present

## 2021-12-27 DIAGNOSIS — Z9981 Dependence on supplemental oxygen: Secondary | ICD-10-CM

## 2021-12-27 DIAGNOSIS — Z8669 Personal history of other diseases of the nervous system and sense organs: Secondary | ICD-10-CM

## 2021-12-27 DIAGNOSIS — Z125 Encounter for screening for malignant neoplasm of prostate: Secondary | ICD-10-CM | POA: Diagnosis not present

## 2021-12-27 DIAGNOSIS — E782 Mixed hyperlipidemia: Secondary | ICD-10-CM | POA: Diagnosis not present

## 2021-12-27 DIAGNOSIS — J9611 Chronic respiratory failure with hypoxia: Secondary | ICD-10-CM

## 2021-12-27 DIAGNOSIS — Z85810 Personal history of malignant neoplasm of tongue: Secondary | ICD-10-CM

## 2021-12-27 MED ORDER — LOSARTAN POTASSIUM 50 MG PO TABS: 50.0000 mg | ORAL_TABLET | Freq: Every day | ORAL | 3 refills | Status: DC

## 2021-12-27 NOTE — Patient Instructions (Addendum)
We will call you with lab results Continue medications Follow up in 12-month, fasting   Fall Prevention in the Home, Adult Falls can cause injuries and can happen to people of all ages. There are many things you can do to make your home safe and to help prevent falls. Ask for help when making these changes. What actions can I take to prevent falls? General Instructions Use good lighting in all rooms. Replace any light bulbs that burn out. Turn on the lights in dark areas. Use night-lights. Keep items that you use often in easy-to-reach places. Lower the shelves around your home if needed. Set up your furniture so you have a clear path. Avoid moving your furniture around. Do not have throw rugs or other things on the floor that can make you trip. Avoid walking on wet floors. If any of your floors are uneven, fix them. Add color or contrast paint or tape to clearly mark and help you see: Grab bars or handrails. First and last steps of staircases. Where the edge of each step is. If you use a stepladder: Make sure that it is fully opened. Do not climb a closed stepladder. Make sure the sides of the stepladder are locked in place. Ask someone to hold the stepladder while you use it. Know where your pets are when moving through your home. What can I do in the bathroom?     Keep the floor dry. Clean up any water on the floor right away. Remove soap buildup in the tub or shower. Use nonskid mats or decals on the floor of the tub or shower. Attach bath mats securely with double-sided, nonslip rug tape. If you need to sit down in the shower, use a plastic, nonslip stool. Install grab bars by the toilet and in the tub and shower. Do not use towel bars as grab bars. What can I do in the bedroom? Make sure that you have a light by your bed that is easy to reach. Do not use any sheets or blankets for your bed that hang to the floor. Have a firm chair with side arms that you can use for support  when you get dressed. What can I do in the kitchen? Clean up any spills right away. If you need to reach something above you, use a step stool with a grab bar. Keep electrical cords out of the way. Do not use floor polish or wax that makes floors slippery. What can I do with my stairs? Do not leave any items on the stairs. Make sure that you have a light switch at the top and the bottom of the stairs. Make sure that there are handrails on both sides of the stairs. Fix handrails that are broken or loose. Install nonslip stair treads on all your stairs. Avoid having throw rugs at the top or bottom of the stairs. Choose a carpet that does not hide the edge of the steps on the stairs. Check carpeting to make sure that it is firmly attached to the stairs. Fix carpet that is loose or worn. What can I do on the outside of my home? Use bright outdoor lighting. Fix the edges of walkways and driveways and fix any cracks. Remove anything that might make you trip as you walk through a door, such as a raised step or threshold. Trim any bushes or trees on paths to your home. Check to see if handrails are loose or broken and that both sides of all steps have  handrails. Install guardrails along the edges of any raised decks and porches. Clear paths of anything that can make you trip, such as tools or rocks. Have leaves, snow, or ice cleared regularly. Use sand or salt on paths during winter. Clean up any spills in your garage right away. This includes grease or oil spills. What other actions can I take? Wear shoes that: Have a low heel. Do not wear high heels. Have rubber bottoms. Feel good on your feet and fit well. Are closed at the toe. Do not wear open-toe sandals. Use tools that help you move around if needed. These include: Canes. Walkers. Scooters. Crutches. Review your medicines with your doctor. Some medicines can make you feel dizzy. This can increase your chance of falling. Ask your  doctor what else you can do to help prevent falls. Where to find more information Centers for Disease Control and Prevention, [employer]: http://www.wolf.info/ National Institute on Aging: http://kim-miller.com/ Contact a doctor if: You are afraid of falling at home. You feel weak, drowsy, or dizzy at home. You fall at home. Summary There are many simple things that you can do to make your home safe and to help prevent falls. Ways to make your home safe include removing things that can make you trip and installing grab bars in the bathroom. Ask for help when making these changes in your home. This information is not intended to replace advice given to you by your health care provider. Make sure you discuss any questions you have with your health care provider. Document Revised: 11/02/2020 Document Reviewed: 09/04/2019 Elsevier Patient Education  [place].

## 2021-12-27 NOTE — Progress Notes (Signed)
Subjective:  Patient ID: James Richards, male    DOB: July 30, 1945  Age: 76 y.o. MRN: [id number]  Chief Complaint  Patient presents with   Hyperlipidemia   Hypothyroidism   Hypertension    HPI  Pt presents for follow-up of HTN, Hypothyroidism, and GERD. He has idiopathic pulmonary fibrosis with chronic hypoxia, O2 dependence at night. He is followed by Dr Loura Back, pulmonary specialist. Reports chronic dyspnea and cough. Treatment includes Albuterol and Symbicort. His significant other states he is to be prescribed a new maintenance inhaler today. She tells me she will notify the office of the name and dose on new inhaler. He is a former cigarette smoker, quit 2009. He has hearing loss bilaterally, states he reads lips mostly for communication. He is due for colonoscopy this year. Received flu vaccine today. He has declined COVID-19 booster today.    James Richards has a history of tongue and larynx cancer, was followed by ENT. Underwent chemo/RT and right neck lymph node dissection in 2009, reoccurrence in 2016, underwent partial glossectomy.  He denies dysphagia or sore throat. He has mildly impaired speech. Hypertension, follow-up: Pt has hypertensive heart disease with heart failure. He is followed by cardiologist.  He was last seen for hypertension 4 months ago.  BP at that visit was 130/80. Management includes Losartan 50 mg, Furosemide 40 mg e/o day, ASA 81 mg. He reports excellent compliance with treatment. He is not having side effects.  He is following a Regular diet. He is not exercising. He does not smoke. Use of agents associated with hypertension: thyroid hormones.   Lipid/Cholesterol, Follow-up  Last lipid panel Other pertinent labs  Lab Results  Component Value Date   CHOL 113 08/24/2021   HDL 39 (L) 08/24/2021   LDLCALC 57 08/24/2021   TRIG 89 08/24/2021   CHOLHDL 2.9 08/24/2021   Lab Results  Component Value Date   ALT 22 08/24/2021   AST 25 08/24/2021   PLT 230  08/24/2021   TSH 0.774 08/24/2021      Management includes lipitor 80 mg daily. He reports excellent compliance with treatment. He is not having side effects.   Hypothyroidism Pt has a history of hypothyroidism for several years. Current treatment includes Levothyroxine 125 mcg QD. Last TSH 0.774 on 08/25/22. He denies any symptoms of hypothyroidism currently. He is adherent to medication regimen and follow-up appts.    Pertinent labs: Lab Results  Component Value Date   CHOL 113 08/24/2021   HDL 39 (L) 08/24/2021   LDLCALC 57 08/24/2021   TRIG 89 08/24/2021   CHOLHDL 2.9 08/24/2021   Lab Results  Component Value Date   NA 141 08/24/2021   K 5.1 08/24/2021   CREATININE 1.04 08/24/2021   EGFR 75 08/24/2021   GFRNONAA >60 03/30/2021   GLUCOSE 92 08/24/2021     GERD, Follow up:  The patient was last seen for GERD 4 months ago. Current treatment consist ZM:CEYEMVVKPQ 40 mg, avoids foods that trigger GERD          He reports excellent compliance with treatment. He is not having side effects. He is NOT experiencing bilious reflux, dysphagia, or heartburn    Current Outpatient Medications on File Prior to Visit  Medication Sig Dispense Refill   aspirin EC 81 MG tablet Take 81 mg by mouth daily.     atorvastatin (LIPITOR) 80 MG tablet TAKE 1 TABLET EVERY DAY 90 tablet 2   clopidogrel (PLAVIX) 75 MG tablet Take 1 tablet (75 mg total)  by mouth daily. 90 tablet 2   furosemide (LASIX) 40 MG tablet Take 1 tablet (40 mg total) by mouth every other day. 90 tablet 2   levothyroxine (SYNTHROID) 125 MCG tablet TAKE 1 TABLET EVERY DAY 90 tablet 1   losartan (COZAAR) 50 MG tablet TAKE 1 TABLET EVERY DAY (Patient taking differently: Take 50 mg by mouth daily.) 90 tablet 3   minocycline (MINOCIN) 50 MG capsule Take 1 capsule (50 mg total) by mouth daily. 90 capsule 2   omeprazole (PRILOSEC) 40 MG capsule Take 1 capsule (40 mg total) by mouth daily. 90 capsule 1   Potassium Bicarbonate 99  MG CAPS Take by mouth.     SYMBICORT 160-4.5 MCG/ACT inhaler Inhale into the lungs.     acetaminophen-codeine (TYLENOL #3) 300-30 MG tablet TAKE 1 TABLET EVERY 4 HOURS AS NEEDED FOR MODERATE PAIN 42 tablet 0   albuterol (VENTOLIN HFA) 108 (90 Base) MCG/ACT inhaler SMARTSIG:2 Puff(s) By Mouth Every 4-6 Hours PRN     Omega-3 Fatty Acids (FISH OIL) 1200 MG CPDR Take 1 capsule by mouth daily.     TRELEGY ELLIPTA 100-62.5-25 MCG/ACT AEPB Take 1 puff by mouth daily. (Patient not taking: Reported on 12/27/2021)     No current facility-administered medications on file prior to visit.   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   History of recurrent TIAs 12/11/2014   Impacted cerumen 11/26/2019   Malignant neoplasm of base of tongue ([place]) 01/21/2015   Malignant neoplasm of larynx, unspecified (HCC)    Migraine 04/02/2014   Mixed hyperlipidemia    Other fatigue    Other specified hypothyroidism    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    Restless legs syndrome    Restrictive lung disease 11/26/2019   TIA (transient ischemic attack) 11/24/2014   Vision exam with abnormal findings concern for left retinal artery occlusion 03/29/2021   Past Surgical History:  Procedure Laterality Date   LYMPHADENECTOMY     PERIPHERAL VASCULAR CATHETERIZATION N/A 11/24/2014   Procedure: Aortic Arch Angiography;  Surgeon: Angelia Mould, MD;  Location: [facility];  Service: Cardiovascular;  Laterality: N/A;   PERIPHERAL VASCULAR CATHETERIZATION N/A 11/24/2014   Procedure: Cerebral Angiography;  Surgeon: Angelia Mould, MD;  Location: [facility];  Service: Cardiovascular;  Laterality: N/A;   THROAT SURGERY     TONSILECTOMY/ADENOIDECTOMY WITH MYRINGOTOMY      Family History  Problem Relation Age of Onset   Other Father        amputation   Deep vein thrombosis Father    Heart disease Father        Atrial Fib.   Cancer Sister    Hypertension Brother    Social History   Socioeconomic History   Marital status: Divorced    Spouse name: Not on file   Number of children: Not on file   Years of education: Not on file   Highest education level: Not on file  Occupational History   Not on file  Tobacco Use   Smoking status: Former    Packs/day: 1.00    Years: 41.00    Total pack years: 41.00    Types:  Cigarettes    Quit date: 02/15/2007    Years since quitting: 14.8   Smokeless tobacco: Former    Types: Nurse, children's Use: Never used  Substance and Sexual Activity   Alcohol use: No    Alcohol/week: 0.0 standard drinks of alcohol   Drug use: No   Sexual activity: Not Currently    Partners: Female  Other Topics Concern   Not on file  Social History Narrative   Not on file   Social Determinants of Health   Financial Resource Strain: Low Risk  (08/26/2021)   Overall Financial Resource Strain (CARDIA)    Difficulty of Paying Living Expenses: Not hard at all  Food Insecurity: No Food Insecurity (08/26/2021)   Hunger Vital Sign    Worried About Running Out of Food in the Last Year: Never true    Ran Out of Food in the Last Year: Never true  Transportation Needs: No Transportation Needs (08/26/2021)   PRAPARE - Hydrologist (Medical): No    Lack of Transportation (Non-Medical): No  Physical Activity: Inactive (08/26/2021)   Exercise Vital Sign    Days of Exercise per Week: 0 days    Minutes of Exercise per Session: 0 min  Stress: No Stress Concern Present (08/26/2021)   [place]    Feeling of Stress : Not at all  Social Connections: Moderately Isolated (08/26/2021)   Social Connection and Isolation Panel [NHANES]    Frequency of Communication with Friends and Family: More than three times a week    Frequency of Social Gatherings with Friends and Family: More than three times a week    Attends Religious Services: More than 4 times per year    Active Member of [employer] or Organizations: No    Attends Archivist Meetings: Never    Marital Status: Divorced    Review of Systems  Constitutional:  Negative for chills, diaphoresis, fatigue and fever.  HENT:  Positive for hearing loss (bilateral). Negative for congestion, ear pain and sore throat.   Eyes:  Positive for visual disturbance (left eye).  Respiratory:  Positive for cough (chronic) and shortness of breath (chronic).   Cardiovascular:  Negative for chest pain and leg swelling.  Gastrointestinal:  Negative for abdominal pain, constipation, diarrhea, nausea and vomiting.  Genitourinary:  Negative for dysuria and urgency.  Musculoskeletal:  Negative for arthralgias and myalgias.  Neurological:  Negative for dizziness and headaches.  Psychiatric/Behavioral:  Negative for dysphoric mood.      Objective:  BP 136/80   Pulse 66   Temp (!) 97.1 F (36.2 C)   Ht _0  (1.651 m)   Wt 183 lb (83 kg)   SpO2 100%   BMI 30.45 kg/m       12/27/2021   11:05 AM 09/08/2021    1:53 PM 08/26/2021    2:11 PM  BP/Weight  Systolic BP  578 469  Diastolic BP  60 80  Wt. (Lbs) 183 179 181  BMI 30.45 kg/m2 29.79 kg/m2 30.12 kg/m2    Physical Exam Constitutional:      Appearance: Normal appearance.  Neck:     Vascular: No carotid bruit.  Cardiovascular:     Rate and Rhythm: Normal rate and regular rhythm.     Heart sounds: Normal heart sounds. No murmur heard. Pulmonary:     Effort: Pulmonary effort is normal.     Breath sounds:  Normal breath sounds.  Abdominal:     General: Bowel sounds  are normal.     Palpations: Abdomen is soft. There is no mass.     Tenderness: There is no abdominal tenderness.  Musculoskeletal:        General: No swelling.  Neurological:     Mental Status: He is alert and oriented to person, place, and time.  Psychiatric:        Mood and Affect: Mood normal.        Behavior: Behavior normal.         Lab Results  Component Value Date   WBC 5.6 08/24/2021   HGB 14.1 08/24/2021   HCT 43.3 08/24/2021   PLT 230 08/24/2021   GLUCOSE 92 08/24/2021   CHOL 113 08/24/2021   TRIG 89 08/24/2021   HDL 39 (L) 08/24/2021   LDLCALC 57 08/24/2021   ALT 22 08/24/2021   AST 25 08/24/2021   NA 141 08/24/2021   K 5.1 08/24/2021   CL 102 08/24/2021   CREATININE 1.04 08/24/2021   BUN 15 08/24/2021   CO2 25 08/24/2021   TSH 0.774 08/24/2021   INR 1.0 03/29/2021   HGBA1C 5.8 (H) 03/30/2021      Assessment & Plan:   1. Hypertensive heart disease with chronic diastolic congestive heart failure (HCC)-stable - CBC with Differential/Platelet - Comprehensive metabolic panel -continue follow-up with cardiology as scheduled -continue Lasix 40 mg QD   2. Mixed hyperlipidemia-not at goal - CBC with Differential/Platelet - Comprehensive metabolic panel - Lipid panel -continue Lipitor 80 mg QD  3. Idiopathic pulmonary fibrosis (HCC) - SYMBICORT 160-4.5 MCG/ACT inhaler; Inhale into the lungs. -continue Albuterol as prescribed -follow-up with pulmonologist as scheduled - CBC with Differential/Platelet - Comprehensive metabolic panel - Flu Vaccine QUAD High Dose(Fluad)  4. Chronic respiratory failure with hypoxia, on home oxygen therapy (HCC) - CBC with Differential/Platelet - Comprehensive metabolic panel  5. Atherosclerosis of native artery of extremity with intermittent claudication, unspecified extremity (HCC) - CBC with Differential/Platelet - Comprehensive metabolic panel - Lipid panel -continue Lipitor 80 mg QD -continue heart healthy  diet  6. Essential hypertension-well controlled - CBC with Differential/Platelet - Comprehensive metabolic panel -continue Losartan 50 mg QD  7. Encounter for screening for malignant neoplasm of prostate - PSA  8. History of hearing loss -follow up with audiologist as scheduled  9. Need for immunization against influenza - Flu Vaccine QUAD High Dose(Fluad)   10. History of long term anticoagulant use - CBC with Differential/Platelet - Comprehensive metabolic panel  11. History of tongue cancer - CBC with Differential/Platelet - Comprehensive metabolic panel    We will call you with lab results Continue medications Follow up in 49-month, fasting    Follow-up: 449-month fasting  An After Visit Summary was printed and given to the patient.  I, ShRip HarbourNP, have reviewed all documentation for this visit. The documentation on 12/27/21 for the exam, diagnosis, procedures, and orders are all accurate and complete.    Signed, ShRip HarbourNP [phone]

## 2021-12-28 LAB — CBC WITH DIFFERENTIAL/PLATELET
Basophils Absolute: 0.1 10*3/uL (ref 0.0–0.2)
Basos: 1 %
EOS (ABSOLUTE): 0.1 10*3/uL (ref 0.0–0.4)
Eos: 3 %
Hematocrit: 42.7 % (ref 37.5–51.0)
Hemoglobin: 13.7 g/dL (ref 13.0–17.7)
Immature Grans (Abs): 0 10*3/uL (ref 0.0–0.1)
Immature Granulocytes: 0 %
Lymphocytes Absolute: 0.9 10*3/uL (ref 0.7–3.1)
Lymphs: 18 %
MCH: 30.9 pg (ref 26.6–33.0)
MCHC: 32.1 g/dL (ref 31.5–35.7)
MCV: 96 fL (ref 79–97)
Monocytes Absolute: 0.5 10*3/uL (ref 0.1–0.9)
Monocytes: 10 %
Neutrophils Absolute: 3.6 10*3/uL (ref 1.4–7.0)
Neutrophils: 68 %
Platelets: 235 10*3/uL (ref 150–450)
RBC: 4.43 x10E6/uL (ref 4.14–5.80)
RDW: 13 % (ref 11.6–15.4)
WBC: 5.2 10*3/uL (ref 3.4–10.8)

## 2021-12-28 LAB — COMPREHENSIVE METABOLIC PANEL
ALT: 21 IU/L (ref 0–44)
AST: 24 IU/L (ref 0–40)
Albumin/Globulin Ratio: 1.8 (ref 1.2–2.2)
Albumin: 4.1 g/dL (ref 3.8–4.8)
Alkaline Phosphatase: 101 IU/L (ref 44–121)
BUN/Creatinine Ratio: 16 (ref 10–24)
BUN: 16 mg/dL (ref 8–27)
Bilirubin Total: 0.6 mg/dL (ref 0.0–1.2)
CO2: 24 mmol/L (ref 20–29)
Calcium: 9.5 mg/dL (ref 8.6–10.2)
Chloride: 103 mmol/L (ref 96–106)
Creatinine, Ser: 0.98 mg/dL (ref 0.76–1.27)
Globulin, Total: 2.3 g/dL (ref 1.5–4.5)
Glucose: 82 mg/dL (ref 70–99)
Potassium: 3.9 mmol/L (ref 3.5–5.2)
Sodium: 143 mmol/L (ref 134–144)
Total Protein: 6.4 g/dL (ref 6.0–8.5)
eGFR: 80 mL/min/{1.73_m2} (ref >59)

## 2021-12-28 LAB — CARDIOVASCULAR RISK ASSESSMENT

## 2021-12-28 LAB — LIPID PANEL
Chol/HDL Ratio: 2.3 ratio (ref 0.0–5.0)
Cholesterol, Total: 102 mg/dL (ref 100–199)
HDL: 45 mg/dL (ref >39)
LDL Chol Calc (NIH): 42 mg/dL (ref 0–99)
Triglycerides: 73 mg/dL (ref 0–149)
VLDL Cholesterol Cal: 15 mg/dL (ref 5–40)

## 2021-12-28 LAB — PSA: Prostate Specific Ag, Serum: 0.5 ng/mL (ref 0.0–4.0)

## 2021-12-30 DIAGNOSIS — R0600 Dyspnea, unspecified: Secondary | ICD-10-CM | POA: Diagnosis not present

## 2021-12-30 DIAGNOSIS — R918 Other nonspecific abnormal finding of lung field: Secondary | ICD-10-CM | POA: Diagnosis not present

## 2021-12-30 DIAGNOSIS — J84112 Idiopathic pulmonary fibrosis: Secondary | ICD-10-CM | POA: Diagnosis not present

## 2021-12-30 DIAGNOSIS — J4540 Moderate persistent asthma, uncomplicated: Secondary | ICD-10-CM | POA: Diagnosis not present

## 2021-12-30 DIAGNOSIS — G4736 Sleep related hypoventilation in conditions classified elsewhere: Secondary | ICD-10-CM | POA: Diagnosis not present

## 2022-01-11 DIAGNOSIS — G4736 Sleep related hypoventilation in conditions classified elsewhere: Secondary | ICD-10-CM | POA: Diagnosis not present

## 2022-01-11 DIAGNOSIS — R918 Other nonspecific abnormal finding of lung field: Secondary | ICD-10-CM | POA: Diagnosis not present

## 2022-01-11 DIAGNOSIS — J4540 Moderate persistent asthma, uncomplicated: Secondary | ICD-10-CM | POA: Diagnosis not present

## 2022-01-11 DIAGNOSIS — J84112 Idiopathic pulmonary fibrosis: Secondary | ICD-10-CM | POA: Diagnosis not present

## 2022-01-11 DIAGNOSIS — R0600 Dyspnea, unspecified: Secondary | ICD-10-CM | POA: Diagnosis not present

## 2022-02-16 ENCOUNTER — Other Ambulatory Visit: Payer: Self-pay | Admitting: Legal Medicine

## 2022-02-16 DIAGNOSIS — E782 Mixed hyperlipidemia: Secondary | ICD-10-CM

## 2022-03-03 ENCOUNTER — Other Ambulatory Visit: Payer: Self-pay

## 2022-03-03 MED ORDER — LEVOTHYROXINE SODIUM 125 MCG PO TABS: 125.0000 ug | ORAL_TABLET | Freq: Every day | ORAL | 1 refills | Status: DC

## 2022-03-03 MED ORDER — OMEPRAZOLE 40 MG PO CPDR: 40.0000 mg | DELAYED_RELEASE_CAPSULE | Freq: Every day | ORAL | 1 refills | Status: DC

## 2022-03-06 ENCOUNTER — Emergency Department (HOSPITAL_COMMUNITY): Payer: Medicare PPO

## 2022-03-06 ENCOUNTER — Inpatient Hospital Stay (HOSPITAL_COMMUNITY): Payer: Medicare PPO

## 2022-03-06 ENCOUNTER — Encounter (HOSPITAL_COMMUNITY)
Admission: EM | Disposition: A | Discharge status: Home Health | Payer: Self-pay | Source: Home / Self Care | Attending: Neurology

## 2022-03-06 ENCOUNTER — Inpatient Hospital Stay (HOSPITAL_COMMUNITY)
Admission: EM | Admit: 2022-03-06 | Discharge: 2022-03-08 | DRG: 065 | Disposition: A | Discharge status: Home Health | Payer: Medicare PPO | Attending: Neurology | Admitting: Neurology

## 2022-03-06 ENCOUNTER — Inpatient Hospital Stay (HOSPITAL_COMMUNITY): Payer: Medicare PPO | Admitting: Anesthesiology

## 2022-03-06 DIAGNOSIS — G2581 Restless legs syndrome: Secondary | ICD-10-CM | POA: Diagnosis present

## 2022-03-06 DIAGNOSIS — Z9221 Personal history of antineoplastic chemotherapy: Secondary | ICD-10-CM

## 2022-03-06 DIAGNOSIS — Z6830 Body mass index (BMI) 30.0-30.9, adult: Secondary | ICD-10-CM

## 2022-03-06 DIAGNOSIS — Z7951 Long term (current) use of inhaled steroids: Secondary | ICD-10-CM

## 2022-03-06 DIAGNOSIS — Z8673 Personal history of transient ischemic attack (TIA), and cerebral infarction without residual deficits: Secondary | ICD-10-CM

## 2022-03-06 DIAGNOSIS — Z85810 Personal history of malignant neoplasm of tongue: Secondary | ICD-10-CM

## 2022-03-06 DIAGNOSIS — Z923 Personal history of irradiation: Secondary | ICD-10-CM | POA: Diagnosis not present

## 2022-03-06 DIAGNOSIS — I63511 Cerebral infarction due to unspecified occlusion or stenosis of right middle cerebral artery: Secondary | ICD-10-CM | POA: Diagnosis present

## 2022-03-06 DIAGNOSIS — I5032 Chronic diastolic (congestive) heart failure: Secondary | ICD-10-CM | POA: Diagnosis present

## 2022-03-06 DIAGNOSIS — I2510 Atherosclerotic heart disease of native coronary artery without angina pectoris: Secondary | ICD-10-CM | POA: Diagnosis present

## 2022-03-06 DIAGNOSIS — I63232 Cerebral infarction due to unspecified occlusion or stenosis of left carotid arteries: Secondary | ICD-10-CM | POA: Diagnosis not present

## 2022-03-06 DIAGNOSIS — I6522 Occlusion and stenosis of left carotid artery: Secondary | ICD-10-CM | POA: Diagnosis not present

## 2022-03-06 DIAGNOSIS — Z8521 Personal history of malignant neoplasm of larynx: Secondary | ICD-10-CM | POA: Diagnosis not present

## 2022-03-06 DIAGNOSIS — I6602 Occlusion and stenosis of left middle cerebral artery: Secondary | ICD-10-CM | POA: Diagnosis not present

## 2022-03-06 DIAGNOSIS — I639 Cerebral infarction, unspecified: Secondary | ICD-10-CM | POA: Diagnosis present

## 2022-03-06 DIAGNOSIS — Z720 Tobacco use: Secondary | ICD-10-CM

## 2022-03-06 DIAGNOSIS — R29709 NIHSS score 9: Secondary | ICD-10-CM | POA: Diagnosis not present

## 2022-03-06 DIAGNOSIS — I160 Hypertensive urgency: Secondary | ICD-10-CM

## 2022-03-06 DIAGNOSIS — E669 Obesity, unspecified: Secondary | ICD-10-CM | POA: Diagnosis present

## 2022-03-06 DIAGNOSIS — E039 Hypothyroidism, unspecified: Secondary | ICD-10-CM | POA: Diagnosis present

## 2022-03-06 DIAGNOSIS — H53462 Homonymous bilateral field defects, left side: Secondary | ICD-10-CM | POA: Diagnosis present

## 2022-03-06 DIAGNOSIS — R4781 Slurred speech: Secondary | ICD-10-CM | POA: Diagnosis present

## 2022-03-06 DIAGNOSIS — R531 Weakness: Secondary | ICD-10-CM

## 2022-03-06 DIAGNOSIS — Z1152 Encounter for screening for COVID-19: Secondary | ICD-10-CM

## 2022-03-06 DIAGNOSIS — Z8249 Family history of ischemic heart disease and other diseases of the circulatory system: Secondary | ICD-10-CM | POA: Diagnosis not present

## 2022-03-06 DIAGNOSIS — H908 Mixed conductive and sensorineural hearing loss, unspecified: Secondary | ICD-10-CM | POA: Diagnosis present

## 2022-03-06 DIAGNOSIS — R29716 NIHSS score 16: Secondary | ICD-10-CM | POA: Diagnosis present

## 2022-03-06 DIAGNOSIS — I110 Hypertensive heart disease with heart failure: Secondary | ICD-10-CM | POA: Diagnosis present

## 2022-03-06 DIAGNOSIS — Z7982 Long term (current) use of aspirin: Secondary | ICD-10-CM

## 2022-03-06 DIAGNOSIS — E782 Mixed hyperlipidemia: Secondary | ICD-10-CM | POA: Diagnosis present

## 2022-03-06 DIAGNOSIS — J84112 Idiopathic pulmonary fibrosis: Secondary | ICD-10-CM | POA: Diagnosis present

## 2022-03-06 DIAGNOSIS — H518 Other specified disorders of binocular movement: Secondary | ICD-10-CM | POA: Diagnosis present

## 2022-03-06 DIAGNOSIS — Z79899 Other long term (current) drug therapy: Secondary | ICD-10-CM

## 2022-03-06 DIAGNOSIS — I10 Essential (primary) hypertension: Secondary | ICD-10-CM

## 2022-03-06 DIAGNOSIS — Z79890 Hormone replacement therapy (postmenopausal): Secondary | ICD-10-CM

## 2022-03-06 DIAGNOSIS — G8194 Hemiplegia, unspecified affecting left nondominant side: Secondary | ICD-10-CM | POA: Diagnosis present

## 2022-03-06 DIAGNOSIS — Z7902 Long term (current) use of antithrombotics/antiplatelets: Secondary | ICD-10-CM

## 2022-03-06 DIAGNOSIS — Z87891 Personal history of nicotine dependence: Secondary | ICD-10-CM

## 2022-03-06 DIAGNOSIS — I6521 Occlusion and stenosis of right carotid artery: Secondary | ICD-10-CM | POA: Diagnosis present

## 2022-03-06 DIAGNOSIS — I739 Peripheral vascular disease, unspecified: Secondary | ICD-10-CM | POA: Diagnosis present

## 2022-03-06 DIAGNOSIS — E785 Hyperlipidemia, unspecified: Secondary | ICD-10-CM | POA: Diagnosis present

## 2022-03-06 DIAGNOSIS — R29810 Facial weakness: Secondary | ICD-10-CM | POA: Diagnosis present

## 2022-03-06 DIAGNOSIS — G43909 Migraine, unspecified, not intractable, without status migrainosus: Secondary | ICD-10-CM | POA: Diagnosis present

## 2022-03-06 DIAGNOSIS — R471 Dysarthria and anarthria: Secondary | ICD-10-CM | POA: Diagnosis present

## 2022-03-06 HISTORY — PX: IR PERCUTANEOUS ART THROMBECTOMY/INFUSION INTRACRANIAL INC DIAG ANGIO: IMG6087

## 2022-03-06 HISTORY — PX: RADIOLOGY WITH ANESTHESIA: SHX6223

## 2022-03-06 HISTORY — PX: IR CT HEAD LTD: IMG2386

## 2022-03-06 LAB — COMPREHENSIVE METABOLIC PANEL
ALT: 27 U/L (ref 0–44)
AST: 27 U/L (ref 15–41)
Albumin: 3 g/dL — ABNORMAL LOW (ref 3.5–5.0)
Alkaline Phosphatase: 84 U/L (ref 38–126)
Anion gap: 11 (ref 5–15)
BUN: 25 mg/dL — ABNORMAL HIGH (ref 8–23)
CO2: 23 mmol/L (ref 22–32)
Calcium: 8.8 mg/dL — ABNORMAL LOW (ref 8.9–10.3)
Chloride: 103 mmol/L (ref 98–111)
Creatinine, Ser: 1.05 mg/dL (ref 0.61–1.24)
GFR, Estimated: >60 mL/min (ref >60)
Glucose, Bld: 106 mg/dL — ABNORMAL HIGH (ref 70–99)
Potassium: 3.9 mmol/L (ref 3.5–5.1)
Sodium: 137 mmol/L (ref 135–145)
Total Bilirubin: 0.7 mg/dL (ref 0.3–1.2)
Total Protein: 5.8 g/dL — ABNORMAL LOW (ref 6.5–8.1)

## 2022-03-06 LAB — PROTIME-INR
INR: 1.2 (ref 0.8–1.2)
Prothrombin Time: 14.7 seconds (ref 11.4–15.2)

## 2022-03-06 LAB — RESP PANEL BY RT-PCR (RSV, FLU A&B, COVID)  RVPGX2
Influenza A by PCR: NEGATIVE
Influenza B by PCR: NEGATIVE
Resp Syncytial Virus by PCR: NEGATIVE
SARS Coronavirus 2 by RT PCR: NEGATIVE

## 2022-03-06 LAB — I-STAT CHEM 8, ED
BUN: 28 mg/dL — ABNORMAL HIGH (ref 8–23)
Calcium, Ion: 1 mmol/L — ABNORMAL LOW (ref 1.15–1.40)
Chloride: 104 mmol/L (ref 98–111)
Creatinine, Ser: 1 mg/dL (ref 0.61–1.24)
Glucose, Bld: 107 mg/dL — ABNORMAL HIGH (ref 70–99)
HCT: 41 % (ref 39.0–52.0)
Hemoglobin: 13.9 g/dL (ref 13.0–17.0)
Potassium: 4 mmol/L (ref 3.5–5.1)
Sodium: 139 mmol/L (ref 135–145)
TCO2: 24 mmol/L (ref 22–32)

## 2022-03-06 LAB — CBC
HCT: 41.5 % (ref 39.0–52.0)
Hemoglobin: 14.1 g/dL (ref 13.0–17.0)
MCH: 31 pg (ref 26.0–34.0)
MCHC: 34 g/dL (ref 30.0–36.0)
MCV: 91.2 fL (ref 80.0–100.0)
Platelets: 215 10*3/uL (ref 150–400)
RBC: 4.55 MIL/uL (ref 4.22–5.81)
RDW: 14.1 % (ref 11.5–15.5)
WBC: 7.4 10*3/uL (ref 4.0–10.5)
nRBC: 0 % (ref 0.0–0.2)

## 2022-03-06 LAB — DIFFERENTIAL
Abs Immature Granulocytes: 0.07 10*3/uL (ref 0.00–0.07)
Basophils Absolute: 0.1 10*3/uL (ref 0.0–0.1)
Basophils Relative: 1 %
Eosinophils Absolute: 0.3 10*3/uL (ref 0.0–0.5)
Eosinophils Relative: 4 %
Immature Granulocytes: 1 %
Lymphocytes Relative: 15 %
Lymphs Abs: 1.1 10*3/uL (ref 0.7–4.0)
Monocytes Absolute: 0.9 10*3/uL (ref 0.1–1.0)
Monocytes Relative: 12 %
Neutro Abs: 5 10*3/uL (ref 1.7–7.7)
Neutrophils Relative %: 67 %

## 2022-03-06 LAB — RAPID URINE DRUG SCREEN, HOSP PERFORMED
Amphetamines: NOT DETECTED
Barbiturates: NOT DETECTED
Benzodiazepines: NOT DETECTED
Cocaine: NOT DETECTED
Opiates: NOT DETECTED
Tetrahydrocannabinol: NOT DETECTED

## 2022-03-06 LAB — HEMOGLOBIN A1C
Hgb A1c MFr Bld: 5.7 % — ABNORMAL HIGH (ref 4.8–5.6)
Mean Plasma Glucose: 116.89 mg/dL

## 2022-03-06 LAB — URINALYSIS, COMPLETE (UACMP) WITH MICROSCOPIC
Bacteria, UA: NONE SEEN
Bilirubin Urine: NEGATIVE
Glucose, UA: NEGATIVE mg/dL
Hgb urine dipstick: NEGATIVE
Ketones, ur: NEGATIVE mg/dL
Nitrite: NEGATIVE
Protein, ur: NEGATIVE mg/dL
Specific Gravity, Urine: >1.046 — ABNORMAL HIGH (ref 1.005–1.030)
pH: 5 (ref 5.0–8.0)

## 2022-03-06 LAB — APTT: aPTT: 25 seconds (ref 24–36)

## 2022-03-06 LAB — GLUCOSE, CAPILLARY: Glucose-Capillary: 93 mg/dL (ref 70–99)

## 2022-03-06 LAB — CBG MONITORING, ED: Glucose-Capillary: 105 mg/dL — ABNORMAL HIGH (ref 70–99)

## 2022-03-06 LAB — MRSA NEXT GEN BY PCR, NASAL: MRSA by PCR Next Gen: NOT DETECTED

## 2022-03-06 LAB — ETHANOL: Alcohol, Ethyl (B): <10 mg/dL (ref <10)

## 2022-03-06 SURGERY — RADIOLOGY WITH ANESTHESIA
Anesthesia: General

## 2022-03-06 MED ORDER — SENNOSIDES-DOCUSATE SODIUM 8.6-50 MG PO TABS: 1.0000 | ORAL_TABLET | Freq: Every evening | ORAL | Status: DC | PRN

## 2022-03-06 MED ORDER — PROPOFOL 10 MG/ML IV BOLUS
INTRAVENOUS | Status: DC | PRN
  Administered 2022-03-06: 120 mg via INTRAVENOUS

## 2022-03-06 MED ORDER — ACETAMINOPHEN 160 MG/5ML PO SOLN: 650.0000 mg | ORAL | Status: DC | PRN

## 2022-03-06 MED ORDER — ASPIRIN 81 MG PO CHEW
81.0000 mg | CHEWABLE_TABLET | Freq: Every day | ORAL | Status: DC
  Administered 2022-03-07 – 2022-03-08 (×2): 81 mg via ORAL
  Filled 2022-03-06 (×2): qty 1

## 2022-03-06 MED ORDER — STROKE: EARLY STAGES OF RECOVERY BOOK
Freq: Once | Status: AC
  Filled 2022-03-06: qty 1

## 2022-03-06 MED ORDER — ROCURONIUM BROMIDE 10 MG/ML (PF) SYRINGE
PREFILLED_SYRINGE | INTRAVENOUS | Status: DC | PRN
  Administered 2022-03-06: 50 mg via INTRAVENOUS

## 2022-03-06 MED ORDER — ACETAMINOPHEN 650 MG RE SUPP: 650.0000 mg | RECTAL | Status: DC | PRN

## 2022-03-06 MED ORDER — NITROGLYCERIN 1 MG/10 ML FOR IR/CATH LAB
INTRA_ARTERIAL | Status: AC
  Filled 2022-03-06: qty 10

## 2022-03-06 MED ORDER — IOHEXOL 350 MG/ML SOLN
75.0000 mL | Freq: Once | INTRAVENOUS | Status: AC | PRN
  Administered 2022-03-06: 75 mL via INTRAVENOUS

## 2022-03-06 MED ORDER — METHOCARBAMOL 1000 MG/10ML IJ SOLN
500.0000 mg | Freq: Once | INTRAVENOUS | Status: AC
  Administered 2022-03-06: 500 mg via INTRAVENOUS
  Filled 2022-03-06: qty 5
  Filled 2022-03-06: qty 500

## 2022-03-06 MED ORDER — IOHEXOL 350 MG/ML SOLN
40.0000 mL | Freq: Once | INTRAVENOUS | Status: AC | PRN
  Administered 2022-03-06: 40 mL via INTRAVENOUS

## 2022-03-06 MED ORDER — LACTATED RINGERS IV SOLN: INTRAVENOUS | Status: DC | PRN

## 2022-03-06 MED ORDER — ATORVASTATIN CALCIUM 40 MG PO TABS: 80.0000 mg | ORAL_TABLET | Freq: Every day | ORAL | Status: DC

## 2022-03-06 MED ORDER — PHENYLEPHRINE 80 MCG/ML (10ML) SYRINGE FOR IV PUSH (FOR BLOOD PRESSURE SUPPORT)
PREFILLED_SYRINGE | INTRAVENOUS | Status: DC | PRN
  Administered 2022-03-06: 80 ug via INTRAVENOUS

## 2022-03-06 MED ORDER — FENTANYL CITRATE (PF) 250 MCG/5ML IJ SOLN
INTRAMUSCULAR | Status: DC | PRN
  Administered 2022-03-06: 50 ug via INTRAVENOUS

## 2022-03-06 MED ORDER — ACETAMINOPHEN 325 MG PO TABS: 650.0000 mg | ORAL_TABLET | ORAL | Status: DC | PRN

## 2022-03-06 MED ORDER — SUCCINYLCHOLINE CHLORIDE 200 MG/10ML IV SOSY
PREFILLED_SYRINGE | INTRAVENOUS | Status: DC | PRN
  Administered 2022-03-06: 120 mg via INTRAVENOUS

## 2022-03-06 MED ORDER — SODIUM CHLORIDE 0.9% FLUSH
3.0000 mL | Freq: Once | INTRAVENOUS | Status: AC
  Administered 2022-03-06: 3 mL via INTRAVENOUS

## 2022-03-06 MED ORDER — PHENYLEPHRINE HCL-NACL 20-0.9 MG/250ML-% IV SOLN
INTRAVENOUS | Status: DC | PRN
  Administered 2022-03-06: 25 ug/min via INTRAVENOUS

## 2022-03-06 MED ORDER — CLEVIDIPINE BUTYRATE 0.5 MG/ML IV EMUL
0.0000 mg/h | INTRAVENOUS | Status: DC
  Administered 2022-03-07: 4 mg/h via INTRAVENOUS
  Filled 2022-03-06 (×2): qty 50

## 2022-03-06 MED ORDER — ENOXAPARIN SODIUM 40 MG/0.4ML IJ SOSY: 40.0000 mg | PREFILLED_SYRINGE | INTRAMUSCULAR | Status: DC

## 2022-03-06 MED ORDER — ONDANSETRON HCL 4 MG/2ML IJ SOLN
INTRAMUSCULAR | Status: DC | PRN
  Administered 2022-03-06: 4 mg via INTRAVENOUS

## 2022-03-06 MED ORDER — IOHEXOL 300 MG/ML  SOLN
150.0000 mL | Freq: Once | INTRAMUSCULAR | Status: AC | PRN
  Administered 2022-03-06: 20 mL via INTRA_ARTERIAL

## 2022-03-06 MED ORDER — FENTANYL CITRATE (PF) 100 MCG/2ML IJ SOLN
INTRAMUSCULAR | Status: AC
  Filled 2022-03-06: qty 2

## 2022-03-06 MED ORDER — CLOPIDOGREL BISULFATE 75 MG PO TABS
75.0000 mg | ORAL_TABLET | Freq: Every day | ORAL | Status: DC
  Administered 2022-03-07 – 2022-03-08 (×2): 75 mg via ORAL
  Filled 2022-03-06 (×2): qty 1

## 2022-03-06 MED ORDER — ASPIRIN 81 MG PO CHEW: 81.0000 mg | CHEWABLE_TABLET | Freq: Every day | ORAL | Status: DC

## 2022-03-06 MED ORDER — ASPIRIN 300 MG RE SUPP: 300.0000 mg | Freq: Every day | RECTAL | Status: DC

## 2022-03-06 MED ORDER — CEFAZOLIN SODIUM-DEXTROSE 2-3 GM-%(50ML) IV SOLR
INTRAVENOUS | Status: DC | PRN
  Administered 2022-03-06: 2 g via INTRAVENOUS

## 2022-03-06 MED ORDER — SODIUM CHLORIDE 0.9 % IV SOLN: INTRAVENOUS | Status: DC

## 2022-03-06 MED ORDER — LEVOTHYROXINE SODIUM 25 MCG PO TABS
125.0000 ug | ORAL_TABLET | Freq: Every day | ORAL | Status: DC
  Administered 2022-03-08: 125 ug via ORAL
  Filled 2022-03-06: qty 1

## 2022-03-06 MED ORDER — CLOPIDOGREL BISULFATE 75 MG PO TABS: 75.0000 mg | ORAL_TABLET | Freq: Every day | ORAL | Status: DC

## 2022-03-06 MED ORDER — CHLORHEXIDINE GLUCONATE CLOTH 2 % EX PADS
6.0000 | MEDICATED_PAD | Freq: Every day | CUTANEOUS | Status: DC
  Administered 2022-03-07 – 2022-03-08 (×2): 6 via TOPICAL

## 2022-03-06 MED ORDER — SUGAMMADEX SODIUM 200 MG/2ML IV SOLN
INTRAVENOUS | Status: DC | PRN
  Administered 2022-03-06 (×2): 100 mg via INTRAVENOUS

## 2022-03-06 MED ORDER — ORAL CARE MOUTH RINSE: 15.0000 mL | OROMUCOSAL | Status: DC | PRN

## 2022-03-06 MED ORDER — CLEVIDIPINE BUTYRATE 0.5 MG/ML IV EMUL
INTRAVENOUS | Status: AC
  Administered 2022-03-06: 2 mg/h via INTRAVENOUS
  Filled 2022-03-06: qty 100

## 2022-03-06 MED ORDER — ATORVASTATIN CALCIUM 80 MG PO TABS
80.0000 mg | ORAL_TABLET | Freq: Every day | ORAL | Status: DC
  Administered 2022-03-07: 80 mg via ORAL
  Filled 2022-03-06: qty 2

## 2022-03-06 MED ORDER — CEFAZOLIN SODIUM-DEXTROSE 2-4 GM/100ML-% IV SOLN
INTRAVENOUS | Status: AC
  Filled 2022-03-06: qty 100

## 2022-03-06 MED ORDER — LIDOCAINE 2% (20 MG/ML) 5 ML SYRINGE
INTRAMUSCULAR | Status: DC | PRN
  Administered 2022-03-06: 60 mg via INTRAVENOUS

## 2022-03-06 NOTE — Anesthesia Postprocedure Evaluation (Signed)
Anesthesia Post Note  Patient: James Richards  Procedure(s) Performed: RADIOLOGY WITH ANESTHESIA     Patient location during evaluation: PACU Anesthesia Type: General Level of consciousness: awake Pain management: pain level controlled Vital Signs Assessment: post-procedure vital signs reviewed and stable Respiratory status: spontaneous breathing, nonlabored ventilation, respiratory function stable and patient connected to nasal cannula oxygen Cardiovascular status: blood pressure returned to baseline and stable Postop Assessment: no apparent nausea or vomiting Anesthetic complications: no  No notable events documented.  Last Vitals:  Vitals:   03/06/22 1609 03/06/22 1615  BP:    Pulse:  70  Resp:  14  Temp: (!) 36.3 C   SpO2:  100%    Last Pain:  Vitals:   03/06/22 1609  TempSrc: Oral                 Audry Pili

## 2022-03-06 NOTE — Code Documentation (Addendum)
Stroke Response Nurse Documentation Code Documentation  James Richards is a 77 y.o. male arriving to [facility]  via [facility] on 03/06/22 with past medical hx of TIA, HTN, HLD, Migraine. On aspirin 81 mg daily and clopidogrel 75 mg daily. Code stroke was activated by EMS.   Patient from home where he lives with his cousin. He was in his normal state with symptom onset at 1300 per patient.   Stroke team at the bedside on patient arrival. Labs drawn and patient cleared for CT by Dr. Gilford Raid. Patient to CT with team. NIHSS 16, see documentation for details and code stroke times. Patient with right gaze preference , left hemianopia, left facial droop, left arm weakness, left leg weakness, left decreased sensation, dysarthria , and left neglect on exam.   The following imaging was completed:  CT Head, CTA, and CTP. Patient is not a candidate for IV Thrombolytic due to unable to rule out blood thinner, unreliable/unable to consent. Patient is a candidate for IR due to LVO positive.   Care Plan: patient taken to IR, follow post IR protocol.   Bedside handoff with IR RN.    Candace Cruise K  Stroke Response RN  Verified phone numbers once family arrived: cousin (POA) Mariana Single [phone] Adopted Daughter Kenn File [phone]

## 2022-03-06 NOTE — ED Triage Notes (Signed)
Pt bib ems from home c/o CVA. Pt was sitting in chair holding laptop LKW [id number] when laptop fell to floor. Pt notices weakness. Pt cousin witness LKW 0130.  BP 138/80 HR 96 RA 96% CBG 102

## 2022-03-06 NOTE — Transfer of Care (Signed)
Immediate Anesthesia Transfer of Care Note  Patient: SENON NIXON  Procedure(s) Performed: RADIOLOGY WITH ANESTHESIA  Patient Location: ICU  Anesthesia Type:General  Level of Consciousness: awake, alert , and oriented  Airway & Oxygen Therapy: Patient Spontanous Breathing and Patient connected to nasal cannula oxygen  Post-op Assessment: Report given to RN and Post -op Vital signs reviewed and stable  Post vital signs: Reviewed and stable  Last Vitals:  Vitals Value Taken Time  BP 141/85 1800  Temp    Pulse 64 03/06/22 1806  Resp 19 03/06/22 1806  SpO2 100 % 03/06/22 1806  Vitals shown include unvalidated device data.  Last Pain:  Vitals:   03/06/22 1609  TempSrc: Oral         Complications: No notable events documented.

## 2022-03-06 NOTE — ED Notes (Signed)
Pt to IR

## 2022-03-06 NOTE — Anesthesia Preprocedure Evaluation (Addendum)
Anesthesia Evaluation    Reviewed: Allergy & Precautions, Patient's Chart, lab work & pertinent test results, Unable to perform ROS - Chart review onlyPreop documentation limited or incomplete due to emergent nature of procedure.  History of Anesthesia Complications Negative for: history of anesthetic complications  Airway Mallampati: Unable to assess  TM Distance: >3 FB Neck ROM: Full    Dental  (+) Upper Dentures, Lower Dentures   Pulmonary former smoker  Pulmonary fibrosis, nocturnal O2    Pulmonary exam normal        Cardiovascular hypertension, Pt. on medications + Peripheral Vascular Disease  Normal cardiovascular exam   '23 TTE - EF 60 to 65%. Trivial mitral valve regurgitation.     Neuro/Psych  Headaches  RLS  TIACVA, Residual Symptoms    GI/Hepatic   Endo/Other  Hypothyroidism   Obesity   Renal/GU      Musculoskeletal   Abdominal   Peds  Hematology  On plavix, coumadin    Anesthesia Other Findings Hx laryngeal cancer   Reproductive/Obstetrics                             Anesthesia Physical Anesthesia Plan  ASA: 4 and emergent  Anesthesia Plan: General   Post-op Pain Management: Minimal or no pain anticipated   Induction: Intravenous and Rapid sequence  PONV Risk Score and Plan: 2 and Treatment may vary due to age or medical condition and Ondansetron  Airway Management Planned: Oral ETT  Additional Equipment: Arterial line  Intra-op Plan:   Post-operative Plan: Possible Post-op intubation/ventilation  Informed Consent:      History available from chart only  Plan Discussed with: CRNA and Anesthesiologist  Anesthesia Plan Comments:        Anesthesia Quick Evaluation

## 2022-03-06 NOTE — Anesthesia Procedure Notes (Signed)
Procedure Name: Intubation Date/Time: 03/06/2022 4:40 PM  Performed by: Trinna Post., CRNAPre-anesthesia Checklist: Patient identified, Emergency Drugs available, Suction available, Patient being monitored and Timeout performed Patient Re-evaluated:Patient Re-evaluated prior to induction Oxygen Delivery Method: Circle system utilized Preoxygenation: Pre-oxygenation with 100% oxygen Induction Type: IV induction, Rapid sequence and Cricoid Pressure applied Laryngoscope Size: Glidescope and 4 Grade View: Grade I Tube type: Oral Tube size: 7.5 mm Number of attempts: 1 Airway Equipment and Method: Rigid stylet and Video-laryngoscopy Placement Confirmation: ETT inserted through vocal cords under direct vision, positive ETCO2 and breath sounds checked- equal and bilateral Secured at: 21 cm Tube secured with: Tape Dental Injury: Teeth and Oropharynx as per pre-operative assessment

## 2022-03-06 NOTE — Anesthesia Procedure Notes (Signed)
Arterial Line Insertion Start/End1/21/2024 4:41 PM, 03/06/2022 4:45 PM Performed by: Audry Pili, MD, anesthesiologist  Patient location: OOR procedure area. Preanesthetic checklist: patient identified, IV checked, monitors and equipment checked and pre-op evaluation Emergency situation Patient sedated Left, radial was placed Catheter size: 20 G Hand hygiene performed   Attempts: 2 (First attempt failed on left radial due to patient movement during catheter advancement) Procedure performed without using ultrasound guided technique. Following insertion, dressing applied and Biopatch. Post procedure assessment: unchanged and normal  Patient tolerated the procedure well with no immediate complications.

## 2022-03-06 NOTE — H&P (Signed)
Neurology H&P  Reason for Consult: Code Stroke Referring Physician: Dr. Gilford Raid  CC: Left sided weakness, slurred speech  History is obtained from: Patient, EMS, Chart review, Patient's POA and cousin [name]- first cousin and POA [phone] Kenn File 2nd cousin [phone]  HPI: James Richards is a 77 y.o. 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 Patient lives with his cousin and POA, [name] who last saw patient in his usual state of health at 01:30 this morning. [name] went to church this morning and returned home at 13:30 finding [name] with left-sided weakness and slurred speech. Patient consistently reports the symptoms acutely onset at 13:00 and notified [name] that he was having a stroke prompting emergency service activation. On arrival patient was found to have a right gaze preference, left arm weakness > left leg weakness, slurred speech, and left homonymous hemianopia and patient was taken for CT imaging. CBG on arrival 105, blood pressure 138/80.   LKW: 13:00 with onset of acute symptoms per patient TNK given?: no, patient adamant that he is on blood thinners and unable to identify what medications he takes at home. Patient mental status appears to fluctuate in his understanding and at times, it is unclear if patient is able to understand clearly and to make his own decisions. Patient's POA  also states that the patient is on blood thinners, aspirin, and fish oil but unable to clarify all of his home medications. MAR indicates the patient is on ASA and clopidogrel though POA does not recognize clopidogrel from Mr. Devaul home medications. MAR indicates patient was last on Eliquis in 2016 for unknown reasons and was subsequently discontinued also in 2016.  IR Thrombectomy? Yes, patient with NIHSS of 16 on arrival. CTA with occluded right M2 MCA likely due to calcified embolus. CTP with 0 core and 23 mL penumbra.  IR activated at 16:05. Delay in activation related to (as above) unclear if patient was able to fully understand risk, benefits, and alternatives of procedure to consent as he appeared confused at times during multiple attempts of consent at the bedside prior to communication with his POA. Attempted to reach patient POA and other emergency contacts listed in chart multiple times without success. Correct phone numbers for emergency contacts listed above. Consent obtained from patient's POA in person after she arrived to the ED.   Modified Rankin Scale: 0-Completely asymptomatic and back to baseline post- stroke  ROS: A complete ROS was performed and is negative except as noted in the HPI.   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   History of recurrent TIAs 12/11/2014   Impacted cerumen 11/26/2019   Malignant neoplasm of base of tongue ([place]) 01/21/2015  Malignant neoplasm of larynx, unspecified ([facility])    Migraine 04/02/2014   Mixed hyperlipidemia    Other fatigue    Other specified hypothyroidism    Other transient  cerebral ischemic attacks and related syndromes    Restless legs syndrome    Restrictive lung disease 11/26/2019   TIA (transient ischemic attack) 11/24/2014   Vision exam with abnormal findings concern for left retinal artery occlusion 03/29/2021   Past Surgical History:  Procedure Laterality Date   LYMPHADENECTOMY     PERIPHERAL VASCULAR CATHETERIZATION N/A 11/24/2014   Procedure: Aortic Arch Angiography;  Surgeon: Angelia Mould, MD;  Location: [facility];  Service: Cardiovascular;  Laterality: N/A;   PERIPHERAL VASCULAR CATHETERIZATION N/A 11/24/2014   Procedure: Cerebral Angiography;  Surgeon: Angelia Mould, MD;  Location: [facility];  Service: Cardiovascular;  Laterality: N/A;   THROAT SURGERY     TONSILECTOMY/ADENOIDECTOMY WITH MYRINGOTOMY     Family History  Problem Relation Age of Onset   Other Father        amputation   Deep vein thrombosis Father    Heart disease Father        Atrial Fib.   Cancer Sister    Hypertension Brother    Social History:   reports that he quit smoking about 15 years ago. His smoking use included cigarettes. He has a 41.00 pack-year smoking history. He has quit using smokeless tobacco.  His smokeless tobacco use included chew. He reports that he does not drink alcohol and does not use drugs.  Medications  Current Facility-Administered Medications:    [START ON 03/07/2022]  stroke: early stages of recovery book, , Does not apply, Once, Toberman, Stevi W, NP   0.9 %  sodium chloride infusion, , Intravenous, Continuous, Toberman, Stevi W, NP   acetaminophen (TYLENOL) tablet 650 mg, 650 mg, Oral, Q4H PRN **OR** acetaminophen (TYLENOL) 160 MG/5ML solution 650 mg, 650 mg, Per Tube, Q4H PRN **OR** acetaminophen (TYLENOL) suppository 650 mg, 650 mg, Rectal, Q4H PRN, Ebbie Latus, Stevi W, NP   senna-docusate (Senokot-S) tablet 1 tablet, 1 tablet, Oral, QHS PRN, Rikki Spearing, NP  Current Outpatient Medications:     acetaminophen-codeine (TYLENOL #3) 300-30 MG tablet, TAKE 1 TABLET EVERY 4 HOURS AS NEEDED FOR MODERATE PAIN, Disp: 42 tablet, Rfl: 0   albuterol (VENTOLIN HFA) 108 (90 Base) MCG/ACT inhaler, SMARTSIG:2 Puff(s) By Mouth Every 4-6 Hours PRN, Disp: , Rfl:    aspirin EC 81 MG tablet, Take 81 mg by mouth daily., Disp: , Rfl:    atorvastatin (LIPITOR) 80 MG tablet, TAKE 1 TABLET EVERY DAY, Disp: 90 tablet, Rfl: 2   clopidogrel (PLAVIX) 75 MG tablet, Take 1 tablet (75 mg total) by mouth daily., Disp: 90 tablet, Rfl: 2   furosemide (LASIX) 40 MG tablet, Take 1 tablet (40 mg total) by mouth every other day., Disp: 90 tablet, Rfl: 2   levothyroxine (SYNTHROID) 125 MCG tablet, Take 1 tablet (125 mcg total) by mouth daily., Disp: 90 tablet, Rfl: 1   losartan (COZAAR) 50 MG tablet, Take 1 tablet (50 mg total) by mouth daily., Disp: 90 tablet, Rfl: 3   minocycline (MINOCIN) 50 MG capsule, Take 1 capsule (50 mg total) by mouth daily., Disp: 90 capsule, Rfl: 2   Omega-3 Fatty Acids (FISH OIL) 1200 MG CPDR, Take 1 capsule by mouth daily., Disp: , Rfl:    omeprazole (PRILOSEC) 40 MG capsule, Take 1 capsule (40 mg total) by mouth daily., Disp: 90 capsule, Rfl: 1  Potassium Bicarbonate 99 MG CAPS, Take by mouth., Disp: , Rfl:    SYMBICORT 160-4.5 MCG/ACT inhaler, Inhale into the lungs., Disp: , Rfl:   Exam: Current vital signs: BP 129/74 (BP Location: Right Arm)   Pulse (!) 2   Temp (!) 97.3 F (36.3 C) (Oral)   Resp 18   Wt 83 kg   SpO2 96%   BMI 30.45 kg/m  Vital signs in last 24 hours: Temp:  [97.3 F (36.3 C)] 97.3 F (36.3 C) (01/21 1609) Pulse Rate:  [2] 2 (01/21 1500) Resp:  [18] 18 (01/21 1500) BP: (129)/(74) 129/74 (01/21 1500) SpO2:  [96 %] 96 % (01/21 1500) Weight:  [83 kg] 83 kg (01/21 1501)  GENERAL: Awake, alert, appears acutely ill  Psych: Affect appropriate for situation, patient is calm and cooperative with examination Head: Normocephalic and atraumatic, without obvious  abnormality EENT: Normal conjunctivae, dry mucous membranes, no OP obstruction, dentures in place LUNGS: Normal respiratory effort. Non-labored breathing on room air CV: Regular rate and rhythm on telemetry ABDOMEN: Soft, non-tender, non-distended Extremities: Warm, well perfused, without obvious deformity  NEURO:  Mental Status: Awake, alert, and oriented to self, age, and month.  During conversation at times, patient does appear to have trouble understanding provider and therefore, consent was obtained from [place].  He is able to provide minimal details regarding his history of presentation.  Speech/Language: speech is dysarthric (increased from baseline 2/2 partial glossectomy)   No aphasia is noted. Left neglect is present.  Cranial Nerves:  II: PERRL.  III, IV, VI: Right gaze preference, will look fully left when prompted  V: Decreased sensation to light touch on the left face, does not blink to threat on the left.  VII: Left facial droop present VIII: Hearing intact to voice IX, X: Phonation intact.  XI: Head is grossly midline XII: Tongue protrudes midline  Motor: Right upper and lower extremities elevate antigravity without vertical drift. No movement of the left upper extremity. LLE will briefly elevate antigravity before falling to the bed.  Tone is normal. Bulk is normal.  Sensation: Absent to light touch on the left upper and lower extremities.  Coordination: FTN intact on the right, unable to assess on the left due to weakness.  Does not participate HKS.  Gait: Deferred for patient safety  NIHSS @ 15:00: 1a Level of Conscious.: 0 1b LOC Questions: 0 1c LOC Commands: 0 2 Best Gaze: 1 3 Visual: 2 4 Facial Palsy: 2 5a Motor Arm - left: 4 5b Motor Arm - Right: 0 6a Motor Leg - Left: 2 6b Motor Leg - Right: 0 7 Limb Ataxia: 0 8 Sensory: 2 9 Best Language: 0 10 Dysarthria: 1 11 Extinct. and Inatten.: 2 TOTAL: 16  NIHSS @ 16:20 while in IR prior to procedure 1a Level  of Conscious.: 0 1b LOC Questions: 0 1c LOC Commands: 0 2 Best Gaze: 0 3 Visual: 2 4 Facial Palsy: 1 5a Motor Arm - left: 1 5b Motor Arm - Right: 0 6a Motor Leg - Left: 0 6b Motor Leg - Right: 0 7 Limb Ataxia: 0 8 Sensory: 2 9 Best Language: 0 10 Dysarthria: 1 11 Extinct. and Inatten.: 2 TOTAL: 9  Labs I have reviewed labs in epic and the results pertinent to this consultation are: CBC    Component Value Date/Time   WBC 7.4 03/06/2022 1457   RBC 4.55 03/06/2022 1457   HGB 13.9 03/06/2022 1504   HGB 13.7 12/27/2021 1153   HCT 41.0  03/06/2022 1504   HCT 42.7 12/27/2021 1153   PLT 215 03/06/2022 1457   PLT 235 12/27/2021 1153   MCV 91.2 03/06/2022 1457   MCV 96 12/27/2021 1153   MCH 31.0 03/06/2022 1457   MCHC 34.0 03/06/2022 1457   RDW 14.1 03/06/2022 1457   RDW 13.0 12/27/2021 1153   LYMPHSABS 1.1 03/06/2022 1457   LYMPHSABS 0.9 12/27/2021 1153   MONOABS 0.9 03/06/2022 1457   EOSABS 0.3 03/06/2022 1457   EOSABS 0.1 12/27/2021 1153   BASOSABS 0.1 03/06/2022 1457   BASOSABS 0.1 12/27/2021 1153   CMP     Component Value Date/Time   NA 139 03/06/2022 1504   NA 143 12/27/2021 1153   K 4.0 03/06/2022 1504   CL 104 03/06/2022 1504   CO2 23 03/06/2022 1457   GLUCOSE 107 (H) 03/06/2022 1504   BUN 28 (H) 03/06/2022 1504   BUN 16 12/27/2021 1153   CREATININE 1.00 03/06/2022 1504   CALCIUM 8.8 (L) 03/06/2022 1457   PROT 5.8 (L) 03/06/2022 1457   PROT 6.4 12/27/2021 1153   ALBUMIN 3.0 (L) 03/06/2022 1457   ALBUMIN 4.1 12/27/2021 1153   AST 27 03/06/2022 1457   ALT 27 03/06/2022 1457   ALKPHOS 84 03/06/2022 1457   BILITOT 0.7 03/06/2022 1457   BILITOT 0.6 12/27/2021 1153   GFRNONAA >60 03/06/2022 1457   GFRAA 83 03/11/2020 1115   Lipid Panel     Component Value Date/Time   CHOL 102 12/27/2021 1153   TRIG 73 12/27/2021 1153   HDL 45 12/27/2021 1153   CHOLHDL 2.3 12/27/2021 1153   CHOLHDL 2.4 03/30/2021 0331   VLDL 11 03/30/2021 0331   LDLCALC 42  12/27/2021 1153   Lab Results  Component Value Date   HGBA1C 5.8 (H) 03/30/2021   Imaging I have reviewed the images obtained:  CT-scan of the brain 03/06/22: 1. No evidence of acute intracranial abnormality.  ASPECTS 10. 2. Similar remote infarcts, as above.  CT angio head and neck 03/06/22: 1. Occluded right mid M2 MCA (series 10, image 82) likely due to calcified embolus. 2. Chronically occluded right vertebral artery and left ICA.  CTP 03/06/22: 1. Approximally 23 mL of penumbra in the right MCA territory, correlating with occluded vessel seen on CTA. 2. No evidence of core infarct.  Assessment: 77 y.o. male with PMHx of essential HTN, carotid stenosis, recurrent TIAs, CVAs including CRAO in February of 2023, amaurosis fugax, chronic left carotid stenosis, chronic occlusion of right vertebral artery, tobacco use, malignant neoplasm of tongue and larynx s/p chemo and radiation and right neck lymph node dissection 2009 with recurrence in 2016 s/p partial glossectomy, HLD, PVD, chronic diastolic heart failure, idiopathic pulmonary fibrosis with chronic hypoxia, and RLS who presents to the ED with acute onset of left-sided weakness, left facial droop, and slurred speech with onset at 13:00 today.  -Examination reveals patient with left arm > left leg weakness, right gaze preference, absent sensation on the left, left homonymous hemianopia, dysarthria, and left-sided neglect.  Initial NIHSS of 16. -CT imaging revealed an occluded right M2 MCA with zero core and 23 mL of penumbra in the right MCA territory. -TNKase was not administered as patient and POA adamant that patient is on blood thinners at home but is unable to recall which medications he takes at home.  On chart review, patient is on aspirin and Plavix at home though unable to clarify these medications with patient or POA. -Patient is a candidate for IR.  IR discussed with patient though, at times, it is unclear if patient is able to  understand provider fully to comprehend the risks, alternatives, benefits of IR.  Initially, provider was unable to reach emergency contacts via telephone despite multiple attempts.  On return to ED room, patient's POA had recently arrived and the consent process was complete with patient's POA with patient in agreement.  -Stroke risk factors include remote tobacco use, patient's advanced age, history of CVA and TIA, known carotid stenosis, PVD.   Plan: Acute Ischemic Stroke Cerebral infarction due to embolism of right middle cerebral artery  Acuity: Acute Current Suspected Etiology: embolus Continue Evaluation:  -Admit to: ICU -Prophylaxis per IR -Continue statin  -Blood pressure control, goal 140-160 per IR -MRI/ECHO/A1C/Lipid panel. -Hyperglycemia management per SSI to maintain glucose 140-'180mg'$ /dL. -PT/OT/ST therapies and recommendations when able  CNS Acute ischemic stroke, RM2 MCA occlusion -Close neuro monitoring  Dysarthria -NPO until cleared by speech -ST -Advance diet as tolerated  Hemiplegia and hemiparesis following cerebral infarction affecting left non-dominant side  -PT/OT  RESP Mechanical ventilation for IR procedure Chronic hypoxia -vent management per ICU as needed following procedure -wean when able -Maintain SpO2 > 92% -Supplemental oxygen as needed  CV Essential (primary) hypertension -Aggressive BP control, goal per IR  Chronic diastolic heart failure -Cards Consult as needed  Hyperlipidemia, mixed - Statin for goal LDL < 70  HEME AM CBC -Monitor H&H -Transfuse for hgb < 7  ENDO Hgb A1c pending  -goal HgbA1c < 7%  GI/GU -Gentle hydration while NPO  Fluid/Electrolyte Disorders AM CMP -Replete as needed -Repeat labs -Trend  ID Trend WBC and fever curve  Prophylaxis DVT:  SCDs GI: PPI Bowel: Docusate / Senna  Diet: NPO until cleared by speech  Code Status: Full Code    THE FOLLOWING WERE PRESENT ON ADMISSION: CNS -   Acute Ischemic Stroke, Hemiplegia Cancer - History of tongue and larynx cancer s/p chemo, radiation, and partial glossectomy in 2009  [place], AGAC-NP [facility] Pager: [phone]  Neurology Attending Attestation   I examined the patient and discussed plan with Ms. Toberman. Above note has been edited by me to reflect my findings and recommendations. I was present throughout the stroke code and made all significant decisions and personally reviewed CNS imaging and also discussed CTA results with radiologist by phone.   Patient was taken to IR for mechanical thrombectomy. He was found to have a calcified embolus, larger proximally than distally, with no way to get around it. The risk of attempting thrombectomy in this situation was deemed to be high risk of vascular rupture. After discussion between myself and Dr. Estanislado Pandy the decision was made to abort the procedure. Patient will be admitted to 4N. Aspirin, plavix, and DVT prophylaxis scheduled to start tomorrow. BP goal 140-160 per IR.   This patient is critically ill and at significant risk of neurological worsening, death and care requires constant monitoring of vital signs, hemodynamics,respiratory and cardiac monitoring, neurological assessment, discussion with family, other specialists and medical decision making of high complexity. I spent 95 minutes of neurocritical care time  in the care of  this patient. This was time spent independent of any time provided by nurse practitioner or PA.   Su Monks, MD [facility] [phone]   If 7pm- 7am, please page neurology on call as listed in [place].

## 2022-03-06 NOTE — Procedures (Signed)
INR Status post right common carotid arteriogram.  Right CFA approach.  Findings.   1.  Occluded  M3 branch of the superior division of the right middle cerebral artery in the perisylvian triangle with modest  distal collateral reconstitution. 2.  Occluded left internal carotid artery proximally. CT of brain demonstrates no evidence of hemorrhagic complications.  Persistent calcific embolus seen in  the mid perisylvian branch of the superior division of the left MCA . 8 French Angio-Seal closure device deployed for hemostasis at the right groin puncture site.  Distal pedal pulses present unchanged, bilaterally.  Patient extubated.  Agitated.  Not following commands.  Moving right arm and leg, and left leg spontaneous.  No significant movement noted in the left upper extremity. Arlean Hopping MD

## 2022-03-06 NOTE — ED Provider Notes (Signed)
[facility] Provider Note   CSN: [id number] Arrival date & time: 03/06/22  1454     History  Chief Complaint  Patient presents with   Code Stroke    James Richards is a 77 y.o. male.  Pt is a 77 yo male with a pmhx significant for hld, hypothyroidism, htn, tia, laryngeal cancer, PVD, carotid stenosis, idiopathic pulmonary fibrosis requiring oxygen at night, and migraines.  Pt presents to the ED today as a code stroke.  Pt was LSN at 0130, but was able to indicate he had acute weakness in the left arm and leg around 1330.         Home Medications Prior to Admission medications   Medication Sig Start Date End Date Taking? Authorizing Provider  acetaminophen-codeine (TYLENOL #3) 300-30 MG tablet TAKE 1 TABLET EVERY 4 HOURS AS NEEDED FOR MODERATE PAIN 07/07/21   Rip Harbour, NP  albuterol (VENTOLIN HFA) 108 (90 Base) MCG/ACT inhaler SMARTSIG:2 Puff(s) By Mouth Every 4-6 Hours PRN 07/20/21   [provider]  aspirin EC 81 MG tablet Take 81 mg by mouth daily.    [provider]  atorvastatin (LIPITOR) 80 MG tablet TAKE 1 TABLET EVERY DAY 02/16/22   Cox, Kirsten, MD  clopidogrel (PLAVIX) 75 MG tablet Take 1 tablet (75 mg total) by mouth daily. 09/28/21   Lillard Anes, MD  furosemide (LASIX) 40 MG tablet Take 1 tablet (40 mg total) by mouth every other day. 04/22/21   Lillard Anes, MD  levothyroxine (SYNTHROID) 125 MCG tablet Take 1 tablet (125 mcg total) by mouth daily. 03/03/22   Rip Harbour, NP  losartan (COZAAR) 50 MG tablet Take 1 tablet (50 mg total) by mouth daily. 12/27/21   Rip Harbour, NP  minocycline (MINOCIN) 50 MG capsule Take 1 capsule (50 mg total) by mouth daily. 09/28/21   Lillard Anes, MD  Omega-3 Fatty Acids (FISH OIL) 1200 MG CPDR Take 1 capsule by mouth daily.    [provider]  omeprazole (PRILOSEC) 40 MG capsule Take 1 capsule (40 mg total) by mouth daily.  03/03/22   Rip Harbour, NP  Potassium Bicarbonate 99 MG CAPS Take by mouth.    [provider]  SYMBICORT 160-4.5 MCG/ACT inhaler Inhale into the lungs. 12/21/21   [provider]      Allergies    Patient has no known allergies.    Review of Systems   Review of Systems  Neurological:        Left arm and leg weakness; difficulty speaking; left face droop  All other systems reviewed and are negative.   Physical Exam Updated Vital Signs BP 129/74 (BP Location: Right Arm)   Pulse (!) 2   Temp (!) 97.3 F (36.3 C) (Oral)   Resp 18   Wt 83 kg   SpO2 96%   BMI 30.45 kg/m  Physical Exam Vitals and nursing note reviewed.  Constitutional:      Appearance: Normal appearance.  HENT:     Head: Normocephalic and atraumatic.     Right Ear: External ear normal.     Left Ear: External ear normal.     Nose: Nose normal.     Mouth/Throat:     Mouth: Mucous membranes are moist.     Pharynx: Oropharynx is clear.  Eyes:     Extraocular Movements: Extraocular movements intact.     Conjunctiva/sclera: Conjunctivae normal.  Pupils: Pupils are equal, round, and reactive to light.  Cardiovascular:     Rate and Rhythm: Normal rate and regular rhythm.     Pulses: Normal pulses.     Heart sounds: Normal heart sounds.  Pulmonary:     Effort: Pulmonary effort is normal.     Breath sounds: Normal breath sounds.  Abdominal:     General: Abdomen is flat. Bowel sounds are normal.     Palpations: Abdomen is soft.  Musculoskeletal:        General: Normal range of motion.     Cervical back: Normal range of motion and neck supple.  Skin:    General: Skin is warm.     Capillary Refill: Capillary refill takes less than 2 seconds.  Neurological:     Mental Status: He is alert.     Comments: Left facial droop, left arm and leg weakness; right gaze preference  Psychiatric:        Mood and Affect: Mood normal.     ED Results / Procedures / Treatments   Labs (all labs  ordered are listed, but only abnormal results are displayed) Labs Reviewed  COMPREHENSIVE METABOLIC PANEL - Abnormal; Notable for the following components:      Result Value   Glucose, Bld 106 (*)    BUN 25 (*)    Calcium 8.8 (*)    Total Protein 5.8 (*)    Albumin 3.0 (*)    All other components within normal limits  I-STAT CHEM 8, ED - Abnormal; Notable for the following components:   BUN 28 (*)    Glucose, Bld 107 (*)    Calcium, Ion 1.00 (*)    All other components within normal limits  CBG MONITORING, ED - Abnormal; Notable for the following components:   Glucose-Capillary 105 (*)    All other components within normal limits  RESP PANEL BY RT-PCR (RSV, FLU A&B, COVID)  RVPGX2  CBC  DIFFERENTIAL  ETHANOL  PROTIME-INR  APTT    EKG EKG Interpretation  Date/Time:  Sunday March 06 2022 16:03:47 EST Ventricular Rate:  70 PR Interval:  243 QRS Duration: 84 QT Interval:  434 QTC Calculation: 469 R Axis:   -46 Text Interpretation: Sinus rhythm Prolonged PR interval Inferior infarct, old No significant change since last tracing Confirmed by Isla Pence [phone]) on 03/06/2022 4:08:43 PM  Radiology CT CEREBRAL PERFUSION W CONTRAST  Result Date: 03/06/2022 EXAM: CT PERFUSION BRAIN TECHNIQUE: Multiphase CT imaging of the brain was performed following IV bolus contrast injection. Subsequent parametric perfusion maps were calculated using RAPID software. RADIATION DOSE REDUCTION: This exam was performed according to the departmental dose-optimization program which includes automated exposure control, adjustment of the mA and/or kV according to patient size and/or use of iterative reconstruction technique. CONTRAST:  22m OMNIPAQUE IOHEXOL 350 MG/ML SOLN, 446mOMNIPAQUE IOHEXOL 350 MG/ML SOLN COMPARISON:  Same day CTA and CT head. FINDINGS: CT Brain Perfusion Findings: CBF (<30%) Volume: 76m42merfusion (Tmax>6.0s) volume: 36m68msmatch Volume: 36mL27mECTS on noncontrast CT Head: 10  today. Infarct Core: 0 mL Infarction Location:None identified IMPRESSION: 1. Approximally 23 mL of penumbra in the right MCA territory, correlating with occluded vessel seen on CTA. 2. No evidence of core infarct. Findings discussed with Dr. StackQuinn Axetelephone at 3:56 p.m. Electronically Signed   By: FredeMargaretha Sheffield   On: 03/06/2022 15:58   CT ANGIO HEAD NECK W WO CM (CODE STROKE)  Result Date: 03/06/2022 CLINICAL DATA:  Neuro deficit, acute,  stroke suspected L sided weakness R gaze preference EXAM: CT ANGIOGRAPHY HEAD AND NECK TECHNIQUE: Multidetector CT imaging of the head and neck was performed using the standard protocol during bolus administration of intravenous contrast. Multiplanar CT image reconstructions and MIPs were obtained to evaluate the vascular anatomy. Carotid stenosis measurements (when applicable) are obtained utilizing NASCET criteria, using the distal internal carotid diameter as the denominator. RADIATION DOSE REDUCTION: This exam was performed according to the departmental dose-optimization program which includes automated exposure control, adjustment of the mA and/or kV according to patient size and/or use of iterative reconstruction technique. COMPARISON:  CTA Head March 29, 2021. FINDINGS: CTA NECK FINDINGS Aortic arch: Great vessel origins are patent. Right carotid system: Ulcerated atherosclerosis at the carotid bifurcation without greater than 50% stenosis. Left carotid system: Left ICA is occluded at its origin. Non opacified more distal ICA in the neck. Vertebral arteries: Chronically occluded right vertebral artery. Skeleton: No acute findings on limited assessment. Other neck: No acute findings on limited assessment. Upper chest: Visualized lung apices are clear when accounting for expiratory changes. Review of the MIP images confirms the above findings CTA HEAD FINDINGS Anterior circulation: Chronically occluded left ICA with reconstitution at the carotid terminus.  Right ICA and right M1 MCA is patent. Occluded right mid M2 MCA branch in the region of a probable calcific embolus (series 10, image 82), which appears new from prior. Bilateral ACAs are patent. Posterior circulation: Chronically clear right vertebral artery. Left vertebral artery, basilar artery and bilateral posterior cerebral arteries are patent without proximal high-grade stenosis. Left fetal type PCA. Venous sinuses: As permitted by contrast timing, patent. Review of the MIP images confirms the above findings IMPRESSION: 1. Occluded right mid M2 MCA (series 10, image 82) likely due to calcified embolus. 2. Chronically occluded right vertebral artery and left ICA. FIndings discussed with Dr. Quinn Axe at [facility] PM. Electronically Signed   By: Margaretha Sheffield M.D.   On: 03/06/2022 15:31   CT HEAD CODE STROKE WO CONTRAST  Result Date: 03/06/2022 CLINICAL DATA:  Code stroke.  Neuro deficit, acute, stroke suspected EXAM: CT HEAD WITHOUT CONTRAST TECHNIQUE: Contiguous axial images were obtained from the base of the skull through the vertex without intravenous contrast. RADIATION DOSE REDUCTION: This exam was performed according to the departmental dose-optimization program which includes automated exposure control, adjustment of the mA and/or kV according to patient size and/or use of iterative reconstruction technique. COMPARISON:  CT head March 29, 2021. FINDINGS: Brain: Similar remote infarcts in the left anterior temporal lobe, left MCA territory and right temporal lobe. No evidence of acute large vascular territory infarct, acute hemorrhage, mass lesion, midline shift or hydrocephalus. Vascular: No hyperdense vessel identified. Calcific atherosclerosis. Skull: No acute fracture. Sinuses/Orbits: Clear sinuses.  No acute orbital findings. Other: No mastoid effusions. ASPECTS [facility] Stroke Program Early CT Score) total score (0-10 with 10 being normal): 10. IMPRESSION: 1. No evidence of acute intracranial  abnormality.  ASPECTS 10. 2. Similar remote infarcts, as above. Code stroke imaging results were communicated on 03/06/2022 at 3:19 pm to provider [facility] via secure text paging. Electronically Signed   By: Margaretha Sheffield M.D.   On: 03/06/2022 15:19    Procedures Procedures    Medications Ordered in ED Medications  sodium chloride flush (NS) 0.9 % injection 3 mL (3 mLs Intravenous Given by Other 03/06/22 1500)  iohexol (OMNIPAQUE) 350 MG/ML injection 75 mL (75 mLs Intravenous Contrast Given 03/06/22 1523)  iohexol (OMNIPAQUE) 350 MG/ML injection 40 mL (40  mLs Intravenous Contrast Given 03/06/22 1541)  iohexol (OMNIPAQUE) 350 MG/ML injection 40 mL (40 mLs Intravenous Contrast Given 03/06/22 1549)    ED Course/ Medical Decision Making/ A&P                             Medical Decision Making Amount and/or Complexity of Data Reviewed Labs: ordered. Radiology: ordered.  Risk Decision regarding hospitalization.   This patient presents to the ED for concern of cva, this involves an extensive number of treatment options, and is a complaint that carries with it a high risk of complications and morbidity.  The differential diagnosis includes ischemic cva, hemorrhagic cva, tia   Co morbidities that complicate the patient evaluation  hld, hypothyroidism, htn, tia, laryngeal cancer, PVD, carotid stenosis, idiopathic pulmonary fibrosis requiring oxygen at night, and migraines   Additional history obtained:  Additional history obtained from epic chart review External records from outside source obtained and reviewed including EMS report   Lab Tests:  I Ordered, and personally interpreted labs.  The pertinent results include:  cbc nl, cmp nl, etoh neg, inr 1.2   Imaging Studies ordered:  I ordered imaging studies including ct head/cta  I independently visualized and interpreted imaging which showed  CT head: . No evidence of acute intracranial abnormality.  ASPECTS 10.  2. Similar  remote infarcts, as above.  CTA head/neck: 1. Occluded right mid M2 MCA (series 10, image 82) likely due to  calcified embolus.  2. Chronically occluded right vertebral artery and left ICA.   I agree with the radiologist interpretation   Cardiac Monitoring:  The patient was maintained on a cardiac monitor.  I personally viewed and interpreted the cardiac monitored which showed an underlying rhythm of: nsr   Medicines ordered and prescription drug management:  I have reviewed the patients home medicines and have made adjustments as needed   Test Considered:  cta   Critical Interventions:  Code stroke   Consultations Obtained:  I requested consultation with the neurologist (Dr. Quinn Axe),  and discussed lab and imaging findings as well as pertinent plan - she met pt at the bridge and took him to the [place] scanner. He was found to have an occluded thrombus to the right mid-MCA.  Dr. Quinn Axe obtained consent from his cousin (POA) and will send pt to IR.  Problem List / ED Course:  CVA:  pt to IR   Reevaluation:  After the interventions noted above, I reevaluated the patient and found that they have :improved   Social Determinants of Health:  Lives at home   Dispostion:  After consideration of the diagnostic results and the patients response to treatment, I feel that the patent would benefit from admission.    CRITICAL CARE Performed by: Isla Pence   Total critical care time: 30 minutes  Critical care time was exclusive of separately billable procedures and treating other patients.  Critical care was necessary to treat or prevent imminent or life-threatening deterioration.  Critical care was time spent personally by me on the following activities: development of treatment plan with patient and/or surrogate as well as nursing, discussions with consultants, evaluation of patient's response to treatment, examination of patient, obtaining history from patient or  surrogate, ordering and performing treatments and interventions, ordering and review of laboratory studies, ordering and review of radiographic studies, pulse oximetry and re-evaluation of patient's condition.         Final Clinical Impression(s) / ED  Diagnoses Final diagnoses:  Acute ischemic stroke [facility])    Rx / DC Orders ED Discharge Orders     None         Isla Pence, MD 03/06/22 1616

## 2022-03-07 ENCOUNTER — Inpatient Hospital Stay (HOSPITAL_COMMUNITY): Payer: Medicare PPO

## 2022-03-07 ENCOUNTER — Encounter (HOSPITAL_COMMUNITY): Payer: Self-pay | Admitting: Radiology

## 2022-03-07 DIAGNOSIS — I6522 Occlusion and stenosis of left carotid artery: Secondary | ICD-10-CM

## 2022-03-07 DIAGNOSIS — I6602 Occlusion and stenosis of left middle cerebral artery: Secondary | ICD-10-CM | POA: Diagnosis not present

## 2022-03-07 DIAGNOSIS — I639 Cerebral infarction, unspecified: Secondary | ICD-10-CM | POA: Diagnosis not present

## 2022-03-07 DIAGNOSIS — I160 Hypertensive urgency: Secondary | ICD-10-CM

## 2022-03-07 LAB — COMPREHENSIVE METABOLIC PANEL
ALT: 27 U/L (ref 0–44)
AST: 33 U/L (ref 15–41)
Albumin: 3 g/dL — ABNORMAL LOW (ref 3.5–5.0)
Alkaline Phosphatase: 87 U/L (ref 38–126)
Anion gap: 10 (ref 5–15)
BUN: 15 mg/dL (ref 8–23)
CO2: 24 mmol/L (ref 22–32)
Calcium: 8.8 mg/dL — ABNORMAL LOW (ref 8.9–10.3)
Chloride: 104 mmol/L (ref 98–111)
Creatinine, Ser: 0.85 mg/dL (ref 0.61–1.24)
GFR, Estimated: >60 mL/min (ref >60)
Glucose, Bld: 92 mg/dL (ref 70–99)
Potassium: 3.8 mmol/L (ref 3.5–5.1)
Sodium: 138 mmol/L (ref 135–145)
Total Bilirubin: 0.7 mg/dL (ref 0.3–1.2)
Total Protein: 5.6 g/dL — ABNORMAL LOW (ref 6.5–8.1)

## 2022-03-07 LAB — CBC WITH DIFFERENTIAL/PLATELET
Abs Immature Granulocytes: 0.04 10*3/uL (ref 0.00–0.07)
Basophils Absolute: 0 10*3/uL (ref 0.0–0.1)
Basophils Relative: 0 %
Eosinophils Absolute: 0.2 10*3/uL (ref 0.0–0.5)
Eosinophils Relative: 2 %
HCT: 39.5 % (ref 39.0–52.0)
Hemoglobin: 13.6 g/dL (ref 13.0–17.0)
Immature Granulocytes: 0 %
Lymphocytes Relative: 10 %
Lymphs Abs: 1 10*3/uL (ref 0.7–4.0)
MCH: 31.9 pg (ref 26.0–34.0)
MCHC: 34.4 g/dL (ref 30.0–36.0)
MCV: 92.7 fL (ref 80.0–100.0)
Monocytes Absolute: 1.1 10*3/uL — ABNORMAL HIGH (ref 0.1–1.0)
Monocytes Relative: 10 %
Neutro Abs: 8.4 10*3/uL — ABNORMAL HIGH (ref 1.7–7.7)
Neutrophils Relative %: 78 %
Platelets: 242 10*3/uL (ref 150–400)
RBC: 4.26 MIL/uL (ref 4.22–5.81)
RDW: 13.9 % (ref 11.5–15.5)
WBC: 10.8 10*3/uL — ABNORMAL HIGH (ref 4.0–10.5)
nRBC: 0 % (ref 0.0–0.2)

## 2022-03-07 LAB — LIPID PANEL
Cholesterol: 87 mg/dL (ref 0–200)
HDL: 36 mg/dL — ABNORMAL LOW (ref >40)
LDL Cholesterol: 36 mg/dL (ref 0–99)
Total CHOL/HDL Ratio: 2.4 RATIO
Triglycerides: 75 mg/dL (ref <150)
VLDL: 15 mg/dL (ref 0–40)

## 2022-03-07 LAB — ECHOCARDIOGRAM COMPLETE
Area-P 1/2: 3.77 cm2
Calc EF: 67.1 %
S' Lateral: 2.9 cm
Single Plane A2C EF: 70.9 %
Single Plane A4C EF: 63 %
Weight: 2927.71 oz

## 2022-03-07 MED ORDER — STUDY - LIBREXIA-STROKE - MILVEXIAN 25 MG OR PLACEBO TABLET (PI-SETHI)
1.0000 | ORAL_TABLET | Freq: Two times a day (BID) | ORAL | Status: DC
  Administered 2022-03-07 – 2022-03-08 (×3): 1 via ORAL
  Filled 2022-03-07 (×3): qty 1

## 2022-03-07 MED ORDER — ENOXAPARIN SODIUM 40 MG/0.4ML IJ SOSY: 40.0000 mg | PREFILLED_SYRINGE | Freq: Every day | INTRAMUSCULAR | Status: DC

## 2022-03-07 MED ORDER — LABETALOL HCL 5 MG/ML IV SOLN
5.0000 mg | INTRAVENOUS | Status: DC | PRN
  Administered 2022-03-08: 5 mg via INTRAVENOUS
  Filled 2022-03-07: qty 4

## 2022-03-07 MED ORDER — MORPHINE SULFATE (PF) 2 MG/ML IV SOLN
2.0000 mg | Freq: Once | INTRAVENOUS | Status: AC
  Administered 2022-03-07: 2 mg via INTRAVENOUS
  Filled 2022-03-07: qty 1

## 2022-03-07 NOTE — Evaluation (Signed)
Physical Therapy Evaluation Patient Details Name: James Richards MRN: [id number] DOB: May 17, 1945 Today's Date: 03/07/2022  History of Present Illness  James Richards is a 77 y.o. male  who presented to the ED 03/06/22 via EMS from home for acute onset of left-sided weakness and slurred speech. PMH: HTN, chronic left carotid stenosis and chronic occlusion of right vertebral artery, recurrent TIAs, CVAs including CRAO in February of 2023, amaurosis fugax, tobacco use, malignant neoplasm of tongue and larynx s/p chemo and radiation and right neck lymph node dissection 2009 with recurrence in 2016 s/p partial glossectomy, mixed hyperlipidemia, PVD, chronic diastolic heart failure, idiopathic pulmonary fibrosis with chronic hypoxia, and RLS   Clinical Impression  Pt admitted with above. Pt with extensive medical history including multiple strokes and metastatic cancer. Pt presenting with L UE and LE weakness and impaired co-ordination, impaired balance, vision deficits (residual L eye visual impairement), decreased safety awareness, and increased fall risk. Pt functioning at min A level of function. Ambulation assessment limited this date by onset of excessive bleeding from L IV site in elbow requiring RN attention and increased time to stop bleeding. PT to cont to follow and assess d/c recommendations and use of rolling walker. Suspect pt will progress well to be able to go home with 24/7 assist and HHPT but will continue to assess.       Recommendations for follow up therapy are one component of a multi-disciplinary discharge planning process, led by the attending physician.  Recommendations may be updated based on patient status, additional functional criteria and insurance authorization.  Follow Up Recommendations Home health PT (pending progress)      Assistance Recommended at Discharge Frequent or constant Supervision/Assistance  Patient can return home with the following  A little help with  walking and/or transfers;A little help with bathing/dressing/bathroom;Direct supervision/assist for medications management;Assist for transportation;Help with stairs or ramp for entrance    Equipment Recommendations None recommended by PT (may benefit from RW, will assess next PT session)  Recommendations for Other Services       Functional Status Assessment Patient has had a recent decline in their functional status and demonstrates the ability to make significant improvements in function in a reasonable and predictable amount of time.     Precautions / Restrictions Precautions Precautions: Fall Restrictions Weight Bearing Restrictions: No      Mobility  Bed Mobility Overal bed mobility: Needs Assistance Bed Mobility: Supine to Sit     Supine to sit: Supervision     General bed mobility comments: HOB slightly elevated, pt able to bring LEs off EOB, used bed rail on R side to pull self up and over to L side of bed    Transfers Overall transfer level: Needs assistance Equipment used: 1 person hand held assist Transfers: Sit to/from Stand Sit to Stand: Min assist           General transfer comment: minA to power up, pt with fear of falling, wide base of support, slow and guarded    Ambulation/Gait Ambulation/Gait assistance: Min assist Gait Distance (Feet): 12 Feet (from one side of the bed to the other) Assistive device: 1 person hand held assist, IV Pole Gait Pattern/deviations: Step-through pattern, Decreased stride length, Wide base of support Gait velocity: dec Gait velocity interpretation: <1.31 ft/sec, indicative of household ambulator   General Gait Details: L knee flexion, wide base of support with short step height and length. distance limited by excessive bleeding from IV in L elbow. RN called  to address  Stairs            Wheelchair Mobility    Modified Rankin (Stroke Patients Only) Modified Rankin (Stroke Patients Only) Pre-Morbid Rankin  Score: Moderate disability Modified Rankin: Moderately severe disability     Balance Overall balance assessment: Needs assistance Sitting-balance support: Feet supported, No upper extremity supported Sitting balance-Leahy Scale: Fair     Standing balance support: During functional activity, Single extremity supported, Reliant on assistive device for balance Standing balance-Leahy Scale: Fair Standing balance comment: reliant on external support for safe ambulation                             Pertinent Vitals/Pain Pain Assessment Pain Assessment: Faces Faces Pain Scale: Hurts little more Pain Location: R shld/UE Pain Descriptors / Indicators: Discomfort Pain Intervention(s): Monitored during session    Home Living Family/patient expects to be discharged to:: Private residence Living Arrangements: Other (Comment) (cousin) Available Help at Discharge: Family;Available 24 hours/day Type of Home: House Home Access: Stairs to enter Entrance Stairs-Rails:  (no rail but a post) Technical brewer of Steps: 2   Home Layout: Laundry or work area in [place]: Conservation officer, nature (2 wheels);Cane - single point Additional Comments: lives with cousin    Prior Function Prior Level of Function : Independent/Modified Independent             Mobility Comments: no AD ADLs Comments: drives short distances, able to make meals for self     Hand Dominance   Dominant Hand: Right    Extremity/Trunk Assessment   Upper Extremity Assessment Upper Extremity Assessment: Defer to OT evaluation (noted fine motor deficits and L UE weakness when attempting to open sock package and don socks)    Lower Extremity Assessment Lower Extremity Assessment: Generalized weakness (L LE weaker than R LE but no buckling during ambulation)    Cervical / Trunk Assessment Cervical / Trunk Assessment: Normal  Communication   Communication: Expressive difficulties;Other  (comment);HOH (dysarthric due to partial tongue resection)  Cognition Arousal/Alertness: Awake/alert Behavior During Therapy: WFL for tasks assessed/performed Overall Cognitive Status: No family/caregiver present to determine baseline cognitive functioning                                 General Comments: pt oriented x4, pt aware of deficits and medical history however demos impaired safety awareness, I do however suspect this to be baseline, pt with contradicting report of information however pt also very HOH  and suspect this to contribute to impaired comprehension        General Comments General comments (skin integrity, edema, etc.): pt with excessive bleeding from IV in L elbow, RN came to address. VSS on RA    Exercises     Assessment/Plan    PT Assessment Patient needs continued PT services  PT Problem List Decreased strength;Decreased activity tolerance;Decreased balance;Decreased mobility;Decreased coordination;Decreased cognition       PT Treatment Interventions DME instruction;Gait training;Stair training;Functional mobility training;Therapeutic activities;Therapeutic exercise;Balance training;Neuromuscular re-education    PT Goals (Current goals can be found in the Care Plan section)  Acute Rehab PT Goals Patient Stated Goal: home PT Goal Formulation: With patient Time For Goal Achievement: 03/21/22 Potential to Achieve Goals: Good    Frequency Min 4X/week     Co-evaluation               AM-PAC  PT "6 Clicks" Mobility  Outcome Measure Help needed turning from your back to your side while in a flat bed without using bedrails?: A Little Help needed moving from lying on your back to sitting on the side of a flat bed without using bedrails?: A Little Help needed moving to and from a bed to a chair (including a wheelchair)?: A Little Help needed standing up from a chair using your arms (e.g., wheelchair or bedside chair)?: A Lot Help needed to  walk in hospital room?: A Lot Help needed climbing 3-5 steps with a railing? : A Lot 6 Click Score: 15    End of Session Equipment Utilized During Treatment: Gait belt Activity Tolerance: Patient tolerated treatment well Patient left: in chair;with call bell/phone within reach;with chair alarm set Nurse Communication: Mobility status (excessive bleeding from iv in L elbow) PT Visit Diagnosis: Unsteadiness on feet (R26.81);Muscle weakness (generalized) (M62.81)    Time: 2197-5883 PT Time Calculation (min) (ACUTE ONLY): 39 min   Charges:   PT Evaluation $PT Eval Moderate Complexity: 1 Mod PT Treatments $Gait Training: 8-22 mins $Therapeutic Activity: 8-22 mins        Kittie Plater, PT, DPT Acute Rehabilitation Services Secure chat preferred Office #: [phone]   Berline Lopes 03/07/2022, 12:27 PM

## 2022-03-07 NOTE — Progress Notes (Signed)
OT Cancellation Note  Patient Details Name: James Richards MRN: [id number] DOB: 1945/03/18   Cancelled Treatment:    Reason Eval/Treat Not Completed: Active bedrest order  [facility] 03/07/2022, 7:41 AM Maurie Boettcher, OT/L   Acute OT Clinical Specialist Acute Rehabilitation Services Pager [phone] Office [phone]

## 2022-03-07 NOTE — Progress Notes (Signed)
  Echocardiogram 2D Echocardiogram has been performed.  James Richards 03/07/2022, 8:56 AM

## 2022-03-07 NOTE — TOC Initial Note (Signed)
Transition of Care [facility]) - Initial/Assessment Note    Patient Details  Name: James Richards MRN: [id number] Date of Birth: Jul 18, 1945  Transition of Care [facility] CM/SW Contact:    Pollie Friar, RN Phone Number: 03/07/2022, 2:40 PM  Clinical Narrative:                 Pt lives at home with his cousin. CM met with the patient and spoke to the cousin (with permission). Cousin is able to provide need supervision. She is also going to over see his medications at home. She does the needed transportation. Home health options provided to patient and cousin. They had no preference so HH arranged with [facility] with information on the AVS. HH RN added to go over medications at home and make sure medications are being taken properly. Cousin will transport home when medically ready.  Expected Discharge Plan: [place] Barriers to Discharge: Continued Medical Work up   Patient Goals and CMS Choice   CMS Medicare.gov Compare Post Acute Care list provided to:: Patient Choice offered to / list presented to : Patient, [facility] POA / Guardian      Expected Discharge Plan and Services   Discharge Planning Services: CM Consult Post Acute Care Choice: [place] arrangements for the past 2 months: Single Family Home                           HH Arranged: RN, PT, OT, Speech Therapy HH Agency: [facility] Date [place]: 03/07/22   Representative spoke with at [place]: Claiborne Billings  Prior Living Arrangements/Services Living arrangements for the past 2 months: [place] Lives with:: Relatives ([place]) Patient language and need for interpreter reviewed:: Yes Do you feel safe going back to the place where you live?: Yes      Need for Family Participation in Patient Care: Yes (Comment) Care giver support system in place?: Yes (comment)   Criminal Activity/Legal Involvement Pertinent to Current Situation/Hospitalization: No - Comment as  needed  Activities of Daily Living      Permission Sought/Granted                  Emotional Assessment Appearance:: Appears stated age Attitude/Demeanor/Rapport: Engaged Affect (typically observed): Accepting Orientation: : Oriented to Self, Oriented to Place, Oriented to Situation, Oriented to  Time   Psych Involvement: No (comment)  Admission diagnosis:  Acute ischemic stroke [facility]) [I63.9] Middle cerebral artery embolism, left [I66.02] Patient Active Problem List   Diagnosis Date Noted   Left-sided weakness 03/06/2022   Essential hypertension 03/06/2022   Middle cerebral artery embolism, left 03/06/2022   Hearing loss of both ears due to cerumen impaction 08/24/2021   Otitis media, chronic with perforation 07/27/2021   Otitis media, purulent, acute, with spontaneous rupture of TM 07/27/2021   Central retinal artery occlusion of left eye 03/31/2021   Occlusion of extracranial carotid artery, left 03/31/2021   Hypertensive heart disease with chronic diastolic congestive heart failure ([place]) 06/23/2020   Mixed hearing loss 75/64/3329   Diastolic dysfunction 51/88/4166   Restrictive lung disease 11/26/2019   BMI 30.0-30.9,adult 07/07/2019   Other specified hypothyroidism    Hyperlipidemia    Restless legs syndrome    History of TIA (transient ischemic attack) 12/11/2014   Carotid stenosis 11/24/2014   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   Migraine 04/02/2014   Atherosclerosis of native artery of extremity with intermittent claudication (  [place]) 10/09/2013   PCP:  Rip Harbour, NP Pharmacy:   [facility] - [place], [place] [address] Phone: [phone] Fax: [phone]  [facility] [place], [place] - [address] [place] [address] Phone: [phone] Fax: [phone]     Social Determinants of Health  (SDOH) Social History: SDOH Screenings   Food Insecurity: No Food Insecurity (08/26/2021)  Housing: Low Risk  (08/26/2021)  Transportation Needs: No Transportation Needs (08/26/2021)  Utilities: Not At Risk (12/27/2021)  Alcohol Screen: Low Risk  (12/27/2021)  Depression (PHQ2-9): Low Risk  (12/27/2021)  Financial Resource Strain: Low Risk  (08/26/2021)  Physical Activity: Inactive (08/26/2021)  Social Connections: Moderately Isolated (08/26/2021)  Stress: No Stress Concern Present (08/26/2021)  Tobacco Use: Medium Risk (03/07/2022)   SDOH Interventions:     Readmission Risk Interventions     No data to display

## 2022-03-07 NOTE — Progress Notes (Signed)
Study Discontinued on 26Feb2024  [facility] New Study Start: LIBREXIA-STROKE   SUMMARY For more information refer to: ClinicalTrials.gov. Study Identifier: [id number] A Phase 3, Randomized, Double-Blind, Parallel-Group, Placebo-Controlled Study to Demonstrate the Efficacy and Safety of Milvexian, an Oral Factor XIa Inhibitor, for Stroke Prevention after an Acute Ischemic Stroke or High-Risk Transient Ischemic Attack  Brief Summary This study will evaluate the efficacy and safety of milvexian in participants after an acute ischemic stroke or high-risk TIA who are receiving antiplatelet therapy standard-of-care.  Design Phase 3, Randomized, Double-Blind, Interventional, Event-Driven  Intervention [id number] (Milvexian) 25 mg or Placebo     Concomitant Therapy Participants will receive SAPT or DAPT. The SAPT or DAPT may be started prior to randomization and the type of antiplatelet agent(s), and duration of treatment will be at the discretion of the investigator. If ASA is used, it will be limited to low dose (75 to 100 mg/day) NSAID (except ASA) may be used concomitantly on a temporary basis but should be avoided for chronic use more than 4 weeks of consecutive therapy)  Prohibited Therapy Chronic (>4 weeks of consecutive use) use of ASA >100 mg per day Current or planned use of isoniazid Concomitant use of omeprazole or esomeprazole with clopidogrel is prohibited. Other use of PPI is allowed and encouraged Additional anticoagulants (e.g., vitamin k antagonists, factor IIa or FXa inhibitors) Use of a combined P-gp and strong CYP3A4 inhibitor (e.g., atazanavir, clarithromycin, itraconazole, ketoconazole, ritonavir, saquinavir) within 7 days of receiving study intervention and during the study is prohibited Use of a combined P-gp and strong CYP3A4 inducer (e.g., carbamazepine, phenytoin, rifampin) within 7 days of receiving study intervention and during the study is  prohibited * Prohibited therapies may be administered on a temporary basis, and if administered, the investigator should discontinue the study intervention. Study intervention may be restarted after the prohibited therapy has been discontinued and after the completion of a suitable washout period at the investigator's discretion  Anticoagulation Prophylaxis The use of anticoagulants for post-stroke DVT prophylaxis after the 3-day window is prohibited and non-pharmacological prophylaxis (e.g., intermittent pneumatic compression) is recommended.   Potential Drug-Drug Interactions Milvexian metabolism: Substrate of CYP3A4; use caution with coadministration of strong CYP3A4 inducers and inhibitors  Administration  Take 1 tablet by mouth twice daily without regards to food intake, at approximately the same time each day. For participants unable to swallow medication, the tablet can be dispersed in water and given via NG tube or in applesauce.  Missed dose If a dose of medication is missed, the dose should be taken as soon as possible. If the missed dose cannot be taken at regular time, next dose should not be doubled. Resume at the next scheduled dose      Plan: Start [Milvexian 25 mg tablets or placebo] today. Study medication must be picked up from pharmacy, medication can not be tubed.    Please contact [facility]S if any questions or concerns regarding the study medication.    Jahir Halt, PharmD, BCPS Investigational Drug Service Pharmacist  [phone]  

## 2022-03-07 NOTE — Progress Notes (Addendum)
STROKE TEAM PROGRESS NOTE   INTERVAL HISTORY Patient sitting in chair, no family at bedside.  BP parameters updated to reflect permissive hypertension, ok to transfer from ICU.   Vitals:   03/07/22 1100 03/07/22 1200 03/07/22 1231 03/07/22 1231  BP: (!) 176/110 (!) 170/103 (!) 158/105 (!) 158/105  Pulse: 85 88  87  Resp:   17 17  Temp:    98.5 F (36.9 C)  TempSrc:    Oral  SpO2: 99% 95%  99%  Weight:       CBC:  Recent Labs  Lab 03/06/22 1457 03/06/22 1504 03/07/22 0454  WBC 7.4  --  10.8*  NEUTROABS 5.0  --  8.4*  HGB 14.1 13.9 13.6  HCT 41.5 41.0 39.5  MCV 91.2  --  92.7  PLT 215  --  010   Basic Metabolic Panel:  Recent Labs  Lab 03/06/22 1457 03/06/22 1504 03/07/22 0454  NA 137 139 138  K 3.9 4.0 3.8  CL 103 104 104  CO2 23  --  24  GLUCOSE 106* 107* 92  BUN 25* 28* 15  CREATININE 1.05 1.00 0.85  CALCIUM 8.8*  --  8.8*   Lipid Panel:  Recent Labs  Lab 03/07/22 0454  CHOL 87  TRIG 75  HDL 36*  CHOLHDL 2.4  VLDL 15  LDLCALC 36   HgbA1c:  Recent Labs  Lab 03/06/22 1619  HGBA1C 5.7*   Urine Drug Screen:  Recent Labs  Lab 03/06/22 1910  LABOPIA NONE DETECTED  COCAINSCRNUR NONE DETECTED  LABBENZ NONE DETECTED  AMPHETMU NONE DETECTED  THCU NONE DETECTED  LABBARB NONE DETECTED    Alcohol Level  Recent Labs  Lab 03/06/22 1457  ETH <10    IMAGING past 24 hours ECHOCARDIOGRAM COMPLETE  Result Date: 03/07/2022    ECHOCARDIOGRAM REPORT   Patient Name:   James Richards Date of Exam: 03/07/2022 Medical Rec #:  [id number]     Height:       65.0 in Accession #:    [id number]    Weight:       183.0 lb Date of Birth:  1945/08/30    BSA:          1.905 m Patient Age:    77 years      BP:           116/85 mmHg Patient Gender: M             HR:           83 bpm. Exam Location:  Inpatient Procedure: 2D Echo, Color Doppler and Cardiac Doppler Indications:    Stroke  History:        Patient has prior history of Echocardiogram examinations, most                  recent 03/30/2021. CHF, Carotid Disease, TIA and Stroke,                 Arrythmias:Atrial Fibrillation; Risk Factors:Hypertension,                 Former Smoker and Dyslipidemia. 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  Sonographer:    Eartha Inch Referring Phys: [id number] Rikki Spearing  Sonographer Comments: Image acquisition challenging due to  patient body habitus and Image acquisition challenging due to respiratory motion. IMPRESSIONS  1. Left ventricular ejection fraction, by estimation, is 60 to 65%. The left ventricle has normal function. The left ventricle has no regional wall motion abnormalities. Left ventricular diastolic parameters are consistent with Grade II diastolic dysfunction (pseudonormalization). Elevated left ventricular end-diastolic pressure.  2. Right ventricular systolic function is normal. The right ventricular size is normal.  3. The mitral valve is normal in structure. No evidence of mitral valve regurgitation. No evidence of mitral stenosis.  4. The aortic valve is normal in structure. Aortic valve regurgitation is not visualized. No aortic stenosis is present.  5. The inferior vena cava is normal in size with greater than 50% respiratory variability, suggesting right atrial pressure of 3 mmHg. FINDINGS  Left Ventricle: Left ventricular ejection fraction, by estimation, is 60 to 65%. The left ventricle has normal function. The left ventricle has no regional wall motion abnormalities. The left ventricular internal cavity size was normal in size. There is  no left ventricular hypertrophy. Left ventricular diastolic parameters are consistent with Grade II diastolic dysfunction (pseudonormalization). Elevated left ventricular end-diastolic pressure. Right Ventricle: The right ventricular size is normal. No increase in right ventricular wall thickness. Right ventricular systolic function is normal.  Left Atrium: Left atrial size was normal in size. Right Atrium: Right atrial size was normal in size. Pericardium: There is no evidence of pericardial effusion. Mitral Valve: The mitral valve is normal in structure. No evidence of mitral valve regurgitation. No evidence of mitral valve stenosis. Tricuspid Valve: The tricuspid valve is normal in structure. Tricuspid valve regurgitation is not demonstrated. No evidence of tricuspid stenosis. Aortic Valve: The aortic valve is normal in structure. Aortic valve regurgitation is not visualized. No aortic stenosis is present. Pulmonic Valve: The pulmonic valve was normal in structure. Pulmonic valve regurgitation is not visualized. No evidence of pulmonic stenosis. Aorta: The aortic root and ascending aorta are structurally normal, with no evidence of dilitation. Venous: The inferior vena cava is normal in size with greater than 50% respiratory variability, suggesting right atrial pressure of 3 mmHg. IAS/Shunts: No atrial level shunt detected by color flow Doppler.  LEFT VENTRICLE PLAX 2D LVIDd:         3.90 cm     Diastology LVIDs:         2.90 cm     LV e' medial:    5.87 cm/s LV PW:         1.00 cm     LV E/e' medial:  15.8 LV IVS:        1.00 cm     LV e' lateral:   7.72 cm/s LVOT diam:     2.00 cm     LV E/e' lateral: 12.0 LV SV:         88 LV SV Index:   46 LVOT Area:     3.14 cm  LV Volumes (MOD) LV vol d, MOD A2C: 58.7 ml LV vol d, MOD A4C: 72.7 ml LV vol s, MOD A2C: 17.1 ml LV vol s, MOD A4C: 26.9 ml LV SV MOD A2C:     41.6 ml LV SV MOD A4C:     72.7 ml LV SV MOD BP:      45.4 ml RIGHT VENTRICLE RV S prime:     11.10 cm/s TAPSE (M-mode): 1.6 cm LEFT ATRIUM             Index  RIGHT ATRIUM           Index LA diam:        3.60 cm 1.89 cm/m   RA Area:     11.60 cm LA Vol (A2C):   27.2 ml 14.28 ml/m  RA Volume:   19.90 ml  10.45 ml/m LA Vol (A4C):   35.6 ml 18.69 ml/m LA Biplane Vol: 32.0 ml 16.80 ml/m  AORTIC VALVE              PULMONIC VALVE LVOT Vmax:    157.00 cm/s  PR End Diast Vel: 3.79 msec LVOT Vmean:  100.000 cm/s LVOT VTI:    0.280 m  AORTA Ao Root diam: 3.10 cm Ao Asc diam:  3.50 cm MITRAL VALVE MV Area (PHT): 3.77 cm    SHUNTS MV Decel Time: 201 msec    Systemic VTI:  0.28 m MV E velocity: 92.90 cm/s  Systemic Diam: 2.00 cm MV A velocity: 81.80 cm/s MV E/A ratio:  1.14 Skeet Latch MD Electronically signed by Skeet Latch MD Signature Date/Time: 03/07/2022/1:38:17 PM    Final    MR BRAIN WO CONTRAST  Result Date: 03/07/2022 CLINICAL DATA:  Stroke follow-up EXAM: MRI HEAD WITHOUT CONTRAST TECHNIQUE: Multiplanar, multiecho pulse sequences of the brain and surrounding structures were obtained without intravenous contrast. COMPARISON:  Head CT and CTA from yesterday FINDINGS: Brain: Moderate to large area of restricted diffusion primarily affecting posterior frontal and parietal cortex on the right. Patchy remote bilateral cerebral infarcts, most extensive on the left in the corona radiata and on the right at the superior and posterior temporal cortex. Ischemic gliosis in the deep cerebral white matter. Generalized cerebral volume loss. No hydrocephalus or masslike finding Vascular: Grossly preserved flow voids Skull and upper cervical spine: No gross marrow lesion Sinuses/Orbits: No acute finding Other: Pervasive and significant motion artifact, pathology could easily be obscured. IMPRESSION: 1. Acute right MCA branch infarct affecting fronto-parietal cortex. 2. Multiple chronic infarcts. 3. Significant motion degradation. Electronically Signed   By: Jorje Guild M.D.   On: 03/07/2022 06:03   CT CEREBRAL PERFUSION W CONTRAST  Result Date: 03/06/2022 EXAM: CT PERFUSION BRAIN TECHNIQUE: Multiphase CT imaging of the brain was performed following IV bolus contrast injection. Subsequent parametric perfusion maps were calculated using RAPID software. RADIATION DOSE REDUCTION: This exam was performed according to the departmental dose-optimization  program which includes automated exposure control, adjustment of the mA and/or kV according to patient size and/or use of iterative reconstruction technique. CONTRAST:  44m OMNIPAQUE IOHEXOL 350 MG/ML SOLN, 437mOMNIPAQUE IOHEXOL 350 MG/ML SOLN COMPARISON:  Same day CTA and CT head. FINDINGS: CT Brain Perfusion Findings: CBF (<30%) Volume: 64m78merfusion (Tmax>6.0s) volume: 34m8msmatch Volume: 34mL39mECTS on noncontrast CT Head: 10 today. Infarct Core: 0 mL Infarction Location:None identified IMPRESSION: 1. Approximally 23 mL of penumbra in the right MCA territory, correlating with occluded vessel seen on CTA. 2. No evidence of core infarct. Findings discussed with Dr. StackQuinn Axetelephone at 3:56 p.m. Electronically Signed   By: FredeMargaretha Sheffield   On: 03/06/2022 15:58   CT ANGIO HEAD NECK W WO CM (CODE STROKE)  Result Date: 03/06/2022 CLINICAL DATA:  Neuro deficit, acute, stroke suspected L sided weakness R gaze preference EXAM: CT ANGIOGRAPHY HEAD AND NECK TECHNIQUE: Multidetector CT imaging of the head and neck was performed using the standard protocol during bolus administration of intravenous contrast. Multiplanar CT image reconstructions and MIPs were obtained to evaluate the vascular anatomy. Carotid  stenosis measurements (when applicable) are obtained utilizing NASCET criteria, using the distal internal carotid diameter as the denominator. RADIATION DOSE REDUCTION: This exam was performed according to the departmental dose-optimization program which includes automated exposure control, adjustment of the mA and/or kV according to patient size and/or use of iterative reconstruction technique. COMPARISON:  CTA Head March 29, 2021. FINDINGS: CTA NECK FINDINGS Aortic arch: Great vessel origins are patent. Right carotid system: Ulcerated atherosclerosis at the carotid bifurcation without greater than 50% stenosis. Left carotid system: Left ICA is occluded at its origin. Non opacified more distal ICA  in the neck. Vertebral arteries: Chronically occluded right vertebral artery. Skeleton: No acute findings on limited assessment. Other neck: No acute findings on limited assessment. Upper chest: Visualized lung apices are clear when accounting for expiratory changes. Review of the MIP images confirms the above findings CTA HEAD FINDINGS Anterior circulation: Chronically occluded left ICA with reconstitution at the carotid terminus. Right ICA and right M1 MCA is patent. Occluded right mid M2 MCA branch in the region of a probable calcific embolus (series 10, image 82), which appears new from prior. Bilateral ACAs are patent. Posterior circulation: Chronically clear right vertebral artery. Left vertebral artery, basilar artery and bilateral posterior cerebral arteries are patent without proximal high-grade stenosis. Left fetal type PCA. Venous sinuses: As permitted by contrast timing, patent. Review of the MIP images confirms the above findings IMPRESSION: 1. Occluded right mid M2 MCA (series 10, image 82) likely due to calcified embolus. 2. Chronically occluded right vertebral artery and left ICA. FIndings discussed with Dr. Quinn Axe at [facility] PM. Electronically Signed   By: Margaretha Sheffield M.D.   On: 03/06/2022 15:31   CT HEAD CODE STROKE WO CONTRAST  Result Date: 03/06/2022 CLINICAL DATA:  Code stroke.  Neuro deficit, acute, stroke suspected EXAM: CT HEAD WITHOUT CONTRAST TECHNIQUE: Contiguous axial images were obtained from the base of the skull through the vertex without intravenous contrast. RADIATION DOSE REDUCTION: This exam was performed according to the departmental dose-optimization program which includes automated exposure control, adjustment of the mA and/or kV according to patient size and/or use of iterative reconstruction technique. COMPARISON:  CT head March 29, 2021. FINDINGS: Brain: Similar remote infarcts in the left anterior temporal lobe, left MCA territory and right temporal lobe. No  evidence of acute large vascular territory infarct, acute hemorrhage, mass lesion, midline shift or hydrocephalus. Vascular: No hyperdense vessel identified. Calcific atherosclerosis. Skull: No acute fracture. Sinuses/Orbits: Clear sinuses.  No acute orbital findings. Other: No mastoid effusions. ASPECTS [facility] Stroke Program Early CT Score) total score (0-10 with 10 being normal): 10. IMPRESSION: 1. No evidence of acute intracranial abnormality.  ASPECTS 10. 2. Similar remote infarcts, as above. Code stroke imaging results were communicated on 03/06/2022 at 3:19 pm to provider [facility] via secure text paging. Electronically Signed   By: Margaretha Sheffield M.D.   On: 03/06/2022 15:19    PHYSICAL EXAM  Temp:  [97.3 F (36.3 C)-99 F (37.2 C)] 98.5 F (36.9 C) (01/22 1231) Pulse Rate:  [61-99] 87 (01/22 1231) Resp:  [13-27] 17 (01/22 1231) BP: (103-176)/(55-110) 158/105 (01/22 1231) SpO2:  [83 %-100 %] 99 % (01/22 1231) Arterial Line BP: (124-345)/(57-341) 156/77 (01/22 1000) FiO2 (%):  [28 %] 28 % (01/22 0000)  General - Well nourished, well developed, in no apparent distress.  Cardiovascular - SR with 1st degree AVB seen on monitor and on EKG.  Pulmonary: unlabored, RA.   Mental Status -  Level of arousal and orientation  to time, place, and person were intact Oriented to place, age, month, year. naming and repetition intact.  Dysarthria and drooling present (history of partial glossectomy 2016 d/t malignant neoplasm).  Cranial Nerves II - XII - II - Decreased left eye vision when all fields are tested;  III, IV, VI - Extraocular movements intact. V - Facial sensation intact bilaterally. VII - Facial movement intact bilaterally. VIII - HOH at baseline X - Palate elevates symmetrically. XI - Chin turning & shoulder shrug intact bilaterally. XII - Tongue protrusion intact.  Motor Strength -  Right upper and lower extremities elevate antigravity with no drift. Weakness noted to left side,  improved from noted assessments yesterday.  Sensory - Decreased sensation to LUE, LLE.   Coordination - Decreased fine motor LUE.  Tremor was absent.  Gait and Station - deferred.   ASSESSMENT/PLAN NIRVAAN FRETT is a 77 y.o. 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 Patient lives with his cousin and POA, [name] who last saw patient in his usual state of health at 01:30 this morning. [name] went to church this morning and returned home at 13:30 finding Mr. Spiller with left-sided weakness and slurred speech. Patient consistently reports the symptoms acutely onset at 13:00 and notified [name] that he was having a stroke prompting emergency service activation. On arrival patient was found to have a right gaze preference, left arm weakness > left leg weakness, slurred speech, and left homonymous hemianopia and patient was taken for CT imaging. CBG on arrival 105, blood pressure 138/80.    LKW: 13:00 03/06/22, with onset of acute symptoms per patient TNK given?: no, patient adamant that he is on blood thinners and unable to identify what medications he takes at home. Patient's POA also states that the patient is on blood thinners, aspirin, and fish oil but unable to clarify all of his home medications.  IR ? Yes, patient with NIHSS of 16 on arrival. CTA with occluded right M2 MCA likely due to calcified embolus. CTP with 0 core and 23 mL  penumbra. Patient was taken to IR for mechanical thrombectomy. He was found to have a calcified embolus, larger proximally than distally, with no way to get around it. The risk of attempting thrombectomy in this situation was deemed to be high risk of vascular rupture and  abort the procedure. Aspirin, plavix, and DVT prophylaxis scheduled to start tomorrow. BP goal 140-160 per IR.     Stroke: acute Right MCA infarct due to right M2 with calcified embolus etiology due to right ICA atherosclerosis   Code Stroke CT head No acute abnormality. ASPECTS 10.    CTA head & neck Occluded right mid M2 MCA likely due to calcified embolus. Chronically occluded right vertebral artery and left ICA. CT perfusion 0/23 Cerebral angio Occluded  M3 branch of the superior division of the right middle cerebral artery in the perisylvian triangle with modest  distal collateral reconstitution. Occluded left internal carotid artery proximally. MRI  Acute right MCA  branch infarct affecting fronto-parietal cortex. Multiple chronic infarcts 2D Echo: LVEF 60-65% LDL 36 HgbA1c 5.7 UDS negative VTE prophylaxis - SCDs No antithrombotic prior to admission, now on aspirin 81 mg daily and clopidogrel 75 mg daily. Pt seems interested in [place] trial.  Therapy recommendations:  home health PT/OT Disposition:  pending  Carotid stenosis/occlusion CT head and neck chronic left ICA occlusion and right ICA atherosclerosis No intervention needed given chronic occlusion  History of stroke 11/2014 admitted for angiogram for left ICA stenosis.  After procedure patient developed aphasia.  MRI showed right MCA and tiny left occipital infarcts.  Stroke likely related to procedure.  Angiogram showed left ICA/CCA 60 to 70% stenosis.  CTA neck showed left ICA 75 to 80% stenosis.  Carotid Doppler left ICA 60 to 79% stenosis.  EF 55 to 60%.  LDL 44.  Patient discharged on DAPT and Lipitor 80. 03/2021 patient admitted for left CRAO with partial  improvement.  CTA head and neck showed left ICA occluded.  Right VA occlusion.  Bilateral P2 stenosis.  MRI showed no acute infarct.  EF 60 to 65%.  LDL 47, A1c 5.8.  Again discharged on DAPT and Lipitor 80.  Hypertension Home meds:  none DC A-line Off Cleviprex BP goal less than 180/105 Long-term BP goal 130-150 given left ICA chronic occlusion  Hyperlipidemia Home meds: Lipitor 80 LDL 36, goal < 70 Continue Lipitor 80 Continue statin on discharge  Other Stroke Risk Factors Advanced Age >/= 45  Former Cigarette smoker Obesity, Body mass index is 30.45 kg/m., BMI >/= 30 associated with increased stroke risk, recommend weight loss, diet and exercise as appropriate  Coronary artery disease CHF PVD  Other Active Problems Laryngeal/tongue cancer status post chemotherapy, radiation and partial glossectomy RLS Pulmonary fibrosis with chronic hypoxia Leukocytosis 7.4 -> 10.8.  No accompanying fevers, will monitor.   Hospital day # 1  Pt seen by Neuro NP/APP and later by MD. Note/plan to be edited by MD as needed.    Otelia Santee, DNP, AGACNP-BC [facility] Please use AMION for pager and EPIC for messaging  ATTENDING NOTE: I reviewed above note and agree with the assessment and plan. Pt was seen and examined.   77 year old male with history of hypertension, hyperlipidemia, PVD, CHF, known left carotid occlusion, Laryngeal/tongue cancer status post chemotherapy, radiation and partial glossectomy, stroke, left CRAO admitted for left-sided weakness, slurred speech, right gaze and left hemianopia.  CT no acute abnormality but right M2 region calcified embolus.  CT head and neck right M2 occlusion due to calcified plaque, chronic left ICA occlusion and right VA occlusion.  CTP 0/23.  MRI showed right MCA patchy infarct.  IR again confirmed right M3 calcified embolus but no intervention performed.  EF 60 to 65%.  LDL 36, A1c 5.7, UDS negative.  Creatinine 0.85.  WBC  10.8.  On exam, cousin and granddaughter are at the bedside, pt sitting in chair, awake, alert, eyes open, orientated to age, place, time. No aphasia, fluent language, following all simple commands but severe dysarthria (per pt it is chronic due to laryngeal and tongue cancer s/p partial glossectomy). Able to name and repeat. No gaze palsy, tracking bilaterally, visual field full but with left simultagnosia. No obvious facial droop. Tongue not able to protrude due to partial glossectomy. Bilateral UEs 5/5, no drift. Bilaterally LEs 5/5, no drift. Sensation symmetrical bilaterally but with left sensory simultagnosia, b/l FTN intact, gait not tested.   Etiology of patient's stroke likely due to  right ICA atherosclerosis with calcified embolus.  Patient recovers well, currently moving all extremities.  On aspirin and Plavix.  BP management with goal as 180/105, DC A-line and off Cleviprex.  Continue Lipitor 80.  PT/OT recommend home health.  Discussed with patient regarding Librex[place] trial and he seems interested.  For detailed assessment and plan, please refer to above/below as I have made changes wherever appropriate.   Rosalin Hawking, MD PhD Stroke Neurology 03/07/2022 6:33 PM  This patient is critically ill due to right MCA stroke, right M2 occlusion, chronic left ICA occlusion and at significant risk of neurological worsening, death form recurrent stroke, hemorrhagic transformation. This patient's care requires constant monitoring of vital signs, hemodynamics, respiratory and cardiac monitoring, review of multiple databases, neurological assessment, discussion with family, other specialists and medical decision making of high complexity. I spent 35 minutes of neurocritical care time in the care of this patient. I had long discussion with patient, his cousin and granddaughter at bedside, updated pt current condition, treatment plan and potential prognosis, and answered all the questions.  They expressed  understanding and appreciation.      To contact Stroke Continuity provider, please refer to [email or web address] After hours, contact General Neurology

## 2022-03-07 NOTE — Evaluation (Signed)
Occupational Therapy Evaluation Patient Details Name: James Richards MRN: [id number] DOB: 1945-08-06 Today's Date: 03/07/2022   History of Present Illness James Richards is a 77 y.o. male  who presented to the ED 03/06/22 via EMS from home for acute onset of left-sided weakness and slurred speech. IR procedure 1/21. MRI + Acute right MCA branch infarct affecting fronto-parietal cortex.  PMH: HTN, chronic left carotid stenosis and chronic occlusion of right vertebral artery, recurrent TIAs, CVAs including CRAO in February of 2023, amaurosis fugax, tobacco use, malignant neoplasm of tongue and larynx s/p chemo and radiation and right neck lymph node dissection 2009 with recurrence in 2016 s/p partial glossectomy, mixed hyperlipidemia, PVD, chronic diastolic heart failure, idiopathic pulmonary fibrosis with chronic hypoxia, and RLS   Clinical Impression   PTA pt lives independently with his cousin. Pt presents with apparent sensory motor deficits LUE and L inattention as noted below, requiring min A with mobility and ADL. Recommend follow up with [facility] and refraining from driving at this time. Acute OT to follow to facilitate safe DC home.      Recommendations for follow up therapy are one component of a multi-disciplinary discharge planning process, led by the attending physician.  Recommendations may be updated based on patient status, additional functional criteria and insurance authorization.   Follow Up Recommendations  Home health OT (refrain form driving; follow up with his eye doctor for full field visual assessment)     Assistance Recommended at Discharge Frequent or constant Supervision/Assistance  Patient can return home with the following A little help with walking and/or transfers;A little help with bathing/dressing/bathroom;Assistance with cooking/housework;Direct supervision/assist for medications management;Direct supervision/assist for financial management;Assist for transportation;Help  with stairs or ramp for entrance    Functional Status Assessment  Patient has had a recent decline in their functional status and demonstrates the ability to make significant improvements in function in a reasonable and predictable amount of time.  Equipment Recommendations       Recommendations for Other Services       Precautions / Restrictions Precautions Precautions: Fall Precaution Comments: L inattention Restrictions Weight Bearing Restrictions: No      Mobility Bed Mobility               General bed mobility comments: OOB in chair    Transfers Overall transfer level: Needs assistance Equipment used: 1 person hand held assist Transfers: Sit to/from Stand Sit to Stand: Min assist                 Balance Overall balance assessment: Needs assistance Sitting-balance support: Feet supported, No upper extremity supported Sitting balance-Leahy Scale: Fair (L bias)     Standing balance support: During functional activity, Single extremity supported, Reliant on assistive device for balance Standing balance-Leahy Scale: Fair Standing balance comment: reliant on external support for safe ambulation                           ADL either performed or assessed with clinical judgement   ADL Overall ADL's : Needs assistance/impaired Eating/Feeding: Set up;Supervision/ safety   Grooming: Minimal assistance   Upper Body Bathing: Minimal assistance   Lower Body Bathing: Minimal assistance;Sit to/from stand   Upper Body Dressing : Minimal assistance   Lower Body Dressing: Minimal assistance   Toilet Transfer: Minimal assistance;Ambulation   Toileting- Clothing Manipulation and Hygiene: Minimal assistance       Functional mobility during ADLs: Minimal assistance  Vision Patient Visual Report: Other (comment) (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     Perception Perception Perception Tested?: Yes Perception Deficits: Inattention/neglect Comments: unable to complete hands on clock draw; not identifying targets on L with double simulataneous stimulation however identified when only L target presented; dropping cloth, items out of L hand   Praxis      Pertinent Vitals/Pain Pain Assessment Pain Assessment: Faces Faces Pain Scale: Hurts little more Pain Location: R shld/UE Pain Descriptors / Indicators: Discomfort Pain Intervention(s): Limited activity within patient's tolerance     Hand Dominance Right   Extremity/Trunk Assessment Upper Extremity Assessment Upper Extremity Assessment: LUE deficits/detail LUE Deficits / Details: AROM overall WFL; apparent sensory motor impariment; going through movements with LUE to wipe his face however unaware he had dropped the cloth; decreased in-hand manipulation skills; "clumsy hand" LUE Sensation: decreased light touch;decreased proprioception LUE Coordination: decreased fine motor   Lower Extremity Assessment Lower Extremity Assessment: Defer to PT evaluation   Cervical / Trunk Assessment Cervical / Trunk Assessment: Normal   Communication Communication Communication: Expressive difficulties;Other (comment);HOH (dysarthric due to partial tongue resection)   Cognition Arousal/Alertness: Awake/alert Behavior During Therapy: WFL for tasks assessed/performed, Impulsive Overall Cognitive Status: No family/caregiver present to determine baseline cognitive functioning Area of Impairment: Safety/judgement, Awareness, Attention, Problem solving                   Current Attention Level: Selective     Safety/Judgement: Decreased awareness of safety, Decreased awareness of  deficits Awareness: Emergent Problem Solving: Slow processing General Comments: impaired safety awareness; very HOH impacting his performance; poor awareness of lines; before leaving, cousin states his "thinking is better but not normal"; will continue ot assess     General Comments  pt with excessive bleeding from IV in L elbow, RN came to address. VSS on RA    Exercises     Shoulder Instructions      Home Living Family/patient expects to be discharged to:: Private residence Living Arrangements: Other (Comment) (cousin) Available Help at Discharge: Family;Available 24 hours/day Type of Home: House Home Access: Stairs to enter [employer] of Steps: 2 Entrance Stairs-Rails:  (no rail but a post) Home Layout: Laundry or work area in basement     [employer] Shower/Tub: Occupational psychologist: Standard Bathroom Accessibility: Yes How Accessible: Accessible via walker Home Equipment: Conservation officer, nature (2 wheels);Cane - single point   Additional Comments: lives with cousin  Lives With: Family    Prior Functioning/Environment Prior Level of Function : Independent/Modified Independent             Mobility Comments: no AD ADLs Comments: drives short distances, able to make meals for self        OT Problem List: Decreased strength;Decreased activity tolerance;Impaired balance (sitting and/or standing);Impaired vision/perception;Decreased coordination;Decreased cognition;Decreased safety awareness;Decreased knowledge of use of DME or AE;Impaired sensation;Impaired UE functional use      OT Treatment/Interventions: Self-care/ADL training;Therapeutic exercise;Neuromuscular education;DME and/or AE instruction;Therapeutic activities;Cognitive remediation/compensation;Visual/perceptual remediation/compensation;Patient/family education;Balance training    OT Goals(Current goals can be found in the care plan section) Acute Rehab OT Goals Patient Stated Goal: to go  home OT Goal Formulation: With patient Time For  Goal Achievement: 03/21/22 Potential to Achieve Goals: Good  OT Frequency: Min 2X/week    Co-evaluation              AM-PAC OT "6 Clicks" Daily Activity     Outcome Measure Help from another person eating meals?: A Little Help from another person taking care of personal grooming?: A Little Help from another person toileting, which includes using toliet, bedpan, or urinal?: A Little Help from another person bathing (including washing, rinsing, drying)?: A Little Help from another person to put on and taking off regular upper body clothing?: A Little Help from another person to put on and taking off regular lower body clothing?: A Little 6 Click Score: 18   End of Session Equipment Utilized During Treatment: Gait belt Nurse Communication: Mobility status  Activity Tolerance: Patient tolerated treatment well Patient left: Other (comment) (in transport chair with nsg)  OT Visit Diagnosis: Unsteadiness on feet (R26.81);Other abnormalities of gait and mobility (R26.89);Muscle weakness (generalized) (M62.81);Low vision, both eyes (H54.2);Other symptoms and signs involving cognitive function                Time: 6789-3810 OT Time Calculation (min): 23 min Charges:  OT General Charges $OT Visit: 1 Visit OT Evaluation $OT Eval Moderate Complexity: 1 Mod OT Treatments $Self Care/Home Management : 8-22 mins  Maurie Boettcher, OT/L   Acute OT Clinical Specialist Acute Rehabilitation Services Pager [phone] Office [phone]   [facility] 03/07/2022, 12:51 PM

## 2022-03-07 NOTE — Research (Signed)
PATIENT: James Richards DOB: Sep 27, 1945  James Richards is a 77 y.o. male who meets inclusion and exclusion criteria and may be a potential candidate for [place] study.  A brief description of the study's purpose and duration was provided around 10am to see if the patient was interested in participating. The patient was told that study participation is voluntary, and if he choose not to participate, he will continue receiving the same treatment as any other patient. He has his cousin and granddaughter with him and they will discuss more about the study  Later, pt and his cousin expressed interest in the trial, a copy of the informed consent was provided to him at 1pm today and I said I would come back later to discuss the study in more detail after he had time to read the informed consent. He was alone in the room at that time.  Detailed discussion  I returned at 2pm today to discuss the study in detail with him.  I requested him ask any questions he may have as we reviewed the informed consent so I could answer them as we went along. I discussed the standard-of-care treatments, appropriate alternative treatments, procedures or devices that might be helpful to the patients, the clinical trial, and each option's relative risks and benefits and alternatives. We discussed the research participant's bill of rights. Then, we also reviewed the ICF page by page and discussed their responsibilities if they choose to participate, any compensation, and what medical treatment is available if injury occurs. James Richards was encouraged to discuss study participation with family, significant others, and primary care attending or physician to help reduce the possibility of undue influence by talking to the investigator alone.  The patient was reminded that the study participation is voluntary, and if he choose not to participate, he will continue receiving the same care as any other patient. Additionally, if he choose to  participate and later decide he want to withdraw, he will continue to be treated as any other patient. James Richards was told that he did not have to decide now.  Tresa Endo answered questions appropriately to verbalize understanding of the informed consent.   The subject agreed to participate in the [place] trial and signed the Research Participants [employer] of Rights and the informed consent at 2:25pm today.  The informed consent was obtained prior to performance of any protocol-specific procedures for the subject.  A copy of the Research Participants [employer] of Rights and signed informed consent was given to the subject, and a copy placed in the subject's medical record.   Rosalin Hawking, MD PhD Stroke Neurology 03/07/2022 5:22 PM

## 2022-03-07 NOTE — Evaluation (Signed)
Speech Language Pathology Evaluation Patient Details Name: James Richards MRN: [id number] DOB: Sep 25, 1945 Today's Date: 03/07/2022 Time: 1000-1025 SLP Time Calculation (min) (ACUTE ONLY): 25 min  Problem List:  Patient Active Problem List   Diagnosis Date Noted   Left-sided weakness 03/06/2022   Essential hypertension 03/06/2022   Middle cerebral artery embolism, left 03/06/2022   Hearing loss of both ears due to cerumen impaction 08/24/2021   Otitis media, chronic with perforation 07/27/2021   Otitis media, purulent, acute, with spontaneous rupture of TM 07/27/2021   Central retinal artery occlusion of left eye 03/31/2021   Occlusion of extracranial carotid artery, left 03/31/2021   Hypertensive heart disease with chronic diastolic congestive heart failure ([place]) 06/23/2020   Mixed hearing loss 15/40/0867   Diastolic dysfunction 61/95/0932   Restrictive lung disease 11/26/2019   BMI 30.0-30.9,adult 07/07/2019   Other specified hypothyroidism    Hyperlipidemia    Restless legs syndrome    History of TIA (transient ischemic attack) 12/11/2014   Carotid stenosis 11/24/2014   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   Migraine 04/02/2014   Atherosclerosis of native artery of extremity with intermittent claudication ([place]) 10/09/2013   Past Medical History:  Past Medical History:  Diagnosis Date   Atherosclerosis of native arteries of the extremities with intermittent claudication 10/09/2013   Atherosclerotic peripheral vascular disease with intermittent claudication ([place]) 10/03/2012   BMI 33.0-33.9,adult 07/07/2019   Carotid stenosis 67/01/4579   Diastolic dysfunction 99/83/3825   Essential hypertension    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   History of recurrent TIAs 12/11/2014   Impacted cerumen 11/26/2019   Malignant neoplasm of base of tongue ([place]) 01/21/2015   Malignant neoplasm of larynx, unspecified ([facility])    Migraine 04/02/2014   Mixed hyperlipidemia     Other fatigue    Other specified hypothyroidism    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    Restless legs syndrome    Restrictive lung disease 11/26/2019   TIA (transient ischemic attack) 11/24/2014   Vision exam with abnormal findings concern for left retinal artery occlusion 03/29/2021   Past Surgical History:  Past Surgical History:  Procedure Laterality Date   LYMPHADENECTOMY     PERIPHERAL VASCULAR CATHETERIZATION N/A 11/24/2014   Procedure: Aortic Arch Angiography;  Surgeon: Angelia Mould, MD;  Location: [place] [facility];  Service: Cardiovascular;  Laterality: N/A;   PERIPHERAL VASCULAR CATHETERIZATION N/A 11/24/2014   Procedure: Cerebral Angiography;  Surgeon: Angelia Mould, MD;  Location: [place] [facility];  Service: Cardiovascular;  Laterality: N/A;   THROAT SURGERY     TONSILECTOMY/ADENOIDECTOMY WITH MYRINGOTOMY     HPI:  77yo male admitted 03/06/22 with left side weakness and slurred speech. PMH: HTN, chronic left carotid stenosis, chronic occlusion of right vertebral artery, recurrent TIAs, CVA, CRAO (03/2021), amaurosis fugax, tobacco use, malignant neoplasm of tongue and larynx s/p radiation and right lymph node dissection in 2009 with recurrence in 2016 s/p partial glossectomy, mixed hyperlipidemia, PVD, chronic diastolic heart failure, idiopathic pulmonary fibrosis with chronic hypoxia, and RLS. MRI = Acute right MCA branch infarct affecting fronto-parietal cortex. Multiple chronic infarcts.   Assessment / Plan / Recommendation Clinical Impression  Pt was seen at bedside for assessment of cognitive linguistic function. Pt was awake, pleasant and cooperative. Pt reported having a high school education. Pt exhibited intermittent drooling, and did not seem to be aware of it. Speech intelligibility was markedly affected by partial glossectomy and radiation. Pt was receptive to cues to decrease rate  of speech for only a few minutes at a  time. The [place] Mental Status (SLUMS) Exam was administered today. Over all, pt scored 17/30, indicating significant neurocognitive impairment. Pt exhibited difficulty with mental math, immediate and delayed recall, digit reversal, clock drawing. and auditory attention and recall. In addition, pt is significantly hard of hearing. Pt would benefit from continued speech therapy services short term for education on maximizing speech intelligibility, and managing oral secretions.    SLP Assessment  SLP Recommendation/Assessment: Patient needs continued Speech Language Pathology Services  SLP Visit Diagnosis: Cognitive communication deficit (R41.841);Dysarthria and anarthria (R47.1)    Recommendations for follow up therapy are one component of a multi-disciplinary discharge planning process, led by the attending physician.  Recommendations may be updated based on patient status, additional functional criteria and insurance authorization.    Follow Up Recommendations  Follow physician's recommendations for discharge plan and follow up therapies    Assistance Recommended at Discharge  Frequent or constant Supervision/Assistance  Functional Status Assessment Patient has had a recent decline in their functional status and demonstrates the ability to make significant improvements in function in a reasonable and predictable amount of time.  Frequency and Duration min 1 x/week  2 weeks      SLP Evaluation Cognition  Overall Cognitive Status: No family/caregiver present to determine baseline cognitive functioning Arousal/Alertness: Awake/alert Orientation Level: Oriented X4       Comprehension  Auditory Comprehension Overall Auditory Comprehension: Appears within functional limits for tasks assessed    Expression Expression Primary Mode of Expression: Verbal Verbal Expression Overall Verbal Expression: Appears within functional limits for tasks assessed Written Expression Dominant Hand:  Right   Oral / Motor  Oral Motor/Sensory Function Overall Oral Motor/Sensory Function: Generalized oral weakness (mod-severe impairment.) Facial ROM: Within Functional Limits Facial Symmetry: Within Functional Limits Facial Strength: Within Functional Limits Facial Sensation: Within Functional Limits Lingual ROM:  (reduced due to partial glossectomy) Lingual Symmetry:  (post-operative changes) Lingual Strength: Reduced Lingual Sensation: Reduced Mandible: Within Functional Limits Motor Speech Overall Motor Speech: Impaired at baseline Respiration: Within functional limits Phonation: Normal Resonance: Within functional limits Articulation: Impaired Level of Impairment: Phrase Intelligibility: Intelligibility reduced Word: 75-100% accurate Phrase: 75-100% accurate Sentence: 50-74% accurate Conversation: 50-74% accurate Motor Speech Errors: Aware Effective Techniques: Slow rate           Onetta Spainhower B. Quentin Ore, [facility], [place] Speech Language Pathologist Office: [phone]  Shonna Chock 03/07/2022, 1:19 PM

## 2022-03-07 NOTE — Evaluation (Signed)
Clinical/Bedside Swallow Evaluation Patient Details  Name: James Richards MRN: [id number] Date of Birth: 02-Jun-1945  Today's Date: 03/07/2022 Time: SLP Start Time (ACUTE ONLY): 2229 SLP Stop Time (ACUTE ONLY): 1351 SLP Time Calculation (min) (ACUTE ONLY): 14 min  Past Medical History:  Past Medical History:  Diagnosis Date   Atherosclerosis of native arteries of the extremities with intermittent claudication 10/09/2013   Atherosclerotic peripheral vascular disease with intermittent claudication ([place]) 10/03/2012   BMI 33.0-33.9,adult 07/07/2019   Carotid stenosis 79/89/2119   Diastolic dysfunction 41/74/0814   Essential hypertension    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   History of recurrent TIAs 12/11/2014   Impacted cerumen 11/26/2019   Malignant neoplasm of base of tongue ([place]) 01/21/2015   Malignant neoplasm of larynx, unspecified ([facility])    Migraine 04/02/2014   Mixed hyperlipidemia    Other fatigue    Other specified hypothyroidism    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    Restless legs syndrome    Restrictive lung disease 11/26/2019   TIA (transient ischemic attack) 11/24/2014   Vision exam with abnormal findings concern for left retinal artery occlusion 03/29/2021   Past Surgical History:  Past Surgical History:  Procedure Laterality Date   LYMPHADENECTOMY     PERIPHERAL VASCULAR CATHETERIZATION N/A 11/24/2014   Procedure: Aortic Arch Angiography;  Surgeon: Angelia Mould, MD;  Location: [facility];  Service: Cardiovascular;  Laterality: N/A;   PERIPHERAL VASCULAR CATHETERIZATION N/A 11/24/2014   Procedure: Cerebral Angiography;  Surgeon: Angelia Mould, MD;  Location: [facility];  Service: Cardiovascular;  Laterality: N/A;   RADIOLOGY WITH ANESTHESIA N/A 03/06/2022   Procedure: RADIOLOGY WITH ANESTHESIA;  Surgeon: Radiologist, Medication, MD;  Location: [facility];  Service: Radiology;  Laterality: N/A;   THROAT  SURGERY     TONSILECTOMY/ADENOIDECTOMY WITH MYRINGOTOMY     HPI:  James Richards is a 77 y.o. male  who presented to the ED 03/06/22 via EMS from home for acute onset of left-sided weakness and slurred speech. IR procedure 1/21. MRI + Acute right MCA branch infarct affecting fronto-parietal cortex.  PMH: malignant neoplasm of tongue and larynx s/p chemo and radiation and right neck lymph node dissection 2009 with recurrence in 2016 s/p partial glossectomy, HTN, chronic left carotid stenosis and chronic occlusion of right vertebral artery, recurrent TIAs, CVAs including CRAO in February of 2023, amaurosis fugax, tobacco use,, mixed hyperlipidemia, PVD, chronic diastolic heart failure, idiopathic pulmonary fibrosis with chronic hypoxia, and RLS    Assessment / Plan / Recommendation  Clinical Impression  Pt has decreased lingual ROM with imprecision bilaterally from lingual resection due to history of remote lingual cancer. His dentures do not fit without cream and therapist asked unit secretary to order denture cream, however, he was able to [facility] regular timely and efficiently. He reports typically wearing teeth when eating but "doesn't have to." There were several minimal delayed throat clears but no coughs or concerns of aspiration with thin liquids via cup or straw consistently. He was impulsive with eating and needed cues to slow his rate. Recommend Dys 3 texture (use dentures with cream once arrives), thin liquids, pills with thin (place whole in puree if difficulty taking with thin liquid). Sit upright and slow rate. ST will follow up for ability to advance to regular texture. SLP Visit Diagnosis: Dysphagia, unspecified (R13.10)    Aspiration Risk  Mild aspiration risk    Diet Recommendation Dysphagia 3 (Mech soft);Thin liquid   Liquid  Administration via: Cup;Straw Medication Administration: Whole meds with puree Supervision: Patient able to self feed;Full supervision/cueing for compensatory  strategies Compensations: Minimize environmental distractions;Slow rate;Small sips/bites Postural Changes: Seated upright at 90 degrees    Other  Recommendations Oral Care Recommendations: Oral care BID    Recommendations for follow up therapy are one component of a multi-disciplinary discharge planning process, led by the attending physician.  Recommendations may be updated based on patient status, additional functional criteria and insurance authorization.  Follow up Recommendations  (TBD)      Assistance Recommended at Discharge Frequent or constant Supervision/Assistance  Functional Status Assessment Patient has had a recent decline in their functional status and demonstrates the ability to make significant improvements in function in a reasonable and predictable amount of time.  Frequency and Duration min 1 x/week  2 weeks       Prognosis Prognosis for Safe Diet Advancement: Good      Swallow Study   General Date of Onset: 03/06/22 HPI: James Richards is a 77 y.o. male  who presented to the ED 03/06/22 via EMS from home for acute onset of left-sided weakness and slurred speech. IR procedure 1/21. MRI + Acute right MCA branch infarct affecting fronto-parietal cortex.  PMH: malignant neoplasm of tongue and larynx s/p chemo and radiation and right neck lymph node dissection 2009 with recurrence in 2016 s/p partial glossectomy, HTN, chronic left carotid stenosis and chronic occlusion of right vertebral artery, recurrent TIAs, CVAs including CRAO in February of 2023, amaurosis fugax, tobacco use,, mixed hyperlipidemia, PVD, chronic diastolic heart failure, idiopathic pulmonary fibrosis with chronic hypoxia, and RLS Type of Study: Bedside Swallow Evaluation Previous Swallow Assessment:  (none) Diet Prior to this Study: NPO (pt states eats regular texture at home) Temperature Spikes Noted: No Respiratory Status: Room air History of Recent Intubation: No Behavior/Cognition:  Alert;Cooperative;Pleasant mood;Impulsive Oral Cavity Assessment: Dry Oral Care Completed by SLP: No Oral Cavity - Dentition: Dentures, bottom;Dentures, top (now ill fitting without denture cream) Vision: Functional for self-feeding Self-Feeding Abilities: Able to feed self Patient Positioning: Upright in bed Baseline Vocal Quality: Normal Volitional Cough: Strong Volitional Swallow: Able to elicit    Oral/Motor/Sensory Function Overall Oral Motor/Sensory Function: Moderate impairment Facial ROM: Within Functional Limits Facial Symmetry: Within Functional Limits Facial Strength: Within Functional Limits Facial Sensation: Within Functional Limits Lingual ROM: Reduced right;Reduced left Lingual Symmetry: Abnormal symmetry right Lingual Strength: Reduced Lingual Sensation: Reduced Velum: Within Functional Limits Mandible: Within Functional Limits   Ice Chips Ice chips: Not tested   Thin Liquid Thin Liquid: Impaired Presentation: Cup;Straw Pharyngeal  Phase Impairments: Throat Clearing - Delayed    Nectar Thick Nectar Thick Liquid: Not tested   Honey Thick Honey Thick Liquid: Not tested   Puree Puree: Within functional limits   Solid     Solid: Within functional limits      Mick Sell, [name] 03/07/2022,2:17 PM

## 2022-03-08 ENCOUNTER — Encounter (HOSPITAL_COMMUNITY): Payer: Self-pay | Admitting: Neurology

## 2022-03-08 ENCOUNTER — Other Ambulatory Visit: Payer: Self-pay

## 2022-03-08 DIAGNOSIS — I6602 Occlusion and stenosis of left middle cerebral artery: Secondary | ICD-10-CM | POA: Diagnosis not present

## 2022-03-08 DIAGNOSIS — I639 Cerebral infarction, unspecified: Secondary | ICD-10-CM | POA: Diagnosis not present

## 2022-03-08 LAB — BASIC METABOLIC PANEL
Anion gap: 6 (ref 5–15)
BUN: 13 mg/dL (ref 8–23)
CO2: 26 mmol/L (ref 22–32)
Calcium: 8.8 mg/dL — ABNORMAL LOW (ref 8.9–10.3)
Chloride: 106 mmol/L (ref 98–111)
Creatinine, Ser: 0.93 mg/dL (ref 0.61–1.24)
GFR, Estimated: >60 mL/min (ref >60)
Glucose, Bld: 89 mg/dL (ref 70–99)
Potassium: 3.8 mmol/L (ref 3.5–5.1)
Sodium: 138 mmol/L (ref 135–145)

## 2022-03-08 MED ORDER — STUDY - LIBREXIA-STROKE - MILVEXIAN 25 MG OR PLACEBO TABLET (PI-SETHI): 1.0000 | ORAL_TABLET | Freq: Two times a day (BID) | ORAL | 0 refills | Status: DC

## 2022-03-08 MED ORDER — PANTOPRAZOLE SODIUM 40 MG PO TBEC: 40.0000 mg | DELAYED_RELEASE_TABLET | Freq: Every day | ORAL | 3 refills | Status: DC

## 2022-03-08 MED ORDER — PANTOPRAZOLE SODIUM 40 MG PO TBEC: 40.0000 mg | DELAYED_RELEASE_TABLET | Freq: Every day | ORAL | Status: DC

## 2022-03-08 NOTE — Discharge Summary (Addendum)
Stroke Discharge Summary  Patient ID: James Richards   MRN: [id number]      DOB: Aug 16, 1945  Date of Admission: 03/06/2022 Date of Discharge: 03/08/2022  Attending Physician:  Stroke, Md, MD, Stroke MD Consultant(s):    None  Patient's PCP:  Rip Harbour, NP  DISCHARGE DIAGNOSIS:  Stroke: acute Right MCA infarct due to right M2 with calcified embolus etiology due to right ICA atherosclerosis    Active Problems: Carotid stenosis/occlusion History of stroke Hypertension Hyperlipidemia Former smoker Obesity CAD PVD   Allergies as of 03/08/2022   No Known Allergies      Medication List     STOP taking these medications    omeprazole 40 MG capsule Commonly known as: PRILOSEC       TAKE these medications    acetaminophen-codeine 300-30 MG tablet Commonly known as: TYLENOL #3 TAKE 1 TABLET EVERY 4 HOURS AS NEEDED FOR MODERATE PAIN What changed: See the new instructions.   albuterol 108 (90 Base) MCG/ACT inhaler Commonly known as: VENTOLIN HFA Inhale 2 puffs into the lungs every 6 (six) hours as needed for wheezing or shortness of breath.   ascorbic acid 500 MG tablet Commonly known as: VITAMIN C Take 500 mg by mouth daily.   aspirin EC 81 MG tablet Take 81 mg by mouth daily.   atorvastatin 80 MG tablet Commonly known as: LIPITOR TAKE 1 TABLET EVERY DAY   CINNAMON PO Take 1 capsule by mouth daily.   clopidogrel 75 MG tablet Commonly known as: PLAVIX Take 1 tablet (75 mg total) by mouth daily.   Fish Oil 1200 MG Cpdr Take 1 capsule by mouth daily.   furosemide 40 MG tablet Commonly known as: LASIX Take 1 tablet (40 mg total) by mouth every other day. What changed:  when to take this reasons to take this   GARLIC PO Take 224 mg by mouth daily.   GINKOBA PO Take 120 mg by mouth daily.   levothyroxine 125 MCG tablet Commonly known as: SYNTHROID Take 1 tablet (125 mcg total) by mouth daily.   LIBREXIA-STROKE milvexian or placebo 25 mg  tablet Take 1 tablet by mouth 2 (two) times daily. For Investigational Use Only. Take with or without food at approximately the same time each day.   losartan 50 MG tablet Commonly known as: COZAAR Take 1 tablet (50 mg total) by mouth daily.   minocycline 50 MG capsule Commonly known as: MINOCIN Take 1 capsule (50 mg total) by mouth daily. What changed: when to take this   pantoprazole 40 MG tablet Commonly known as: PROTONIX Take 1 tablet (40 mg total) by mouth daily.   Potassium Bicarbonate 99 MG Caps Take 99 mg by mouth daily.   rOPINIRole 2 MG tablet Commonly known as: REQUIP Take 2 mg by mouth at bedtime as needed (restless legs).   Symbicort 160-4.5 MCG/ACT inhaler Generic drug: budesonide-formoterol Inhale 2 puffs into the lungs daily.   vitamin B-12 500 MCG tablet Commonly known as: CYANOCOBALAMIN Take 500 mcg by mouth daily.   zinc gluconate 50 MG tablet Take 50 mg by mouth daily.               Durable Medical Equipment  (From admission, onward)           Start     Ordered   03/08/22 1335  For home use only DME Walker rolling  Once       Question Answer Comment  Walker: With  [place]   Patient needs a walker to treat with the following condition Weakness      03/08/22 1334            LABORATORY STUDIES CBC    Component Value Date/Time   WBC 10.8 (H) 03/07/2022 0454   RBC 4.26 03/07/2022 0454   HGB 13.6 03/07/2022 0454   HGB 13.7 12/27/2021 1153   HCT 39.5 03/07/2022 0454   HCT 42.7 12/27/2021 1153   PLT 242 03/07/2022 0454   PLT 235 12/27/2021 1153   MCV 92.7 03/07/2022 0454   MCV 96 12/27/2021 1153   MCH 31.9 03/07/2022 0454   MCHC 34.4 03/07/2022 0454   RDW 13.9 03/07/2022 0454   RDW 13.0 12/27/2021 1153   LYMPHSABS 1.0 03/07/2022 0454   LYMPHSABS 0.9 12/27/2021 1153   MONOABS 1.1 (H) 03/07/2022 0454   EOSABS 0.2 03/07/2022 0454   EOSABS 0.1 12/27/2021 1153   BASOSABS 0.0 03/07/2022 0454   BASOSABS 0.1 12/27/2021  1153   CMP    Component Value Date/Time   NA 138 03/08/2022 0749   NA 143 12/27/2021 1153   K 3.8 03/08/2022 0749   CL 106 03/08/2022 0749   CO2 26 03/08/2022 0749   GLUCOSE 89 03/08/2022 0749   BUN 13 03/08/2022 0749   BUN 16 12/27/2021 1153   CREATININE 0.93 03/08/2022 0749   CALCIUM 8.8 (L) 03/08/2022 0749   PROT 5.6 (L) 03/07/2022 0454   PROT 6.4 12/27/2021 1153   ALBUMIN 3.0 (L) 03/07/2022 0454   ALBUMIN 4.1 12/27/2021 1153   AST 33 03/07/2022 0454   ALT 27 03/07/2022 0454   ALKPHOS 87 03/07/2022 0454   BILITOT 0.7 03/07/2022 0454   BILITOT 0.6 12/27/2021 1153   GFRNONAA >60 03/08/2022 0749   GFRAA 83 03/11/2020 1115   COAGS Lab Results  Component Value Date   INR 1.2 03/06/2022   INR 1.0 03/29/2021   INR 1.0 12/18/2019   Lipid Panel    Component Value Date/Time   CHOL 87 03/07/2022 0454   CHOL 102 12/27/2021 1153   TRIG 75 03/07/2022 0454   HDL 36 (L) 03/07/2022 0454   HDL 45 12/27/2021 1153   CHOLHDL 2.4 03/07/2022 0454   VLDL 15 03/07/2022 0454   LDLCALC 36 03/07/2022 0454   LDLCALC 42 12/27/2021 1153   HgbA1C  Lab Results  Component Value Date   HGBA1C 5.7 (H) 03/06/2022   Urinalysis    Component Value Date/Time   COLORURINE YELLOW 03/06/2022 1910   APPEARANCEUR CLEAR 03/06/2022 1910   LABSPEC >1.046 (H) 03/06/2022 1910   PHURINE 5.0 03/06/2022 1910   GLUCOSEU NEGATIVE 03/06/2022 1910   HGBUR NEGATIVE 03/06/2022 1910   BILIRUBINUR NEGATIVE 03/06/2022 1910   KETONESUR NEGATIVE 03/06/2022 1910   PROTEINUR NEGATIVE 03/06/2022 1910   NITRITE NEGATIVE 03/06/2022 1910   LEUKOCYTESUR MODERATE (A) 03/06/2022 1910   Urine Drug Screen     Component Value Date/Time   LABOPIA NONE DETECTED 03/06/2022 1910   COCAINSCRNUR NONE DETECTED 03/06/2022 1910   LABBENZ NONE DETECTED 03/06/2022 1910   AMPHETMU NONE DETECTED 03/06/2022 1910   THCU NONE DETECTED 03/06/2022 1910   LABBARB NONE DETECTED 03/06/2022 1910    Alcohol Level    Component Value  Date/Time   ETH <10 03/06/2022 1457     SIGNIFICANT DIAGNOSTIC STUDIES ECHOCARDIOGRAM COMPLETE  Result Date: 03/07/2022    ECHOCARDIOGRAM REPORT   Patient Name:   James Richards Date of Exam: 03/07/2022 Medical Rec #:  [id number]  Height:       65.0 in Accession #:    [id number]    Weight:       183.0 lb Date of Birth:  [birth year]    BSA:          1.905 m Patient Age:    77 years      BP:           116/85 mmHg Patient Gender: M             HR:           83 bpm. Exam Location:  Inpatient Procedure: 2D Echo, Color Doppler and Cardiac Doppler Indications:    Stroke  History:        Patient has prior history of Echocardiogram examinations, most                 recent 03/30/2021. CHF, Carotid Disease, TIA and Stroke,                 Arrythmias:Atrial Fibrillation; Risk Factors:Hypertension,                 Former Smoker and Dyslipidemia. 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  Sonographer:    Eartha Inch Referring Phys: [id number] Rikki Spearing  Sonographer Comments: Image acquisition challenging due to patient body habitus and Image acquisition challenging due to respiratory motion. IMPRESSIONS  1. Left ventricular ejection fraction, by estimation, is 60 to 65%. The left ventricle has normal function. The left ventricle has no regional wall motion abnormalities. Left ventricular diastolic parameters are consistent with Grade II diastolic dysfunction (pseudonormalization). Elevated left ventricular end-diastolic pressure.  2. Right ventricular systolic function is normal. The right ventricular size is normal.  3. The mitral valve is normal in structure. No evidence of mitral valve regurgitation. No evidence of mitral stenosis.  4. The aortic valve is normal in structure. Aortic valve regurgitation is not visualized. No aortic stenosis is present.  5. The inferior vena cava is normal in size with greater than 50%  respiratory variability, suggesting right atrial pressure of 3 mmHg. FINDINGS  Left Ventricle: Left ventricular ejection fraction, by estimation, is 60 to 65%. The left ventricle has normal function. The left ventricle has no regional wall motion abnormalities. The left ventricular internal cavity size was normal in size. There is  no left ventricular hypertrophy. Left ventricular diastolic parameters are consistent with Grade II diastolic dysfunction (pseudonormalization). Elevated left ventricular end-diastolic pressure. Right Ventricle: The right ventricular size is normal. No increase in right ventricular wall thickness. Right ventricular systolic function is normal. Left Atrium: Left atrial size was normal in size. Right Atrium: Right atrial size was normal in size. Pericardium: There is no evidence of pericardial effusion. Mitral Valve: The mitral valve is normal in structure. No evidence of mitral valve regurgitation. No evidence of mitral valve stenosis. Tricuspid Valve: The tricuspid valve is normal in structure. Tricuspid valve regurgitation is not demonstrated. No evidence of tricuspid stenosis. Aortic Valve: The aortic valve is normal in structure. Aortic valve regurgitation is not visualized. No aortic stenosis is present. Pulmonic Valve: The pulmonic valve was normal in structure. Pulmonic valve regurgitation is not visualized. No evidence of pulmonic stenosis. Aorta: The aortic root and ascending aorta are structurally normal, with no  evidence of dilitation. Venous: The inferior vena cava is normal in size with greater than 50% respiratory variability, suggesting right atrial pressure of 3 mmHg. IAS/Shunts: No atrial level shunt detected by color flow Doppler.  LEFT VENTRICLE PLAX 2D LVIDd:         3.90 cm     Diastology LVIDs:         2.90 cm     LV e' medial:    5.87 cm/s LV PW:         1.00 cm     LV E/e' medial:  15.8 LV IVS:        1.00 cm     LV e' lateral:   7.72 cm/s LVOT diam:     2.00 cm      LV E/e' lateral: 12.0 LV SV:         88 LV SV Index:   46 LVOT Area:     3.14 cm  LV Volumes (MOD) LV vol d, MOD A2C: 58.7 ml LV vol d, MOD A4C: 72.7 ml LV vol s, MOD A2C: 17.1 ml LV vol s, MOD A4C: 26.9 ml LV SV MOD A2C:     41.6 ml LV SV MOD A4C:     72.7 ml LV SV MOD BP:      45.4 ml RIGHT VENTRICLE RV S prime:     11.10 cm/s TAPSE (M-mode): 1.6 cm LEFT ATRIUM             Index        RIGHT ATRIUM           Index LA diam:        3.60 cm 1.89 cm/m   RA Area:     11.60 cm LA Vol (A2C):   27.2 ml 14.28 ml/m  RA Volume:   19.90 ml  10.45 ml/m LA Vol (A4C):   35.6 ml 18.69 ml/m LA Biplane Vol: 32.0 ml 16.80 ml/m  AORTIC VALVE              PULMONIC VALVE LVOT Vmax:   157.00 cm/s  PR End Diast Vel: 3.79 msec LVOT Vmean:  100.000 cm/s LVOT VTI:    0.280 m  AORTA Ao Root diam: 3.10 cm Ao Asc diam:  3.50 cm MITRAL VALVE MV Area (PHT): 3.77 cm    SHUNTS MV Decel Time: 201 msec    Systemic VTI:  0.28 m MV E velocity: 92.90 cm/s  Systemic Diam: 2.00 cm MV A velocity: 81.80 cm/s MV E/A ratio:  1.14 Skeet Latch MD Electronically signed by Skeet Latch MD Signature Date/Time: 03/07/2022/1:38:17 PM    Final    MR BRAIN WO CONTRAST  Result Date: 03/07/2022 CLINICAL DATA:  Stroke follow-up EXAM: MRI HEAD WITHOUT CONTRAST TECHNIQUE: Multiplanar, multiecho pulse sequences of the brain and surrounding structures were obtained without intravenous contrast. COMPARISON:  Head CT and CTA from yesterday FINDINGS: Brain: Moderate to large area of restricted diffusion primarily affecting posterior frontal and parietal cortex on the right. Patchy remote bilateral cerebral infarcts, most extensive on the left in the corona radiata and on the right at the superior and posterior temporal cortex. Ischemic gliosis in the deep cerebral white matter. Generalized cerebral volume loss. No hydrocephalus or masslike finding Vascular: Grossly preserved flow voids Skull and upper cervical spine: No gross marrow lesion Sinuses/Orbits:  No acute finding Other: Pervasive and significant motion artifact, pathology could easily be obscured. IMPRESSION: 1. Acute right MCA branch infarct affecting fronto-parietal cortex. 2. Multiple  chronic infarcts. 3. Significant motion degradation. Electronically Signed   By: Jorje Guild M.D.   On: 03/07/2022 06:03   CT CEREBRAL PERFUSION W CONTRAST  Result Date: 03/06/2022 EXAM: CT PERFUSION BRAIN TECHNIQUE: Multiphase CT imaging of the brain was performed following IV bolus contrast injection. Subsequent parametric perfusion maps were calculated using RAPID software. RADIATION DOSE REDUCTION: This exam was performed according to the departmental dose-optimization program which includes automated exposure control, adjustment of the mA and/or kV according to patient size and/or use of iterative reconstruction technique. CONTRAST:  547m OMNIPAQUE IOHEXOL 350 MG/ML SOLN, 416mOMNIPAQUE IOHEXOL 350 MG/ML SOLN COMPARISON:  Same day CTA and CT head. FINDINGS: CT Brain Perfusion Findings: CBF (<30%) Volume: 47m7merfusion (Tmax>6.0s) volume: 45m33msmatch Volume: 45mL5mECTS on noncontrast CT Head: 10 today. Infarct Core: 0 mL Infarction Location:None identified IMPRESSION: 1. Approximally 23 mL of penumbra in the right MCA territory, correlating with occluded vessel seen on CTA. 2. No evidence of core infarct. Findings discussed with Dr. StackQuinn Axetelephone at 3:56 p.m. Electronically Signed   By: FredeMargaretha Sheffield   On: 03/06/2022 15:58   CT ANGIO HEAD NECK W WO CM (CODE STROKE)  Result Date: 03/06/2022 CLINICAL DATA:  Neuro deficit, acute, stroke suspected L sided weakness R gaze preference EXAM: CT ANGIOGRAPHY HEAD AND NECK TECHNIQUE: Multidetector CT imaging of the head and neck was performed using the standard protocol during bolus administration of intravenous contrast. Multiplanar CT image reconstructions and MIPs were obtained to evaluate the vascular anatomy. Carotid stenosis measurements (when  applicable) are obtained utilizing NASCET criteria, using the distal internal carotid diameter as the denominator. RADIATION DOSE REDUCTION: This exam was performed according to the departmental dose-optimization program which includes automated exposure control, adjustment of the mA and/or kV according to patient size and/or use of iterative reconstruction technique. COMPARISON:  CTA Head March 29, 2021. FINDINGS: CTA NECK FINDINGS Aortic arch: Great vessel origins are patent. Right carotid system: Ulcerated atherosclerosis at the carotid bifurcation without greater than 50% stenosis. Left carotid system: Left ICA is occluded at its origin. Non opacified more distal ICA in the neck. Vertebral arteries: Chronically occluded right vertebral artery. Skeleton: No acute findings on limited assessment. Other neck: No acute findings on limited assessment. Upper chest: Visualized lung apices are clear when accounting for expiratory changes. Review of the MIP images confirms the above findings CTA HEAD FINDINGS Anterior circulation: Chronically occluded left ICA with reconstitution at the carotid terminus. Right ICA and right M1 MCA is patent. Occluded right mid M2 MCA branch in the region of a probable calcific embolus (series 10, image 82), which appears new from prior. Bilateral ACAs are patent. Posterior circulation: Chronically clear right vertebral artery. Left vertebral artery, basilar artery and bilateral posterior cerebral arteries are patent without proximal high-grade stenosis. Left fetal type PCA. Venous sinuses: As permitted by contrast timing, patent. Review of the MIP images confirms the above findings IMPRESSION: 1. Occluded right mid M2 MCA (series 10, image 82) likely due to calcified embolus. 2. Chronically occluded right vertebral artery and left ICA. FIndings discussed with Dr. StackQuinn Axe:24 [facility] Center[facility]ronically Signed   By: FredeMargaretha Sheffield   On: 03/06/2022 15:31   CT HEAD CODE STROKE WO  CONTRAST  Result Date: 03/06/2022 CLINICAL DATA:  Code stroke.  Neuro deficit, acute, stroke suspected EXAM: CT HEAD WITHOUT CONTRAST TECHNIQUE: Contiguous axial images were obtained from the base of the skull through the vertex without intravenous contrast. RADIATION DOSE REDUCTION:  This exam was performed according to the departmental dose-optimization program which includes automated exposure control, adjustment of the mA and/or kV according to patient size and/or use of iterative reconstruction technique. COMPARISON:  CT head March 29, 2021. FINDINGS: Brain: Similar remote infarcts in the left anterior temporal lobe, left MCA territory and right temporal lobe. No evidence of acute large vascular territory infarct, acute hemorrhage, mass lesion, midline shift or hydrocephalus. Vascular: No hyperdense vessel identified. Calcific atherosclerosis. Skull: No acute fracture. Sinuses/Orbits: Clear sinuses.  No acute orbital findings. Other: No mastoid effusions. ASPECTS [facility] Stroke Program Early CT Score) total score (0-10 with 10 being normal): 10. IMPRESSION: 1. No evidence of acute intracranial abnormality.  ASPECTS 10. 2. Similar remote infarcts, as above. Code stroke imaging results were communicated on 03/06/2022 at 3:19 pm to provider [facility] via secure text paging. Electronically Signed   By: Margaretha Sheffield M.D.   On: 03/06/2022 15:19      HISTORY OF PRESENT ILLNESS James Richards is a 77 y.o. 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 Patient lives with his cousin and POA, [name] who last saw patient in his usual state of health at 01:30 this morning. [name] went to church this morning and returned home at 13:30 finding Mr. Fetch with left-sided weakness and slurred speech. Patient consistently reports the symptoms acutely onset at 13:00 and notified [name] that he was having a stroke prompting emergency service activation. On arrival patient was found to have a right gaze preference, left arm weakness > left leg weakness, slurred speech, and left homonymous hemianopia and patient was taken for CT imaging. CBG on arrival 105, blood pressure 138/80.    HOSPITAL COURSE  Stroke: acute Right MCA infarct due to right M2 with calcified embolus etiology due to right ICA atherosclerosis   Code Stroke CT head No acute abnormality. ASPECTS 10.    CTA head & neck Occluded right mid M2 MCA likely due to calcified embolus. Chronically occluded right vertebral artery and left ICA. CT perfusion 0/23 Cerebral angio Occluded  M3 branch of the superior division of the right middle cerebral artery in the perisylvian triangle with modest  distal collateral reconstitution. Occluded left internal carotid artery proximally. MRI  Acute right MCA branch infarct affecting fronto-parietal cortex. Multiple chronic infarcts 2D Echo: LVEF 60-65% LDL 36 HgbA1c 5.7 UDS negative VTE prophylaxis - SCDs ASA 81 and plavix 75 prior to admission, now continued on home aspirin 81 mg daily and clopidogrel 75 mg daily. Pt also enrolled into Librexia trial.  Therapy recommendations:  home health PT/OT Disposition: Home today   Carotid stenosis/occlusion CT head and neck chronic left ICA occlusion and right ICA atherosclerosis No intervention needed given chronic occlusion   History of stroke 11/2014 admitted for angiogram for left ICA stenosis.  After  procedure patient developed aphasia.  MRI showed right MCA and tiny left occipital infarcts.  Stroke likely related to procedure.  Angiogram showed left ICA/CCA 60 to 70% stenosis.  CTA  neck showed left ICA 75 to 80% stenosis.  Carotid Doppler left ICA 60 to 79% stenosis.  EF 55 to 60%.  LDL 44.  Patient discharged on DAPT and Lipitor 80. 03/2021 patient admitted for left CRAO with partial improvement.  CTA head and neck showed left ICA occluded.  Right VA occlusion.  Bilateral P2 stenosis.  MRI showed no acute infarct.  EF 60 to 65%.  LDL 47, A1c 5.8.  Again discharged on DAPT and Lipitor 80.   Hypertension Home meds:  none BP monitoring at home Long-term BP goal 130-150 given left ICA chronic occlusion   Hyperlipidemia Home meds: Lipitor 80 LDL 36, goal < 70 Continue Lipitor 80 Continue statin on discharge   Other Stroke Risk Factors Advanced Age >/= 42  Former Cigarette smoker Obesity, Body mass index is 30.45 kg/m., BMI >/= 30 associated with increased stroke risk, recommend weight loss, diet and exercise as appropriate  Coronary artery disease CHF PVD   Other Active Problems Laryngeal/tongue cancer status post chemotherapy, radiation and partial glossectomy RLS Pulmonary fibrosis with chronic hypoxia Leukocytosis 7.4 -> 10.8.  No accompanying fevers, will monitor.   DISCHARGE EXAM Blood pressure 128/73, pulse 74, temperature 98.3 F (36.8 C), temperature source Oral, resp. rate 18, height '5\' 5"'$  (1.651 m), weight 83 kg, SpO2 94 %. General - Well nourished, well developed, in no apparent distress.   Cardiovascular - SR with 1st degree AVB seen on monitor and on EKG.  Pulmonary: unlabored, RA.    Mental Status -  Level of arousal and orientation to time, place, and person were intact Oriented to place, age, month, year. naming and repetition intact.  Dysarthria and drooling present (history of partial glossectomy 2016 d/t malignant neoplasm).   Cranial Nerves II - XII  - II - Decreased left eye vision when all fields are tested;  III, IV, VI - Extraocular movements intact. V - Facial sensation intact bilaterally. VII - Facial movement intact bilaterally. VIII - HOH at baseline X - Palate elevates symmetrically. XI - Chin turning & shoulder shrug intact bilaterally. XII - Tongue protrusion intact.   Motor Strength -  Right upper and lower extremities elevate antigravity with no drift. Weakness noted to left side, improved from noted assessments yesterday.  Sensory - normal sensation in all extremities    Coordination - Decreased fine motor LUE.  Tremor was absent.   Gait and Station - deferred.  Discharge Diet       Diet   DIET DYS 3 Room service appropriate? Yes with Assist; Fluid consistency: Thin   liquids  DISCHARGE PLAN Disposition:  home with home health PT/OT Librexia trial  for secondary stroke prevention. Please follow the instructions given for the trial. Ongoing stroke risk factor control by Primary Care Physician at time of discharge Follow-up PCP Rip Harbour, NP in 2 weeks. Follow-up in [facility] in 4 weeks, office to schedule an appointment.   35 minutes were spent preparing discharge.  Laurey Morale, MSN, NP-C [facility] See AMION or use Epic Chat  ATTENDING NOTE: I reviewed above note and agree with the assessment and plan. Pt was seen and examined.   Patient has not at bedside.  Patient lying in bed, awake alert, orientated x 3.  Slurred speech improved from yesterday.  No facial, cranial nerves intact.  Left visual and sensory simultanagnosia has resolved today.  Erolled into [place] trial yesterday, on investigational drugs.  Continue aspirin 81 and Plavix DAPT home  meds.  Continue home Lipitor.  PT therapy recommend home health, patient is ready for discharge.  Follow-up at [facility] stroke clinic in 4 weeks.  For detailed assessment and plan, please refer to above/below  as I have made changes wherever appropriate.    Rosalin Hawking, MD PhD Stroke Neurology 03/08/2022 7:08 PM

## 2022-03-08 NOTE — Progress Notes (Signed)
Physical Therapy Treatment Patient Details Name: James Richards MRN: [id number] DOB: 26-Jan-1946 Today's Date: 03/08/2022   History of Present Illness James Richards is a 77 y.o. male  who presented to the ED 03/06/22 via EMS from home for acute onset of left-sided weakness and slurred speech. IR procedure 1/21. MRI + Acute right MCA branch infarct affecting fronto-parietal cortex.  PMH: HTN, chronic left carotid stenosis and chronic occlusion of right vertebral artery, recurrent TIAs, CVAs including CRAO in February of 2023, amaurosis fugax, tobacco use, malignant neoplasm of tongue and larynx s/p chemo and radiation and right neck lymph node dissection 2009 with recurrence in 2016 s/p partial glossectomy, mixed hyperlipidemia, PVD, chronic diastolic heart failure, idiopathic pulmonary fibrosis with chronic hypoxia, and RLS    PT Comments    Pt greeted up in chair on arrival and eager for mobility, pt with some impulsivity secondary to motivation with pt receptive to cues to slow. Pt demonstrating improved gait stability and tolerance with RW use this date with pt endorsing understanding of benefits and importance of use post acutely. Pt requiring grossly min guard throughout gait for safety with pt demonstrating decreased environmental awareness on L and L inattention needing cues to attend to and find room on L. Pt continues to be limited by decreased safety awareness, impaired balance, LLE weakness and increased fall risk. Pt continues to benefit from skilled PT services to progress toward functional mobility goals.     Recommendations for follow up therapy are one component of a multi-disciplinary discharge planning process, led by the attending physician.  Recommendations may be updated based on patient status, additional functional criteria and insurance authorization.  Follow Up Recommendations  Home health PT     Assistance Recommended at Discharge Frequent or constant Supervision/Assistance   Patient can return home with the following A little help with walking and/or transfers;A little help with bathing/dressing/bathroom;Direct supervision/assist for medications management;Assist for transportation;Help with stairs or ramp for entrance   Equipment Recommendations  Rolling walker (2 wheels)    Recommendations for Other Services       Precautions / Restrictions Precautions Precautions: Fall Precaution Comments: L inattention Restrictions Weight Bearing Restrictions: No     Mobility  Bed Mobility Overal bed mobility: Needs Assistance Bed Mobility: Sit to Sidelying         Sit to sidelying: Supervision      Transfers Overall transfer level: Needs assistance Equipment used: Rolling walker (2 wheels) Transfers: Sit to/from Stand Sit to Stand: Min assist           General transfer comment: minA to power up and steady on rise, cues for hand placement    Ambulation/Gait Ambulation/Gait assistance: Min assist, Min guard Gait Distance (Feet): 225 Feet Assistive device: Rolling walker (2 wheels) Gait Pattern/deviations: Step-through pattern, Decreased stride length, Wide base of support Gait velocity: dec     General Gait Details: wide base of support and low clearance of LLE, neck flexed throughout with pt reporting this as baseline since prior sx/radiationm cues throughout to attend to L environment, pt trying to enter room on R twice when his room was on L needing cues to turn and look   Stairs Stairs: Yes Stairs assistance: Min guard Stair Management: Two rails, Alternating pattern, Forwards Number of Stairs: 2 General stair comments: up/down steps in terhapy gym without fault   Wheelchair Mobility    Modified Rankin (Stroke Patients Only) Modified Rankin (Stroke Patients Only) Pre-Morbid Rankin Score: Moderate disability Modified Rankin: Moderately  severe disability     Balance Overall balance assessment: Needs assistance Sitting-balance  support: Feet supported, No upper extremity supported Sitting balance-Leahy Scale: Fair (L bias)     Standing balance support: During functional activity, Single extremity supported, Reliant on assistive device for balance Standing balance-Leahy Scale: Fair Standing balance comment: reliant on external support for safe ambulation                            Cognition Arousal/Alertness: Awake/alert Behavior During Therapy: WFL for tasks assessed/performed, Impulsive Overall Cognitive Status: No family/caregiver present to determine baseline cognitive functioning Area of Impairment: Safety/judgement, Awareness, Attention, Problem solving                   Current Attention Level: Selective     Safety/Judgement: Decreased awareness of safety, Decreased awareness of deficits Awareness: Emergent Problem Solving: Slow processing General Comments: impaired safety awareness; HOH impacting his performance; VERY poor awareness of lines, somewhat impulsive        Exercises      General Comments General comments (skin integrity, edema, etc.): VSS on RA, pt with drooling throughout, needing cues to attend      Pertinent Vitals/Pain Pain Assessment Pain Assessment: No/denies pain Pain Intervention(s): Monitored during session    Home Living                          Prior Function            PT Goals (current goals can now be found in the care plan section) Acute Rehab PT Goals Patient Stated Goal: home PT Goal Formulation: With patient Time For Goal Achievement: 03/21/22 Progress towards PT goals: Progressing toward goals    Frequency    Min 4X/week      PT Plan      Co-evaluation              AM-PAC PT "6 Clicks" Mobility   Outcome Measure  Help needed turning from your back to your side while in a flat bed without using bedrails?: A Little Help needed moving from lying on your back to sitting on the side of a flat bed without  using bedrails?: A Little Help needed moving to and from a bed to a chair (including a wheelchair)?: A Little Help needed standing up from a chair using your arms (e.g., wheelchair or bedside chair)?: A Little Help needed to walk in hospital room?: A Lot Help needed climbing 3-5 steps with a railing? : A Lot 6 Click Score: 16    End of Session Equipment Utilized During Treatment: Gait belt Activity Tolerance: Patient tolerated treatment well Patient left: with call bell/phone within reach;in bed;with bed alarm set Nurse Communication: Mobility status PT Visit Diagnosis: Unsteadiness on feet (R26.81);Muscle weakness (generalized) (M62.81)     Time: 4401-0272 PT Time Calculation (min) (ACUTE ONLY): 20 min  Charges:  $Gait Training: 8-22 mins                    Audry Riles. PTA Acute Rehabilitation Services Office: [facility] 03/08/2022, 11:00 AM

## 2022-03-08 NOTE — Progress Notes (Addendum)
Speech Language Pathology Treatment: Dysphagia  Patient Details Name: James Richards MRN: [id number] DOB: 1945-03-06 Today's Date: 03/08/2022 Time: 1345-1400 SLP Time Calculation (min) (ACUTE ONLY): 15 min  Assessment / Plan / Recommendation Clinical Impression  Patient seen by SLP for skilled treatment focused on dysphagia goals. Patient was awake, alert, sitting at edge of bed and eating lunch when SLP arrived. His wife was present in room and she was pleased that he was able to eat the meat (pot roast) as she was thinking he would need softer foods.  Aside from mildly prolonged mastication, no overt s/s aspiration or penetration and full oral clearance. He did exhibit some drooling which is likely combination of h/o partial glossectomy and currently recovering from CVA. SLP provided handout describing mechanical soft solids diet and reviewed with patient's spouse. In addition, SLP brought denture adhesive. Recommendation is for home health SLP to continue with goals of advancing PO's and for speech intelligibility but as patient is planned discharge today, SLP to s/o at this time.   HPI HPI: James Richards is a 77 y.o. male  who presented to the ED 03/06/22 via EMS from home for acute onset of left-sided weakness and slurred speech. IR procedure 1/21. MRI + Acute right MCA branch infarct affecting fronto-parietal cortex.  PMH: malignant neoplasm of tongue and larynx s/p chemo and radiation and right neck lymph node dissection 2009 with recurrence in 2016 s/p partial glossectomy, HTN, chronic left carotid stenosis and chronic occlusion of right vertebral artery, recurrent TIAs, CVAs including CRAO in February of 2023, amaurosis fugax, tobacco use,, mixed hyperlipidemia, PVD, chronic diastolic heart failure, idiopathic pulmonary fibrosis with chronic hypoxia, and RLS      SLP Plan  All goals met      Recommendations for follow up therapy are one component of a multi-disciplinary discharge planning  process, led by the attending physician.  Recommendations may be updated based on patient status, additional functional criteria and insurance authorization.    Recommendations  Diet recommendations: Dysphagia 3 (mechanical soft);Thin liquid Liquids provided via: Cup;Straw Medication Administration: Whole meds with puree Supervision: Patient able to self feed;Intermittent supervision to cue for compensatory strategies Compensations: Minimize environmental distractions;Slow rate;Small sips/bites Postural Changes and/or Swallow Maneuvers: Seated upright 90 degrees                Oral Care Recommendations: Oral care BID Follow Up Recommendations: Home health SLP Assistance recommended at discharge: Frequent or constant Supervision/Assistance SLP Visit Diagnosis: Dysphagia, unspecified (R13.10) Plan: All goals met          James Baller, MA, CCC-SLP Speech Therapy

## 2022-03-08 NOTE — Care Management Important Message (Signed)
Important Message  Patient Details  Name: James Richards MRN: [id number] Date of Birth: 27-Jan-1946   Medicare Important Message Given:  Yes     [name] [name] 03/08/2022, 12:56 PM

## 2022-03-08 NOTE — Plan of Care (Signed)
VeRN and pt and POA [name]) reviewed the discharge instruction. VeRN answered their questions. They stated they understood the information given.  Problem: Education: Goal: Knowledge of disease or condition will improve Outcome: Adequate for Discharge Goal: Knowledge of secondary prevention will improve (MUST DOCUMENT ALL) Outcome: Adequate for Discharge Goal: Knowledge of patient specific risk factors will improve Elta [place] N/A or DELETE if not current risk factor) Outcome: Adequate for Discharge   Problem: Ischemic Stroke/TIA Tissue Perfusion: Goal: Complications of ischemic stroke/TIA will be minimized Outcome: Adequate for Discharge   Problem: Coping: Goal: Will verbalize positive feelings about self Outcome: Adequate for Discharge Goal: Will identify appropriate support needs Outcome: Adequate for Discharge   Problem: Health Behavior/Discharge Planning: Goal: Ability to manage health-related needs will improve Outcome: Adequate for Discharge Goal: Goals will be collaboratively established with patient/family Outcome: Adequate for Discharge   Problem: Self-Care: Goal: Ability to participate in self-care as condition permits will improve Outcome: Adequate for Discharge Goal: Verbalization of feelings and concerns over difficulty with self-care will improve Outcome: Adequate for Discharge Goal: Ability to communicate needs accurately will improve Outcome: Adequate for Discharge   Problem: Nutrition: Goal: Risk of aspiration will decrease Outcome: Adequate for Discharge Goal: Dietary intake will improve Outcome: Adequate for Discharge   Problem: Education: Goal: Understanding of CV disease, CV risk reduction, and recovery process will improve Outcome: Adequate for Discharge Goal: Individualized Educational Video(s) Outcome: Adequate for Discharge   Problem: Activity: Goal: Ability to return to baseline activity level will improve Outcome: Adequate for Discharge    Problem: Cardiovascular: Goal: Ability to achieve and maintain adequate cardiovascular perfusion will improve Outcome: Adequate for Discharge Goal: Vascular access site(s) Level 0-1 will be maintained Outcome: Adequate for Discharge   Problem: Health Behavior/Discharge Planning: Goal: Ability to safely manage health-related needs after discharge will improve Outcome: Adequate for Discharge   Problem: Education: Goal: Knowledge of General Education information will improve Description: Including pain rating scale, medication(s)/side effects and non-pharmacologic comfort measures Outcome: Adequate for Discharge   Problem: Health Behavior/Discharge Planning: Goal: Ability to manage health-related needs will improve Outcome: Adequate for Discharge   Problem: Clinical Measurements: Goal: Ability to maintain clinical measurements within normal limits will improve Outcome: Adequate for Discharge Goal: Will remain free from infection Outcome: Adequate for Discharge Goal: Diagnostic test results will improve Outcome: Adequate for Discharge Goal: Respiratory complications will improve Outcome: Adequate for Discharge Goal: Cardiovascular complication will be avoided Outcome: Adequate for Discharge   Problem: Activity: Goal: Risk for activity intolerance will decrease Outcome: Adequate for Discharge   Problem: Nutrition: Goal: Adequate nutrition will be maintained Outcome: Adequate for Discharge   Problem: Coping: Goal: Level of anxiety will decrease Outcome: Adequate for Discharge   Problem: Elimination: Goal: Will not experience complications related to bowel motility Outcome: Adequate for Discharge Goal: Will not experience complications related to urinary retention Outcome: Adequate for Discharge   Problem: Pain Managment: Goal: General experience of comfort will improve Outcome: Adequate for Discharge   Problem: Safety: Goal: Ability to remain free from injury will  improve Outcome: Adequate for Discharge   Problem: Skin Integrity: Goal: Risk for impaired skin integrity will decrease Outcome: Adequate for Discharge

## 2022-03-08 NOTE — TOC Transition Note (Signed)
Transition of Care [facility]) - CM/SW Discharge Note   Patient Details  Name: James Richards MRN: [id number] Date of Birth: [birth year]  [facility] CM/SW Contact:  James Friar, RN Phone Number: 03/08/2022, 2:41 PM   Clinical Narrative:    Pt is discharging home with home health services through [place]. Information on the AVS.  Walker ordered through [place] and will be delivered to the room. Pts cousin to provide transport home.    Final next level of care: Home w Home Health Services Barriers to Discharge: No Barriers Identified   Patient Goals and CMS Choice CMS Medicare.gov Compare Post Acute Care list provided to:: Patient Choice offered to / list presented to : Patient, [facility] POA / Guardian  Discharge Placement                         Discharge Plan and Services Additional resources added to the After Visit Summary for     Discharge Planning Services: CM Consult Post Acute Care Choice: Home Health          DME Arranged: Walker rolling DME Agency: [employer] Date DME Agency Contacted: 03/08/22   Representative spoke with at DME Agency: Erasmo Downer HH Arranged: RN, PT, OT, Speech Therapy HH Agency: [place] Date [place]: 03/07/22   Representative spoke with at [place]: [place] ([place]) Interventions SDOH Screenings   Food Insecurity: No Food Insecurity (03/08/2022)  Housing: Low Risk  (03/08/2022)  Transportation Needs: No Transportation Needs (03/08/2022)  Utilities: Not At Risk (03/08/2022)  Alcohol Screen: Low Risk  (12/27/2021)  Depression (PHQ2-9): Low Risk  (12/27/2021)  Financial Resource Strain: Low Risk  (08/26/2021)  Physical Activity: Inactive (08/26/2021)  Social Connections: Moderately Isolated (08/26/2021)  Stress: No Stress Concern Present (08/26/2021)  Tobacco Use: Medium Risk (03/08/2022)     Readmission Risk Interventions     No data to display

## 2022-03-09 ENCOUNTER — Telehealth: Payer: Self-pay | Admitting: *Deleted

## 2022-03-09 NOTE — Progress Notes (Signed)
  Care Coordination  Note  03/09/2022 Name: James Richards MRN: [id number] DOB: 01-16-1946  James Richards is a 77 y.o. year old primary care patient of Rip Harbour, NP. I reached out to Tresa Endo by phone today to assist with scheduling a follow up appointment. Tresa Endo verbally consented to my assistance.       Follow up plan: Hospital Follow Up appointment scheduled with (Dr Tobie Poet) on (03/10/2022) at (11am).  Julian Hy, [place] Direct Dial: [phone]

## 2022-03-09 NOTE — Patient Outreach (Addendum)
Care Coordination [facility] Note Transition Care Management Follow-up Telephone Call Date of discharge and from where: 03/08/22; [place]; Acute ischemic stroke/ (L) sided weakness; slurred speech How have you been since you were released from the hospital? Per caregiver/ cousin Henderson, on [address] [facility] DPR: "I am his cousin; we have lived in the same home for over 20 years and we look out for each other.  He is doing much better.  Not even having to use the walker, but we have it if he needs it; his walking is very steady and he has been able to get dressed and go to the bathroom by himself; I do handle his medications, and make sure he has something to eat.  No questions about his medication- there is one that costs [place] quite a bit of money, but we have been working with the insurance company to get that covered-- we have already applied for patient assistance and know that we do not qualify.  But I am in touch with the insurance company, so we'll see what happens.  Have not heard from home health yet.  Thank you for telling me why the daily weights at home are so important-- no one explained to [place] that weight gain is the earliest indicator of fluid retention, so I will start making sure he weighs every day.  I will also start checking his blood pressures every day.  Please do have the scheduler call me to schedule the appointment with [facility], and I will call the neurology doctor to schedule with them" Any questions or concerns? Yes 1) have not yet heard from home health agency; per IP TOC note, this was ordered through [place]; verified no information on AVS; explained to caregiver that it sometimes takes several days for home health to make outreach- no phone number included in IP TOC transition note-- provided phone number for [facility] branch [phone]) and encouraged caregiver to call them tomorrow afternoon of she has not heard from them by then; I also provided my direct contact information should she  require assistance in the future 2) knowledge deficit regarding purpose/ rationale for monitoring/ recording daily weights at home: caregiver states she has been administering diuretic only when he exhibits signs of peripheral LE swelling: provided extensive education around purpose/ rationale for daily weight monitoring and recording along with weight gain guidelines as earliest indicator of fluid retention; importance of ensuring low sodium diet; benefit of monitoring blood pressures as well-- caregiver reports she will begin doing moving forward 3) No scheduled PCP hospital follow up visit: request sent to scheduling care guide to facilitate scheduling of same 4) scheduled RN CM Care Coordinator follow up call for next week to follow up on all of above   Items Reviewed: Did the pt receive and understand the discharge instructions provided? Yes  Medications obtained and verified? Yes  full medication review completed; no concerns or discrepancies identified; confirmed caregiver has obtained and patient is taking all newly prescribed medications post-recent hospital discharge; confirmed caregiver manages all aspects of medication administration- states she plans to obtain pill box from outpatient pharmacy today and start using accordingly; she denies questions/ concerns around medications- as above, is working with insurance company to obtain coverage for "one of his medications that cost a lot;" confirmed she has already explored options for patient assistance and reports patient does not qualify; provided extensive education around difference between rescue/ maintenance inhalers; confirmed caregiver currently administering medication in applesauce, post-recent CVA Other? No  Any new  allergies since your discharge? No  Dietary orders reviewed? Yes Do you have support at home? Yes  caregiver reports patient is essentially independent in all self-care activities; reports she assists as/ if  indicated  Home Care and Equipment/Supplies: Were home health services ordered? yes If so, what is the name of the agency? [facility]  Has the agency set up a time to come to the patient's home? No- see above Were any new equipment or medical supplies ordered?  Yes: walker  caregiver reports patient is not needing to use at current time What is the name of the medical supply agency? [employer] Were you able to get the supplies/equipment? yes Do you have any questions related to the use of the equipment or supplies? No  Functional Questionnaire: (I = Independent and D = Dependent) ADLs: I  Bathing/Dressing- I  Meal Prep- I  caregiver assists as/ if indicated  Eating- I  Maintaining continence- I  Transferring/Ambulation- I  Managing Meds- D caregiver manages all aspects of medication administration  Follow up appointments reviewed:  PCP Hospital f/u appt confirmed? No  Scheduled to see - on - @ - placed request with scheduling care guide team to facilitate scheduling of same; recommended within 10 days of hospital discharge [place] [facility] f/u appt confirmed? No  Scheduled to see - on - @ - confirmed caregiver has phone number for referred neurology provider; neurology provider office visit recommended within 4 weeks of discharge; caregiver reports she will independently schedule accordingly Are transportation arrangements needed? No  If their condition worsens, is the pt aware to call PCP or go to the Emergency Dept.? Yes Was the patient provided with contact information for the PCP's office or ED? Yes- for [place] home health and neurology provider; reports has all contact numbers for other care providers Was to pt encouraged to call back with questions or concerns? Yes- provided my direct contact information should questions/ concerns/ needs arise prior to scheduled RN CM Care Coordinator follow up telephone visit, scheduled for next week on 03/14/22  SDOH assessments and  interventions completed:   Yes SDOH Interventions Today    Flowsheet Row Most Recent Value  SDOH Interventions   Food Insecurity Interventions Intervention Not Indicated  Transportation Interventions Intervention Not Indicated  [cousin/ caregiver provides all transportation]      Care Coordination Interventions:  PCP follow up appointment requested [facility] for Care Coordination Services:  RN Care Coordinator Full medication review completed with education/ clarification of difference between rescue and maintenance inhalers; provided extensive education around self- management of CHF; care coordination outreach to RN CM Care Coordinator, scheduled for follow up next week    Encounter Outcome:  Pt. Visit Completed    Oneta Rack, RN, BSN, CCRN Alumnus RN CM Care Coordination/ Transition of [place] Management [phone]: direct office

## 2022-03-10 ENCOUNTER — Ambulatory Visit (INDEPENDENT_AMBULATORY_CARE_PROVIDER_SITE_OTHER): Payer: Medicare PPO | Admitting: Family Medicine

## 2022-03-10 VITALS — BP 120/80 | HR 70 | Temp 97.3°F | Resp 18 | Ht 65.0 in | Wt 178.0 lb

## 2022-03-10 DIAGNOSIS — I110 Hypertensive heart disease with heart failure: Secondary | ICD-10-CM

## 2022-03-10 DIAGNOSIS — I6601 Occlusion and stenosis of right middle cerebral artery: Secondary | ICD-10-CM

## 2022-03-10 DIAGNOSIS — G8194 Hemiplegia, unspecified affecting left nondominant side: Secondary | ICD-10-CM | POA: Diagnosis not present

## 2022-03-10 DIAGNOSIS — E782 Mixed hyperlipidemia: Secondary | ICD-10-CM | POA: Diagnosis not present

## 2022-03-10 DIAGNOSIS — I5032 Chronic diastolic (congestive) heart failure: Secondary | ICD-10-CM

## 2022-03-10 NOTE — Progress Notes (Signed)
Subjective:  Patient ID: James Richards, male    DOB: Jul 09, 1945  Age: 77 y.o. MRN: [id number]  Chief Complaints: Stroke  History of Present illness:  Follow up Hospitalization  Patient was admitted to [facility] on 03/06/2022 and discharged on 03/08/2022. He was treated for Stroke: acute Right MCA infarct due to right M2 with calcified embolus etiology due to right ICA atherosclerosis. His wife found him and left sided paralysis and left facial droop. Patient was aware and answering questions appropriately.  Brought by EMS. Improved since recent visit.   Patient has had numerous TIAS and Strokes. Decreased since on atorvastatin. Treatment for this included STOP omeprazole 40 mg, Order home health PT/OT, Librexia trial. Telephone follow up was done on 03/09/2022 He reports good compliance with treatment. He reports this condition is improved.   Code Stroke CT head No acute abnormality. ASPECTS 10.    CTA head & neck Occluded right mid M2 MCA likely due to calcified embolus. Chronically occluded right vertebral artery and left ICA. CT perfusion 0/23 Cerebral angio Occluded  M3 branch of the superior division of the right middle cerebral artery in the perisylvian triangle with modest  distal collateral reconstitution. Occluded left internal carotid artery proximally. MRI  Acute right MCA branch infarct affecting fronto-parietal cortex. Multiple chronic infarcts 2D Echo: LVEF 60-65% LDL 36 HgbA1c 5.7 UDS negative VTE prophylaxis - SCDs ASA 81 and plavix 75 prior to admission, now continued on home aspirin 81 mg daily and clopidogrel 75 mg daily. Pt also enrolled into Librexia trial.  Therapy recommendations:  home health PT/OT Disposition: Home today   Long-term BP goal 130-150 given left ICA chronic occlusion  Patient is to see Neurology in next few weeks.   Current Outpatient Medications on File Prior to Visit  Medication Sig Dispense Refill    acetaminophen-codeine (TYLENOL #3) 300-30 MG tablet TAKE 1 TABLET EVERY 4 HOURS AS NEEDED FOR MODERATE PAIN (Patient taking differently: Take 1 tablet by mouth every 4 (four) hours as needed for moderate pain or severe pain.) 42 tablet 0   albuterol (VENTOLIN HFA) 108 (90 Base) MCG/ACT inhaler Inhale 2 puffs into the lungs every 6 (six) hours as needed for wheezing or shortness of breath.     ascorbic acid (VITAMIN C) 500 MG tablet Take 500 mg by mouth daily.     aspirin EC 81 MG tablet Take 81 mg by mouth daily.     atorvastatin (LIPITOR) 80 MG tablet TAKE 1 TABLET EVERY DAY (Patient taking differently: Take 80 mg by mouth daily.) 90 tablet 2   CINNAMON PO Take 1 capsule by mouth daily.     clopidogrel (PLAVIX) 75 MG tablet Take 1 tablet (75 mg total) by mouth daily. 90 tablet 2   furosemide (LASIX) 40 MG tablet Take 1 tablet (40 mg total) by mouth every other day. (Patient taking differently: Take 40 mg by mouth daily as needed for fluid or edema.) 90 tablet 2   GARLIC PO Take 580 mg by mouth daily.     Ginkgo Biloba (GINKOBA PO) Take 120 mg by mouth daily.     levothyroxine (SYNTHROID) 125 MCG tablet Take 1 tablet (125 mcg total) by mouth daily. 90 tablet 1   losartan (COZAAR) 50 MG tablet Take 1 tablet (50 mg total) by mouth daily. 90 tablet 3   minocycline (MINOCIN) 50 MG capsule Take 1 capsule (50 mg total) by mouth daily. (Patient taking differently: Take 50 mg by mouth every 12 (  twelve) hours.) 90 capsule 2   Omega-3 Fatty Acids (FISH OIL) 1200 MG CPDR Take 1 capsule by mouth daily.     pantoprazole (PROTONIX) 40 MG tablet Take 1 tablet (40 mg total) by mouth daily. 90 tablet 3   Potassium Bicarbonate 99 MG CAPS Take 99 mg by mouth daily.     rOPINIRole (REQUIP) 2 MG tablet Take 2 mg by mouth at bedtime as needed (restless legs).     Study - LIBREXIA-STROKE - milvexian 25 mg or placebo tablet (PI-Sethi) Take 1 tablet by mouth 2 (two) times daily. For Investigational Use Only. Take with or  without food at approximately the same time each day. 70 tablet 0   SYMBICORT 160-4.5 MCG/ACT inhaler Inhale 2 puffs into the lungs daily.     vitamin B-12 (CYANOCOBALAMIN) 500 MCG tablet Take 500 mcg by mouth daily.     zinc gluconate 50 MG tablet Take 50 mg by mouth daily.     No current facility-administered medications on file prior to visit.   Past Medical History:  Diagnosis Date   Atherosclerosis of native arteries of the extremities with intermittent claudication 10/09/2013   Atherosclerotic peripheral vascular disease with intermittent claudication ([place]) 10/03/2012   BMI 33.0-33.9,adult 07/07/2019   Carotid stenosis 50/53/9767   Diastolic dysfunction 34/19/3790   Essential hypertension    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   History of recurrent TIAs 12/11/2014   Impacted cerumen 11/26/2019   Malignant neoplasm of base of tongue ([place]) 01/21/2015   Malignant neoplasm of larynx, unspecified (HCC)    Migraine 04/02/2014   Mixed hyperlipidemia    Other fatigue    Other specified hypothyroidism    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    Restless legs syndrome    Restrictive lung disease 11/26/2019   TIA (transient ischemic attack) 11/24/2014   Vision exam with abnormal findings concern for left retinal artery occlusion 03/29/2021   Past Surgical History:  Procedure Laterality Date   IR CT HEAD LTD  03/06/2022   IR PERCUTANEOUS ART THROMBECTOMY/INFUSION INTRACRANIAL INC DIAG ANGIO  03/06/2022   LYMPHADENECTOMY     PERIPHERAL VASCULAR CATHETERIZATION N/A 11/24/2014   Procedure: Aortic Arch Angiography;  Surgeon: Angelia Mould, MD;  Location: [facility];  Service: Cardiovascular;  Laterality: N/A;   PERIPHERAL VASCULAR CATHETERIZATION N/A 11/24/2014   Procedure: Cerebral Angiography;  Surgeon: Angelia Mould, MD;  Location: [facility];  Service: Cardiovascular;  Laterality: N/A;   RADIOLOGY WITH ANESTHESIA N/A  03/06/2022   Procedure: RADIOLOGY WITH ANESTHESIA;  Surgeon: Radiologist, Medication, MD;  Location: [facility];  Service: Radiology;  Laterality: N/A;   THROAT SURGERY     TONSILECTOMY/ADENOIDECTOMY WITH MYRINGOTOMY      Family History  Problem Relation Age of Onset   Other Father        amputation   Deep vein thrombosis Father    Heart disease Father        Atrial Fib.   Cancer Sister    Hypertension Brother    Social History   Socioeconomic History   Marital status: Divorced    Spouse name: Not on file   Number of children: Not on file   Years of education: Not on file   Highest education level: Not on file  Occupational History   Not on file  Tobacco Use   Smoking status: Former    Packs/day: 1.00    Years: 41.00    Total pack years: 41.00  Types: Cigarettes    Quit date: 02/15/2007    Years since quitting: 15.0   Smokeless tobacco: Former    Types: Nurse, children's Use: Never used  Substance and Sexual Activity   Alcohol use: No    Alcohol/week: 0.0 standard drinks of alcohol   Drug use: No   Sexual activity: Not Currently    Partners: Female  Other Topics Concern   Not on file  Social History Narrative   Not on file   Social Determinants of Health   Financial Resource Strain: Low Risk  (08/26/2021)   Overall Financial Resource Strain (CARDIA)    Difficulty of Paying Living Expenses: Not hard at all  Food Insecurity: No Food Insecurity (03/09/2022)   Hunger Vital Sign    Worried About Running Out of Food in the Last Year: Never true    Ran Out of Food in the Last Year: Never true  Transportation Needs: No Transportation Needs (03/09/2022)   PRAPARE - Hydrologist (Medical): No    Lack of Transportation (Non-Medical): No  Physical Activity: Inactive (08/26/2021)   Exercise Vital Sign    Days of Exercise per Week: 0 days    Minutes of Exercise per Session: 0 min  Stress: No Stress Concern Present (08/26/2021)   [place]    Feeling of Stress : Not at all  Social Connections: Moderately Isolated (08/26/2021)   Social Connection and Isolation Panel [NHANES]    Frequency of Communication with Friends and Family: More than three times a week    Frequency of Social Gatherings with Friends and Family: More than three times a week    Attends Religious Services: More than 4 times per year    Active Member of [employer] or Organizations: No    Attends Archivist Meetings: Never    Marital Status: Divorced    Review of Systems  Constitutional:  Positive for fatigue. Negative for chills and fever.  HENT:  Negative for congestion, ear pain and sore throat.   Respiratory:  Negative for cough and shortness of breath.   Cardiovascular:  Negative for chest pain and palpitations.  Gastrointestinal:  Negative for abdominal pain, constipation, diarrhea, nausea and vomiting.  Endocrine: Negative for polydipsia, polyphagia and polyuria.  Genitourinary:  Negative for dysuria and frequency.  Musculoskeletal:  Negative for arthralgias and back pain.  Neurological:  Negative for dizziness and headaches.  Psychiatric/Behavioral:  Negative for dysphoric mood. The patient is not nervous/anxious.      Objective:  BP 120/80   Pulse 70   Temp (!) 97.3 F (36.3 C)   Resp 18   Ht '5\' 5"'$  (1.651 m)   Wt 178 lb (80.7 kg)   SpO2 98%   BMI 29.62 kg/m      03/14/2022    3:36 PM 03/10/2022   10:52 AM 03/08/2022    3:27 PM  BP/Weight  Systolic BP  003 491  Diastolic BP  80 90  Wt. (Lbs) 178 178   BMI 29.62 kg/m2 29.62 kg/m2     Physical Exam Vitals reviewed.  Constitutional:      Appearance: Normal appearance.  Neck:     Vascular: No carotid bruit.  Cardiovascular:     Rate and Rhythm: Normal rate and regular rhythm.     Pulses: Normal pulses.     Heart sounds: Normal heart sounds.  Pulmonary:     Effort: Pulmonary  effort is normal.     Breath  sounds: Normal breath sounds.  Abdominal:     General: Bowel sounds are normal.     Palpations: Abdomen is soft.     Tenderness: There is no abdominal tenderness.  Neurological:     General: No focal deficit present.     Mental Status: He is alert and oriented to person, place, and time.     Cranial Nerves: No cranial nerve deficit.     Sensory: Sensory deficit (left face) present.     Motor: Weakness (left leg and arm (mild)) present.  Psychiatric:        Mood and Affect: Mood normal.        Behavior: Behavior normal.     Diabetic Foot Exam - Simple   No data filed      Lab Results  Component Value Date   WBC 10.8 (H) 03/07/2022   HGB 13.6 03/07/2022   HCT 39.5 03/07/2022   PLT 242 03/07/2022   GLUCOSE 89 03/08/2022   CHOL 87 03/07/2022   TRIG 75 03/07/2022   HDL 36 (L) 03/07/2022   LDLCALC 36 03/07/2022   ALT 27 03/07/2022   AST 33 03/07/2022   NA 138 03/08/2022   K 3.8 03/08/2022   CL 106 03/08/2022   CREATININE 0.93 03/08/2022   BUN 13 03/08/2022   CO2 26 03/08/2022   TSH 0.774 08/24/2021   INR 1.2 03/06/2022   HGBA1C 5.7 (H) 03/06/2022      Assessment & Plan:  Left hemiparesis ([place]) Assessment & Plan: Partial hemiparesis.  Secondary to stroke.  Continue home physical therapy and Occupation therapy.  Follow up with neurology.   Middle cerebral artery stenosis, right Assessment & Plan: Continue aspirin, plavix, and enrolled into librexia trial.   Hypertensive heart disease with chronic diastolic congestive heart failure (HCC) Assessment & Plan: Continue losartan 50 mg daily, Lasix 40 mg as prescribed.  Long-term BP goal 130-150 given left ICA chronic occlusion    Mixed hyperlipidemia Assessment & Plan: Well controlled.  No changes to medicines. Continue atorvastatin 80 mg before bed. Continue to work on eating a healthy diet and exercise.  Labs drawn today.        Follow-up: Return in about 4 months (around 07/09/2022) for chronic  fasting.  An After Visit Summary was printed and given to the patient.  Neil Crouch, DNP, [place] [phone]

## 2022-03-14 ENCOUNTER — Ambulatory Visit: Payer: Self-pay

## 2022-03-14 ENCOUNTER — Telehealth: Payer: Self-pay

## 2022-03-14 NOTE — Telephone Encounter (Signed)
Cara from [facility] called and verbal was given for 1x week for 4 weeks for stroke education.

## 2022-03-14 NOTE — Patient Outreach (Signed)
  Care Coordination   03/14/2022 Name: SKYLOR SCHNAPP MRN: [id number] DOB: June 14, 1945   Care Coordination Outreach Attempts:  An unsuccessful telephone outreach was attempted for a scheduled appointment today.  Follow Up Plan:  Additional outreach attempts will be made to offer the patient care coordination information and services.   Encounter Outcome:  No Answer   Care Coordination Interventions:  No, not indicated    Tomasa Rand, RN, BSN, [facility] [employer] [phone]

## 2022-03-14 NOTE — Telephone Encounter (Signed)
Verbal given for speech therapy for patient for 1 x week for 8 weeks.

## 2022-03-14 NOTE — Patient Outreach (Signed)
  Care Coordination   Follow Up Visit Note   03/14/2022 Name: James Richards MRN: [id number] DOB: May 31, 1945  James Richards is a 77 y.o. year old male who sees Heaton, Thornell Mule, NP for primary care. I spoke with   James Richards  HCPOA  What matters to the patients health and wellness today?  Return call from [facility].  [name] reports patient is doing very well. Reports Speech therapy has been out and PT is coming this week.  Reports patient continues to have problems with swallowing and speech. Reports taking medications with applesauce.  Reports weight is stable at 178 pounds.  Reports patient gets tired easily.  Reports patient is sleeping well. Not using any assistive devices at this time.  [name] denies any new problems or concerns today.     Goals Addressed               This Visit's Progress     COMPLETED: Post stroke (pt-stated)        Care Coordination Interventions: Evaluation of current treatment plan related to stroke and patient's adherence to plan as established by provider Encouraged patient to continue to weigh daily and call MD for weight gain of 3 pounds over night or 5 pounds in a week Reviewed pending appointments. Reviewed and confirmed home health is active Reviewed with [name] when to call 911 for stroke symptoms. Reviewed stroke symptoms. Encouraged patient or Ms. Lumley to call MD for additional concerns.          SDOH assessments and interventions completed:  No     Care Coordination Interventions:  Yes, provided   Follow up plan: No further intervention required.   Encounter Outcome:  Pt. Visit Completed   Tomasa Rand, RN, BSN, [facility]EN [place] Coordinator [phone]

## 2022-03-18 ENCOUNTER — Telehealth: Payer: Self-pay

## 2022-03-18 NOTE — Telephone Encounter (Signed)
Mark from [facility] called and need verbal orders for patient starting 03/17/22.  1 x 1 week then 2 x 2 weeks, 1 x 1 week

## 2022-03-19 ENCOUNTER — Encounter: Payer: Self-pay | Admitting: Family Medicine

## 2022-03-19 DIAGNOSIS — I6601 Occlusion and stenosis of right middle cerebral artery: Secondary | ICD-10-CM | POA: Insufficient documentation

## 2022-03-19 NOTE — Assessment & Plan Note (Signed)
Well controlled.  No changes to medicines. Continue atorvastatin 80 mg before bed. Continue to work on eating a healthy diet and exercise.  Labs drawn today.

## 2022-03-19 NOTE — Assessment & Plan Note (Signed)
Continue losartan 50 mg daily, Lasix 40 mg as prescribed.  Long-term BP goal 130-150 given left ICA chronic occlusion

## 2022-03-19 NOTE — Assessment & Plan Note (Addendum)
Partial hemiparesis.  Secondary to stroke.  Continue home physical therapy and Occupation therapy.  Follow up with neurology.

## 2022-03-19 NOTE — Assessment & Plan Note (Signed)
Continue aspirin, plavix, and enrolled into librexia trial.

## 2022-04-01 ENCOUNTER — Emergency Department (HOSPITAL_COMMUNITY): Payer: Medicare PPO

## 2022-04-01 ENCOUNTER — Inpatient Hospital Stay (HOSPITAL_COMMUNITY)
Admission: EM | Admit: 2022-04-01 | Discharge: 2022-04-04 | DRG: 041 | Disposition: A | Discharge status: Home / Self Care | Payer: Medicare PPO | Attending: Internal Medicine | Admitting: Internal Medicine

## 2022-04-01 ENCOUNTER — Encounter (HOSPITAL_COMMUNITY): Payer: Self-pay

## 2022-04-01 ENCOUNTER — Other Ambulatory Visit: Payer: Self-pay

## 2022-04-01 DIAGNOSIS — Z87891 Personal history of nicotine dependence: Secondary | ICD-10-CM

## 2022-04-01 DIAGNOSIS — G451 Carotid artery syndrome (hemispheric): Secondary | ICD-10-CM

## 2022-04-01 DIAGNOSIS — Z8521 Personal history of malignant neoplasm of larynx: Secondary | ICD-10-CM

## 2022-04-01 DIAGNOSIS — Z809 Family history of malignant neoplasm, unspecified: Secondary | ICD-10-CM

## 2022-04-01 DIAGNOSIS — E669 Obesity, unspecified: Secondary | ICD-10-CM | POA: Diagnosis present

## 2022-04-01 DIAGNOSIS — I639 Cerebral infarction, unspecified: Secondary | ICD-10-CM

## 2022-04-01 DIAGNOSIS — I63411 Cerebral infarction due to embolism of right middle cerebral artery: Secondary | ICD-10-CM | POA: Diagnosis not present

## 2022-04-01 DIAGNOSIS — Z7902 Long term (current) use of antithrombotics/antiplatelets: Secondary | ICD-10-CM

## 2022-04-01 DIAGNOSIS — Z7951 Long term (current) use of inhaled steroids: Secondary | ICD-10-CM

## 2022-04-01 DIAGNOSIS — H538 Other visual disturbances: Secondary | ICD-10-CM | POA: Diagnosis present

## 2022-04-01 DIAGNOSIS — Z85810 Personal history of malignant neoplasm of tongue: Secondary | ICD-10-CM

## 2022-04-01 DIAGNOSIS — I4719 Other supraventricular tachycardia: Secondary | ICD-10-CM | POA: Diagnosis present

## 2022-04-01 DIAGNOSIS — I739 Peripheral vascular disease, unspecified: Secondary | ICD-10-CM | POA: Diagnosis present

## 2022-04-01 DIAGNOSIS — R4789 Other speech disturbances: Secondary | ICD-10-CM | POA: Diagnosis present

## 2022-04-01 DIAGNOSIS — R29702 NIHSS score 2: Secondary | ICD-10-CM | POA: Diagnosis present

## 2022-04-01 DIAGNOSIS — I5032 Chronic diastolic (congestive) heart failure: Secondary | ICD-10-CM | POA: Diagnosis present

## 2022-04-01 DIAGNOSIS — G8194 Hemiplegia, unspecified affecting left nondominant side: Secondary | ICD-10-CM | POA: Diagnosis present

## 2022-04-01 DIAGNOSIS — H9193 Unspecified hearing loss, bilateral: Secondary | ICD-10-CM | POA: Diagnosis present

## 2022-04-01 DIAGNOSIS — Z79890 Hormone replacement therapy (postmenopausal): Secondary | ICD-10-CM

## 2022-04-01 DIAGNOSIS — K449 Diaphragmatic hernia without obstruction or gangrene: Secondary | ICD-10-CM | POA: Diagnosis present

## 2022-04-01 DIAGNOSIS — Z7982 Long term (current) use of aspirin: Secondary | ICD-10-CM

## 2022-04-01 DIAGNOSIS — E039 Hypothyroidism, unspecified: Secondary | ICD-10-CM | POA: Diagnosis present

## 2022-04-01 DIAGNOSIS — E782 Mixed hyperlipidemia: Secondary | ICD-10-CM | POA: Diagnosis present

## 2022-04-01 DIAGNOSIS — Z79899 Other long term (current) drug therapy: Secondary | ICD-10-CM

## 2022-04-01 DIAGNOSIS — G2581 Restless legs syndrome: Secondary | ICD-10-CM | POA: Diagnosis present

## 2022-04-01 DIAGNOSIS — Z8249 Family history of ischemic heart disease and other diseases of the circulatory system: Secondary | ICD-10-CM

## 2022-04-01 DIAGNOSIS — I69354 Hemiplegia and hemiparesis following cerebral infarction affecting left non-dominant side: Secondary | ICD-10-CM

## 2022-04-01 DIAGNOSIS — K7460 Unspecified cirrhosis of liver: Secondary | ICD-10-CM | POA: Diagnosis present

## 2022-04-01 DIAGNOSIS — I110 Hypertensive heart disease with heart failure: Secondary | ICD-10-CM | POA: Diagnosis present

## 2022-04-01 DIAGNOSIS — Z6830 Body mass index (BMI) 30.0-30.9, adult: Secondary | ICD-10-CM

## 2022-04-01 LAB — DIFFERENTIAL
Abs Immature Granulocytes: 0.03 10*3/uL (ref 0.00–0.07)
Basophils Absolute: 0 10*3/uL (ref 0.0–0.1)
Basophils Relative: 1 %
Eosinophils Absolute: 0.2 10*3/uL (ref 0.0–0.5)
Eosinophils Relative: 3 %
Immature Granulocytes: 0 %
Lymphocytes Relative: 11 %
Lymphs Abs: 0.8 10*3/uL (ref 0.7–4.0)
Monocytes Absolute: 0.7 10*3/uL (ref 0.1–1.0)
Monocytes Relative: 9 %
Neutro Abs: 5.6 10*3/uL (ref 1.7–7.7)
Neutrophils Relative %: 76 %

## 2022-04-01 LAB — PROTIME-INR
INR: 1 (ref 0.8–1.2)
Prothrombin Time: 13.4 seconds (ref 11.4–15.2)

## 2022-04-01 LAB — COMPREHENSIVE METABOLIC PANEL
ALT: 31 U/L (ref 0–44)
AST: 31 U/L (ref 15–41)
Albumin: 3.5 g/dL (ref 3.5–5.0)
Alkaline Phosphatase: 78 U/L (ref 38–126)
Anion gap: 11 (ref 5–15)
BUN: 21 mg/dL (ref 8–23)
CO2: 25 mmol/L (ref 22–32)
Calcium: 9.4 mg/dL (ref 8.9–10.3)
Chloride: 106 mmol/L (ref 98–111)
Creatinine, Ser: 1.19 mg/dL (ref 0.61–1.24)
GFR, Estimated: >60 mL/min (ref >60)
Glucose, Bld: 101 mg/dL — ABNORMAL HIGH (ref 70–99)
Potassium: 4.3 mmol/L (ref 3.5–5.1)
Sodium: 142 mmol/L (ref 135–145)
Total Bilirubin: 0.4 mg/dL (ref 0.3–1.2)
Total Protein: 6.4 g/dL — ABNORMAL LOW (ref 6.5–8.1)

## 2022-04-01 LAB — I-STAT CHEM 8, ED
BUN: 25 mg/dL — ABNORMAL HIGH (ref 8–23)
Calcium, Ion: 1.13 mmol/L — ABNORMAL LOW (ref 1.15–1.40)
Chloride: 105 mmol/L (ref 98–111)
Creatinine, Ser: 1.1 mg/dL (ref 0.61–1.24)
Glucose, Bld: 97 mg/dL (ref 70–99)
HCT: 40 % (ref 39.0–52.0)
Hemoglobin: 13.6 g/dL (ref 13.0–17.0)
Potassium: 4.3 mmol/L (ref 3.5–5.1)
Sodium: 142 mmol/L (ref 135–145)
TCO2: 26 mmol/L (ref 22–32)

## 2022-04-01 LAB — CBC
HCT: 39.5 % (ref 39.0–52.0)
Hemoglobin: 13.1 g/dL (ref 13.0–17.0)
MCH: 31.8 pg (ref 26.0–34.0)
MCHC: 33.2 g/dL (ref 30.0–36.0)
MCV: 95.9 fL (ref 80.0–100.0)
Platelets: 252 10*3/uL (ref 150–400)
RBC: 4.12 MIL/uL — ABNORMAL LOW (ref 4.22–5.81)
RDW: 14.7 % (ref 11.5–15.5)
WBC: 7.4 10*3/uL (ref 4.0–10.5)
nRBC: 0 % (ref 0.0–0.2)

## 2022-04-01 LAB — APTT: aPTT: 33 seconds (ref 24–36)

## 2022-04-01 MED ORDER — CLOPIDOGREL BISULFATE 75 MG PO TABS
75.0000 mg | ORAL_TABLET | Freq: Every day | ORAL | Status: DC
  Administered 2022-04-02 – 2022-04-03 (×2): 75 mg via ORAL
  Filled 2022-04-01 (×2): qty 1

## 2022-04-01 MED ORDER — SODIUM CHLORIDE 0.9% FLUSH: 3.0000 mL | Freq: Once | INTRAVENOUS | Status: DC

## 2022-04-01 MED ORDER — ATORVASTATIN CALCIUM 80 MG PO TABS
80.0000 mg | ORAL_TABLET | Freq: Every day | ORAL | Status: DC
  Administered 2022-04-02 – 2022-04-04 (×3): 80 mg via ORAL
  Filled 2022-04-01 (×3): qty 1

## 2022-04-01 MED ORDER — ASPIRIN 81 MG PO TBEC
81.0000 mg | DELAYED_RELEASE_TABLET | Freq: Every day | ORAL | Status: DC
  Administered 2022-04-02 – 2022-04-04 (×3): 81 mg via ORAL
  Filled 2022-04-01 (×3): qty 1

## 2022-04-01 NOTE — ED Triage Notes (Addendum)
PER EMS: pt is from home with c/o sudden onset of generalized weakness, confusion and slurred speech that lasted about 30 min per wife. She states this is the 3rd TIA this week. He had a recent ischemic stroke 2 weeks ago with embolectomy. Pt is currently A&OX4, no other symptoms at this time.  BP156/88, HR:60-120 afib, no hx of afib,  O2-98% RA, CBG-123 18g LAC

## 2022-04-01 NOTE — ED Provider Notes (Signed)
[facility] Provider Note   CSN: [id number] Arrival date & time: 04/01/22  1717     History  Chief Complaint  Patient presents with   Transient Ischemic Attack    James Richards is a 77 y.o. male with history of smoking, coronary disease, hypertension, hyperlipidemia, recent hospitalization for stroke, on aspirin and Plavix, presenting to the ED with concern for confusion and speech difficulty.  The patient was just discharged in the hospital 03/08/22 per my review of external records after stroke 2 weeks to acute right MCA infarct and right M2 calcified embolus.  On arrival at that time he was found to have a right gaze preference, left arm weakness and left leg weakness and slurred speech and left hemianopsia.  He was discharged in the hospital antiplatelet therapy with noted ongoing dysarthria and residual weakness of the left side, improved from admission.  He lives with his power of attorney  He presents back to the ED today by EMS with concern for transient episodes of slurred speech confusion, 3 episodes which occurred this week, including today lasting about 30 minutes.  The patient reports that they felt like "mini strokes".  He feels back to baseline.  Per EMS report the patient was "back to his baseline status".  I have not been able to reach his family contact to confirm the history noted at home.  HPI     Home Medications Prior to Admission medications   Medication Sig Start Date End Date Taking? Authorizing Provider  acetaminophen-codeine (TYLENOL #3) 300-30 MG tablet TAKE 1 TABLET EVERY 4 HOURS AS NEEDED FOR MODERATE PAIN Patient taking differently: Take 1 tablet by mouth every 4 (four) hours as needed for moderate pain or severe pain. 07/07/21   Rip Harbour, NP  albuterol (VENTOLIN HFA) 108 (90 Base) MCG/ACT inhaler Inhale 2 puffs into the lungs every 6 (six) hours as needed for wheezing or shortness of breath. 07/20/21    [provider]  ascorbic acid (VITAMIN C) 500 MG tablet Take 500 mg by mouth daily.    [provider]  aspirin EC 81 MG tablet Take 81 mg by mouth daily.    [provider]  atorvastatin (LIPITOR) 80 MG tablet TAKE 1 TABLET EVERY DAY Patient taking differently: Take 80 mg by mouth daily. 02/16/22   Cox, Elnita Maxwell, MD  CINNAMON PO Take 1 capsule by mouth daily.    [provider]  clopidogrel (PLAVIX) 75 MG tablet Take 1 tablet (75 mg total) by mouth daily. 09/28/21   Lillard Anes, MD  furosemide (LASIX) 40 MG tablet Take 1 tablet (40 mg total) by mouth every other day. Patient taking differently: Take 40 mg by mouth daily as needed for fluid or edema. 04/22/21   Lillard Anes, MD  GARLIC PO Take [id number] mg by mouth daily.    [provider]  Ginkgo Biloba (GINKOBA PO) Take 120 mg by mouth daily.    [provider]  levothyroxine (SYNTHROID) 125 MCG tablet Take 1 tablet (125 mcg total) by mouth daily. 03/03/22   Rip Harbour, NP  losartan (COZAAR) 50 MG tablet Take 1 tablet (50 mg total) by mouth daily. 12/27/21   Rip Harbour, NP  minocycline (MINOCIN) 50 MG capsule Take 1 capsule (50 mg total) by mouth daily. Patient taking differently: Take 50 mg by mouth every 12 (twelve) hours. 09/28/21   Lillard Anes, MD  Omega-3 Fatty Acids ([place])  1200 MG CPDR Take 1 capsule by mouth daily.    [provider]  pantoprazole (PROTONIX) 40 MG tablet Take 1 tablet (40 mg total) by mouth daily. 03/08/22   Rosalin Hawking, MD  Potassium Bicarbonate 99 MG CAPS Take 99 mg by mouth daily.    [provider]  rOPINIRole (REQUIP) 2 MG tablet Take 2 mg by mouth at bedtime as needed (restless legs). 02/22/22   [provider]  Study Samuel Jester - milvexian 25 mg or placebo tablet (PI-Sethi) Take 1 tablet by mouth 2 (two) times daily. For Investigational Use Only. Take with or without food at approximately the  same time each day. 03/08/22   Rosalin Hawking, MD  SYMBICORT 160-4.5 MCG/ACT inhaler Inhale 2 puffs into the lungs daily. 12/21/21   [provider]  vitamin B-12 (CYANOCOBALAMIN) 500 MCG tablet Take 500 mcg by mouth daily.    [provider]  zinc gluconate 50 MG tablet Take 50 mg by mouth daily.    [provider]      Allergies    Patient has no known allergies.    Review of Systems   Review of Systems  Physical Exam Updated Vital Signs BP (!) 152/94   Pulse 89   Temp 98.2 F (36.8 C) (Oral)   Resp 18   Ht 5' 5"$  (1.651 m)   Wt 80.7 kg   SpO2 97%   BMI 29.62 kg/m  Physical Exam Constitutional:      General: He is not in acute distress. HENT:     Head: Normocephalic and atraumatic.  Eyes:     Conjunctiva/sclera: Conjunctivae normal.     Pupils: Pupils are equal, round, and reactive to light.  Cardiovascular:     Rate and Rhythm: Normal rate and regular rhythm.  Pulmonary:     Effort: Pulmonary effort is normal. No respiratory distress.  Abdominal:     General: There is no distension.     Tenderness: There is no abdominal tenderness.  Skin:    General: Skin is warm and dry.  Neurological:     Mental Status: He is alert. Mental status is at baseline.     Comments: Mildly slurred speech, no evident facial droop.  Patient is fully oriented.  No gaze defects noted.  No hemineglect.  There is some mild weakness to the left grip strength compared to the right.  Negative pronator drift.  Psychiatric:        Mood and Affect: Mood normal.        Behavior: Behavior normal.     ED Results / Procedures / Treatments   Labs (all labs ordered are listed, but only abnormal results are displayed) Labs Reviewed  CBC - Abnormal; Notable for the following components:      Result Value   RBC 4.12 (*)    All other components within normal limits  COMPREHENSIVE METABOLIC PANEL - Abnormal; Notable for the following components:   Glucose, Bld 101 (*)    Total  Protein 6.4 (*)    All other components within normal limits  I-STAT CHEM 8, ED - Abnormal; Notable for the following components:   BUN 25 (*)    Calcium, Ion 1.13 (*)    All other components within normal limits  PROTIME-INR  APTT  DIFFERENTIAL  ETHANOL  CBG MONITORING, ED    EKG EKG Interpretation  Date/Time:  Friday April 01 2022 17:29:52 EST Ventricular Rate:  69 PR Interval:  229 QRS Duration: 79 QT  Interval:  389 QTC Calculation: 417 R Axis:   -46 Text Interpretation: Sinus rhythm Atrial premature complexes in couplets Prolonged PR interval Left anterior fascicular block Borderline low voltage, extremity leads Confirmed by Octaviano Glow [phone]) on 04/01/2022 6:52:41 PM  Radiology No results found.  Procedures Procedures    Medications Ordered in ED Medications  sodium chloride flush (NS) 0.9 % injection 3 mL (3 mLs Intravenous Not Given 04/01/22 1907)    ED Course/ Medical Decision Making/ A&P Clinical Course as of 04/01/22 2316  Fri Apr 01, 2022  1730 No answer from [name] his PoA by phone. [MT]  2055 Dr Lorrin Goodell from neurology recommending MRI brain and stat EEG.  Patient reassessed and remains at his baseline mental status, no acute complaints. [MT]  2224 His cousin [name] now answers phone, telling me patient was complaining of confusion throughout the day, no visible neuro deficits.  However, she reports 2 weeks ago his symptoms involved loss of feeling in his face. [MT]  2301 Radiologist reports multiple metabolic strokes on MRI.  Will discuss with neurology and admit the patient [MT]  2309 Neurology consulted, recommending repeat angiogram imaging [MT]  2311 Family updated by phone [MT]    Clinical Course User Index [MT] Chrishauna Mee, Carola Rhine, MD                             Medical Decision Making Amount and/or Complexity of Data Reviewed Labs: ordered. Radiology: ordered.  Risk Decision regarding hospitalization.   This patient  presents to the ED with concern for transient episodes of dysarthria, confusion,. This involves an extensive number of treatment options, and is a complaint that carries with it a high risk of complications and morbidity.  The differential diagnosis includes TIA versus recrudescence of prior stroke symptoms versus other  Co-morbidities that complicate the patient evaluation: Multiple CVA risk factors  Additional history obtained from EMS, sister and PoA  External records from outside source obtained and reviewed including but records and discharge summary from January  I ordered and personally interpreted labs.  The pertinent results include: No emergent findings  I ordered imaging studies including CT image of the head, MRI brain I independently visualized and interpreted imaging which showed multiple acute new infarcts concerning for embolic event I agree with the radiologist interpretation  The patient was maintained on a cardiac monitor.  I personally viewed and interpreted the cardiac monitored which showed an underlying rhythm of: Sinus rhythm with PACs  Per my interpretation the patient's ECG shows sinus rhythm with PACs  I have reviewed the patients home medicines and have made adjustments as needed   I requested consultation with the neurology,  and discussed lab and imaging findings as well as pertinent plan - they recommend: Repeat angiogram imaging.  Full neurology consultation to follow  After the interventions noted above, I reevaluated the patient and found that they have: stayed the same  Dispostion:  After consideration of the diagnostic results and the patients response to treatment, I feel that the patent would benefit from medical admission         Final Clinical Impression(s) / ED Diagnoses Final diagnoses:  Cerebrovascular accident (CVA), unspecified mechanism [facility])    Rx / DC Orders ED Discharge Orders     None         Wyvonnia Dusky,  MD 04/01/22 2316

## 2022-04-01 NOTE — ED Notes (Signed)
Patient transported to CT 

## 2022-04-02 ENCOUNTER — Inpatient Hospital Stay (HOSPITAL_COMMUNITY): Payer: Medicare PPO

## 2022-04-02 DIAGNOSIS — K449 Diaphragmatic hernia without obstruction or gangrene: Secondary | ICD-10-CM | POA: Diagnosis present

## 2022-04-02 DIAGNOSIS — R4789 Other speech disturbances: Secondary | ICD-10-CM | POA: Diagnosis present

## 2022-04-02 DIAGNOSIS — Z6830 Body mass index (BMI) 30.0-30.9, adult: Secondary | ICD-10-CM | POA: Diagnosis not present

## 2022-04-02 DIAGNOSIS — G8194 Hemiplegia, unspecified affecting left nondominant side: Secondary | ICD-10-CM | POA: Diagnosis not present

## 2022-04-02 DIAGNOSIS — H538 Other visual disturbances: Secondary | ICD-10-CM | POA: Diagnosis present

## 2022-04-02 DIAGNOSIS — G2581 Restless legs syndrome: Secondary | ICD-10-CM | POA: Diagnosis present

## 2022-04-02 DIAGNOSIS — K7460 Unspecified cirrhosis of liver: Secondary | ICD-10-CM | POA: Diagnosis present

## 2022-04-02 DIAGNOSIS — Z85810 Personal history of malignant neoplasm of tongue: Secondary | ICD-10-CM | POA: Diagnosis not present

## 2022-04-02 DIAGNOSIS — I69354 Hemiplegia and hemiparesis following cerebral infarction affecting left non-dominant side: Secondary | ICD-10-CM | POA: Diagnosis not present

## 2022-04-02 DIAGNOSIS — I4719 Other supraventricular tachycardia: Secondary | ICD-10-CM | POA: Diagnosis present

## 2022-04-02 DIAGNOSIS — R4182 Altered mental status, unspecified: Secondary | ICD-10-CM | POA: Diagnosis not present

## 2022-04-02 DIAGNOSIS — Z87891 Personal history of nicotine dependence: Secondary | ICD-10-CM | POA: Diagnosis not present

## 2022-04-02 DIAGNOSIS — I63411 Cerebral infarction due to embolism of right middle cerebral artery: Secondary | ICD-10-CM | POA: Diagnosis present

## 2022-04-02 DIAGNOSIS — Z7982 Long term (current) use of aspirin: Secondary | ICD-10-CM | POA: Diagnosis not present

## 2022-04-02 DIAGNOSIS — I110 Hypertensive heart disease with heart failure: Secondary | ICD-10-CM | POA: Diagnosis present

## 2022-04-02 DIAGNOSIS — I471 Supraventricular tachycardia, unspecified: Secondary | ICD-10-CM | POA: Diagnosis not present

## 2022-04-02 DIAGNOSIS — I5032 Chronic diastolic (congestive) heart failure: Secondary | ICD-10-CM | POA: Diagnosis present

## 2022-04-02 DIAGNOSIS — Z79899 Other long term (current) drug therapy: Secondary | ICD-10-CM | POA: Diagnosis not present

## 2022-04-02 DIAGNOSIS — E039 Hypothyroidism, unspecified: Secondary | ICD-10-CM | POA: Diagnosis present

## 2022-04-02 DIAGNOSIS — I639 Cerebral infarction, unspecified: Secondary | ICD-10-CM | POA: Diagnosis present

## 2022-04-02 DIAGNOSIS — E669 Obesity, unspecified: Secondary | ICD-10-CM | POA: Diagnosis present

## 2022-04-02 DIAGNOSIS — I739 Peripheral vascular disease, unspecified: Secondary | ICD-10-CM | POA: Diagnosis present

## 2022-04-02 DIAGNOSIS — Z809 Family history of malignant neoplasm, unspecified: Secondary | ICD-10-CM | POA: Diagnosis not present

## 2022-04-02 DIAGNOSIS — Z79890 Hormone replacement therapy (postmenopausal): Secondary | ICD-10-CM | POA: Diagnosis not present

## 2022-04-02 DIAGNOSIS — Z8521 Personal history of malignant neoplasm of larynx: Secondary | ICD-10-CM | POA: Diagnosis not present

## 2022-04-02 DIAGNOSIS — Z7902 Long term (current) use of antithrombotics/antiplatelets: Secondary | ICD-10-CM | POA: Diagnosis not present

## 2022-04-02 DIAGNOSIS — Z8249 Family history of ischemic heart disease and other diseases of the circulatory system: Secondary | ICD-10-CM | POA: Diagnosis not present

## 2022-04-02 DIAGNOSIS — E782 Mixed hyperlipidemia: Secondary | ICD-10-CM | POA: Diagnosis present

## 2022-04-02 LAB — LIPID PANEL
Cholesterol: 98 mg/dL (ref 0–200)
HDL: 46 mg/dL (ref >40)
LDL Cholesterol: 43 mg/dL (ref 0–99)
Total CHOL/HDL Ratio: 2.1 RATIO
Triglycerides: 45 mg/dL (ref <150)
VLDL: 9 mg/dL (ref 0–40)

## 2022-04-02 LAB — ETHANOL: Alcohol, Ethyl (B): <10 mg/dL (ref <10)

## 2022-04-02 MED ORDER — ENOXAPARIN SODIUM 40 MG/0.4ML IJ SOSY
40.0000 mg | PREFILLED_SYRINGE | INTRAMUSCULAR | Status: DC
  Administered 2022-04-02 – 2022-04-04 (×3): 40 mg via SUBCUTANEOUS
  Filled 2022-04-02 (×3): qty 0.4

## 2022-04-02 MED ORDER — ACETAMINOPHEN 160 MG/5ML PO SOLN: 650.0000 mg | ORAL | Status: DC | PRN

## 2022-04-02 MED ORDER — ACETAMINOPHEN 325 MG PO TABS: 650.0000 mg | ORAL_TABLET | ORAL | Status: DC | PRN

## 2022-04-02 MED ORDER — STROKE: EARLY STAGES OF RECOVERY BOOK
Freq: Once | Status: AC
  Filled 2022-04-02: qty 1

## 2022-04-02 MED ORDER — IOHEXOL 350 MG/ML SOLN
75.0000 mL | Freq: Once | INTRAVENOUS | Status: AC | PRN
  Administered 2022-04-02: 75 mL via INTRAVENOUS

## 2022-04-02 MED ORDER — ACETAMINOPHEN 650 MG RE SUPP: 650.0000 mg | RECTAL | Status: DC | PRN

## 2022-04-02 MED ORDER — IOHEXOL 350 MG/ML SOLN
100.0000 mL | Freq: Once | INTRAVENOUS | Status: AC | PRN
  Administered 2022-04-02: 100 mL via INTRAVENOUS

## 2022-04-02 NOTE — Evaluation (Signed)
Clinical/Bedside Swallow Evaluation Patient Details  Name: James Richards MRN: [id number] Date of Birth: [birth year]  Today's Date: 04/02/2022 Time: SLP Start Time (ACUTE ONLY): 1250 SLP Stop Time (ACUTE ONLY): [id number] SLP Time Calculation (min) (ACUTE ONLY): 13 min  Past Medical History:  Past Medical History:  Diagnosis Date   Atherosclerosis of native arteries of the extremities with intermittent claudication 10/09/2013   Atherosclerotic peripheral vascular disease with intermittent claudication ([place]) 10/03/2012   BMI 33.0-33.9,adult 07/07/2019   Carotid stenosis [id number]   Diastolic dysfunction [id number]   Essential hypertension    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   History of recurrent TIAs 12/11/2014   Impacted cerumen 11/26/2019   Malignant neoplasm of base of tongue ([place]) 01/21/2015   Malignant neoplasm of larynx, unspecified ([facility])    Migraine 04/02/2014   Mixed hyperlipidemia    Other fatigue    Other specified hypothyroidism    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    Restless legs syndrome    Restrictive lung disease 11/26/2019   TIA (transient ischemic attack) 11/24/2014   Vision exam with abnormal findings concern for left retinal artery occlusion 03/29/2021   Past Surgical History:  Past Surgical History:  Procedure Laterality Date   IR CT HEAD LTD  03/06/2022   IR PERCUTANEOUS ART THROMBECTOMY/INFUSION INTRACRANIAL INC DIAG ANGIO  03/06/2022   LYMPHADENECTOMY     PERIPHERAL VASCULAR CATHETERIZATION N/A 11/24/2014   Procedure: Aortic Arch Angiography;  Surgeon: Angelia Mould, MD;  Location: [facility];  Service: Cardiovascular;  Laterality: N/A;   PERIPHERAL VASCULAR CATHETERIZATION N/A 11/24/2014   Procedure: Cerebral Angiography;  Surgeon: Angelia Mould, MD;  Location: [facility];  Service: Cardiovascular;  Laterality: N/A;   RADIOLOGY WITH ANESTHESIA N/A 03/06/2022   Procedure: RADIOLOGY WITH  ANESTHESIA;  Surgeon: Radiologist, Medication, MD;  Location: [facility];  Service: Radiology;  Laterality: N/A;   THROAT SURGERY     TONSILECTOMY/ADENOIDECTOMY WITH MYRINGOTOMY     HPI:  Patient is a 77 yo male presenting to the ED with sudden onset of generalized weakness, confusion and slurred speech that lasted about 30 min per wife on 04/01/22. She states this is the 3rd TIA this week. He had a recent ischemic stroke 2 weeks ago with embolectomy. MRI on 04/01/22 showing: scattered foci of restricted diffusion in the bilateral cerebral hemispheres, as well as in the right caudate head and left external  Capsule and Evolving subacute, evolving infarct in the posterior right frontal and anterior right parietal cortex. 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  Pt has a hx of cognitive-linguistic deficits and dysphagia with most recent evaluations completed on 03/07/22.  MRI remarkable for "Evolving subacute, evolving infarct in the posterior right  frontal and anterior right parietal cortex."    Assessment / Plan / Recommendation  Clinical Impression  Pt was seen for a bedside swallow evaluation and presents with oral dysphagia with suspected functional pharyngeal phase of the swallow.  Pt has reduced lingual ROM secondary to lingual resection in the setting of remote lingual cancer.  Subsequently, he exhibited mild-moderate oral residue on the R side with regular solids and required a cued liquid wash x2 to clear.  Mastication was mildly prolonged with top and bottom dentures in place.  No overt s/sx of aspiration were observed during this evaluation; however, RN reported choking incident this  morning when pt did not have dentures in place.  Wife stated that she prepares softer meals for the pt at baseline.  Recommend initiation of Dysphagia 2 (fine chop)  solids and thin liquids with medication administered whole in puree.  SLP Visit Diagnosis: Dysphagia, unspecified (R13.10)    Aspiration Risk  Mild aspiration risk    Diet Recommendation Dysphagia 2 (Fine chop);Thin liquid   Liquid Administration via: Cup;Straw Medication Administration: Whole meds with puree Supervision: Patient able to self feed;Full supervision/cueing for compensatory strategies Compensations: Minimize environmental distractions;Slow rate;Small sips/bites Postural Changes: Seated upright at 90 degrees    Other  Recommendations Oral Care Recommendations: Oral care BID    Recommendations for follow up therapy are one component of a multi-disciplinary discharge planning process, led by the attending physician.  Recommendations may be updated based on patient status, additional functional criteria and insurance authorization.  Follow up Recommendations No SLP follow up      Assistance Recommended at Discharge    Functional Status Assessment Patient has had a recent decline in their functional status and demonstrates the ability to make significant improvements in function in a reasonable and predictable amount of time.  Frequency and Duration min 2x/week  2 weeks       Prognosis Prognosis for improved oropharyngeal function: Good Barriers to Reach Goals: Cognitive deficits      Swallow Study   General Date of Onset: 04/01/22 HPI: Patient is a 77 yo male presenting to the ED with sudden onset of generalized weakness, confusion and slurred speech that lasted about 30 min per wife on 04/01/22. She states this is the 3rd TIA this week. He had a recent ischemic stroke 2 weeks ago with embolectomy. MRI on 04/01/22 showing: scattered foci of restricted diffusion in the bilateral cerebral hemispheres, as well as in the right caudate head and left external  Capsule and Evolving subacute, evolving infarct in the posterior right frontal and anterior right parietal cortex. 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  Pt has a hx of cognitive-linguistic deficits and dysphagia with most recent evaluations completed on 03/07/22.  MRI remarkable for "Evolving subacute, evolving infarct in the posterior right  frontal and anterior right parietal cortex." Type of Study: Bedside Swallow Evaluation Previous Swallow Assessment: see HPI Diet Prior to this Study: NPO Temperature Spikes Noted: No Respiratory Status: Room air History of Recent Intubation: No Behavior/Cognition: Alert;Cooperative;Pleasant mood Oral Cavity Assessment: Within Functional Limits Oral Care Completed by SLP: No Oral Cavity - Dentition: Dentures, bottom;Dentures, top Vision: Functional for self-feeding Self-Feeding Abilities: Able to feed self;Needs set up Patient Positioning: Upright in bed Baseline Vocal Quality: Normal Volitional Cough: Strong Volitional Swallow: Able to elicit    Oral/Motor/Sensory Function Overall Oral Motor/Sensory Function: Mild impairment Facial ROM: Within Functional Limits Facial Symmetry: Within Functional Limits Lingual ROM: Reduced right;Reduced left   Ice Chips Ice chips: Not tested   Thin Liquid Thin Liquid: Within functional limits Presentation: Straw    Nectar Thick Nectar Thick Liquid: Not tested   Honey Thick Honey Thick Liquid: Not tested   Puree Puree: Not tested   Solid     Solid: Impaired Presentation: Self Fed Oral Phase Impairments: Impaired mastication Oral Phase Functional Implications: Prolonged oral transit;Oral residue     Bretta Bang, M.S., [facility] Office: [phone]  Elvia Collum Verdie Barrows 04/02/2022,2:01 PM

## 2022-04-02 NOTE — Progress Notes (Signed)
EEG complete - results pending 

## 2022-04-02 NOTE — ED Notes (Signed)
ED TO INPATIENT HANDOFF REPORT  ED Nurse Name and Phone #: Lysle Rubens RN [phone]  S Name/Age/Gender James Richards 77 y.o. male Room/Bed: 008C/008C  Code Status   Code Status: Full Code  Home/SNF/Other Home Patient oriented to: self, place, time, and situation Is this baseline? Yes   Triage Complete: Triage complete  Chief Complaint Acute embolic stroke [facility]) [id number]  Triage Note PER EMS: pt is from home with c/o sudden onset of generalized weakness, confusion and slurred speech that lasted about 30 min per wife. She states this is the 3rd TIA this week. He had a recent ischemic stroke 2 weeks ago with embolectomy. Pt is currently A&OX4, no other symptoms at this time.  BP156/88, HR:60-120 afib, no hx of afib,  O2-98% RA, CBG-123 18g LAC   Allergies No Known Allergies  Level of Care/Admitting Diagnosis ED Disposition     ED Disposition  Admit   Condition  --   Comment  Hospital Area: [place] [[id number]]  Level of Care: Telemetry Medical [104]  May admit patient to [facility] or [facility] if equivalent level of care is available:: No  Covid Evaluation: Asymptomatic - no recent exposure (last 10 days) testing not required  Diagnosis: Acute embolic stroke [facility]) [id number]  Admitting Physician: Etta Quill [[id number]]  Attending Physician: Etta Quill [id number]  Certification:: I certify this patient will need inpatient services for at least 2 midnights  Estimated Length of Stay: 3          B Medical/Surgery History Past Medical History:  Diagnosis Date   Atherosclerosis of native arteries of the extremities with intermittent claudication 10/09/2013   Atherosclerotic peripheral vascular disease with intermittent claudication ([place]) 10/03/2012   BMI 33.0-33.9,adult 07/07/2019   Carotid stenosis 123XX123   Diastolic dysfunction [id number]   Essential hypertension    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   History of recurrent TIAs  12/11/2014   Impacted cerumen 11/26/2019   Malignant neoplasm of base of tongue ([place]) 01/21/2015   Malignant neoplasm of larynx, unspecified ([place])    Migraine 04/02/2014   Mixed hyperlipidemia    Other fatigue    Other specified hypothyroidism    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    Restless legs syndrome    Restrictive lung disease 11/26/2019   TIA (transient ischemic attack) 11/24/2014   Vision exam with abnormal findings concern for left retinal artery occlusion 03/29/2021   Past Surgical History:  Procedure Laterality Date   IR CT HEAD LTD  03/06/2022   IR PERCUTANEOUS ART THROMBECTOMY/INFUSION INTRACRANIAL INC DIAG ANGIO  03/06/2022   LYMPHADENECTOMY     PERIPHERAL VASCULAR CATHETERIZATION N/A 11/24/2014   Procedure: Aortic Arch Angiography;  Surgeon: Angelia Mould, MD;  Location: [place] [facility];  Service: Cardiovascular;  Laterality: N/A;   PERIPHERAL VASCULAR CATHETERIZATION N/A 11/24/2014   Procedure: Cerebral Angiography;  Surgeon: Angelia Mould, MD;  Location: [place] [facility];  Service: Cardiovascular;  Laterality: N/A;   RADIOLOGY WITH ANESTHESIA N/A 03/06/2022   Procedure: RADIOLOGY WITH ANESTHESIA;  Surgeon: Radiologist, Medication, MD;  Location: [place];  Service: Radiology;  Laterality: N/A;   THROAT SURGERY     TONSILECTOMY/ADENOIDECTOMY WITH MYRINGOTOMY       A IV Location/Drains/Wounds Patient Lines/Drains/Airways Status     Active Line/Drains/Airways     Name Placement date Placement time Site Days   Peripheral IV 04/01/22 20 G Left Antecubital 04/01/22  1730  Antecubital  1  Intake/Output Last 24 hours No intake or output data in the 24 hours ending 04/02/22 0035  Labs/Imaging Results for orders placed or performed during the hospital encounter of 04/01/22 (from the past 48 hour(s))  Protime-INR     Status: None   Collection Time: 04/01/22  5:55 PM  Result Value Ref Range   Prothrombin Time  13.4 11.4 - 15.2 seconds   INR 1.0 0.8 - 1.2    Comment: (NOTE) INR goal varies based on device and disease states. Performed at [facility], [place] [address] [place], [place] [address]   APTT     Status: None   Collection Time: 04/01/22  5:55 PM  Result Value Ref Range   aPTT 33 24 - 36 seconds    Comment: Performed at [facility] [address], [place], [place] [address]  CBC     Status: Abnormal   Collection Time: 04/01/22  5:55 PM  Result Value Ref Range   WBC 7.4 4.0 - 10.5 K/uL   RBC 4.12 (L) 4.22 - 5.81 MIL/uL   Hemoglobin 13.1 13.0 - 17.0 g/dL   HCT 39.5 39.0 - 52.0 %   MCV 95.9 80.0 - 100.0 fL   MCH 31.8 26.0 - 34.0 pg   MCHC 33.2 30.0 - 36.0 g/dL   RDW 14.7 11.5 - 15.5 %   Platelets 252 150 - 400 K/uL   nRBC 0.0 0.0 - 0.2 %    Comment: Performed at [facility], [place] [address] [place], [place] [address]  Differential     Status: None   Collection Time: 04/01/22  5:55 PM  Result Value Ref Range   Neutrophils Relative % 76 %   Neutro Abs 5.6 1.7 - 7.7 K/uL   Lymphocytes Relative 11 %   Lymphs Abs 0.8 0.7 - 4.0 K/uL   Monocytes Relative 9 %   Monocytes Absolute 0.7 0.1 - 1.0 K/uL   Eosinophils Relative 3 %   Eosinophils Absolute 0.2 0.0 - 0.5 K/uL   Basophils Relative 1 %   Basophils Absolute 0.0 0.0 - 0.1 K/uL   Immature Granulocytes 0 %   Abs Immature Granulocytes 0.03 0.00 - 0.07 K/uL    Comment: Performed at [facility], [place] [address] [place], [place] [address]  Comprehensive metabolic panel     Status: Abnormal   Collection Time: 04/01/22  5:55 PM  Result Value Ref Range   Sodium 142 135 - 145 mmol/L   Potassium 4.3 3.5 - 5.1 mmol/L   Chloride 106 98 - 111 mmol/L   CO2 25 22 - 32 mmol/L   Glucose, Bld 101 (H) 70 - 99 mg/dL    Comment: Glucose reference range applies only to samples taken after fasting for at least 8 hours.   BUN 21 8 - 23 mg/dL   Creatinine, Ser 1.19 0.61 - 1.24 mg/dL   Calcium 9.4 8.9 - 10.3 mg/dL    Total Protein 6.4 (L) 6.5 - 8.1 g/dL   Albumin 3.5 3.5 - 5.0 g/dL   AST 31 15 - 41 U/L   ALT 31 0 - 44 U/L   Alkaline Phosphatase 78 38 - 126 U/L   Total Bilirubin 0.4 0.3 - 1.2 mg/dL   GFR, Estimated >60 >60 mL/min    Comment: (NOTE) Calculated using the CKD-EPI Creatinine Equation (2021)    Anion gap 11 5 - 15    Comment: Performed at [facility] [address]  [place], [place] [address]  I-stat chem 8, ED     Status: Abnormal   Collection Time: 04/01/22  5:58 PM  Result Value Ref Range   Sodium 142 135 - 145 mmol/L   Potassium 4.3 3.5 - 5.1 mmol/L   Chloride 105 98 - 111 mmol/L   BUN 25 (H) 8 - 23 mg/dL   Creatinine, Ser 1.10 0.61 - 1.24 mg/dL   Glucose, Bld 97 70 - 99 mg/dL    Comment: Glucose reference range applies only to samples taken after fasting for at least 8 hours.   Calcium, Ion 1.13 (L) 1.15 - 1.40 mmol/L   TCO2 26 22 - 32 mmol/L   Hemoglobin 13.6 13.0 - 17.0 g/dL   HCT 40.0 39.0 - 52.0 %   No results found.  Pending Labs Unresulted Labs (From admission, onward)     Start     Ordered   04/02/22 0500  Lipid panel  (Labs)  Tomorrow morning,   R       Comments: Fasting    04/02/22 0022   04/01/22 1727  Ethanol  (Stroke Panel (PNL))  Once,   URGENT        04/01/22 1730            Vitals/Pain Today's Vitals   04/01/22 1730 04/01/22 1841 04/01/22 2012 04/01/22 2300  BP: (!) 173/105  (!) 152/94 (!) 116/96  Pulse: 72 74 89 85  Resp: (!) 25 (!) 24 18 (!) 22  Temp:      TempSrc:      SpO2: 100% 97% 97% 99%  Weight:      Height:      PainSc:        Isolation Precautions No active isolations  Medications Medications  sodium chloride flush (NS) 0.9 % injection 3 mL (3 mLs Intravenous Not Given 04/01/22 1907)  aspirin EC tablet 81 mg (has no administration in time range)  clopidogrel (PLAVIX) tablet 75 mg (has no administration in time range)  atorvastatin (LIPITOR) tablet 80 mg (has no administration in time range)   stroke: early stages  of recovery book (has no administration in time range)  acetaminophen (TYLENOL) tablet 650 mg (has no administration in time range)    Or  acetaminophen (TYLENOL) 160 MG/5ML solution 650 mg (has no administration in time range)    Or  acetaminophen (TYLENOL) suppository 650 mg (has no administration in time range)  iohexol (OMNIPAQUE) 350 MG/ML injection 75 mL (75 mLs Intravenous Contrast Given 04/02/22 0005)    Mobility walks     Focused Assessments Cardiac Assessment Handoff:    No results found for: "CKTOTAL", "CKMB", "CKMBINDEX", "TROPONINI" Lab Results  Component Value Date   DDIMER 0.34 12/02/2019   Does the Patient currently have chest pain? No   , Neuro Assessment Handoff:  Swallow screen pass? Yes    NIH Stroke Scale  Dizziness Present: No Headache Present: No Interval: Initial Level of Consciousness (1a.)   : Alert, keenly responsive LOC Questions (1b. )   : Answers both questions correctly LOC Commands (1c. )   : Performs both tasks correctly Best Gaze (2. )  : Normal Visual (3. )  : No visual loss Facial Palsy (4. )    : Normal symmetrical movements Motor Arm, Left (5a. )   : No drift Motor Arm, Right (5b. ) : No drift Motor Leg, Left (6a. )  : No drift Motor Leg, Right (6b. ) : No drift Limb Ataxia (7. ): Absent  Sensory (8. )  : Normal, no sensory loss Best Language (9. )  : No aphasia Dysarthria (10. ): Normal Extinction/Inattention (11.)   : No Abnormality Complete NIHSS TOTAL: 0     Neuro Assessment: Exceptions to WDL Neuro Checks:   Initial (04/01/22 1800)  Has TPA been given? Yes Temp: 98.2 F (36.8 C) (02/16 1724) Temp Source: Oral (02/16 1724) BP: 116/96 (02/16 2300) Pulse Rate: 85 (02/16 2300) If patient is a Neuro Trauma and patient is going to OR before floor call report to [place] nurse: [phone] or [phone]  , Renal Assessment Handoff:  Hemodialysis Schedule:  Last Hemodialysis date and time:    Restricted appendage:     , Pulmonary Assessment Handoff:  Lung sounds:   O2 Device: Room Air      R Recommendations: See Admitting Provider Note  Report given to:   Additional Notes:

## 2022-04-02 NOTE — Evaluation (Signed)
Occupational Therapy Evaluation Patient Details Name: James Richards MRN: [id number] DOB: 07-Apr-1945 Today's Date: 04/02/2022   History of Present Illness Patient is a 77 yo male presenting to the ED with sudden onset of generalized weakness, confusion and slurred speech that lasted about 30 min per wife on 04/01/22. She states this is the 3rd TIA this week. He had a recent ischemic stroke 2 weeks ago with embolectomy. MRI on 04/01/22 showing: scattered foci of restricted diffusion in the bilateral cerebral hemispheres, as well as in the right caudate head and left external  Capsule and Evolving subacute, evolving infarct in the posterior right frontal and anterior right parietal cortex. 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   Clinical Impression   Prior to this admission, patient was living with his cousin in a house with 2-3 STE and was independent without AD for functional mobility but appearing to be receiving assistance for cognitive tasks, like managing his medications. Currently, patient is min A for ADLs, and with noted cognitive impairments (though this may be baseline). Patient is low vision at baseline, with inaccurate visual assessment completed due to patient joking and laughing. OT will continue to follow acutely, but currently does not need additional therapy at home but highly endorse 24/7 supervision due to cognitive status.      Recommendations for follow up therapy are one component of a multi-disciplinary discharge planning process, led by the attending physician.  Recommendations may be updated based on patient status, additional functional criteria and insurance authorization.   Follow Up Recommendations  No OT follow up     Assistance Recommended at Discharge Frequent or constant Supervision/Assistance (24/7  supervision)  Patient can return home with the following A little help with walking and/or transfers;A little help with bathing/dressing/bathroom;Assistance with cooking/housework;Direct supervision/assist for medications management;Direct supervision/assist for financial management;Assist for transportation;Help with stairs or ramp for entrance    Functional Status Assessment  Patient has had a recent decline in their functional status and demonstrates the ability to make significant improvements in function in a reasonable and predictable amount of time.  Equipment Recommendations  None recommended by OT    Recommendations for Other Services       Precautions / Restrictions Precautions Precautions: Fall (low) Restrictions Weight Bearing Restrictions: No      Mobility Bed Mobility Overal bed mobility: Modified Independent Bed Mobility: Supine to Sit     Supine to sit: Modified independent (Device/Increase time), HOB elevated     General bed mobility comments: No assistance needed    Transfers Overall transfer level: Needs assistance Equipment used: None Transfers: Sit to/from Stand Sit to Stand: Supervision, +2 safety/equipment           General transfer comment: Supervision for safety, but comes to stand quickly without LOB      Balance Overall balance assessment: Mild deficits observed, not formally tested                                         ADL either performed or assessed with clinical judgement   ADL Overall ADL's : Needs assistance/impaired Eating/Feeding: Set up;Supervision/ safety   Grooming: Wash/dry hands;Wash/dry face;Set up;Sitting   Upper Body Bathing: Min guard   Lower Body Bathing: Minimal assistance;Sit to/from stand   Upper Body  Dressing : Min guard   Lower Body Dressing: Minimal assistance;Sit to/from stand   Toilet Transfer: Minimal assistance;Ambulation Toilet Transfer Details (indicate cue type and reason):  for safety Toileting- Clothing Manipulation and Hygiene: Min guard;Sitting/lateral lean;Sit to/from stand       Functional mobility during ADLs: Min guard;Cueing for safety;Cueing for sequencing General ADL Comments: Patient presenting with decreased cognition (though may be baseline) and need for minimal increased asssit to complete ADLs     Vision Baseline Vision/History: 2 Legally blind (L eye) Ability to See in Adequate Light: 0 Adequate Patient Visual Report:  (L eye blind at baseline; able to track all quadrants; will further assess; could see TV without issue, will continue to assess, joked through assessment) Vision Assessment?: Yes Eye Alignment: Within Functional Limits Ocular Range of Motion: Within Functional Limits Alignment/Gaze Preference: Within Defined Limits Tracking/Visual Pursuits: Impaired - to be further tested in functional context Saccades: Decreased speed of saccadic movement;Undershoots;Overshoots Visual Fields: No apparent deficits Additional Comments: Originally complaining of visual field deficit but patient reports it has resolved, (earlier patient could not see the whole TV and now states he can) no running into objects or other noted visual discrepancies     Perception     Praxis      Pertinent Vitals/Pain Pain Assessment Pain Assessment: Faces Faces Pain Scale: No hurt Pain Intervention(s): Monitored during session     Hand Dominance Right   Extremity/Trunk Assessment Upper Extremity Assessment Upper Extremity Assessment: Overall WFL for tasks assessed   Lower Extremity Assessment Lower Extremity Assessment: Defer to PT evaluation   Cervical / Trunk Assessment Cervical / Trunk Assessment: Normal   Communication Communication Communication: Expressive difficulties;Other (comment);HOH (dysarthric due to partial tongue resection)   Cognition Arousal/Alertness: Awake/alert Behavior During Therapy: WFL for tasks assessed/performed Overall  Cognitive Status: No family/caregiver present to determine baseline cognitive functioning Area of Impairment: Memory, Problem solving, Attention, Safety/judgement                   Current Attention Level: Selective Memory: Decreased short-term memory   Safety/Judgement: Decreased awareness of deficits, Decreased awareness of safety   Problem Solving: Slow processing General Comments: Pt not appearing to be very aware of his cognitive deficits. Pt with poor problem-solving, unable to figure out how to use signs to find his room number or acknowledgement that he was going down in numbers when he needed to go up. Needed max cues and pt dependent on therapist for path finding his room     General Comments       Exercises     Shoulder Instructions      Home Living Family/patient expects to be discharged to:: Private residence Living Arrangements: Other relatives (cousin) Available Help at Discharge: Family;Available 24 hours/day Type of Home: House Home Access: Stairs to enter [employer] of Steps: 2-3 Entrance Stairs-Rails:  (no rail but a post) Home Layout: Laundry or work area in basement (does not need to go in basement though)     Bathroom Shower/Tub: Occupational psychologist: Standard Bathroom Accessibility: Yes   Home Equipment: Conservation officer, nature (2 wheels);Cane - single point          Prior Functioning/Environment Prior Level of Function : Independent/Modified Independent             Mobility Comments: no AD ADLs Comments: Was not driving recently, cousin assists pt with meds        OT Problem List: Decreased strength;Decreased activity tolerance;Impaired balance (sitting  and/or standing);Impaired vision/perception;Decreased coordination;Decreased cognition;Decreased safety awareness;Decreased knowledge of use of DME or AE;Impaired sensation;Impaired UE functional use      OT Treatment/Interventions: Self-care/ADL  training;Therapeutic exercise;Neuromuscular education;DME and/or AE instruction;Therapeutic activities;Cognitive remediation/compensation;Visual/perceptual remediation/compensation;Patient/family education;Balance training    OT Goals(Current goals can be found in the care plan section) Acute Rehab OT Goals Patient Stated Goal: to go home OT Goal Formulation: With patient Time For Goal Achievement: 04/16/22 Potential to Achieve Goals: Good  OT Frequency: Min 2X/week    Co-evaluation PT/OT/SLP Co-Evaluation/Treatment: Yes Reason for Co-Treatment: For patient/therapist safety;To address functional/ADL transfers PT goals addressed during session: Mobility/safety with mobility;Balance OT goals addressed during session: ADL's and self-care      AM-PAC OT "6 Clicks" Daily Activity     Outcome Measure Help from another person eating meals?: A Little Help from another person taking care of personal grooming?: A Little Help from another person toileting, which includes using toliet, bedpan, or urinal?: A Little Help from another person bathing (including washing, rinsing, drying)?: A Little Help from another person to put on and taking off regular upper body clothing?: A Little Help from another person to put on and taking off regular lower body clothing?: A Little 6 Click Score: 18   End of Session Equipment Utilized During Treatment: Gait belt Nurse Communication: Mobility status  Activity Tolerance: Patient tolerated treatment well Patient left: in chair;with call bell/phone within reach;with chair alarm set  OT Visit Diagnosis: Unsteadiness on feet (R26.81);Other abnormalities of gait and mobility (R26.89);Muscle weakness (generalized) (M62.81);Low vision, both eyes (H54.2);Other symptoms and signs involving cognitive function                Time: 1101-1119 OT Time Calculation (min): 18 min Charges:  OT General Charges $OT Visit: 1 Visit OT Evaluation $OT Eval Moderate Complexity: 1  Mod  Corinne Ports E. Jair Lindblad, OTR/L Acute Rehabilitation Services [phone]   Ascencion Dike 04/02/2022, 1:24 PM

## 2022-04-02 NOTE — Progress Notes (Addendum)
[facility] PROGRESS NOTE   James Richards [id number] DOB: 10-Jul-1945 DOA: 04/01/2022  PCP: Pcp, No  Brief History/Interval Summary: 77 y.o. male with medical history significant of recurrent TIAs, CRAO in Feb 2023. R MCA stroke last month s/p embolectomy. 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 Pt with residual R sided deficits following the R MCA stroke.  Presented with speech difficulty and confusion.  Symptoms had improved while he was in the emergency department.  Subsequently hospitalized for further management.  Consultants: Neurology  Procedures: None yet    Subjective/Interval History: Patient mentions that his vision seems to be improving.  Denies any weakness in his arms or legs.    Assessment/Plan:  Acute stroke MRI raises concern for embolic stroke.  Patient underwent CT angiogram head and neck. Neurology is consulted. LDL is 43.  HbA1c is 5.7 when checked recently.  Patient is currently on aspirin and Plavix.  Patient is on statin as well. Patient had an echocardiogram recently in January when he was hospitalized for stroke.  Normal EF was noted.  Grade 2 diastolic dysfunction was noted.  No need to repeat echocardiogram at this time. PT OT speech therapy evaluation. Permissive hypertension. Patient does have some residual left-sided deficits from his previous stroke in January. CT of the chest abdomen pelvis has been ordered to look for occult malignancy.  Obesity Estimated body mass index is 30.74 kg/m as calculated from the following:   Height as of this encounter: 5' 5"$  (1.651 m).   Weight as of this encounter: 83.8 kg.   DVT Prophylaxis: Initiate Lovenox Code Status: Full code Family Communication: Discussed with patient.  No family at bedside Disposition Plan: To be determined  Status is: Inpatient Remains inpatient appropriate because: Acute stroke      Medications: Scheduled:  [START ON 04/03/2022]   stroke: early stages of recovery book   Does not apply Once   aspirin EC  81 mg Oral Daily   atorvastatin  80 mg Oral Daily   clopidogrel  75 mg Oral Daily   sodium chloride flush  3 mL Intravenous Once   Continuous: [id number] **OR** acetaminophen (TYLENOL) oral liquid 160 mg/5 mL **OR** acetaminophen  Antibiotics: Anti-infectives (From admission, onward)    None       Objective:  Vital Signs  Vitals:   04/02/22 0007 04/02/22 0142 04/02/22 0333 04/02/22 0751  BP:  (!) 142/92 (!) 141/77 (!) 155/88  Pulse:  74 80 69  Resp:  16 16 18  $ Temp:  98 F (36.7 C) 98.9 F (37.2 C) 98.6 F (37 C)  TempSrc:  Oral Oral Oral  SpO2: 99% 100% 95% 99%  Weight:  83.8 kg    Height:  5' 5"$  (1.651 m)      Intake/Output Summary (Last 24 hours) at 04/02/2022 1016 Last data filed at 04/02/2022 0100 Gross per 24 hour  Intake 0 ml  Output --  Net 0 ml   Filed Weights   04/01/22 1723 04/02/22 0142  Weight: 80.7 kg 83.8 kg    General appearance: Awake alert.  In no distress Resp: Clear to auscultation bilaterally.  Normal effort Cardio: S1-S2 is normal regular.  No S3-S4.  No rubs murmurs or bruit GI: Abdomen is soft.  Nontender nondistended.  Bowel sounds are present normal.  No masses organomegaly Extremities: No edema.  Full range of motion of lower extremities. Neurologic: Alert and oriented x3.  No focal neurological deficits.    Lab Results:  Data Reviewed: I have personally reviewed following labs and reports of the imaging studies  CBC: Recent Labs  Lab 04/01/22 1755 04/01/22 1758  WBC 7.4  --   NEUTROABS 5.6  --   HGB 13.1 13.6  HCT 39.5 40.0  MCV 95.9  --   PLT 252  --     Basic Metabolic Panel: Recent Labs  Lab 04/01/22 1755 04/01/22 1758  NA 142 142  K 4.3 4.3  CL 106 105  CO2 25  --   GLUCOSE 101* 97  BUN 21 25*  CREATININE 1.19 1.10  CALCIUM 9.4  --     GFR: Estimated Creatinine Clearance: 57.8 mL/min (by C-G formula based on SCr of 1.1  mg/dL).  Liver Function Tests: Recent Labs  Lab 04/01/22 1755  AST 31  ALT 31  ALKPHOS 78  BILITOT 0.4  PROT 6.4*  ALBUMIN 3.5     Coagulation Profile: Recent Labs  Lab 04/01/22 1755  INR 1.0     Lipid Profile: Recent Labs    04-08-22 0432  CHOL 98  HDL 46  LDLCALC 43  TRIG 45  CHOLHDL 2.1     Radiology Studies: EEG adult  Result Date: 04-08-22 Lora Havens, MD     04-08-2022  8:43 AM Patient Name: James Richards MRN: [id number] Epilepsy Attending: Lora Havens Referring Physician/Provider: Donnetta Simpers, MD Date: 04/08/22 Duration: 27.40 mins Patient history: 77 y.o. male with PMH significant for receurrent strokes/TIA, CRAO, peripheral vascular disease, HTN, HLD, recent admission for R MCA stroke 2/2 calcified R MCA M2 embolus thought to be secondary to riht ICA therosclerosis and was discharged on Aspirin and plavix and enrolled in [place] trial who presents with intermittent episodes of confusion/word finding difficulty x 3. EEG to evaluate for seizure Level of alertness: Awake AEDs during EEG study: None Technical aspects: This EEG study was done with scalp electrodes positioned according to the 10-20 International system of electrode placement. Electrical activity was reviewed with band pass filter of 1-70Hz$ , sensitivity of 7 uV/mm, display speed of 40m/sec with a 60Hz$  notched filter applied as appropriate. EEG data were recorded continuously and digitally stored.  Video monitoring was available and reviewed as appropriate. Description: The posterior dominant rhythm consists of 8 Hz activity of moderate voltage (25-35 uV) seen predominantly in posterior head regions, symmetric and reactive to eye opening and eye closing. EEG showed intermittent generalized 5 to 6 Hz theta slowing. Physiologic photic driving was not seen during photic stimulation.  Hyperventilation was not performed.   ABNORMALITY - Intermittent slow, generalized IMPRESSION: This study is  suggestive of mild diffuse encephalopathy, nonspecific etiology. No seizures or epileptiform discharges were seen throughout the recording. Please note lack of epileptiform activity during interictal EEG does not exclude the diagnosis of epilepsy. PLora Havens  CT ANGIO HEAD NECK W WO CM  Result Date: 22024-02-23CLINICAL DATA:  Sudden onset generalized weakness EXAM: CT ANGIOGRAPHY HEAD AND NECK TECHNIQUE: Multidetector CT imaging of the head and neck was performed using the standard protocol during bolus administration of intravenous contrast. Multiplanar CT image reconstructions and MIPs were obtained to evaluate the vascular anatomy. Carotid stenosis measurements (when applicable) are obtained utilizing NASCET criteria, using the distal internal carotid diameter as the denominator. RADIATION DOSE REDUCTION: This exam was performed according to the departmental dose-optimization program which includes automated exposure control, adjustment of the mA and/or kV according to patient size and/or use of iterative reconstruction technique. CONTRAST:  775mOMNIPAQUE IOHEXOL 350  MG/ML SOLN COMPARISON:  03/06/2022 CTA head and neck, correlation is also made with CT head 04/01/2022 FINDINGS: CT HEAD FINDINGS For noncontrast findings, please see 04/01/2022 CT head. CTA NECK FINDINGS Aortic arch: 3 arch, with a common origin of the brachiocephalic and left common carotid arteries and aortic origin of the left vertebral artery. Imaged portion shows no evidence of aneurysm or dissection. No significant stenosis of the major arch vessel origins. Right carotid system: No evidence of dissection, occlusion, or hemodynamically significant stenosis (greater than 50%). Redemonstrated ulcerated plaque at the bifurcation and in the proximal right ICA. Left carotid system: Redemonstrated occlusion of the left ICA at its origin. The left CCA and ECA are patent. Vertebral arteries: Chronically occluded right vertebral artery. The  left vertebral artery is patent, without significant stenosis, dissection, or occlusion. Skeleton: No acute osseous abnormality. Other neck: No acute finding. Upper chest: No focal pulmonary opacity or pleural effusion. Review of the MIP images confirms the above findings CTA HEAD FINDINGS Anterior circulation: Redemonstrated chronically occluded left ICA, with reconstitution at the terminus. The right ICA is patent to the terminus. A1 segments patent. Normal anterior communicating artery. Anterior cerebral arteries are patent to their distal aspects. No M1 stenosis or occlusion. A previously noted occluded right MCA branch in the area of a likely calcific embolus (series 7, image 87), is now patent (series 7, image 84). To their MCA branches perfused distal aspects. Posterior circulation: Chronically occluded right vertebral artery. The left vertebral artery is patent to the vertebrobasilar junction. The left PICA is patent proximally. Basilar patent to its distal aspect. Superior cerebellar arteries patent proximally. Fetal origin of the left PCA and near fetal origin of the right PCA. The PCAs are patent to their distal aspects without significant stenosis. Venous sinuses: Not well opacified. Anatomic variants: Fetal origin of the left PCA and near fetal origin of the right PCA. Review of the MIP images confirms the above findings IMPRESSION: 1. Redemonstrated chronically occluded left ICA, with reconstitution at the terminus, and chronically occluded right vertebral artery. 2. A previously noted occluded right MCA branch in the area of a likely calcific embolus is now patent. 3. No other intracranial large vessel occlusion or significant stenosis. 4. No additional hemodynamically significant stenosis in the neck. Redemonstrated ulcerated plaque at the right carotid bifurcation and in the proximal right ICA. Electronically Signed   By: Merilyn Baba M.D.   On: 04/02/2022 00:26   MR BRAIN WO CONTRAST  Result  Date: 04/01/2022 CLINICAL DATA:  Transient episodes of confusion and aphasia, recent stroke EXAM: MRI HEAD WITHOUT CONTRAST TECHNIQUE: Multiplanar, multiecho pulse sequences of the brain and surrounding structures were obtained without intravenous contrast. COMPARISON:  03/07/2022 MRI head, correlation is also made with 04/01/2022 CT head FINDINGS: Evaluation is limited by motion brain: Scattered, primarily cortical foci of restricted diffusion with ADC correlates in the bilateral cerebral hemispheres (series 2, images 21-23, 26-29, 34-35, 37-41, 43-44, and 46), as well as in the right caudate head and left external capsule (series 2, image 31), which are new from the prior MRI and likely reflect represent acute to subacute infarcts. Some restricted diffusion with diminished ADC correlate remains in the area of prior infarct in the posterior right frontal and anterior right parietal cortex, consistent with subacute, evolving infarct. No acute hemorrhage, mass, mass effect, or midline shift. No hydrocephalus or definite extra-axial collection. Redemonstrated remote infarcts in the left corona radiata, bilateral parietal cortex, and posterior right temporal cortex. Vascular: Limited evaluation  secondary to motion. Skull and upper cervical spine: Limited evaluation secondary to motion. Sinuses/Orbits: No acute finding. Other: Fluid in bilateral mastoid air cells. IMPRESSION: 1. Evaluation is limited by motion. Within this limitation, there are scattered foci of restricted diffusion in the bilateral cerebral hemispheres, as well as in the right caudate head and left external capsule, which are new from the prior MRI and likely reflect acute to early subacute infarcts. Given multiple vascular territories, a central embolic etiology is likely. 2. Evolving subacute, evolving infarct in the posterior right frontal and anterior right parietal cortex. These results were called by telephone at the time of interpretation on  04/01/2022 at 11:00 pm to provider MATTHEW TRIFAN , who verbally acknowledged these results. Electronically Signed   By: Merilyn Baba M.D.   On: 04/01/2022 23:00   CT HEAD WO CONTRAST  Result Date: 04/01/2022 CLINICAL DATA:  Neuro deficit, acute, stroke suspected EXAM: CT HEAD WITHOUT CONTRAST TECHNIQUE: Contiguous axial images were obtained from the base of the skull through the vertex without intravenous contrast. RADIATION DOSE REDUCTION: This exam was performed according to the departmental dose-optimization program which includes automated exposure control, adjustment of the mA and/or kV according to patient size and/or use of iterative reconstruction technique. COMPARISON:  CT head 03/06/2022, MRI head 03/29/2021 FINDINGS: Brain: Cerebral ventricle sizes are concordant with the degree of cerebral volume loss. Patchy and confluent areas of decreased attenuation are noted throughout the deep and periventricular white matter of the cerebral hemispheres bilaterally, compatible with chronic microvascular ischemic disease. Left frontoparietal and anterior temporal lobe encephalomalacia. Right temporal lobe encephalomalacia. No evidence of large-territorial acute infarction. No parenchymal hemorrhage. No mass lesion. No extra-axial collection. No mass effect or midline shift. No hydrocephalus. Basilar cisterns are patent. Vascular: No hyperdense vessel. Atherosclerotic calcifications are present within the cavernous internal carotid arteries. Skull: No acute fracture or focal lesion. Sinuses/Orbits: Paranasal sinuses and mastoid air cells are clear. The orbits are unremarkable. Other: None. IMPRESSION: No acute intracranial abnormality in a patient with chronic bilateral cerebral infarction. Electronically Signed   By: Iven Finn M.D.   On: 04/01/2022 19:11       LOS: 0 days   [facility] Pager on [email or web address]  04/02/2022, 10:16 AM

## 2022-04-02 NOTE — Assessment & Plan Note (Addendum)
Pt with some residual L sided deficits following stroke last month, but these are reportedly back to baseline (since discharge last month) following todays acute event(s). PT OT SLP

## 2022-04-02 NOTE — H&P (Signed)
History and Physical    Patient: James Richards [id number] DOB: 1946/01/06 DOA: 04/01/2022 DOS: the patient was seen and examined on 04/02/2022 PCP: Pcp, No  Patient coming from: Home  Chief Complaint:  Chief Complaint  Patient presents with   Transient Ischemic Attack   HPI: James Richards is a 77 y.o. male with medical history significant of recurrent TIAs, CRAO in Feb 2023.  R MCA stroke last month s/p embolectomy.  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  Pt with residual R sided deficits following the R MCA stroke.  Pt in to ED today with c/o speech difficulty, confusion.  3 episodes this week which felt like TIAs.  He feels back to baseline. Per EMS report the patient was "back to his baseline status".  Also has R eye vision deficits now that are new, though improved compared to yesterday patient tells me.  (Doesn't sound like he's back to baseline status to me).   Review of Systems: As mentioned in the history of present illness. All other systems reviewed and are negative. 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   History of recurrent TIAs 12/11/2014   Impacted cerumen 11/26/2019   Malignant neoplasm of base of tongue ([place]) 01/21/2015   Malignant neoplasm of larynx, unspecified (HCC)    Migraine 04/02/2014   Mixed hyperlipidemia    Other fatigue    Other specified hypothyroidism    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    Restless legs syndrome    Restrictive lung disease 11/26/2019   TIA (transient ischemic attack) 11/24/2014   Vision exam with  abnormal findings concern for left retinal artery occlusion 03/29/2021   Past Surgical History:  Procedure Laterality Date   IR CT HEAD LTD  03/06/2022   IR PERCUTANEOUS ART THROMBECTOMY/INFUSION INTRACRANIAL INC DIAG ANGIO  03/06/2022   LYMPHADENECTOMY     PERIPHERAL VASCULAR CATHETERIZATION N/A 11/24/2014   Procedure: Aortic Arch Angiography;  Surgeon: Angelia Mould, MD;  Location: [facility];  Service: Cardiovascular;  Laterality: N/A;   PERIPHERAL VASCULAR CATHETERIZATION N/A 11/24/2014   Procedure: Cerebral Angiography;  Surgeon: Angelia Mould, MD;  Location: [facility];  Service: Cardiovascular;  Laterality: N/A;   RADIOLOGY WITH ANESTHESIA N/A 03/06/2022   Procedure: RADIOLOGY WITH ANESTHESIA;  Surgeon: Radiologist, Medication, MD;  Location: [facility];  Service: Radiology;  Laterality: N/A;   THROAT SURGERY     TONSILECTOMY/ADENOIDECTOMY WITH MYRINGOTOMY     Social History:  reports that he quit smoking about 15 years ago. His smoking use included cigarettes. He has a 41.00 pack-year smoking history. He has quit using smokeless tobacco.  His smokeless tobacco use included chew. He reports that he does not drink alcohol and does not use drugs.  No Known Allergies  Family History  Problem Relation Age of Onset   Other Father        amputation   Deep vein thrombosis Father    Heart disease Father        Atrial Fib.   Cancer Sister    Hypertension Brother     Prior to Admission medications   Medication Sig Start Date End Date  Taking? Authorizing Provider  acetaminophen-codeine (TYLENOL #3) 300-30 MG tablet TAKE 1 TABLET EVERY 4 HOURS AS NEEDED FOR MODERATE PAIN Patient taking differently: Take 1 tablet by mouth every 4 (four) hours as needed for moderate pain or severe pain. 07/07/21   Rip Harbour, NP  albuterol (VENTOLIN HFA) 108 (90 Base) MCG/ACT inhaler Inhale 2 puffs into the lungs every 6 (six) hours as needed for wheezing or shortness of  breath. 07/20/21   [provider]  ascorbic acid (VITAMIN C) 500 MG tablet Take 500 mg by mouth daily.    [provider]  aspirin EC 81 MG tablet Take 81 mg by mouth daily.    [provider]  atorvastatin (LIPITOR) 80 MG tablet TAKE 1 TABLET EVERY DAY Patient taking differently: Take 80 mg by mouth daily. 02/16/22   Cox, Elnita Maxwell, MD  CINNAMON PO Take 1 capsule by mouth daily.    [provider]  clopidogrel (PLAVIX) 75 MG tablet Take 1 tablet (75 mg total) by mouth daily. 09/28/21   Lillard Anes, MD  furosemide (LASIX) 40 MG tablet Take 1 tablet (40 mg total) by mouth every other day. Patient taking differently: Take 40 mg by mouth daily as needed for fluid or edema. 04/22/21   Lillard Anes, MD  GARLIC PO Take [id number] mg by mouth daily.    [provider]  Ginkgo Biloba (GINKOBA PO) Take 120 mg by mouth daily.    [provider]  levothyroxine (SYNTHROID) 125 MCG tablet Take 1 tablet (125 mcg total) by mouth daily. 03/03/22   Rip Harbour, NP  losartan (COZAAR) 50 MG tablet Take 1 tablet (50 mg total) by mouth daily. 12/27/21   Rip Harbour, NP  minocycline (MINOCIN) 50 MG capsule Take 1 capsule (50 mg total) by mouth daily. Patient taking differently: Take 50 mg by mouth every 12 (twelve) hours. 09/28/21   Lillard Anes, MD  Omega-3 Fatty Acids (FISH OIL) 1200 MG CPDR Take 1 capsule by mouth daily.    [provider]  pantoprazole (PROTONIX) 40 MG tablet Take 1 tablet (40 mg total) by mouth daily. 03/08/22   Rosalin Hawking, MD  Potassium Bicarbonate 99 MG CAPS Take 99 mg by mouth daily.    [provider]  rOPINIRole (REQUIP) 2 MG tablet Take 2 mg by mouth at bedtime as needed (restless legs). 02/22/22   [provider]  Study Samuel Jester - milvexian 25 mg or placebo tablet (PI-Sethi) Take 1 tablet by mouth 2 (two) times daily. For Investigational Use Only. Take with or without food at  approximately the same time each day. 03/08/22   Rosalin Hawking, MD  SYMBICORT 160-4.5 MCG/ACT inhaler Inhale 2 puffs into the lungs daily. 12/21/21   [provider]  vitamin B-12 (CYANOCOBALAMIN) 500 MCG tablet Take 500 mcg by mouth daily.    [provider]  zinc gluconate 50 MG tablet Take 50 mg by mouth daily.    [provider]    Physical Exam: Vitals:   04/01/22 1730 04/01/22 1841 04/01/22 2012 04/01/22 2300  BP: (!) 173/105  (!) 152/94 (!) 116/96  Pulse: 72 74 89 85  Resp: (!) 25 (!) 24 18 (!) 22  Temp:      TempSrc:      SpO2: 100% 97% 97% 99%  Weight:      Height:       Constitutional: NAD, calm, comfortable Respiratory: clear to auscultation bilaterally, no wheezing, no crackles. Normal  respiratory effort. No accessory muscle use.  Cardiovascular: Regular rate and rhythm, no murmurs / rubs / gallops. No extremity edema. 2+ pedal pulses. No carotid bruits.  Abdomen: no tenderness, no masses palpated. No hepatosplenomegaly. Bowel sounds positive.  Neurologic: Speech slurred, LUE grip strength weak compared to R. Psychiatric: Normal judgment and insight. Alert and oriented x 3. Normal mood.   Data Reviewed:     MRI brain: IMPRESSION: 1. Evaluation is limited by motion. Within this limitation, there are scattered foci of restricted diffusion in the bilateral cerebral hemispheres, as well as in the right caudate head and left external capsule, which are new from the prior MRI and likely reflect acute to early subacute infarcts. Given multiple vascular territories, a central embolic etiology is likely. 2. Evolving subacute, evolving infarct in the posterior right frontal and anterior right parietal cortex.   CTA head and neck: IMPRESSION: 1. Redemonstrated chronically occluded left ICA, with reconstitution at the terminus, and chronically occluded right vertebral artery. 2. A previously noted occluded right MCA branch in the area of a likely  calcific embolus is now patent. 3. No other intracranial large vessel occlusion or significant stenosis. 4. No additional hemodynamically significant stenosis in the neck. Redemonstrated ulcerated plaque at the right carotid bifurcation and in the proximal right ICA.   Assessment and Plan: * Acute embolic stroke ([place]) Pt with acute stroke findings on MRI.  Looks cardio embolic this time around. Not so sure he's back to baseline neuro status however, as he does report ongoing R eye vision deficit. Stroke pathway Neuro consult to try and clarify which deficits new and which from stroke last month. CT C/A/P to screen for occult malignancy. Cont asa, plavix, and study medication for the moment pending stroke team review Now strokes look more cardioembolic however ? Need for ILR ? Need for TEE (ill hold off on ordering 2d echo) PT/OT/SLP Tele monitor Cont statin Allow permissive HTN  Left hemiparesis (HCC) Pt with some residual L sided deficits following stroke last month, but these are reportedly back to baseline (since discharge last month) following todays acute event(s). PT OT SLP      Advance Care Planning:   Code Status: Full Code  Consults: Dr. Lorrin Goodell  Family Communication: No family in room  Severity of Illness: The appropriate patient status for this patient is INPATIENT. Inpatient status is judged to be reasonable and necessary in order to provide the required intensity of service to ensure the patient's safety. The patient's presenting symptoms, physical exam findings, and initial radiographic and laboratory data in the context of their chronic comorbidities is felt to place them at high risk for further clinical deterioration. Furthermore, it is not anticipated that the patient will be medically stable for discharge from the hospital within 2 midnights of admission.   * I certify that at the point of admission it is my clinical judgment that the patient will  require inpatient hospital care spanning beyond 2 midnights from the point of admission due to high intensity of service, high risk for further deterioration and high frequency of surveillance required.*  Author: Etta Quill., DO 04/02/2022 12:51 AM  For on call review [email or web address].

## 2022-04-02 NOTE — Assessment & Plan Note (Addendum)
Pt with acute stroke findings on MRI.  Looks cardio embolic this time around. Not so sure he's back to baseline neuro status however, as he does report ongoing R eye vision deficit. Stroke pathway Neuro consult to try and clarify which deficits new and which from stroke last month. CT C/A/P to screen for occult malignancy. Cont asa, plavix, and study medication for the moment pending stroke team review Now strokes look more cardioembolic however ? Need for ILR ? Need for TEE (ill hold off on ordering 2d echo) PT/OT/SLP Tele monitor Cont statin Allow permissive HTN

## 2022-04-02 NOTE — Evaluation (Signed)
Speech Language Pathology Evaluation Patient Details Name: James Richards MRN: [id number] DOB: 22-Feb-1945 Today's Date: 04/02/2022 Time: [id number] SLP Time Calculation (min) (ACUTE ONLY): 15 min  Problem List:  Patient Active Problem List   Diagnosis Date Noted   Acute embolic stroke ([place]) 123XX123   Middle cerebral artery stenosis, right 03/19/2022   Left hemiparesis ([place]) 03/06/2022   Hypertensive heart disease with congestive heart failure and with combined systolic and diastolic dysfunction ([place]) 03/06/2022   Middle cerebral artery embolism, left 03/06/2022   Hearing loss of both ears due to cerumen impaction 08/24/2021   Otitis media, chronic with perforation 07/27/2021   Otitis media, purulent, acute, with spontaneous rupture of TM 07/27/2021   Central retinal artery occlusion of left eye 03/31/2021   Occlusion of extracranial carotid artery, left 03/31/2021   Hypertensive heart disease with chronic diastolic congestive heart failure ([place]) 06/23/2020   Mixed hearing loss 06/23/2020   Restrictive lung disease 11/26/2019   Other specified hypothyroidism    Hyperlipidemia    Restless legs syndrome    History of TIA (transient ischemic attack) 12/11/2014   Carotid stenosis 11/24/2014   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   Migraine 04/02/2014   Atherosclerosis of native artery of extremity with intermittent claudication ([place]) 10/09/2013   Past Medical History:  Past Medical History:  Diagnosis Date   Atherosclerosis of native arteries of the extremities with intermittent claudication 10/09/2013   Atherosclerotic peripheral vascular disease with intermittent claudication ([place]) 10/03/2012   BMI 33.0-33.9,adult 07/07/2019   Carotid stenosis 123XX123   Diastolic dysfunction [id number]   Essential hypertension    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   History of recurrent TIAs 12/11/2014   Impacted cerumen 11/26/2019   Malignant neoplasm of base of tongue  ([place]) 01/21/2015   Malignant neoplasm of larynx, unspecified (HCC)    Migraine 04/02/2014   Mixed hyperlipidemia    Other fatigue    Other specified hypothyroidism    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    Restless legs syndrome    Restrictive lung disease 11/26/2019   TIA (transient ischemic attack) 11/24/2014   Vision exam with abnormal findings concern for left retinal artery occlusion 03/29/2021   Past Surgical History:  Past Surgical History:  Procedure Laterality Date   IR CT HEAD LTD  03/06/2022   IR PERCUTANEOUS ART THROMBECTOMY/INFUSION INTRACRANIAL INC DIAG ANGIO  03/06/2022   LYMPHADENECTOMY     PERIPHERAL VASCULAR CATHETERIZATION N/A 11/24/2014   Procedure: Aortic Arch Angiography;  Surgeon: Angelia Mould, MD;  Location: [facility];  Service: Cardiovascular;  Laterality: N/A;   PERIPHERAL VASCULAR CATHETERIZATION N/A 11/24/2014   Procedure: Cerebral Angiography;  Surgeon: Angelia Mould, MD;  Location: [facility];  Service: Cardiovascular;  Laterality: N/A;   RADIOLOGY WITH ANESTHESIA N/A 03/06/2022   Procedure: RADIOLOGY WITH ANESTHESIA;  Surgeon: Radiologist, Medication, MD;  Location: [facility];  Service: Radiology;  Laterality: N/A;   THROAT SURGERY     TONSILECTOMY/ADENOIDECTOMY WITH MYRINGOTOMY     HPI:  Patient is a 77 yo male presenting to the ED with sudden onset of generalized weakness, confusion and slurred speech that lasted about 30 min per wife on 04/01/22. She states this is the 3rd TIA this week. He had a recent ischemic stroke 2 weeks ago with embolectomy. MRI on 04/01/22 showing: scattered foci of restricted diffusion in the bilateral cerebral hemispheres, as well as in the right caudate head and left external  Capsule and Evolving subacute, evolving  infarct in the posterior right frontal and anterior right parietal cortex. 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  Pt has a hx of cognitive-linguistic deficits and dysphagia with most recent evaluations completed on 03/07/22.  MRI remarkable for "Evolving subacute, evolving infarct in the posterior right  frontal and anterior right parietal cortex."   Assessment / Plan / Recommendation Clinical Impression  Pt was seen for a cognitive-linguistic evaluation and he presents with moderate cognitive deficits with functional expressive and receptive language abilities.  Pt has baseline cognitive deficits that appear to be unchanged from previous evaluation and wife reported that pt is at his cognitive-linguistic baseline.  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  Pt was unable to complete the full [place] Mental Status Examination secondary to visual and hearing deficits, but he exhibited cognitive deficits in the areas of short-term memory, attention, and functional calculations/problem solving.  Given that pt appears to be at his cognitive-linguistic baseline and wife is already responsible for IADLs at home, no further skilled ST is warranted at this time targeting cognition/communication.  SLP will continue to f/u for dysphagia.    SLP Assessment  SLP Recommendation/Assessment: Patient does not need any further Speech [place] Pathology Services (for cognitive-linguistic deficits) SLP Visit Diagnosis: Cognitive communication deficit (R41.841)    Recommendations for follow up therapy are one component of a multi-disciplinary discharge planning process, led by the attending physician.  Recommendations may be updated based on patient status, additional functional criteria and insurance authorization.    Follow Up Recommendations  No SLP follow up    Assistance Recommended at  Discharge  Frequent or constant Supervision/Assistance  Functional Status Assessment Patient has had a recent decline in their functional status and demonstrates the ability to make significant improvements in function in a reasonable and predictable amount of time.  Frequency and Duration min 2x/week  2 weeks      SLP Evaluation Cognition  Overall Cognitive Status: History of cognitive impairments - at baseline Arousal/Alertness: Awake/alert Orientation Level: Oriented X4 Attention: Sustained;Focused Focused Attention: Impaired Focused Attention Impairment: Verbal basic Sustained Attention: Impaired Sustained Attention Impairment: Verbal basic Memory: Impaired Memory Impairment: Decreased short term memory Decreased Short Term Memory: Verbal basic Awareness: Impaired Awareness Impairment: Emergent impairment Problem Solving: Impaired Problem Solving Impairment: Verbal complex Safety/Judgment: Impaired       Comprehension  Auditory Comprehension Overall Auditory Comprehension: Appears within functional limits for tasks assessed    Expression Expression Primary Mode of Expression: Verbal Verbal Expression Overall Verbal Expression: Appears within functional limits for tasks assessed Written Expression Dominant Hand: Right   Oral / Motor  Oral Motor/Sensory Function Overall Oral Motor/Sensory Function: Mild impairment Facial ROM: Within Functional Limits Facial Symmetry: Within Functional Limits Lingual ROM: Reduced right;Reduced left Motor Speech Overall Motor Speech: Impaired at baseline Respiration: Within functional limits Level of Impairment: Word Phonation: Normal Resonance: Within functional limits Articulation: Impaired Level of Impairment: Word Intelligibility: Intelligibility reduced Word: 75-100% accurate Phrase: 50-74% accurate Sentence: 50-74% accurate Conversation: 50-74% accurate Motor Speech Errors: Aware Interfering Components: Hearing  loss Effective Techniques: Over-articulate;Slow rate           Bretta Bang, M.S., [place] Office: [phone]  [place] 04/02/2022, 2:11 PM

## 2022-04-02 NOTE — Consult Note (Signed)
NEUROLOGY CONSULTATION NOTE   Date of service: April 02, 2022 Patient Name: James Richards MRN:  [id number] DOB:  [birth year] Reason for consult: "embolic strokes on MRI Brain" Requesting Provider: Etta Quill, DO _ _ _   _ __   _ __ _ _  __ __   _ __   __ _  History of Present Illness  James Richards is a 77 y.o. male with PMH significant for receurrent strokes/TIA, CRAO, peripheral vascular disease, HTN, HLD, who presents with intermittent episodes of confusion/word finding difficulty. Has had atleast 3 episodes per my discussion with ED team and the episode today lasted about 30 mins. He was recently in the hospital for a R MCA stroke 2/2 calcified R MCA M2 embolus thought to be secondary to riht ICA therosclerosis and was discharged on Aspirin and plavix and enrolled in [place] trial but was sent to the ED by care taker for these episodes.  Care taker is unreachable at this time to provide any additional history. MRI Brain was obtained which demonstrates scatter small strokes bilaterally.  Neurology consulted for further evaluation and workup.  On my discussion with patient, he reports that he was sent to the hospital as he was having a stroke. He is unable to tell me what symptoms he was having or when they started.  LKW: unclear. mRS: unknown tNKASE: not offered, no clear symptoms Thrombectomy: not offered, low suspicion for LVO NIHSS components Score: Comment  1a Level of Conscious 0[x]$  1[]$  2[]$  3[]$      1b LOC Questions 0[x]$  1[]$  2[]$       1c LOC Commands 0[x]$  1[]$  2[]$       2 Best Gaze 0[x]$  1[]$  2[]$       3 Visual 0[x]$  1[]$  2[]$  3[]$      4 Facial Palsy 0[x]$  1[]$  2[]$  3[]$      5a Motor Arm - left 0[x]$  1[]$  2[]$  3[]$  4[]$  UN[]$    5b Motor Arm - Right 0[x]$  1[]$  2[]$  3[]$  4[]$  UN[]$    6a Motor Leg - Left 0[x]$  1[]$  2[]$  3[]$  4[]$  UN[]$    6b Motor Leg - Right 0[x]$  1[]$  2[]$  3[]$  4[]$  UN[]$    7 Limb Ataxia 0[]$  1[x]$  2[]$  3[]$  UN[]$     8 Sensory 0[x]$  1[]$  2[]$  UN[]$      9 Best Language 0[x]$  1[]$  2[]$  3[]$      10  Dysarthria 0[]$  1[x]$  2[]$  UN[]$      11 Extinct. and Inattention 0[x]$  1[]$  2[]$       TOTAL: 2        ROS   Constitutional Denies weight loss, fever and chills.   HEENT R eye was bothering but left eye is the bad one. He is hard of hearing  Respiratory Denies SOB and cough.   CV Denies palpitations and CP   GI Denies abdominal pain, nausea, vomiting and diarrhea.   GU Denies dysuria and urinary frequency.   MSK Denies myalgia and joint pain.   Skin Denies rash and pruritus.   Neurological Denies headache and syncope.   Psychiatric Denies recent changes in mood. Denies anxiety and depression.    Past History   Past Medical History:  Diagnosis Date   Atherosclerosis of native arteries of the extremities with intermittent claudication 10/09/2013   Atherosclerotic peripheral vascular disease with intermittent claudication ([place]) 10/03/2012   BMI 33.0-33.9,adult 07/07/2019   Carotid stenosis 123XX123   Diastolic dysfunction [id number]   Essential hypertension    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   History of recurrent TIAs 12/11/2014  Impacted cerumen 11/26/2019   Malignant neoplasm of base of tongue ([place]) 01/21/2015   Malignant neoplasm of larynx, unspecified (HCC)    Migraine 04/02/2014   Mixed hyperlipidemia    Other fatigue    Other specified hypothyroidism    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    Restless legs syndrome    Restrictive lung disease 11/26/2019   TIA (transient ischemic attack) 11/24/2014   Vision exam with abnormal findings concern for left retinal artery occlusion 03/29/2021   Past Surgical History:  Procedure Laterality Date   IR CT HEAD LTD  03/06/2022   IR PERCUTANEOUS ART THROMBECTOMY/INFUSION INTRACRANIAL INC DIAG ANGIO  03/06/2022   LYMPHADENECTOMY     PERIPHERAL VASCULAR CATHETERIZATION N/A 11/24/2014   Procedure: Aortic Arch Angiography;  Surgeon: Angelia Mould, MD;  Location: [facility];  Service:  Cardiovascular;  Laterality: N/A;   PERIPHERAL VASCULAR CATHETERIZATION N/A 11/24/2014   Procedure: Cerebral Angiography;  Surgeon: Angelia Mould, MD;  Location: [facility];  Service: Cardiovascular;  Laterality: N/A;   RADIOLOGY WITH ANESTHESIA N/A 03/06/2022   Procedure: RADIOLOGY WITH ANESTHESIA;  Surgeon: Radiologist, Medication, MD;  Location: [facility];  Service: Radiology;  Laterality: N/A;   THROAT SURGERY     TONSILECTOMY/ADENOIDECTOMY WITH MYRINGOTOMY     Family History  Problem Relation Age of Onset   Other Father        amputation   Deep vein thrombosis Father    Heart disease Father        Atrial Fib.   Cancer Sister    Hypertension Brother    Social History   Socioeconomic History   Marital status: Divorced    Spouse name: Not on file   Number of children: Not on file   Years of education: Not on file   Highest education level: Not on file  Occupational History   Not on file  Tobacco Use   Smoking status: Former    Packs/day: 1.00    Years: 41.00    Total pack years: 41.00    Types: Cigarettes    Quit date: 02/15/2007    Years since quitting: 15.1   Smokeless tobacco: Former    Types: Nurse, children's Use: Never used  Substance and Sexual Activity   Alcohol use: No    Alcohol/week: 0.0 standard drinks of alcohol   Drug use: No   Sexual activity: Not Currently    Partners: Female  Other Topics Concern   Not on file  Social History Narrative   Not on file   Social Determinants of Health   Financial Resource Strain: Low Risk  (08/26/2021)   Overall Financial Resource Strain (CARDIA)    Difficulty of Paying Living Expenses: Not hard at all  Food Insecurity: No Food Insecurity (03/09/2022)   Hunger Vital Sign    Worried About Running Out of Food in the Last Year: Never true    [place] in the Last Year: Never true  Transportation Needs: No Transportation Needs (03/09/2022)   PRAPARE - Hydrologist  (Medical): No    Lack of Transportation (Non-Medical): No  Physical Activity: Inactive (08/26/2021)   Exercise Vital Sign    Days of Exercise per Week: 0 days    Minutes of Exercise per Session: 0 min  Stress: No Stress Concern Present (08/26/2021)   [place]    Feeling of  Stress : Not at all  Social Connections: Moderately Isolated (08/26/2021)   Social Connection and Isolation Panel [NHANES]    Frequency of Communication with Friends and Family: More than three times a week    Frequency of Social Gatherings with Friends and Family: More than three times a week    Attends Religious Services: More than 4 times per year    Active Member of [employer] or Organizations: No    Attends Music therapist: Never    Marital Status: Divorced   No Known Allergies  Medications   Medications Prior to Admission  Medication Sig Dispense Refill Last Dose   acetaminophen-codeine (TYLENOL #3) 300-30 MG tablet TAKE 1 TABLET EVERY 4 HOURS AS NEEDED FOR MODERATE PAIN (Patient taking differently: Take 1 tablet by mouth every 4 (four) hours as needed for moderate pain or severe pain.) 42 tablet 0    albuterol (VENTOLIN HFA) 108 (90 Base) MCG/ACT inhaler Inhale 2 puffs into the lungs every 6 (six) hours as needed for wheezing or shortness of breath.      ascorbic acid (VITAMIN C) 500 MG tablet Take 500 mg by mouth daily.      aspirin EC 81 MG tablet Take 81 mg by mouth daily.      atorvastatin (LIPITOR) 80 MG tablet TAKE 1 TABLET EVERY DAY (Patient taking differently: Take 80 mg by mouth daily.) 90 tablet 2    CINNAMON PO Take 1 capsule by mouth daily.      clopidogrel (PLAVIX) 75 MG tablet Take 1 tablet (75 mg total) by mouth daily. 90 tablet 2    furosemide (LASIX) 40 MG tablet Take 1 tablet (40 mg total) by mouth every other day. (Patient taking differently: Take 40 mg by mouth daily as needed for fluid or edema.) 90 tablet 2     GARLIC PO Take [id number] mg by mouth daily.      Ginkgo Biloba (GINKOBA PO) Take 120 mg by mouth daily.      levothyroxine (SYNTHROID) 125 MCG tablet Take 1 tablet (125 mcg total) by mouth daily. 90 tablet 1    losartan (COZAAR) 50 MG tablet Take 1 tablet (50 mg total) by mouth daily. 90 tablet 3    minocycline (MINOCIN) 50 MG capsule Take 1 capsule (50 mg total) by mouth daily. (Patient taking differently: Take 50 mg by mouth every 12 (twelve) hours.) 90 capsule 2    Omega-3 Fatty Acids (FISH OIL) 1200 MG CPDR Take 1 capsule by mouth daily.      pantoprazole (PROTONIX) 40 MG tablet Take 1 tablet (40 mg total) by mouth daily. 90 tablet 3    Potassium Bicarbonate 99 MG CAPS Take 99 mg by mouth daily.      rOPINIRole (REQUIP) 2 MG tablet Take 2 mg by mouth at bedtime as needed (restless legs).      Study - LIBREXIA-STROKE - milvexian 25 mg or placebo tablet (PI-Sethi) Take 1 tablet by mouth 2 (two) times daily. For Investigational Use Only. Take with or without food at approximately the same time each day. 70 tablet 0    SYMBICORT 160-4.5 MCG/ACT inhaler Inhale 2 puffs into the lungs daily.      vitamin B-12 (CYANOCOBALAMIN) 500 MCG tablet Take 500 mcg by mouth daily.      zinc gluconate 50 MG tablet Take 50 mg by mouth daily.        Vitals   Vitals:   04/01/22 2300 04/02/22 0007 04/02/22 0142 04/02/22 0333  BP: James Kitchen)  116/96  (!) 142/92 (!) 141/77  Pulse: 85  74 80  Resp: (!) 22  16 16  $ Temp:   98 F (36.7 C) 98.9 F (37.2 C)  TempSrc:   Oral Oral  SpO2: 99% 99% 100% 95%  Weight:   83.8 kg   Height:   5' 5"$  (1.651 m)      Body mass index is 30.74 kg/m.  Physical Exam   General: Laying comfortably in bed; in no acute distress.  HENT: Normal oropharynx and mucosa. Normal external appearance of ears and nose.  Neck: Supple, no pain or tenderness  CV: No JVD. No peripheral edema.  Pulmonary: Symmetric Chest rise. Normal respiratory effort.  Abdomen: Soft to touch, non-tender.  Ext: No  cyanosis, edema, or deformity  Skin: No rash. Normal palpation of skin.   Musculoskeletal: Normal digits and nails by inspection. No clubbing.   Neurologic Examination  Mental status/Cognition: Alert, oriented to self, place, month and year, poor attention. Speech/language: Dysarthric speech, non fluent, comprehension intact, object naming intact, repetition intact.  Cranial nerves:   CN II Pupils equal and reactive to light, no VF deficits   CN III,IV,VI EOM intact, no gaze preference or deviation, no nystagmus   CN V normal sensation in V1, V2, and V3 segments bilaterally    CN VII no asymmetry, no nasolabial fold flattening    CN VIII normal hearing to speech    CN IX & X normal palatal elevation, no uvular deviation    CN XI 5/5 head turn and 5/5 shoulder shrug bilaterally    CN XII midline tongue protrusion    Motor:  Muscle bulk: normal, tone normal, pronator drift noted in RUE. tremor none Mvmt Root Nerve  Muscle Right Left Comments  SA C5/6 Ax Deltoid     EF C5/6 Mc Biceps 5 5   EE C6/7/8 Rad Triceps 5 5   WF C6/7 Med FCR     WE C7/8 PIN ECU     F Ab C8/T1 U ADM/FDI 4+ 5   HF L1/2/3 Fem Illopsoas 5 5   KE L2/3/4 Fem Quad 5 5   DF L4/5 D Peron Tib Ant 5 5   PF S1/2 Tibial Grc/Sol 5 5    Sensation:  Light touch Intact throughout   Pin prick    Temperature    Vibration   Proprioception    Coordination/Complex Motor:  - Finger to Nose with past pointing in RUE - Heel to shin intact BL - Rapid alternating movement are normal - Gait: deferred.  Labs   CBC:  Recent Labs  Lab 04/01/22 1755 04/01/22 1758  WBC 7.4  --   NEUTROABS 5.6  --   HGB 13.1 13.6  HCT 39.5 40.0  MCV 95.9  --   PLT 252  --     Basic Metabolic Panel:  Lab Results  Component Value Date   NA 142 04/01/2022   K 4.3 04/01/2022   CO2 25 04/01/2022   GLUCOSE 97 04/01/2022   BUN 25 (H) 04/01/2022   CREATININE 1.10 04/01/2022   CALCIUM 9.4 04/01/2022   GFRNONAA >60 04/01/2022   GFRAA  83 03/11/2020   Lipid Panel:  Lab Results  Component Value Date   LDLCALC 36 03/07/2022   HgbA1c:  Lab Results  Component Value Date   HGBA1C 5.7 (H) 03/06/2022   Urine Drug Screen:     Component Value Date/Time   LABOPIA NONE DETECTED 03/06/2022 1910   COCAINSCRNUR NONE DETECTED 03/06/2022  [place] 03/06/2022 1910   AMPHETMU NONE DETECTED 03/06/2022 [place] DETECTED 03/06/2022 1910   LABBARB NONE DETECTED 03/06/2022 1910    Alcohol Level     Component Value Date/Time   ETH <10 03/06/2022 1457    CT Head without contrast(Personally reviewed): CTH was negative for a large hypodensity concerning for a large territory infarct or hyperdensity concerning for an [place]  CT angio Head and Neck with contrast(Personally reviewed): 1. Redemonstrated chronically occluded left ICA, with reconstitution at the terminus, and chronically occluded right vertebral artery. 2. A previously noted occluded right MCA branch in the area of a likely calcific embolus is now patent. 3. No other intracranial large vessel occlusion or significant stenosis. 4. No additional hemodynamically significant stenosis in the neck. Redemonstrated ulcerated plaque at the right carotid bifurcation and in the proximal right ICA.  MRI Brain(Personally reviewed): 1. Evaluation is limited by motion. Within this limitation, there are scattered foci of restricted diffusion in the bilateral cerebral hemispheres, as well as in the right caudate head and left external capsule, which are new from the prior MRI and likely reflect acute to early subacute infarcts. Given multiple vascular territories, a central embolic etiology is likely. 2. Evolving subacute, evolving infarct in the posterior right frontal and anterior right parietal cortex.  rEEG:  pending  Impression   DEE STEENO is a 77 y.o. male with PMH significant for receurrent strokes/TIA, CRAO, peripheral vascular disease,  HTN, HLD, recent admission for R MCA stroke 2/2 calcified R MCA M2 embolus thought to be secondary to riht ICA therosclerosis and was discharged on Aspirin and plavix and enrolled in [place] trial who presents with intermittent episodes of confusion/word finding difficulty x 3. Unable to get in touch with care taker to clarify and patient is hard of hearing and unsure of what his symptoms were.  Exam with mild RUE weakness and RUE past pointing. MRI demonstrated BL embolic appearing infarct.  Etiology of infarcts, unclear.  Recommendations   - Frequent Neuro checks per stroke unit protocol - recent TTE was negative. No need to repeat. Will likely need loop recorder. - Recommend obtaining Lipid panel with LDL - Please start statin if LDL > 70 - Recommend HbA1c to evaluate for diabetes and how well it is controlled. - Antithrombotic - continue Asprin + plavix for now along with Librexia trial. Further, pending loop recorder - Recommend DVT ppx - SBP goal - permissive hypertension first 24 h < 220/110. Held home meds.  - Recommend Telemetry monitoring for arrythmia - Recommend bedside swallow screen prior to PO intake. - Stroke education booklet - Recommend PT/OT/SLP consult  ______________________________________________________________________   Thank you for the opportunity to take part in the care of this patient. If you have any further questions, please contact the neurology consultation attending.  Signed,  [place] Pager Number [phone] _ _ _   _ __   _ __ _ _  __ __   _ __   __ _

## 2022-04-02 NOTE — ED Notes (Signed)
Pt resting in bed with no acute distress at this time.

## 2022-04-02 NOTE — Evaluation (Signed)
Physical Therapy Evaluation & Discharge Patient Details Name: James Richards MRN: [id number] DOB: [birth year] Today's Date: 04/02/2022  History of Present Illness  Patient is a 77 yo male presenting to the ED with sudden onset of generalized weakness, confusion and slurred speech that lasted about 30 min per wife on 04/01/22. She states this is the 3rd TIA this week. He had a recent ischemic stroke 2 weeks ago with embolectomy. MRI on 04/01/22 showing: scattered foci of restricted diffusion in the bilateral cerebral hemispheres, as well as in the right caudate head and left external  Capsule and Evolving subacute, evolving infarct in the posterior right frontal and anterior right parietal cortex. 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   Clinical Impression  Pt presents with condition above. PTA, he was living with his cousin in a house with 2-3 STE and was independent without AD for functional mobility but appearing to be receiving assistance for cognitive tasks, like managing his medications. Currently, pt displays fairly symmetrical and intact/WFL bil lower extremity strength and coordination. He was able to ambulate without UE support and navigate stairs without LOB or physical assistance, demonstrating only some mild balance deficits which likely was present at baseline. Pt demonstrates deficits primarily in cognition, needing max cues and reliant on therapist for path finding this date. Recommending d/c home with 24/7 supervision/assistance, primarily for cognitive tasks. No further acute PT needs identified and all education completed and questions answered. PT will sign off.     Recommendations for follow up therapy are one component of a multi-disciplinary discharge planning process, led by the attending physician.  Recommendations may be  updated based on patient status, additional functional criteria and insurance authorization.  Follow Up Recommendations No PT follow up      Assistance Recommended at Discharge Frequent or constant Supervision/Assistance  Patient can return home with the following  Assistance with cooking/housework;Direct supervision/assist for medications management;Direct supervision/assist for financial management;Assist for transportation    Equipment Recommendations None recommended by PT  Recommendations for Other Services       Functional Status Assessment Patient has not had a recent decline in their functional status     Precautions / Restrictions Precautions Precautions: Fall (low) Restrictions Weight Bearing Restrictions: No      Mobility  Bed Mobility Overal bed mobility: Modified Independent Bed Mobility: Supine to Sit     Supine to sit: Modified independent (Device/Increase time), HOB elevated     General bed mobility comments: No assistance needed    Transfers Overall transfer level: Needs assistance Equipment used: None Transfers: Sit to/from Stand Sit to Stand: Supervision, +2 safety/equipment           General transfer comment: Supervision for safety, but comes to stand quickly without LOB    Ambulation/Gait Ambulation/Gait assistance: Min guard, Supervision Gait Distance (Feet): 425 Feet (x2 bouts of ~425 ft > ~25 ft) Assistive device: None Gait Pattern/deviations: Step-through pattern, Decreased stride length, Wide base of support Gait velocity: dec Gait velocity interpretation: >2.62 ft/sec, indicative of community ambulatory   General Gait Details: Pt with slightly wide BOS, but no overt LOB  Stairs Stairs: Yes Stairs assistance: Min guard Stair Management: One rail Right, Two rails, Alternating pattern, Forwards Number of Stairs: 10 (x4 standard height, x6 short stairs) General stair comments: Pt navigating stairs with reciprocal pattern and  alternating between 1-2 rail use, no  LOB, min guard for safety  Wheelchair Mobility    Modified Rankin (Stroke Patients Only) Modified Rankin (Stroke Patients Only) Pre-Morbid Rankin Score: Moderate disability Modified Rankin: Moderate disability     Balance Overall balance assessment: Mild deficits observed, not formally tested                                           Pertinent Vitals/Pain Pain Assessment Pain Assessment: Faces Faces Pain Scale: No hurt Pain Intervention(s): Monitored during session    Home Living Family/patient expects to be discharged to:: Private residence Living Arrangements: Other relatives (cousin) Available Help at Discharge: Family;Available 24 hours/day Type of Home: House Home Access: Stairs to enter Entrance Stairs-Rails:  (no rail but a post) Technical brewer of Steps: 2-3   Home Layout: Laundry or work area in basement (does not need to go in basement though) Home Equipment: Conservation officer, nature (2 wheels);Cane - single point      Prior Function Prior Level of Function : Independent/Modified Independent             Mobility Comments: no AD ADLs Comments: Was not driving recently, cousin assists pt with meds     Hand Dominance        Extremity/Trunk Assessment   Upper Extremity Assessment Upper Extremity Assessment: Defer to OT evaluation    Lower Extremity Assessment Lower Extremity Assessment: Overall WFL for tasks assessed (bil lower extremities grossly 5/5 with MMT; denied numbness/tingling bil)    Cervical / Trunk Assessment Cervical / Trunk Assessment: Normal  Communication   Communication: Expressive difficulties;Other (comment);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 Pt with poor problem-solving, unable to figure out how to use signs to find his room number or acknowledgement that he was going down in numbers when he needed to go up. Needed max cues and pt dependent on therapist for path finding his room        General Comments      Exercises     Assessment/Plan    PT Assessment Patient does not need any further PT services  PT Problem List Decreased balance;Decreased mobility;Decreased cognition;Decreased safety awareness       PT Treatment Interventions DME instruction;Gait training;Stair training;Functional mobility training;Therapeutic activities;Therapeutic exercise;Balance training;Neuromuscular re-education;Cognitive remediation;Patient/family education    PT Goals (Current goals can be found in the Care Plan section)  Acute Rehab PT Goals Patient Stated Goal: did not state PT Goal Formulation: With patient Time For Goal Achievement: 04/03/22 Potential to Achieve Goals: Good    Frequency Min 3X/week     Co-evaluation PT/OT/SLP Co-Evaluation/Treatment: Yes Reason for Co-Treatment: For patient/therapist safety;To address functional/ADL transfers PT goals addressed during session: Mobility/safety with mobility;Balance         AM-PAC PT "6 Clicks" Mobility  Outcome Measure Help needed turning from your back to your side while in a flat bed without using bedrails?: None Help needed moving  from lying on your back to sitting on the side of a flat bed without using bedrails?: None Help needed moving to and from a bed to a chair (including a wheelchair)?: A Little Help needed standing up  from a chair using your arms (e.g., wheelchair or bedside chair)?: A Little Help needed to walk in hospital room?: A Little Help needed climbing 3-5 steps with a railing? : A Little 6 Click Score: 20    End of Session Equipment Utilized During Treatment: Gait belt Activity Tolerance: Patient tolerated treatment well Patient left: in chair;with call bell/phone within reach;with chair alarm set   PT Visit Diagnosis: Other abnormalities of gait and mobility (R26.89);Unsteadiness on feet (R26.81)    Time: [id number] PT Time Calculation (min) (ACUTE ONLY): 18 min   Charges:   PT Evaluation $PT Eval Low Complexity: 1 Low          Moishe Spice, PT, DPT Acute Rehabilitation Services  Office: [phone]   Orvan Falconer 04/02/2022, 11:40 AM

## 2022-04-02 NOTE — Procedures (Signed)
Patient Name: James Richards  MRN: [id number]  Epilepsy Attending: Lora Havens  Referring Physician/Provider: Donnetta Simpers, MD  Date: 04/02/2022 Duration: 27.40 mins  Patient history: 77 y.o. male with PMH significant for receurrent strokes/TIA, CRAO, peripheral vascular disease, HTN, HLD, recent admission for R MCA stroke 2/2 calcified R MCA M2 embolus thought to be secondary to riht ICA therosclerosis and was discharged on Aspirin and plavix and enrolled in [place] trial who presents with intermittent episodes of confusion/word finding difficulty x 3. EEG to evaluate for seizure  Level of alertness: Awake  AEDs during EEG study: None  Technical aspects: This EEG study was done with scalp electrodes positioned according to the 10-20 International system of electrode placement. Electrical activity was reviewed with band pass filter of 1-70Hz$ , sensitivity of 7 uV/mm, display speed of 71m/sec with a 60Hz$  notched filter applied as appropriate. EEG data were recorded continuously and digitally stored.  Video monitoring was available and reviewed as appropriate.  Description: The posterior dominant rhythm consists of 8 Hz activity of moderate voltage (25-35 uV) seen predominantly in posterior head regions, symmetric and reactive to eye opening and eye closing. EEG showed intermittent generalized 5 to 6 Hz theta slowing. Physiologic photic driving was not seen during photic stimulation.  Hyperventilation was not performed.     ABNORMALITY - Intermittent slow, generalized  IMPRESSION: This study is suggestive of mild diffuse encephalopathy, nonspecific etiology. No seizures or epileptiform discharges were seen throughout the recording.  Please note lack of epileptiform activity during interictal EEG does not exclude the diagnosis of epilepsy.  Finnleigh Marchetti OBarbra Sarks

## 2022-04-03 ENCOUNTER — Inpatient Hospital Stay (HOSPITAL_COMMUNITY): Payer: Medicare PPO

## 2022-04-03 DIAGNOSIS — I471 Supraventricular tachycardia, unspecified: Secondary | ICD-10-CM | POA: Diagnosis not present

## 2022-04-03 DIAGNOSIS — I639 Cerebral infarction, unspecified: Secondary | ICD-10-CM | POA: Diagnosis not present

## 2022-04-03 LAB — BASIC METABOLIC PANEL
Anion gap: 9 (ref 5–15)
BUN: 11 mg/dL (ref 8–23)
CO2: 24 mmol/L (ref 22–32)
Calcium: 9 mg/dL (ref 8.9–10.3)
Chloride: 105 mmol/L (ref 98–111)
Creatinine, Ser: 1.01 mg/dL (ref 0.61–1.24)
GFR, Estimated: >60 mL/min (ref >60)
Glucose, Bld: 90 mg/dL (ref 70–99)
Potassium: 4 mmol/L (ref 3.5–5.1)
Sodium: 138 mmol/L (ref 135–145)

## 2022-04-03 LAB — CBC
HCT: 38.1 % — ABNORMAL LOW (ref 39.0–52.0)
Hemoglobin: 12.9 g/dL — ABNORMAL LOW (ref 13.0–17.0)
MCH: 31.2 pg (ref 26.0–34.0)
MCHC: 33.9 g/dL (ref 30.0–36.0)
MCV: 92 fL (ref 80.0–100.0)
Platelets: 233 10*3/uL (ref 150–400)
RBC: 4.14 MIL/uL — ABNORMAL LOW (ref 4.22–5.81)
RDW: 14.6 % (ref 11.5–15.5)
WBC: 5.6 10*3/uL (ref 4.0–10.5)
nRBC: 0 % (ref 0.0–0.2)

## 2022-04-03 MED ORDER — PANTOPRAZOLE SODIUM 40 MG PO TBEC
40.0000 mg | DELAYED_RELEASE_TABLET | Freq: Every day | ORAL | Status: DC
  Administered 2022-04-03 – 2022-04-04 (×2): 40 mg via ORAL
  Filled 2022-04-03 (×2): qty 1

## 2022-04-03 MED ORDER — METOPROLOL TARTRATE 12.5 MG HALF TABLET
12.5000 mg | ORAL_TABLET | Freq: Two times a day (BID) | ORAL | Status: DC
  Administered 2022-04-03 – 2022-04-04 (×3): 12.5 mg via ORAL
  Filled 2022-04-03 (×3): qty 1

## 2022-04-03 MED ORDER — CYANOCOBALAMIN 500 MCG PO TABS
500.0000 ug | ORAL_TABLET | Freq: Every day | ORAL | Status: DC
  Administered 2022-04-03 – 2022-04-04 (×2): 500 ug via ORAL
  Filled 2022-04-03 (×2): qty 1
  Filled 2022-04-03: qty 0.5

## 2022-04-03 MED ORDER — LEVOTHYROXINE SODIUM 25 MCG PO TABS
125.0000 ug | ORAL_TABLET | Freq: Every day | ORAL | Status: DC
  Administered 2022-04-03 – 2022-04-04 (×2): 125 ug via ORAL
  Filled 2022-04-03 (×2): qty 1

## 2022-04-03 MED ORDER — ROPINIROLE HCL 1 MG PO TABS
2.0000 mg | ORAL_TABLET | Freq: Every day | ORAL | Status: DC
  Administered 2022-04-03: 2 mg via ORAL
  Filled 2022-04-03: qty 2

## 2022-04-03 MED ORDER — TICAGRELOR 90 MG PO TABS
90.0000 mg | ORAL_TABLET | Freq: Two times a day (BID) | ORAL | Status: DC
  Administered 2022-04-03 – 2022-04-04 (×2): 90 mg via ORAL
  Filled 2022-04-03 (×2): qty 1

## 2022-04-03 NOTE — Progress Notes (Signed)
Bilateral lower extremity venous duplex has been completed. Preliminary results can be found in CV Proc through chart review.   04/03/22 10:47 AM James Richards RVT

## 2022-04-03 NOTE — Progress Notes (Signed)
STROKE TEAM PROGRESS NOTE   SUBJECTIVE (INTERVAL HISTORY) James Richards is at the bedside. Pt is now at his baseline. Speech resolved, however still has significant hearing loss and needed his cousin to relay information to him most of the time. They are in agreement of loop recorder. Since he has stroke this time, will stop [place] trial medication.    OBJECTIVE Temp:  [98.1 F (36.7 C)-99 F (37.2 C)] 98.8 F (37.1 C) (02/18 0747) Pulse Rate:  [63-91] 63 (02/18 0747) Cardiac Rhythm: Heart block (02/18 0900) Resp:  [12-20] 18 (02/18 0747) BP: (103-155)/(60-78) 125/75 (02/18 0747) SpO2:  [95 %-100 %] 100 % (02/18 0747)  No results for input(s): "GLUCAP" in the last 168 hours. Recent Labs  Lab 04/01/22 1755 04/01/22 1758 04/03/22 0344  NA 142 142 138  K 4.3 4.3 4.0  CL 106 105 105  CO2 25  --  24  GLUCOSE 101* 97 90  BUN 21 25* 11  CREATININE 1.19 1.10 1.01  CALCIUM 9.4  --  9.0   Recent Labs  Lab 04/01/22 1755  AST 31  ALT 31  ALKPHOS 78  BILITOT 0.4  PROT 6.4*  ALBUMIN 3.5   Recent Labs  Lab 04/01/22 1755 04/01/22 1758 04/03/22 0344  WBC 7.4  --  5.6  NEUTROABS 5.6  --   --   HGB 13.1 13.6 12.9*  HCT 39.5 40.0 38.1*  MCV 95.9  --  92.0  PLT 252  --  233   No results for input(s): "CKTOTAL", "CKMB", "CKMBINDEX", "TROPONINI" in the last 168 hours. Recent Labs    04/01/22 1755  LABPROT 13.4  INR 1.0   No results for input(s): "COLORURINE", "LABSPEC", "PHURINE", "GLUCOSEU", "HGBUR", "BILIRUBINUR", "KETONESUR", "PROTEINUR", "UROBILINOGEN", "NITRITE", "LEUKOCYTESUR" in the last 72 hours.  Invalid input(s): "APPERANCEUR"     Component Value Date/Time   CHOL 98 04/02/2022 0432   CHOL 102 12/27/2021 1153   TRIG 45 04/02/2022 0432   HDL 46 04/02/2022 0432   HDL 45 12/27/2021 1153   CHOLHDL 2.1 04/02/2022 0432   VLDL 9 04/02/2022 0432   LDLCALC 43 04/02/2022 0432   LDLCALC 42 12/27/2021 1153   Lab Results  Component Value Date   HGBA1C 5.7 (H) 03/06/2022       Component Value Date/Time   LABOPIA NONE DETECTED 03/06/2022 1910   COCAINSCRNUR NONE DETECTED 03/06/2022 1910   LABBENZ NONE DETECTED 03/06/2022 1910   AMPHETMU NONE DETECTED 03/06/2022 1910   THCU NONE DETECTED 03/06/2022 1910   LABBARB NONE DETECTED 03/06/2022 1910    Recent Labs  Lab 04/02/22 0432  ETH <10    I have personally reviewed the radiological images below and agree with the radiology interpretations.  VAS [place] LOWER EXTREMITY VENOUS (DVT)  Result Date: 04/03/2022  Lower Venous DVT Study Patient Name:  James Richards [facility]  Date of Exam:   04/03/2022 Medical Rec #: [id number]      Accession #:    [id number] Date of Birth: [birth year]     Patient Gender: M Patient Age:   77 years Exam Location:  [facility] Procedure:      VAS [place] LOWER EXTREMITY VENOUS (DVT) Referring Phys: Cornelius Moras Kathrine Rieves --------------------------------------------------------------------------------  Indications: Stroke.  Risk Factors: None identified. Limitations: Poor ultrasound/tissue interface. Comparison Study: No prior studies. Performing Technologist: Oliver Hum RVT  Examination Guidelines: A complete evaluation includes B-mode imaging, spectral Doppler, color Doppler, and power Doppler as needed of all accessible portions of each vessel. Bilateral testing is considered an integral  part of a complete examination. Limited examinations for reoccurring indications may be performed as noted. The reflux portion of the exam is performed with the patient in reverse Trendelenburg.  +---------+---------------+---------+-----------+----------+--------------+ RIGHT    CompressibilityPhasicitySpontaneityPropertiesThrombus Aging +---------+---------------+---------+-----------+----------+--------------+ CFV      Full           Yes      Yes                                 +---------+---------------+---------+-----------+----------+--------------+ SFJ      Full                                                         +---------+---------------+---------+-----------+----------+--------------+ FV Prox  Full                                                        +---------+---------------+---------+-----------+----------+--------------+ FV Mid   Full                                                        +---------+---------------+---------+-----------+----------+--------------+ FV DistalFull                                                        +---------+---------------+---------+-----------+----------+--------------+ PFV      Full                                                        +---------+---------------+---------+-----------+----------+--------------+ POP      Full           Yes      Yes                                 +---------+---------------+---------+-----------+----------+--------------+ PTV      Full                                                        +---------+---------------+---------+-----------+----------+--------------+ PERO     Full                                                        +---------+---------------+---------+-----------+----------+--------------+   +---------+---------------+---------+-----------+----------+--------------+ LEFT     CompressibilityPhasicitySpontaneityPropertiesThrombus Aging +---------+---------------+---------+-----------+----------+--------------+ CFV  Full           Yes      Yes                                 +---------+---------------+---------+-----------+----------+--------------+ SFJ      Full                                                        +---------+---------------+---------+-----------+----------+--------------+ FV Prox  Full                                                        +---------+---------------+---------+-----------+----------+--------------+ FV Mid   Full                                                         +---------+---------------+---------+-----------+----------+--------------+ FV DistalFull                                                        +---------+---------------+---------+-----------+----------+--------------+ PFV      Full                                                        +---------+---------------+---------+-----------+----------+--------------+ POP      Full           Yes      Yes                                 +---------+---------------+---------+-----------+----------+--------------+ PTV      Full                                                        +---------+---------------+---------+-----------+----------+--------------+ PERO     Full                                                        +---------+---------------+---------+-----------+----------+--------------+    Summary: RIGHT: - There is no evidence of deep vein thrombosis in the lower extremity.  - No cystic structure found in the popliteal fossa.  LEFT: - There is no evidence of deep vein thrombosis in the lower extremity.  - No cystic structure found in the popliteal fossa.  *See table(s) above for measurements and observations.  Preliminary    CT CHEST ABDOMEN PELVIS W CONTRAST  Result Date: 04/02/2022 CLINICAL DATA:  Occult malignancy? EXAM: CT CHEST, ABDOMEN, AND PELVIS WITH CONTRAST TECHNIQUE: Multidetector CT imaging of the chest, abdomen and pelvis was performed following the standard protocol during bolus administration of intravenous contrast. RADIATION DOSE REDUCTION: This exam was performed according to the departmental dose-optimization program which includes automated exposure control, adjustment of the mA and/or kV according to patient size and/or use of iterative reconstruction technique. CONTRAST:  100 mL OMNIPAQUE IOHEXOL 350 MG/ML SOLN COMPARISON:  CT chest 05/14/2021 FINDINGS: CT CHEST FINDINGS Cardiovascular: No significant vascular findings. Normal heart size. No  pericardial effusion. Atheromatous calcifications coronary arteries and aorta. Mediastinum/Nodes: No enlarged mediastinal, hilar, or axillary lymph nodes. Thyroid gland is not visualized. Other mediastinal structures including trachea and esophagus are grossly unremarkable. Large paraesophageal hiatal hernia. Lungs/Pleura: Dependent bibasilar subsegmental atelectasis. No pulmonary edema or pneumonia identified. Musculoskeletal: No chest wall mass or suspicious bone lesions identified. CT ABDOMEN PELVIS FINDINGS Hepatobiliary: Subcentimeter hepatic cyst right lobe posteriorly at the dome and no biliary ductal dilatation. Unremarkable gallbladder which contains excreted contrast. Pancreas: Unremarkable. No pancreatic ductal dilatation or surrounding inflammatory changes. Spleen: Normal in size without focal abnormality. Adrenals/Urinary Tract: Adrenal glands are unremarkable. Right kidney 1.6 cm cyst. This does not need to be evaluated any further. No hydronephrosis. No nephrolithiasis. No ureteral stones. Urinary bladder contains excreted contrast. Stomach/Bowel: No bowel dilatation. No evidence for obstruction. Oral contrast progressed to the splenic flexure. Stomach is within normal limits. Appendix appears normal. No evidence of bowel wall thickening, distention, or inflammatory changes. Vascular/Lymphatic: Aortic atherosclerosis. No enlarged abdominal or pelvic lymph nodes. Reproductive: Prostate is unremarkable. Other: No abdominal wall hernia or abnormality. No abdominopelvic ascites. Musculoskeletal: No acute or significant osseous findings. IMPRESSION: 1. Large paraesophageal hiatal hernia. 2. Right kidney cyst. 3. Hepatic cysts. 4. No acute abdominal or pelvic pathology and no cardiopulmonary process. Electronically Signed   By: Sammie Bench M.D.   On: 04/02/2022 14:42   EEG adult  Result Date: 04/02/2022 Lora Havens, MD     04/02/2022  8:43 AM Patient Name: ANTWAND AROSTEGUI MRN: [id number] Epilepsy  Attending: Lora Havens Referring Physician/Provider: Donnetta Simpers, MD Date: 04/02/2022 Duration: 27.40 mins Patient history: 77 y.o. male with PMH significant for receurrent strokes/TIA, CRAO, peripheral vascular disease, HTN, HLD, recent admission for R MCA stroke 2/2 calcified R MCA M2 embolus thought to be secondary to riht ICA therosclerosis and was discharged on Aspirin and plavix and enrolled in [place] trial who presents with intermittent episodes of confusion/word finding difficulty x 3. EEG to evaluate for seizure Level of alertness: Awake AEDs during EEG study: None Technical aspects: This EEG study was done with scalp electrodes positioned according to the 10-20 International system of electrode placement. Electrical activity was reviewed with band pass filter of 1-70Hz$ , sensitivity of 7 uV/mm, display speed of 58m/sec with a 60Hz$  notched filter applied as appropriate. EEG data were recorded continuously and digitally stored.  Video monitoring was available and reviewed as appropriate. Description: The posterior dominant rhythm consists of 8 Hz activity of moderate voltage (25-35 uV) seen predominantly in posterior head regions, symmetric and reactive to eye opening and eye closing. EEG showed intermittent generalized 5 to 6 Hz theta slowing. Physiologic photic driving was not seen during photic stimulation.  Hyperventilation was not performed.   ABNORMALITY - Intermittent slow, generalized IMPRESSION: This study is suggestive of mild diffuse encephalopathy, nonspecific etiology. No seizures or epileptiform  discharges were seen throughout the recording. Please note lack of epileptiform activity during interictal EEG does not exclude the diagnosis of epilepsy. Lora Havens   CT ANGIO HEAD NECK W WO CM  Result Date: 04/02/2022 CLINICAL DATA:  Sudden onset generalized weakness EXAM: CT ANGIOGRAPHY HEAD AND NECK TECHNIQUE: Multidetector CT imaging of the head and neck was performed using  the standard protocol during bolus administration of intravenous contrast. Multiplanar CT image reconstructions and MIPs were obtained to evaluate the vascular anatomy. Carotid stenosis measurements (when applicable) are obtained utilizing NASCET criteria, using the distal internal carotid diameter as the denominator. RADIATION DOSE REDUCTION: This exam was performed according to the departmental dose-optimization program which includes automated exposure control, adjustment of the mA and/or kV according to patient size and/or use of iterative reconstruction technique. CONTRAST:  42m OMNIPAQUE IOHEXOL 350 MG/ML SOLN COMPARISON:  03/06/2022 CTA head and neck, correlation is also made with CT head 04/01/2022 FINDINGS: CT HEAD FINDINGS For noncontrast findings, please see 04/01/2022 CT head. CTA NECK FINDINGS Aortic arch: 3 arch, with a common origin of the brachiocephalic and left common carotid arteries and aortic origin of the left vertebral artery. Imaged portion shows no evidence of aneurysm or dissection. No significant stenosis of the major arch vessel origins. Right carotid system: No evidence of dissection, occlusion, or hemodynamically significant stenosis (greater than 50%). Redemonstrated ulcerated plaque at the bifurcation and in the proximal right ICA. Left carotid system: Redemonstrated occlusion of the left ICA at its origin. The left CCA and ECA are patent. Vertebral arteries: Chronically occluded right vertebral artery. The left vertebral artery is patent, without significant stenosis, dissection, or occlusion. Skeleton: No acute osseous abnormality. Other neck: No acute finding. Upper chest: No focal pulmonary opacity or pleural effusion. Review of the MIP images confirms the above findings CTA HEAD FINDINGS Anterior circulation: Redemonstrated chronically occluded left ICA, with reconstitution at the terminus. The right ICA is patent to the terminus. A1 segments patent. Normal anterior  communicating artery. Anterior cerebral arteries are patent to their distal aspects. No M1 stenosis or occlusion. A previously noted occluded right MCA branch in the area of a likely calcific embolus (series 7, image 87), is now patent (series 7, image 84). To their MCA branches perfused distal aspects. Posterior circulation: Chronically occluded right vertebral artery. The left vertebral artery is patent to the vertebrobasilar junction. The left PICA is patent proximally. Basilar patent to its distal aspect. Superior cerebellar arteries patent proximally. Fetal origin of the left PCA and near fetal origin of the right PCA. The PCAs are patent to their distal aspects without significant stenosis. Venous sinuses: Not well opacified. Anatomic variants: Fetal origin of the left PCA and near fetal origin of the right PCA. Review of the MIP images confirms the above findings IMPRESSION: 1. Redemonstrated chronically occluded left ICA, with reconstitution at the terminus, and chronically occluded right vertebral artery. 2. A previously noted occluded right MCA branch in the area of a likely calcific embolus is now patent. 3. No other intracranial large vessel occlusion or significant stenosis. 4. No additional hemodynamically significant stenosis in the neck. Redemonstrated ulcerated plaque at the right carotid bifurcation and in the proximal right ICA. Electronically Signed   By: AMerilyn BabaM.D.   On: 04/02/2022 00:26   MR BRAIN WO CONTRAST  Result Date: 04/01/2022 CLINICAL DATA:  Transient episodes of confusion and aphasia, recent stroke EXAM: MRI HEAD WITHOUT CONTRAST TECHNIQUE: Multiplanar, multiecho pulse sequences of the brain and surrounding structures  were obtained without intravenous contrast. COMPARISON:  03/07/2022 MRI head, correlation is also made with 04/01/2022 CT head FINDINGS: Evaluation is limited by motion brain: Scattered, primarily cortical foci of restricted diffusion with ADC correlates in  the bilateral cerebral hemispheres (series 2, images 21-23, 26-29, 34-35, 37-41, 43-44, and 46), as well as in the right caudate head and left external capsule (series 2, image 31), which are new from the prior MRI and likely reflect represent acute to subacute infarcts. Some restricted diffusion with diminished ADC correlate remains in the area of prior infarct in the posterior right frontal and anterior right parietal cortex, consistent with subacute, evolving infarct. No acute hemorrhage, mass, mass effect, or midline shift. No hydrocephalus or definite extra-axial collection. Redemonstrated remote infarcts in the left corona radiata, bilateral parietal cortex, and posterior right temporal cortex. Vascular: Limited evaluation secondary to motion. Skull and upper cervical spine: Limited evaluation secondary to motion. Sinuses/Orbits: No acute finding. Other: Fluid in bilateral mastoid air cells. IMPRESSION: 1. Evaluation is limited by motion. Within this limitation, there are scattered foci of restricted diffusion in the bilateral cerebral hemispheres, as well as in the right caudate head and left external capsule, which are new from the prior MRI and likely reflect acute to early subacute infarcts. Given multiple vascular territories, a central embolic etiology is likely. 2. Evolving subacute, evolving infarct in the posterior right frontal and anterior right parietal cortex. These results were called by telephone at the time of interpretation on 04/01/2022 at 11:00 pm to provider MATTHEW TRIFAN , who verbally acknowledged these results. Electronically Signed   By: Merilyn Baba M.D.   On: 04/01/2022 23:00   CT HEAD WO CONTRAST  Result Date: 04/01/2022 CLINICAL DATA:  Neuro deficit, acute, stroke suspected EXAM: CT HEAD WITHOUT CONTRAST TECHNIQUE: Contiguous axial images were obtained from the base of the skull through the vertex without intravenous contrast. RADIATION DOSE REDUCTION: This exam was performed  according to the departmental dose-optimization program which includes automated exposure control, adjustment of the mA and/or kV according to patient size and/or use of iterative reconstruction technique. COMPARISON:  CT head 03/06/2022, MRI head 03/29/2021 FINDINGS: Brain: Cerebral ventricle sizes are concordant with the degree of cerebral volume loss. Patchy and confluent areas of decreased attenuation are noted throughout the deep and periventricular white matter of the cerebral hemispheres bilaterally, compatible with chronic microvascular ischemic disease. Left frontoparietal and anterior temporal lobe encephalomalacia. Right temporal lobe encephalomalacia. No evidence of large-territorial acute infarction. No parenchymal hemorrhage. No mass lesion. No extra-axial collection. No mass effect or midline shift. No hydrocephalus. Basilar cisterns are patent. Vascular: No hyperdense vessel. Atherosclerotic calcifications are present within the cavernous internal carotid arteries. Skull: No acute fracture or focal lesion. Sinuses/Orbits: Paranasal sinuses and mastoid air cells are clear. The orbits are unremarkable. Other: None. IMPRESSION: No acute intracranial abnormality in a patient with chronic bilateral cerebral infarction. Electronically Signed   By: Iven Finn M.D.   On: 04/01/2022 19:11   IR CT Head Ltd  Result Date: 03/09/2022 INDICATION: New onset of right gaze deviation and occluded inferior division of the right middle cerebral artery in the M3 region. EXAM: 1. EMERGENT LARGE VESSEL OCCLUSION THROMBOLYSIS (anterior CIRCULATION) COMPARISON:  CT angiogram of the head and neck 03/06/2022. MEDICATIONS: Ancef 2 g IV antibiotic was administered within 1 hour of the procedure. ANESTHESIA/SEDATION: General anesthesia. CONTRAST:  Omnipaque 300 25 cc. FLUOROSCOPY TIME:  Fluoroscopy Time: 5 minutes 6 seconds (724 mGy). COMPLICATIONS: None immediate. TECHNIQUE: Following a full  explanation of the procedure  along with the potential associated complications, an informed witnessed consent was obtained. The risks of intracranial hemorrhage of 10%, worsening neurological deficit, ventilator dependency, death and inability to revascularize were all reviewed in detail with the patient's sister. The patient was then put under general anesthesia by the Department of Anesthesiology at [facility]. The right groin was prepped and draped in the usual sterile fashion. Thereafter using modified Seldinger technique, transfemoral access into the right common femoral artery was obtained without difficulty. Over a 0.035 inch guidewire an 8 French 25 cm Pinnacle sheath was inserted. Through this, and also over a 0.035 inch guidewire a 5 [place] JB 1 catheter was advanced to the aortic arch region and selectively positioned in the right common carotid artery. FINDINGS: The right common carotid arteriogram demonstrates the right external carotid artery to be mild narrowed at its origin. Its branches opacify normally. The right internal carotid artery at the bulb demonstrates a focal outpouching from its anterior wall. No intraluminal filling defects are evident. There is moderate tortuosity of the proximal 1/3 of the right internal carotid artery. More distally, the vessel is seen to opacify to the cranial skull base. The petrous, the cavernous and the supraclinoid right ICA demonstrate wide patency. The right anterior cerebral artery opacifies into the capillary and venous phases. Prompt opacification of the left anterior cerebral artery and the left middle cerebral artery distribution is noted via the anterior communicating artery from the right. The right middle cerebral artery demonstrates inferior division to be widely patent. The superior division demonstrates a focal area of caliber narrowing of the mid perisylvian branch in the M3 segment with partial distal reconstitution. Caliber of the M3 region of the superior division  was too small for aspiration thrombectomy, or for stent retriever procedures given the high-risk of perforation associated with the calcified embolus. The balloon guide was removed. An 8 French Angio-Seal closure device was applied for hemostasis at the right groin puncture site. Distal pulses remained Dopplerable in both feet unchanged from prior to the procedure. Patient was extubated. Upon recovery, the patient remained agitated in simple commands. Minimal movement was seen in the left upper extremity. Patient moved the left lower and the right upper and lower extremities spontaneously. He was then returned to the neuro ICU for further management. PROCEDURE: As above. IMPRESSION: Angiographic occlusion of a right middle cerebral perisylvian superior division branch in the M3 region. PLAN: As per referring MD. Electronically Signed   By: Luanne Bras M.D.   On: 03/09/2022 08:42   IR PERCUTANEOUS ART THROMBECTOMY/INFUSION INTRACRANIAL INC DIAG ANGIO  Result Date: 03/09/2022 INDICATION: New onset of right gaze deviation and occluded inferior division of the right middle cerebral artery in the M3 region. EXAM: 1. EMERGENT LARGE VESSEL OCCLUSION THROMBOLYSIS (anterior CIRCULATION) COMPARISON:  CT angiogram of the head and neck 03/06/2022. MEDICATIONS: Ancef 2 g IV antibiotic was administered within 1 hour of the procedure. ANESTHESIA/SEDATION: General anesthesia. CONTRAST:  Omnipaque 300 25 cc. FLUOROSCOPY TIME:  Fluoroscopy Time: 5 minutes 6 seconds (724 mGy). COMPLICATIONS: None immediate. TECHNIQUE: Following a full explanation of the procedure along with the potential associated complications, an informed witnessed consent was obtained. The risks of intracranial hemorrhage of 10%, worsening neurological deficit, ventilator dependency, death and inability to revascularize were all reviewed in detail with the patient's sister. The patient was then put under general anesthesia by the Department of  Anesthesiology at [facility]. The right groin was prepped and draped  in the usual sterile fashion. Thereafter using modified Seldinger technique, transfemoral access into the right common femoral artery was obtained without difficulty. Over a 0.035 inch guidewire an 8 French 25 cm Pinnacle sheath was inserted. Through this, and also over a 0.035 inch guidewire a 5 [place] JB 1 catheter was advanced to the aortic arch region and selectively positioned in the right common carotid artery. FINDINGS: The right common carotid arteriogram demonstrates the right external carotid artery to be mild narrowed at its origin. Its branches opacify normally. The right internal carotid artery at the bulb demonstrates a focal outpouching from its anterior wall. No intraluminal filling defects are evident. There is moderate tortuosity of the proximal 1/3 of the right internal carotid artery. More distally, the vessel is seen to opacify to the cranial skull base. The petrous, the cavernous and the supraclinoid right ICA demonstrate wide patency. The right anterior cerebral artery opacifies into the capillary and venous phases. Prompt opacification of the left anterior cerebral artery and the left middle cerebral artery distribution is noted via the anterior communicating artery from the right. The right middle cerebral artery demonstrates inferior division to be widely patent. The superior division demonstrates a focal area of caliber narrowing of the mid perisylvian branch in the M3 segment with partial distal reconstitution. Caliber of the M3 region of the superior division was too small for aspiration thrombectomy, or for stent retriever procedures given the high-risk of perforation associated with the calcified embolus. The balloon guide was removed. An 8 French Angio-Seal closure device was applied for hemostasis at the right groin puncture site. Distal pulses remained Dopplerable in both feet unchanged from prior to the  procedure. Patient was extubated. Upon recovery, the patient remained agitated in simple commands. Minimal movement was seen in the left upper extremity. Patient moved the left lower and the right upper and lower extremities spontaneously. He was then returned to the neuro ICU for further management. PROCEDURE: As above. IMPRESSION: Angiographic occlusion of a right middle cerebral perisylvian superior division branch in the M3 region. PLAN: As per referring MD. Electronically Signed   By: Luanne Bras M.D.   On: 03/09/2022 08:42   ECHOCARDIOGRAM COMPLETE  Result Date: 03/07/2022    ECHOCARDIOGRAM REPORT   Patient Name:   NAVIAN AGUILA Date of Exam: 03/07/2022 Medical Rec #:  [id number]     Height:       65.0 in Accession #:    [id number]    Weight:       183.0 lb Date of Birth:  1945-04-21    BSA:          1.905 m Patient Age:    81 years      BP:           116/85 mmHg Patient Gender: M             HR:           83 bpm. Exam Location:  Inpatient Procedure: 2D Echo, Color Doppler and Cardiac Doppler Indications:    Stroke  History:        Patient has prior history of Echocardiogram examinations, most                 recent 03/30/2021. CHF, Carotid Disease, TIA and Stroke,                 Arrythmias:Atrial Fibrillation; Risk Factors:Hypertension,  Former Smoker and Dyslipidemia. 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  Sonographer:    Eartha Inch Referring Phys: [id number] Rikki Spearing  Sonographer Comments: Image acquisition challenging due to patient body habitus and Image acquisition challenging due to respiratory motion. IMPRESSIONS  1. Left ventricular ejection fraction, by estimation, is 60 to 65%. The left ventricle has normal function. The left ventricle has no regional wall motion abnormalities. Left ventricular diastolic parameters are consistent with Grade II diastolic dysfunction  (pseudonormalization). Elevated left ventricular end-diastolic pressure.  2. Right ventricular systolic function is normal. The right ventricular size is normal.  3. The mitral valve is normal in structure. No evidence of mitral valve regurgitation. No evidence of mitral stenosis.  4. The aortic valve is normal in structure. Aortic valve regurgitation is not visualized. No aortic stenosis is present.  5. The inferior vena cava is normal in size with greater than 50% respiratory variability, suggesting right atrial pressure of 3 mmHg. FINDINGS  Left Ventricle: Left ventricular ejection fraction, by estimation, is 60 to 65%. The left ventricle has normal function. The left ventricle has no regional wall motion abnormalities. The left ventricular internal cavity size was normal in size. There is  no left ventricular hypertrophy. Left ventricular diastolic parameters are consistent with Grade II diastolic dysfunction (pseudonormalization). Elevated left ventricular end-diastolic pressure. Right Ventricle: The right ventricular size is normal. No increase in right ventricular wall thickness. Right ventricular systolic function is normal. Left Atrium: Left atrial size was normal in size. Right Atrium: Right atrial size was normal in size. Pericardium: There is no evidence of pericardial effusion. Mitral Valve: The mitral valve is normal in structure. No evidence of mitral valve regurgitation. No evidence of mitral valve stenosis. Tricuspid Valve: The tricuspid valve is normal in structure. Tricuspid valve regurgitation is not demonstrated. No evidence of tricuspid stenosis. Aortic Valve: The aortic valve is normal in structure. Aortic valve regurgitation is not visualized. No aortic stenosis is present. Pulmonic Valve: The pulmonic valve was normal in structure. Pulmonic valve regurgitation is not visualized. No evidence of pulmonic stenosis. Aorta: The aortic root and ascending aorta are structurally normal, with no  evidence of dilitation. Venous: The inferior vena cava is normal in size with greater than 50% respiratory variability, suggesting right atrial pressure of 3 mmHg. IAS/Shunts: No atrial level shunt detected by color flow Doppler.  LEFT VENTRICLE PLAX 2D LVIDd:         3.90 cm     Diastology LVIDs:         2.90 cm     LV e' medial:    5.87 cm/s LV PW:         1.00 cm     LV E/e' medial:  15.8 LV IVS:        1.00 cm     LV e' lateral:   7.72 cm/s LVOT diam:     2.00 cm     LV E/e' lateral: 12.0 LV SV:         88 LV SV Index:   46 LVOT Area:     3.14 cm  LV Volumes (MOD) LV vol d, MOD A2C: 58.7 ml LV vol d, MOD A4C: 72.7 ml LV vol s, MOD A2C: 17.1 ml LV vol s, MOD A4C:  26.9 ml LV SV MOD A2C:     41.6 ml LV SV MOD A4C:     72.7 ml LV SV MOD BP:      45.4 ml RIGHT VENTRICLE RV S prime:     11.10 cm/s TAPSE (M-mode): 1.6 cm LEFT ATRIUM             Index        RIGHT ATRIUM           Index LA diam:        3.60 cm 1.89 cm/m   RA Area:     11.60 cm LA Vol (A2C):   27.2 ml 14.28 ml/m  RA Volume:   19.90 ml  10.45 ml/m LA Vol (A4C):   35.6 ml 18.69 ml/m LA Biplane Vol: 32.0 ml 16.80 ml/m  AORTIC VALVE              PULMONIC VALVE LVOT Vmax:   157.00 cm/s  PR End Diast Vel: 3.79 msec LVOT Vmean:  100.000 cm/s LVOT VTI:    0.280 m  AORTA Ao Root diam: 3.10 cm Ao Asc diam:  3.50 cm MITRAL VALVE MV Area (PHT): 3.77 cm    SHUNTS MV Decel Time: 201 msec    Systemic VTI:  0.28 m MV E velocity: 92.90 cm/s  Systemic Diam: 2.00 cm MV A velocity: 81.80 cm/s MV E/A ratio:  1.14 Skeet Latch MD Electronically signed by Skeet Latch MD Signature Date/Time: 03/07/2022/1:38:17 PM    Final    MR BRAIN WO CONTRAST  Result Date: 03/07/2022 CLINICAL DATA:  Stroke follow-up EXAM: MRI HEAD WITHOUT CONTRAST TECHNIQUE: Multiplanar, multiecho pulse sequences of the brain and surrounding structures were obtained without intravenous contrast. COMPARISON:  Head CT and CTA from yesterday FINDINGS: Brain: Moderate to large area of  restricted diffusion primarily affecting posterior frontal and parietal cortex on the right. Patchy remote bilateral cerebral infarcts, most extensive on the left in the corona radiata and on the right at the superior and posterior temporal cortex. Ischemic gliosis in the deep cerebral white matter. Generalized cerebral volume loss. No hydrocephalus or masslike finding Vascular: Grossly preserved flow voids Skull and upper cervical spine: No gross marrow lesion Sinuses/Orbits: No acute finding Other: Pervasive and significant motion artifact, pathology could easily be obscured. IMPRESSION: 1. Acute right MCA branch infarct affecting fronto-parietal cortex. 2. Multiple chronic infarcts. 3. Significant motion degradation. Electronically Signed   By: Jorje Guild M.D.   On: 03/07/2022 06:03   CT CEREBRAL PERFUSION W CONTRAST  Result Date: 03/06/2022 EXAM: CT PERFUSION BRAIN TECHNIQUE: Multiphase CT imaging of the brain was performed following IV bolus contrast injection. Subsequent parametric perfusion maps were calculated using RAPID software. RADIATION DOSE REDUCTION: This exam was performed according to the departmental dose-optimization program which includes automated exposure control, adjustment of the mA and/or kV according to patient size and/or use of iterative reconstruction technique. CONTRAST:  28m OMNIPAQUE IOHEXOL 350 MG/ML SOLN, 448mOMNIPAQUE IOHEXOL 350 MG/ML SOLN COMPARISON:  Same day CTA and CT head. FINDINGS: CT Brain Perfusion Findings: CBF (<30%) Volume: 34m17merfusion (Tmax>6.0s) volume: 1m28msmatch Volume: 1mL64mECTS on noncontrast CT Head: 10 today. Infarct Core: 0 mL Infarction Location:None identified IMPRESSION: 1. Approximally 23 mL of penumbra in the right MCA territory, correlating with occluded vessel seen on CTA. 2. No evidence of core infarct. Findings discussed with Dr. StackQuinn Axetelephone at 3:56 p.m. Electronically Signed   By: FredeMargaretha Sheffield   On: 03/06/2022  15:58  CT ANGIO HEAD NECK W WO CM (CODE STROKE)  Result Date: 03/06/2022 CLINICAL DATA:  Neuro deficit, acute, stroke suspected L sided weakness R gaze preference EXAM: CT ANGIOGRAPHY HEAD AND NECK TECHNIQUE: Multidetector CT imaging of the head and neck was performed using the standard protocol during bolus administration of intravenous contrast. Multiplanar CT image reconstructions and MIPs were obtained to evaluate the vascular anatomy. Carotid stenosis measurements (when applicable) are obtained utilizing NASCET criteria, using the distal internal carotid diameter as the denominator. RADIATION DOSE REDUCTION: This exam was performed according to the departmental dose-optimization program which includes automated exposure control, adjustment of the mA and/or kV according to patient size and/or use of iterative reconstruction technique. COMPARISON:  CTA Head March 29, 2021. FINDINGS: CTA NECK FINDINGS Aortic arch: Great vessel origins are patent. Right carotid system: Ulcerated atherosclerosis at the carotid bifurcation without greater than 50% stenosis. Left carotid system: Left ICA is occluded at its origin. Non opacified more distal ICA in the neck. Vertebral arteries: Chronically occluded right vertebral artery. Skeleton: No acute findings on limited assessment. Other neck: No acute findings on limited assessment. Upper chest: Visualized lung apices are clear when accounting for expiratory changes. Review of the MIP images confirms the above findings CTA HEAD FINDINGS Anterior circulation: Chronically occluded left ICA with reconstitution at the carotid terminus. Right ICA and right M1 MCA is patent. Occluded right mid M2 MCA branch in the region of a probable calcific embolus (series 10, image 82), which appears new from prior. Bilateral ACAs are patent. Posterior circulation: Chronically clear right vertebral artery. Left vertebral artery, basilar artery and bilateral posterior cerebral arteries are  patent without proximal high-grade stenosis. Left fetal type PCA. Venous sinuses: As permitted by contrast timing, patent. Review of the MIP images confirms the above findings IMPRESSION: 1. Occluded right mid M2 MCA (series 10, image 82) likely due to calcified embolus. 2. Chronically occluded right vertebral artery and left ICA. FIndings discussed with Dr. Quinn Axe at [facility] PM. Electronically Signed   By: Margaretha Sheffield M.D.   On: 03/06/2022 15:31   CT HEAD CODE STROKE WO CONTRAST  Result Date: 03/06/2022 CLINICAL DATA:  Code stroke.  Neuro deficit, acute, stroke suspected EXAM: CT HEAD WITHOUT CONTRAST TECHNIQUE: Contiguous axial images were obtained from the base of the skull through the vertex without intravenous contrast. RADIATION DOSE REDUCTION: This exam was performed according to the departmental dose-optimization program which includes automated exposure control, adjustment of the mA and/or kV according to patient size and/or use of iterative reconstruction technique. COMPARISON:  CT head March 29, 2021. FINDINGS: Brain: Similar remote infarcts in the left anterior temporal lobe, left MCA territory and right temporal lobe. No evidence of acute large vascular territory infarct, acute hemorrhage, mass lesion, midline shift or hydrocephalus. Vascular: No hyperdense vessel identified. Calcific atherosclerosis. Skull: No acute fracture. Sinuses/Orbits: Clear sinuses.  No acute orbital findings. Other: No mastoid effusions. ASPECTS [facility] Stroke Program Early CT Score) total score (0-10 with 10 being normal): 10. IMPRESSION: 1. No evidence of acute intracranial abnormality.  ASPECTS 10. 2. Similar remote infarcts, as above. Code stroke imaging results were communicated on 03/06/2022 at 3:19 pm to provider [facility] via secure text paging. Electronically Signed   By: Margaretha Sheffield M.D.   On: 03/06/2022 15:19     PHYSICAL EXAM  Temp:  [98.1 F (36.7 C)-99 F (37.2 C)] 98.8 F (37.1 C) (02/18  0747) Pulse Rate:  [63-91] 63 (02/18 0747) Resp:  [12-20] 18 (02/18 0747) BP: (  103-155)/(60-78) 125/75 (02/18 0747) SpO2:  [95 %-100 %] 100 % (02/18 0747)  General - Well nourished, well developed, in no apparent distress.  Ophthalmologic - fundi not visualized due to noncooperation.  Cardiovascular - Regular rhythm and rate.  Neuro - awake, alert, eyes open, severe hearing loss, difficulty with exam and questions.  However, orientated to age, place, time and people, but with severe dysarthria due to previous surgery. No aphasia, paucity of speech, following most simple commands. Able to name and repeat. No gaze palsy, tracking bilaterally, blinking to visual threat bilaterally, PERRL. No facial droop. Tongue midline. Bilateral UEs 5/5, no drift. Bilaterally LEs 5/5, no drift. Sensation symmetrical bilaterally, b/l FTN intact, gait not tested.     ASSESSMENT/PLAN Mr. TERIN BARSE is a 77 y.o. 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 No tPA given due to no clear symptoms.    Stroke:  bilateral small embolic shower, embolic pattern, secondary to unclear source CT no acute abnormality MRI bilateral small embolic shower CT head and neck right M2 occlusion now resolved Pan CT no malignancy 2D Echo EF 60 to 65% LE venous Doppler no DVT Will do loop recorder tomorrow before discharge LDL 43 HgbA1c 5.7 in 02/2022 Lovenox for VTE prophylaxis clopidogrel 75 mg daily and librexia trial meds prior to admission, now on ASA 81 and brilinta 90 bid DAPT for one month and then plavix alone. Will discontinue Librexia trial meds. I let pt cousin aware.  Ongoing aggressive stroke risk factor management Therapy recommendations:  none Disposition: Pending  History of stroke 11/2014 admitted for  angiogram for left ICA stenosis.  After procedure patient developed aphasia.  MRI showed right MCA and tiny left occipital infarcts.  Stroke likely related to procedure.  Angiogram showed left ICA/CCA 60 to 70% stenosis.  CTA neck showed left ICA 75 to 80% stenosis.  Carotid Doppler left ICA 60 to 79% stenosis.  EF 55 to 60%.  LDL 44.  Patient discharged on DAPT and Lipitor 80. 03/2021 patient admitted for left CRAO with partial improvement.  CTA head and neck showed left ICA occluded.  Right VA occlusion.  Bilateral P2 stenosis.  MRI showed no acute infarct.  EF 60 to 65%.  LDL 47, A1c 5.8.  Again discharged on DAPT and Lipitor 80. 02/2022 admitted for left-sided weakness and slurred speech.  Found to have right M2 occlusion due to calcified embolus.  MRI showed right MCA infarct.  LDL 36, A1c 5.7, enrolled in [place] stroke trial.  Also discharged on DAPT and Lipitor 80.  Carotid stenosis/occlusion CT head and neck chronic left ICA occlusion and right ICA atherosclerosis No intervention needed given chronic occlusion  Hypertension Stable Long term BP goal normotensive  Hyperlipidemia Home meds: Lipitor 80 LDL 43, goal < 70 Now on Lipitor 80 Continue statin at discharge  Other Stroke Risk Factors Advanced Age >/= 32  Former Cigarette smoker Obesity, Body mass index is 30.47 kg/m., BMI >/= 30 associated with increased stroke risk, recommend weight loss, diet and exercise as appropriate  Coronary artery disease CHF PVD   Other Active Problems Laryngeal/tongue cancer status post chemotherapy, radiation and partial glossectomy RLS Pulmonary fibrosis with chronic hypoxia  Hospital day # 1  I had long discussion with cousin and pt at bedside, updated pt current condition, treatment plan and potential prognosis, and  answered all the questions. They expressed understanding and appreciation.    Rosalin Hawking, MD PhD Stroke Neurology 04/03/2022 1:23 PM    To contact Stroke Continuity  provider, please refer to [email or web address] After hours, contact General Neurology

## 2022-04-03 NOTE — Plan of Care (Signed)
  Problem: Education: Goal: Understanding of CV disease, CV risk reduction, and recovery process will improve Outcome: Progressing Goal: Individualized Educational Video(s) Outcome: Progressing   Problem: Activity: Goal: Ability to return to baseline activity level will improve Outcome: Progressing   Problem: Cardiovascular: Goal: Ability to achieve and maintain adequate cardiovascular perfusion will improve Outcome: Progressing Goal: Vascular access site(s) Level 0-1 will be maintained Outcome: Progressing   Problem: Health Behavior/Discharge Planning: Goal: Ability to safely manage health-related needs after discharge will improve Outcome: Progressing   Problem: Education: Goal: Knowledge of disease or condition will improve Outcome: Progressing Goal: Knowledge of secondary prevention will improve (MUST DOCUMENT ALL) Outcome: Progressing Goal: Knowledge of patient specific risk factors will improve [place] N/A or DELETE if not current risk factor) Outcome: Progressing   Problem: Ischemic Stroke/TIA Tissue Perfusion: Goal: Complications of ischemic stroke/TIA will be minimized Outcome: Progressing   Problem: Coping: Goal: Will verbalize positive feelings about self Outcome: Progressing Goal: Will identify appropriate support needs Outcome: Progressing   Problem: Health Behavior/Discharge Planning: Goal: Ability to manage health-related needs will improve Outcome: Progressing Goal: Goals will be collaboratively established with patient/family Outcome: Progressing   Problem: Self-Care: Goal: Ability to participate in self-care as condition permits will improve Outcome: Progressing Goal: Verbalization of feelings and concerns over difficulty with self-care will improve Outcome: Progressing Goal: Ability to communicate needs accurately will improve Outcome: Progressing   Problem: Nutrition: Goal: Risk of aspiration will decrease Outcome: Progressing Goal: Dietary  intake will improve Outcome: Progressing   Problem: Education: Goal: Knowledge of General Education information will improve Description: Including pain rating scale, medication(s)/side effects and non-pharmacologic comfort measures Outcome: Progressing   Problem: Health Behavior/Discharge Planning: Goal: Ability to manage health-related needs will improve Outcome: Progressing   Problem: Clinical Measurements: Goal: Ability to maintain clinical measurements within normal limits will improve Outcome: Progressing Goal: Will remain free from infection Outcome: Progressing Goal: Diagnostic test results will improve Outcome: Progressing Goal: Respiratory complications will improve Outcome: Progressing Goal: Cardiovascular complication will be avoided Outcome: Progressing   Problem: Activity: Goal: Risk for activity intolerance will decrease Outcome: Progressing   Problem: Nutrition: Goal: Adequate nutrition will be maintained Outcome: Progressing   Problem: Coping: Goal: Level of anxiety will decrease Outcome: Progressing   Problem: Elimination: Goal: Will not experience complications related to bowel motility Outcome: Progressing Goal: Will not experience complications related to urinary retention Outcome: Progressing   Problem: Pain Managment: Goal: General experience of comfort will improve Outcome: Progressing   Problem: Safety: Goal: Ability to remain free from injury will improve Outcome: Progressing   Problem: Skin Integrity: Goal: Risk for impaired skin integrity will decrease Outcome: Progressing

## 2022-04-03 NOTE — Progress Notes (Signed)
[facility] PROGRESS NOTE   James Richards [id number] DOB: 1945-10-15 DOA: 04/01/2022  PCP: Pcp, No  Brief History/Interval Summary: 77 y.o. male with medical history significant of recurrent TIAs, CRAO in Feb 2023. R MCA stroke last month s/p embolectomy. 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 Pt with residual R sided deficits following the R MCA stroke.  Presented with speech difficulty and confusion.  Symptoms had improved while he was in the emergency department.  Subsequently hospitalized for further management.  Consultants: Neurology  Procedures: Lower extremity venous Doppler has been ordered    Subjective/Interval History: Patient mentioned that he slept well overnight.  Denies any complaints this morning.  Able to ambulate to the bathroom and back.     Assessment/Plan:  Acute stroke MRI raises concern for embolic stroke.  Patient underwent CT angiogram head and neck. Neurology is consulted. LDL is 43.  HbA1c is 5.7 when checked recently.  Patient is currently on aspirin and Plavix.  Patient is on statin as well. Patient had an echocardiogram recently in January when he was hospitalized for stroke.  Normal EF was noted.  Grade 2 diastolic dysfunction was noted.  No need to repeat echocardiogram at this time. PT OT speech therapy evaluation. Permissive hypertension. Patient does have some residual left-sided deficits from his previous stroke in January. CT of the chest abdomen pelvis was ordered to look for occult malignancy.  No concerns identified.  He does have a large hiatal hernia but is asymptomatic currently. Venous Doppler study of the lower extremity has been ordered by neurology.  Loop recorder is being considered.  Paroxysmal supraventricular tachycardia Few episodes of tachycardia noted on telemetry.  Will start low-dose beta-blocker.  Hypothyroidism Continue with levothyroxine.  Restless leg syndrome Continue  with ropinirole.  Obesity Estimated body mass index is 30.74 kg/m as calculated from the following:   Height as of this encounter: 5' 5"$  (1.651 m).   Weight as of this encounter: 83.8 kg.   DVT Prophylaxis: Lovenox Code Status: Full code Family Communication: Discussed with patient.  No family at bedside Disposition Plan: Hopefully home when neurological workup has been completed.  Status is: Inpatient Remains inpatient appropriate because: Acute stroke     Medications: Scheduled:  aspirin EC  81 mg Oral Daily   atorvastatin  80 mg Oral Daily   clopidogrel  75 mg Oral Daily   enoxaparin (LOVENOX) injection  40 mg Subcutaneous Q24H   sodium chloride flush  3 mL Intravenous Once   Continuous: [id number] **OR** acetaminophen (TYLENOL) oral liquid 160 mg/5 mL **OR** acetaminophen  Antibiotics: Anti-infectives (From admission, onward)    None       Objective:  Vital Signs  Vitals:   04/02/22 2314 04/02/22 2315 04/03/22 0320 04/03/22 0747  BP: 103/60 (!) 145/78 128/78 125/75  Pulse: 81 77 75 63  Resp: 12 17 15 18  $ Temp: 99 F (37.2 C) 98.3 F (36.8 C) 98.1 F (36.7 C) 98.8 F (37.1 C)  TempSrc: Oral Oral Oral Oral  SpO2: 97% 97% 97% 100%  Weight:      Height:       No intake or output data in the 24 hours ending 04/03/22 1027  Filed Weights   04/01/22 1723 04/02/22 0142  Weight: 80.7 kg 83.8 kg    General appearance: Awake alert.  In no distress Resp: Clear to auscultation bilaterally.  Normal effort Cardio: S1-S2 is normal regular.  No S3-S4.  No rubs murmurs or  bruit GI: Abdomen is soft.  Nontender nondistended.  Bowel sounds are present normal.  No masses organomegaly Extremities: No edema.     Lab Results:  Data Reviewed: I have personally reviewed following labs and reports of the imaging studies  CBC: Recent Labs  Lab 04/01/22 1755 04/01/22 1758 04/03/22 0344  WBC 7.4  --  5.6  NEUTROABS 5.6  --   --   HGB 13.1 13.6 12.9*  HCT  39.5 40.0 38.1*  MCV 95.9  --  92.0  PLT 252  --  233     Basic Metabolic Panel: Recent Labs  Lab 04/01/22 1755 04/01/22 1758 04/03/22 0344  NA 142 142 138  K 4.3 4.3 4.0  CL 106 105 105  CO2 25  --  24  GLUCOSE 101* 97 90  BUN 21 25* 11  CREATININE 1.19 1.10 1.01  CALCIUM 9.4  --  9.0     GFR: Estimated Creatinine Clearance: 62.9 mL/min (by C-G formula based on SCr of 1.01 mg/dL).  Liver Function Tests: Recent Labs  Lab 04/01/22 1755  AST 31  ALT 31  ALKPHOS 78  BILITOT 0.4  PROT 6.4*  ALBUMIN 3.5      Coagulation Profile: Recent Labs  Lab 04/01/22 1755  INR 1.0      Lipid Profile: Recent Labs    04/02/22 0432  CHOL 98  HDL 46  LDLCALC 43  TRIG 45  CHOLHDL 2.1      Radiology Studies: CT CHEST ABDOMEN PELVIS W CONTRAST  Result Date: 04/02/2022 CLINICAL DATA:  Occult malignancy? EXAM: CT CHEST, ABDOMEN, AND PELVIS WITH CONTRAST TECHNIQUE: Multidetector CT imaging of the chest, abdomen and pelvis was performed following the standard protocol during bolus administration of intravenous contrast. RADIATION DOSE REDUCTION: This exam was performed according to the departmental dose-optimization program which includes automated exposure control, adjustment of the mA and/or kV according to patient size and/or use of iterative reconstruction technique. CONTRAST:  100 mL OMNIPAQUE IOHEXOL 350 MG/ML SOLN COMPARISON:  CT chest 05/14/2021 FINDINGS: CT CHEST FINDINGS Cardiovascular: No significant vascular findings. Normal heart size. No pericardial effusion. Atheromatous calcifications coronary arteries and aorta. Mediastinum/Nodes: No enlarged mediastinal, hilar, or axillary lymph nodes. Thyroid gland is not visualized. Other mediastinal structures including trachea and esophagus are grossly unremarkable. Large paraesophageal hiatal hernia. Lungs/Pleura: Dependent bibasilar subsegmental atelectasis. No pulmonary edema or pneumonia identified. Musculoskeletal: No  chest wall mass or suspicious bone lesions identified. CT ABDOMEN PELVIS FINDINGS Hepatobiliary: Subcentimeter hepatic cyst right lobe posteriorly at the dome and no biliary ductal dilatation. Unremarkable gallbladder which contains excreted contrast. Pancreas: Unremarkable. No pancreatic ductal dilatation or surrounding inflammatory changes. Spleen: Normal in size without focal abnormality. Adrenals/Urinary Tract: Adrenal glands are unremarkable. Right kidney 1.6 cm cyst. This does not need to be evaluated any further. No hydronephrosis. No nephrolithiasis. No ureteral stones. Urinary bladder contains excreted contrast. Stomach/Bowel: No bowel dilatation. No evidence for obstruction. Oral contrast progressed to the splenic flexure. Stomach is within normal limits. Appendix appears normal. No evidence of bowel wall thickening, distention, or inflammatory changes. Vascular/Lymphatic: Aortic atherosclerosis. No enlarged abdominal or pelvic lymph nodes. Reproductive: Prostate is unremarkable. Other: No abdominal wall hernia or abnormality. No abdominopelvic ascites. Musculoskeletal: No acute or significant osseous findings. IMPRESSION: 1. Large paraesophageal hiatal hernia. 2. Right kidney cyst. 3. Hepatic cysts. 4. No acute abdominal or pelvic pathology and no cardiopulmonary process. Electronically Signed   By: Sammie Bench M.D.   On: 04/02/2022 14:42   EEG adult  Result Date: 04/02/2022 Lora Havens, MD     04/02/2022  8:43 AM Patient Name: James Richards MRN: [id number] Epilepsy Attending: Lora Havens Referring Physician/Provider: Donnetta Simpers, MD Date: 04/02/2022 Duration: 27.40 mins Patient history: 77 y.o. male with PMH significant for receurrent strokes/TIA, CRAO, peripheral vascular disease, HTN, HLD, recent admission for R MCA stroke 2/2 calcified R MCA M2 embolus thought to be secondary to riht ICA therosclerosis and was discharged on Aspirin and plavix and enrolled in [place] trial who  presents with intermittent episodes of confusion/word finding difficulty x 3. EEG to evaluate for seizure Level of alertness: Awake AEDs during EEG study: None Technical aspects: This EEG study was done with scalp electrodes positioned according to the 10-20 International system of electrode placement. Electrical activity was reviewed with band pass filter of 1-70Hz$ , sensitivity of 7 uV/mm, display speed of 65m/sec with a 60Hz$  notched filter applied as appropriate. EEG data were recorded continuously and digitally stored.  Video monitoring was available and reviewed as appropriate. Description: The posterior dominant rhythm consists of 8 Hz activity of moderate voltage (25-35 uV) seen predominantly in posterior head regions, symmetric and reactive to eye opening and eye closing. EEG showed intermittent generalized 5 to 6 Hz theta slowing. Physiologic photic driving was not seen during photic stimulation.  Hyperventilation was not performed.   ABNORMALITY - Intermittent slow, generalized IMPRESSION: This study is suggestive of mild diffuse encephalopathy, nonspecific etiology. No seizures or epileptiform discharges were seen throughout the recording. Please note lack of epileptiform activity during interictal EEG does not exclude the diagnosis of epilepsy. PLora Havens  CT ANGIO HEAD NECK W WO CM  Result Date: 04/02/2022 CLINICAL DATA:  Sudden onset generalized weakness EXAM: CT ANGIOGRAPHY HEAD AND NECK TECHNIQUE: Multidetector CT imaging of the head and neck was performed using the standard protocol during bolus administration of intravenous contrast. Multiplanar CT image reconstructions and MIPs were obtained to evaluate the vascular anatomy. Carotid stenosis measurements (when applicable) are obtained utilizing NASCET criteria, using the distal internal carotid diameter as the denominator. RADIATION DOSE REDUCTION: This exam was performed according to the departmental dose-optimization program which  includes automated exposure control, adjustment of the mA and/or kV according to patient size and/or use of iterative reconstruction technique. CONTRAST:  729mOMNIPAQUE IOHEXOL 350 MG/ML SOLN COMPARISON:  03/06/2022 CTA head and neck, correlation is also made with CT head 04/01/2022 FINDINGS: CT HEAD FINDINGS For noncontrast findings, please see 04/01/2022 CT head. CTA NECK FINDINGS Aortic arch: 3 arch, with a common origin of the brachiocephalic and left common carotid arteries and aortic origin of the left vertebral artery. Imaged portion shows no evidence of aneurysm or dissection. No significant stenosis of the major arch vessel origins. Right carotid system: No evidence of dissection, occlusion, or hemodynamically significant stenosis (greater than 50%). Redemonstrated ulcerated plaque at the bifurcation and in the proximal right ICA. Left carotid system: Redemonstrated occlusion of the left ICA at its origin. The left CCA and ECA are patent. Vertebral arteries: Chronically occluded right vertebral artery. The left vertebral artery is patent, without significant stenosis, dissection, or occlusion. Skeleton: No acute osseous abnormality. Other neck: No acute finding. Upper chest: No focal pulmonary opacity or pleural effusion. Review of the MIP images confirms the above findings CTA HEAD FINDINGS Anterior circulation: Redemonstrated chronically occluded left ICA, with reconstitution at the terminus. The right ICA is patent to the terminus. A1 segments patent. Normal anterior communicating artery. Anterior cerebral arteries are patent to their  distal aspects. No M1 stenosis or occlusion. A previously noted occluded right MCA branch in the area of a likely calcific embolus (series 7, image 87), is now patent (series 7, image 84). To their MCA branches perfused distal aspects. Posterior circulation: Chronically occluded right vertebral artery. The left vertebral artery is patent to the vertebrobasilar junction.  The left PICA is patent proximally. Basilar patent to its distal aspect. Superior cerebellar arteries patent proximally. Fetal origin of the left PCA and near fetal origin of the right PCA. The PCAs are patent to their distal aspects without significant stenosis. Venous sinuses: Not well opacified. Anatomic variants: Fetal origin of the left PCA and near fetal origin of the right PCA. Review of the MIP images confirms the above findings IMPRESSION: 1. Redemonstrated chronically occluded left ICA, with reconstitution at the terminus, and chronically occluded right vertebral artery. 2. A previously noted occluded right MCA branch in the area of a likely calcific embolus is now patent. 3. No other intracranial large vessel occlusion or significant stenosis. 4. No additional hemodynamically significant stenosis in the neck. Redemonstrated ulcerated plaque at the right carotid bifurcation and in the proximal right ICA. Electronically Signed   By: Merilyn Baba M.D.   On: 04/02/2022 00:26   MR BRAIN WO CONTRAST  Result Date: 04/01/2022 CLINICAL DATA:  Transient episodes of confusion and aphasia, recent stroke EXAM: MRI HEAD WITHOUT CONTRAST TECHNIQUE: Multiplanar, multiecho pulse sequences of the brain and surrounding structures were obtained without intravenous contrast. COMPARISON:  03/07/2022 MRI head, correlation is also made with 04/01/2022 CT head FINDINGS: Evaluation is limited by motion brain: Scattered, primarily cortical foci of restricted diffusion with ADC correlates in the bilateral cerebral hemispheres (series 2, images 21-23, 26-29, 34-35, 37-41, 43-44, and 46), as well as in the right caudate head and left external capsule (series 2, image 31), which are new from the prior MRI and likely reflect represent acute to subacute infarcts. Some restricted diffusion with diminished ADC correlate remains in the area of prior infarct in the posterior right frontal and anterior right parietal cortex, consistent  with subacute, evolving infarct. No acute hemorrhage, mass, mass effect, or midline shift. No hydrocephalus or definite extra-axial collection. Redemonstrated remote infarcts in the left corona radiata, bilateral parietal cortex, and posterior right temporal cortex. Vascular: Limited evaluation secondary to motion. Skull and upper cervical spine: Limited evaluation secondary to motion. Sinuses/Orbits: No acute finding. Other: Fluid in bilateral mastoid air cells. IMPRESSION: 1. Evaluation is limited by motion. Within this limitation, there are scattered foci of restricted diffusion in the bilateral cerebral hemispheres, as well as in the right caudate head and left external capsule, which are new from the prior MRI and likely reflect acute to early subacute infarcts. Given multiple vascular territories, a central embolic etiology is likely. 2. Evolving subacute, evolving infarct in the posterior right frontal and anterior right parietal cortex. These results were called by telephone at the time of interpretation on 04/01/2022 at 11:00 pm to provider MATTHEW TRIFAN , who verbally acknowledged these results. Electronically Signed   By: Merilyn Baba M.D.   On: 04/01/2022 23:00   CT HEAD WO CONTRAST  Result Date: 04/01/2022 CLINICAL DATA:  Neuro deficit, acute, stroke suspected EXAM: CT HEAD WITHOUT CONTRAST TECHNIQUE: Contiguous axial images were obtained from the base of the skull through the vertex without intravenous contrast. RADIATION DOSE REDUCTION: This exam was performed according to the departmental dose-optimization program which includes automated exposure control, adjustment of the mA and/or kV according  to patient size and/or use of iterative reconstruction technique. COMPARISON:  CT head 03/06/2022, MRI head 03/29/2021 FINDINGS: Brain: Cerebral ventricle sizes are concordant with the degree of cerebral volume loss. Patchy and confluent areas of decreased attenuation are noted throughout the deep and  periventricular white matter of the cerebral hemispheres bilaterally, compatible with chronic microvascular ischemic disease. Left frontoparietal and anterior temporal lobe encephalomalacia. Right temporal lobe encephalomalacia. No evidence of large-territorial acute infarction. No parenchymal hemorrhage. No mass lesion. No extra-axial collection. No mass effect or midline shift. No hydrocephalus. Basilar cisterns are patent. Vascular: No hyperdense vessel. Atherosclerotic calcifications are present within the cavernous internal carotid arteries. Skull: No acute fracture or focal lesion. Sinuses/Orbits: Paranasal sinuses and mastoid air cells are clear. The orbits are unremarkable. Other: None. IMPRESSION: No acute intracranial abnormality in a patient with chronic bilateral cerebral infarction. Electronically Signed   By: Iven Finn M.D.   On: 04/01/2022 19:11       LOS: 1 day   [facility] Pager on [email or web address]  04/03/2022, 10:27 AM

## 2022-04-03 NOTE — Progress Notes (Signed)
STROKE TEAM PROGRESS NOTE   SUBJECTIVE (INTERVAL HISTORY) No family is at the bedside.  Overall his condition is rapidly improving.  Patient has severe hard of hearing, difficulty to perform exam and answer questions.  However moving all extremities, no aphasia, orientated x 3.   OBJECTIVE Temp:  [97.9 F (36.6 C)-99 F (37.2 C)] 98.3 F (36.8 C) (02/17 2315) Pulse Rate:  [69-91] 77 (02/17 2315) Cardiac Rhythm: Heart block (02/17 2000) Resp:  [12-20] 17 (02/17 2315) BP: (103-155)/(60-92) 145/78 (02/17 2315) SpO2:  [95 %-100 %] 97 % (02/17 2315) Weight:  [83.8 kg] 83.8 kg (02/17 0142)  No results for input(s): "GLUCAP" in the last 168 hours. Recent Labs  Lab 04/01/22 1755 04/01/22 1758  NA 142 142  K 4.3 4.3  CL 106 105  CO2 25  --   GLUCOSE 101* 97  BUN 21 25*  CREATININE 1.19 1.10  CALCIUM 9.4  --    Recent Labs  Lab 04/01/22 1755  AST 31  ALT 31  ALKPHOS 78  BILITOT 0.4  PROT 6.4*  ALBUMIN 3.5   Recent Labs  Lab 04/01/22 1755 04/01/22 1758  WBC 7.4  --   NEUTROABS 5.6  --   HGB 13.1 13.6  HCT 39.5 40.0  MCV 95.9  --   PLT 252  --    No results for input(s): "CKTOTAL", "CKMB", "CKMBINDEX", "TROPONINI" in the last 168 hours. Recent Labs    04/01/22 1755  LABPROT 13.4  INR 1.0   No results for input(s): "COLORURINE", "LABSPEC", "PHURINE", "GLUCOSEU", "HGBUR", "BILIRUBINUR", "KETONESUR", "PROTEINUR", "UROBILINOGEN", "NITRITE", "LEUKOCYTESUR" in the last 72 hours.  Invalid input(s): "APPERANCEUR"     Component Value Date/Time   CHOL 98 04/02/2022 0432   CHOL 102 12/27/2021 1153   TRIG 45 04/02/2022 0432   HDL 46 04/02/2022 0432   HDL 45 12/27/2021 1153   CHOLHDL 2.1 04/02/2022 0432   VLDL 9 04/02/2022 0432   LDLCALC 43 04/02/2022 0432   LDLCALC 42 12/27/2021 1153   Lab Results  Component Value Date   HGBA1C 5.7 (H) 03/06/2022      Component Value Date/Time   LABOPIA NONE DETECTED 03/06/2022 1910   COCAINSCRNUR NONE DETECTED 03/06/2022  1910   LABBENZ NONE DETECTED 03/06/2022 1910   AMPHETMU NONE DETECTED 03/06/2022 1910   THCU NONE DETECTED 03/06/2022 1910   LABBARB NONE DETECTED 03/06/2022 1910    Recent Labs  Lab 04/02/22 0432  ETH <10    I have personally reviewed the radiological images below and agree with the radiology interpretations.  CT CHEST ABDOMEN PELVIS W CONTRAST  Result Date: 04/02/2022 CLINICAL DATA:  Occult malignancy? EXAM: CT CHEST, ABDOMEN, AND PELVIS WITH CONTRAST TECHNIQUE: Multidetector CT imaging of the chest, abdomen and pelvis was performed following the standard protocol during bolus administration of intravenous contrast. RADIATION DOSE REDUCTION: This exam was performed according to the departmental dose-optimization program which includes automated exposure control, adjustment of the mA and/or kV according to patient size and/or use of iterative reconstruction technique. CONTRAST:  100 mL OMNIPAQUE IOHEXOL 350 MG/ML SOLN COMPARISON:  CT chest 05/14/2021 FINDINGS: CT CHEST FINDINGS Cardiovascular: No significant vascular findings. Normal heart size. No pericardial effusion. Atheromatous calcifications coronary arteries and aorta. Mediastinum/Nodes: No enlarged mediastinal, hilar, or axillary lymph nodes. Thyroid gland is not visualized. Other mediastinal structures including trachea and esophagus are grossly unremarkable. Large paraesophageal hiatal hernia. Lungs/Pleura: Dependent bibasilar subsegmental atelectasis. No pulmonary edema or pneumonia identified. Musculoskeletal: No chest wall mass or suspicious bone lesions  identified. CT ABDOMEN PELVIS FINDINGS Hepatobiliary: Subcentimeter hepatic cyst right lobe posteriorly at the dome and no biliary ductal dilatation. Unremarkable gallbladder which contains excreted contrast. Pancreas: Unremarkable. No pancreatic ductal dilatation or surrounding inflammatory changes. Spleen: Normal in size without focal abnormality. Adrenals/Urinary Tract: Adrenal  glands are unremarkable. Right kidney 1.6 cm cyst. This does not need to be evaluated any further. No hydronephrosis. No nephrolithiasis. No ureteral stones. Urinary bladder contains excreted contrast. Stomach/Bowel: No bowel dilatation. No evidence for obstruction. Oral contrast progressed to the splenic flexure. Stomach is within normal limits. Appendix appears normal. No evidence of bowel wall thickening, distention, or inflammatory changes. Vascular/Lymphatic: Aortic atherosclerosis. No enlarged abdominal or pelvic lymph nodes. Reproductive: Prostate is unremarkable. Other: No abdominal wall hernia or abnormality. No abdominopelvic ascites. Musculoskeletal: No acute or significant osseous findings. IMPRESSION: 1. Large paraesophageal hiatal hernia. 2. Right kidney cyst. 3. Hepatic cysts. 4. No acute abdominal or pelvic pathology and no cardiopulmonary process. Electronically Signed   By: Sammie Bench M.D.   On: 04/02/2022 14:42   EEG adult  Result Date: 04/02/2022 Lora Havens, MD     04/02/2022  8:43 AM Patient Name: James Richards MRN: [id number] Epilepsy Attending: Lora Havens Referring Physician/Provider: Donnetta Simpers, MD Date: 04/02/2022 Duration: 27.40 mins Patient history: 77 y.o. male with PMH significant for receurrent strokes/TIA, CRAO, peripheral vascular disease, HTN, HLD, recent admission for R MCA stroke 2/2 calcified R MCA M2 embolus thought to be secondary to riht ICA therosclerosis and was discharged on Aspirin and plavix and enrolled in [place] trial who presents with intermittent episodes of confusion/word finding difficulty x 3. EEG to evaluate for seizure Level of alertness: Awake AEDs during EEG study: None Technical aspects: This EEG study was done with scalp electrodes positioned according to the 10-20 International system of electrode placement. Electrical activity was reviewed with band pass filter of 1-70Hz$ , sensitivity of 7 uV/mm, display speed of 80m/sec with a  60Hz$  notched filter applied as appropriate. EEG data were recorded continuously and digitally stored.  Video monitoring was available and reviewed as appropriate. Description: The posterior dominant rhythm consists of 8 Hz activity of moderate voltage (25-35 uV) seen predominantly in posterior head regions, symmetric and reactive to eye opening and eye closing. EEG showed intermittent generalized 5 to 6 Hz theta slowing. Physiologic photic driving was not seen during photic stimulation.  Hyperventilation was not performed.   ABNORMALITY - Intermittent slow, generalized IMPRESSION: This study is suggestive of mild diffuse encephalopathy, nonspecific etiology. No seizures or epileptiform discharges were seen throughout the recording. Please note lack of epileptiform activity during interictal EEG does not exclude the diagnosis of epilepsy. PLora Havens  CT ANGIO HEAD NECK W WO CM  Result Date: 04/02/2022 CLINICAL DATA:  Sudden onset generalized weakness EXAM: CT ANGIOGRAPHY HEAD AND NECK TECHNIQUE: Multidetector CT imaging of the head and neck was performed using the standard protocol during bolus administration of intravenous contrast. Multiplanar CT image reconstructions and MIPs were obtained to evaluate the vascular anatomy. Carotid stenosis measurements (when applicable) are obtained utilizing NASCET criteria, using the distal internal carotid diameter as the denominator. RADIATION DOSE REDUCTION: This exam was performed according to the departmental dose-optimization program which includes automated exposure control, adjustment of the mA and/or kV according to patient size and/or use of iterative reconstruction technique. CONTRAST:  724mOMNIPAQUE IOHEXOL 350 MG/ML SOLN COMPARISON:  03/06/2022 CTA head and neck, correlation is also made with CT head 04/01/2022 FINDINGS: CT HEAD FINDINGS For  noncontrast findings, please see 04/01/2022 CT head. CTA NECK FINDINGS Aortic arch: 3 arch, with a common origin  of the brachiocephalic and left common carotid arteries and aortic origin of the left vertebral artery. Imaged portion shows no evidence of aneurysm or dissection. No significant stenosis of the major arch vessel origins. Right carotid system: No evidence of dissection, occlusion, or hemodynamically significant stenosis (greater than 50%). Redemonstrated ulcerated plaque at the bifurcation and in the proximal right ICA. Left carotid system: Redemonstrated occlusion of the left ICA at its origin. The left CCA and ECA are patent. Vertebral arteries: Chronically occluded right vertebral artery. The left vertebral artery is patent, without significant stenosis, dissection, or occlusion. Skeleton: No acute osseous abnormality. Other neck: No acute finding. Upper chest: No focal pulmonary opacity or pleural effusion. Review of the MIP images confirms the above findings CTA HEAD FINDINGS Anterior circulation: Redemonstrated chronically occluded left ICA, with reconstitution at the terminus. The right ICA is patent to the terminus. A1 segments patent. Normal anterior communicating artery. Anterior cerebral arteries are patent to their distal aspects. No M1 stenosis or occlusion. A previously noted occluded right MCA branch in the area of a likely calcific embolus (series 7, image 87), is now patent (series 7, image 84). To their MCA branches perfused distal aspects. Posterior circulation: Chronically occluded right vertebral artery. The left vertebral artery is patent to the vertebrobasilar junction. The left PICA is patent proximally. Basilar patent to its distal aspect. Superior cerebellar arteries patent proximally. Fetal origin of the left PCA and near fetal origin of the right PCA. The PCAs are patent to their distal aspects without significant stenosis. Venous sinuses: Not well opacified. Anatomic variants: Fetal origin of the left PCA and near fetal origin of the right PCA. Review of the MIP images confirms the above  findings IMPRESSION: 1. Redemonstrated chronically occluded left ICA, with reconstitution at the terminus, and chronically occluded right vertebral artery. 2. A previously noted occluded right MCA branch in the area of a likely calcific embolus is now patent. 3. No other intracranial large vessel occlusion or significant stenosis. 4. No additional hemodynamically significant stenosis in the neck. Redemonstrated ulcerated plaque at the right carotid bifurcation and in the proximal right ICA. Electronically Signed   By: Merilyn Baba M.D.   On: 04/02/2022 00:26   MR BRAIN WO CONTRAST  Result Date: 04/01/2022 CLINICAL DATA:  Transient episodes of confusion and aphasia, recent stroke EXAM: MRI HEAD WITHOUT CONTRAST TECHNIQUE: Multiplanar, multiecho pulse sequences of the brain and surrounding structures were obtained without intravenous contrast. COMPARISON:  03/07/2022 MRI head, correlation is also made with 04/01/2022 CT head FINDINGS: Evaluation is limited by motion brain: Scattered, primarily cortical foci of restricted diffusion with ADC correlates in the bilateral cerebral hemispheres (series 2, images 21-23, 26-29, 34-35, 37-41, 43-44, and 46), as well as in the right caudate head and left external capsule (series 2, image 31), which are new from the prior MRI and likely reflect represent acute to subacute infarcts. Some restricted diffusion with diminished ADC correlate remains in the area of prior infarct in the posterior right frontal and anterior right parietal cortex, consistent with subacute, evolving infarct. No acute hemorrhage, mass, mass effect, or midline shift. No hydrocephalus or definite extra-axial collection. Redemonstrated remote infarcts in the left corona radiata, bilateral parietal cortex, and posterior right temporal cortex. Vascular: Limited evaluation secondary to motion. Skull and upper cervical spine: Limited evaluation secondary to motion. Sinuses/Orbits: No acute finding. Other:  Fluid in bilateral  mastoid air cells. IMPRESSION: 1. Evaluation is limited by motion. Within this limitation, there are scattered foci of restricted diffusion in the bilateral cerebral hemispheres, as well as in the right caudate head and left external capsule, which are new from the prior MRI and likely reflect acute to early subacute infarcts. Given multiple vascular territories, a central embolic etiology is likely. 2. Evolving subacute, evolving infarct in the posterior right frontal and anterior right parietal cortex. These results were called by telephone at the time of interpretation on 04/01/2022 at 11:00 pm to provider MATTHEW TRIFAN , who verbally acknowledged these results. Electronically Signed   By: Merilyn Baba M.D.   On: 04/01/2022 23:00   CT HEAD WO CONTRAST  Result Date: 04/01/2022 CLINICAL DATA:  Neuro deficit, acute, stroke suspected EXAM: CT HEAD WITHOUT CONTRAST TECHNIQUE: Contiguous axial images were obtained from the base of the skull through the vertex without intravenous contrast. RADIATION DOSE REDUCTION: This exam was performed according to the departmental dose-optimization program which includes automated exposure control, adjustment of the mA and/or kV according to patient size and/or use of iterative reconstruction technique. COMPARISON:  CT head 03/06/2022, MRI head 03/29/2021 FINDINGS: Brain: Cerebral ventricle sizes are concordant with the degree of cerebral volume loss. Patchy and confluent areas of decreased attenuation are noted throughout the deep and periventricular white matter of the cerebral hemispheres bilaterally, compatible with chronic microvascular ischemic disease. Left frontoparietal and anterior temporal lobe encephalomalacia. Right temporal lobe encephalomalacia. No evidence of large-territorial acute infarction. No parenchymal hemorrhage. No mass lesion. No extra-axial collection. No mass effect or midline shift. No hydrocephalus. Basilar cisterns are patent.  Vascular: No hyperdense vessel. Atherosclerotic calcifications are present within the cavernous internal carotid arteries. Skull: No acute fracture or focal lesion. Sinuses/Orbits: Paranasal sinuses and mastoid air cells are clear. The orbits are unremarkable. Other: None. IMPRESSION: No acute intracranial abnormality in a patient with chronic bilateral cerebral infarction. Electronically Signed   By: Iven Finn M.D.   On: 04/01/2022 19:11   IR CT Head Ltd  Result Date: 03/09/2022 INDICATION: New onset of right gaze deviation and occluded inferior division of the right middle cerebral artery in the M3 region. EXAM: 1. EMERGENT LARGE VESSEL OCCLUSION THROMBOLYSIS (anterior CIRCULATION) COMPARISON:  CT angiogram of the head and neck 03/06/2022. MEDICATIONS: Ancef 2 g IV antibiotic was administered within 1 hour of the procedure. ANESTHESIA/SEDATION: General anesthesia. CONTRAST:  Omnipaque 300 25 cc. FLUOROSCOPY TIME:  Fluoroscopy Time: 5 minutes 6 seconds (724 mGy). COMPLICATIONS: None immediate. TECHNIQUE: Following a full explanation of the procedure along with the potential associated complications, an informed witnessed consent was obtained. The risks of intracranial hemorrhage of 10%, worsening neurological deficit, ventilator dependency, death and inability to revascularize were all reviewed in detail with the patient's sister. The patient was then put under general anesthesia by the Department of Anesthesiology at [facility]. The right groin was prepped and draped in the usual sterile fashion. Thereafter using modified Seldinger technique, transfemoral access into the right common femoral artery was obtained without difficulty. Over a 0.035 inch guidewire an 8 French 25 cm Pinnacle sheath was inserted. Through this, and also over a 0.035 inch guidewire a 5 [place] JB 1 catheter was advanced to the aortic arch region and selectively positioned in the right common carotid artery. FINDINGS: The  right common carotid arteriogram demonstrates the right external carotid artery to be mild narrowed at its origin. Its branches opacify normally. The right internal carotid artery at the bulb demonstrates  a focal outpouching from its anterior wall. No intraluminal filling defects are evident. There is moderate tortuosity of the proximal 1/3 of the right internal carotid artery. More distally, the vessel is seen to opacify to the cranial skull base. The petrous, the cavernous and the supraclinoid right ICA demonstrate wide patency. The right anterior cerebral artery opacifies into the capillary and venous phases. Prompt opacification of the left anterior cerebral artery and the left middle cerebral artery distribution is noted via the anterior communicating artery from the right. The right middle cerebral artery demonstrates inferior division to be widely patent. The superior division demonstrates a focal area of caliber narrowing of the mid perisylvian branch in the M3 segment with partial distal reconstitution. Caliber of the M3 region of the superior division was too small for aspiration thrombectomy, or for stent retriever procedures given the high-risk of perforation associated with the calcified embolus. The balloon guide was removed. An 8 French Angio-Seal closure device was applied for hemostasis at the right groin puncture site. Distal pulses remained Dopplerable in both feet unchanged from prior to the procedure. Patient was extubated. Upon recovery, the patient remained agitated in simple commands. Minimal movement was seen in the left upper extremity. Patient moved the left lower and the right upper and lower extremities spontaneously. He was then returned to the neuro ICU for further management. PROCEDURE: As above. IMPRESSION: Angiographic occlusion of a right middle cerebral perisylvian superior division branch in the M3 region. PLAN: As per referring MD. Electronically Signed   By: Luanne Bras  M.D.   On: 03/09/2022 08:42   IR PERCUTANEOUS ART THROMBECTOMY/INFUSION INTRACRANIAL INC DIAG ANGIO  Result Date: 03/09/2022 INDICATION: New onset of right gaze deviation and occluded inferior division of the right middle cerebral artery in the M3 region. EXAM: 1. EMERGENT LARGE VESSEL OCCLUSION THROMBOLYSIS (anterior CIRCULATION) COMPARISON:  CT angiogram of the head and neck 03/06/2022. MEDICATIONS: Ancef 2 g IV antibiotic was administered within 1 hour of the procedure. ANESTHESIA/SEDATION: General anesthesia. CONTRAST:  Omnipaque 300 25 cc. FLUOROSCOPY TIME:  Fluoroscopy Time: 5 minutes 6 seconds (724 mGy). COMPLICATIONS: None immediate. TECHNIQUE: Following a full explanation of the procedure along with the potential associated complications, an informed witnessed consent was obtained. The risks of intracranial hemorrhage of 10%, worsening neurological deficit, ventilator dependency, death and inability to revascularize were all reviewed in detail with the patient's sister. The patient was then put under general anesthesia by the Department of Anesthesiology at [facility]. The right groin was prepped and draped in the usual sterile fashion. Thereafter using modified Seldinger technique, transfemoral access into the right common femoral artery was obtained without difficulty. Over a 0.035 inch guidewire an 8 French 25 cm Pinnacle sheath was inserted. Through this, and also over a 0.035 inch guidewire a 5 [place] JB 1 catheter was advanced to the aortic arch region and selectively positioned in the right common carotid artery. FINDINGS: The right common carotid arteriogram demonstrates the right external carotid artery to be mild narrowed at its origin. Its branches opacify normally. The right internal carotid artery at the bulb demonstrates a focal outpouching from its anterior wall. No intraluminal filling defects are evident. There is moderate tortuosity of the proximal 1/3 of the right internal  carotid artery. More distally, the vessel is seen to opacify to the cranial skull base. The petrous, the cavernous and the supraclinoid right ICA demonstrate wide patency. The right anterior cerebral artery opacifies into the capillary and venous phases. Prompt opacification of  the left anterior cerebral artery and the left middle cerebral artery distribution is noted via the anterior communicating artery from the right. The right middle cerebral artery demonstrates inferior division to be widely patent. The superior division demonstrates a focal area of caliber narrowing of the mid perisylvian branch in the M3 segment with partial distal reconstitution. Caliber of the M3 region of the superior division was too small for aspiration thrombectomy, or for stent retriever procedures given the high-risk of perforation associated with the calcified embolus. The balloon guide was removed. An 8 French Angio-Seal closure device was applied for hemostasis at the right groin puncture site. Distal pulses remained Dopplerable in both feet unchanged from prior to the procedure. Patient was extubated. Upon recovery, the patient remained agitated in simple commands. Minimal movement was seen in the left upper extremity. Patient moved the left lower and the right upper and lower extremities spontaneously. He was then returned to the neuro ICU for further management. PROCEDURE: As above. IMPRESSION: Angiographic occlusion of a right middle cerebral perisylvian superior division branch in the M3 region. PLAN: As per referring MD. Electronically Signed   By: Luanne Bras M.D.   On: 03/09/2022 08:42   ECHOCARDIOGRAM COMPLETE  Result Date: 03/07/2022    ECHOCARDIOGRAM REPORT   Patient Name:   James Richards Date of Exam: 03/07/2022 Medical Rec #:  [id number]     Height:       65.0 in Accession #:    [id number]    Weight:       183.0 lb Date of Birth:  08/21/1945    BSA:          1.905 m Patient Age:    2 years      BP:            116/85 mmHg Patient Gender: M             HR:           83 bpm. Exam Location:  Inpatient Procedure: 2D Echo, Color Doppler and Cardiac Doppler Indications:    Stroke  History:        Patient has prior history of Echocardiogram examinations, most                 recent 03/30/2021. CHF, Carotid Disease, TIA and Stroke,                 Arrythmias:Atrial Fibrillation; Risk Factors:Hypertension,                 Former Smoker and Dyslipidemia. 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  Sonographer:    Eartha Inch Referring Phys: [id number] Rikki Spearing  Sonographer Comments: Image acquisition challenging due to patient body habitus and Image acquisition challenging due to respiratory motion. IMPRESSIONS  1. Left ventricular ejection fraction, by estimation, is 60 to 65%. The left ventricle has normal function. The left ventricle has no regional wall motion abnormalities. Left ventricular diastolic parameters are consistent with Grade II diastolic dysfunction (pseudonormalization). Elevated left ventricular end-diastolic pressure.  2. Right ventricular systolic function is normal. The right ventricular size is normal.  3. The mitral valve is normal in structure. No evidence of mitral valve regurgitation. No evidence of mitral stenosis.  4. The aortic valve is normal  in structure. Aortic valve regurgitation is not visualized. No aortic stenosis is present.  5. The inferior vena cava is normal in size with greater than 50% respiratory variability, suggesting right atrial pressure of 3 mmHg. FINDINGS  Left Ventricle: Left ventricular ejection fraction, by estimation, is 60 to 65%. The left ventricle has normal function. The left ventricle has no regional wall motion abnormalities. The left ventricular internal cavity size was normal in size. There is  no left ventricular hypertrophy. Left ventricular diastolic parameters are consistent  with Grade II diastolic dysfunction (pseudonormalization). Elevated left ventricular end-diastolic pressure. Right Ventricle: The right ventricular size is normal. No increase in right ventricular wall thickness. Right ventricular systolic function is normal. Left Atrium: Left atrial size was normal in size. Right Atrium: Right atrial size was normal in size. Pericardium: There is no evidence of pericardial effusion. Mitral Valve: The mitral valve is normal in structure. No evidence of mitral valve regurgitation. No evidence of mitral valve stenosis. Tricuspid Valve: The tricuspid valve is normal in structure. Tricuspid valve regurgitation is not demonstrated. No evidence of tricuspid stenosis. Aortic Valve: The aortic valve is normal in structure. Aortic valve regurgitation is not visualized. No aortic stenosis is present. Pulmonic Valve: The pulmonic valve was normal in structure. Pulmonic valve regurgitation is not visualized. No evidence of pulmonic stenosis. Aorta: The aortic root and ascending aorta are structurally normal, with no evidence of dilitation. Venous: The inferior vena cava is normal in size with greater than 50% respiratory variability, suggesting right atrial pressure of 3 mmHg. IAS/Shunts: No atrial level shunt detected by color flow Doppler.  LEFT VENTRICLE PLAX 2D LVIDd:         3.90 cm     Diastology LVIDs:         2.90 cm     LV e' medial:    5.87 cm/s LV PW:         1.00 cm     LV E/e' medial:  15.8 LV IVS:        1.00 cm     LV e' lateral:   7.72 cm/s LVOT diam:     2.00 cm     LV E/e' lateral: 12.0 LV SV:         88 LV SV Index:   46 LVOT Area:     3.14 cm  LV Volumes (MOD) LV vol d, MOD A2C: 58.7 ml LV vol d, MOD A4C: 72.7 ml LV vol s, MOD A2C: 17.1 ml LV vol s, MOD A4C: 26.9 ml LV SV MOD A2C:     41.6 ml LV SV MOD A4C:     72.7 ml LV SV MOD BP:      45.4 ml RIGHT VENTRICLE RV S prime:     11.10 cm/s TAPSE (M-mode): 1.6 cm LEFT ATRIUM             Index        RIGHT ATRIUM            Index LA diam:        3.60 cm 1.89 cm/m   RA Area:     11.60 cm LA Vol (A2C):   27.2 ml 14.28 ml/m  RA Volume:   19.90 ml  10.45 ml/m LA Vol (A4C):   35.6 ml 18.69 ml/m LA Biplane Vol: 32.0 ml 16.80 ml/m  AORTIC VALVE              PULMONIC VALVE LVOT Vmax:   157.00 cm/s  PR End Diast Vel: 3.79 msec LVOT Vmean:  100.000 cm/s LVOT VTI:    0.280 m  AORTA Ao Root diam: 3.10 cm Ao Asc diam:  3.50 cm MITRAL VALVE MV Area (PHT): 3.77 cm    SHUNTS MV Decel Time: 201 msec    Systemic VTI:  0.28 m MV E velocity: 92.90 cm/s  Systemic Diam: 2.00 cm MV A velocity: 81.80 cm/s MV E/A ratio:  1.14 Skeet Latch MD Electronically signed by Skeet Latch MD Signature Date/Time: 03/07/2022/1:38:17 PM    Final    MR BRAIN WO CONTRAST  Result Date: 03/07/2022 CLINICAL DATA:  Stroke follow-up EXAM: MRI HEAD WITHOUT CONTRAST TECHNIQUE: Multiplanar, multiecho pulse sequences of the brain and surrounding structures were obtained without intravenous contrast. COMPARISON:  Head CT and CTA from yesterday FINDINGS: Brain: Moderate to large area of restricted diffusion primarily affecting posterior frontal and parietal cortex on the right. Patchy remote bilateral cerebral infarcts, most extensive on the left in the corona radiata and on the right at the superior and posterior temporal cortex. Ischemic gliosis in the deep cerebral white matter. Generalized cerebral volume loss. No hydrocephalus or masslike finding Vascular: Grossly preserved flow voids Skull and upper cervical spine: No gross marrow lesion Sinuses/Orbits: No acute finding Other: Pervasive and significant motion artifact, pathology could easily be obscured. IMPRESSION: 1. Acute right MCA branch infarct affecting fronto-parietal cortex. 2. Multiple chronic infarcts. 3. Significant motion degradation. Electronically Signed   By: Jorje Guild M.D.   On: 03/07/2022 06:03   CT CEREBRAL PERFUSION W CONTRAST  Result Date: 03/06/2022 EXAM: CT PERFUSION BRAIN  TECHNIQUE: Multiphase CT imaging of the brain was performed following IV bolus contrast injection. Subsequent parametric perfusion maps were calculated using RAPID software. RADIATION DOSE REDUCTION: This exam was performed according to the departmental dose-optimization program which includes automated exposure control, adjustment of the mA and/or kV according to patient size and/or use of iterative reconstruction technique. CONTRAST:  67m OMNIPAQUE IOHEXOL 350 MG/ML SOLN, 481mOMNIPAQUE IOHEXOL 350 MG/ML SOLN COMPARISON:  Same day CTA and CT head. FINDINGS: CT Brain Perfusion Findings: CBF (<30%) Volume: 68m28merfusion (Tmax>6.0s) volume: 71m41msmatch Volume: 71mL72mECTS on noncontrast CT Head: 10 today. Infarct Core: 0 mL Infarction Location:None identified IMPRESSION: 1. Approximally 23 mL of penumbra in the right MCA territory, correlating with occluded vessel seen on CTA. 2. No evidence of core infarct. Findings discussed with Dr. StackQuinn Axetelephone at 3:56 p.m. Electronically Signed   By: FredeMargaretha Sheffield   On: 03/06/2022 15:58   CT ANGIO HEAD NECK W WO CM (CODE STROKE)  Result Date: 03/06/2022 CLINICAL DATA:  Neuro deficit, acute, stroke suspected L sided weakness R gaze preference EXAM: CT ANGIOGRAPHY HEAD AND NECK TECHNIQUE: Multidetector CT imaging of the head and neck was performed using the standard protocol during bolus administration of intravenous contrast. Multiplanar CT image reconstructions and MIPs were obtained to evaluate the vascular anatomy. Carotid stenosis measurements (when applicable) are obtained utilizing NASCET criteria, using the distal internal carotid diameter as the denominator. RADIATION DOSE REDUCTION: This exam was performed according to the departmental dose-optimization program which includes automated exposure control, adjustment of the mA and/or kV according to patient size and/or use of iterative reconstruction technique. COMPARISON:  CTA Head March 29, 2021. FINDINGS: CTA NECK FINDINGS Aortic arch: Great vessel origins are patent. Right carotid system: Ulcerated atherosclerosis at the carotid bifurcation without greater than 50% stenosis. Left carotid system: Left ICA is occluded at its origin. Non opacified  more distal ICA in the neck. Vertebral arteries: Chronically occluded right vertebral artery. Skeleton: No acute findings on limited assessment. Other neck: No acute findings on limited assessment. Upper chest: Visualized lung apices are clear when accounting for expiratory changes. Review of the MIP images confirms the above findings CTA HEAD FINDINGS Anterior circulation: Chronically occluded left ICA with reconstitution at the carotid terminus. Right ICA and right M1 MCA is patent. Occluded right mid M2 MCA branch in the region of a probable calcific embolus (series 10, image 82), which appears new from prior. Bilateral ACAs are patent. Posterior circulation: Chronically clear right vertebral artery. Left vertebral artery, basilar artery and bilateral posterior cerebral arteries are patent without proximal high-grade stenosis. Left fetal type PCA. Venous sinuses: As permitted by contrast timing, patent. Review of the MIP images confirms the above findings IMPRESSION: 1. Occluded right mid M2 MCA (series 10, image 82) likely due to calcified embolus. 2. Chronically occluded right vertebral artery and left ICA. FIndings discussed with Dr. Quinn Axe at [facility] PM. Electronically Signed   By: Margaretha Sheffield M.D.   On: 03/06/2022 15:31   CT HEAD CODE STROKE WO CONTRAST  Result Date: 03/06/2022 CLINICAL DATA:  Code stroke.  Neuro deficit, acute, stroke suspected EXAM: CT HEAD WITHOUT CONTRAST TECHNIQUE: Contiguous axial images were obtained from the base of the skull through the vertex without intravenous contrast. RADIATION DOSE REDUCTION: This exam was performed according to the departmental dose-optimization program which includes automated exposure control,  adjustment of the mA and/or kV according to patient size and/or use of iterative reconstruction technique. COMPARISON:  CT head March 29, 2021. FINDINGS: Brain: Similar remote infarcts in the left anterior temporal lobe, left MCA territory and right temporal lobe. No evidence of acute large vascular territory infarct, acute hemorrhage, mass lesion, midline shift or hydrocephalus. Vascular: No hyperdense vessel identified. Calcific atherosclerosis. Skull: No acute fracture. Sinuses/Orbits: Clear sinuses.  No acute orbital findings. Other: No mastoid effusions. ASPECTS [facility] Stroke Program Early CT Score) total score (0-10 with 10 being normal): 10. IMPRESSION: 1. No evidence of acute intracranial abnormality.  ASPECTS 10. 2. Similar remote infarcts, as above. Code stroke imaging results were communicated on 03/06/2022 at 3:19 pm to provider [facility] via secure text paging. Electronically Signed   By: Margaretha Sheffield M.D.   On: 03/06/2022 15:19     PHYSICAL EXAM  Temp:  [97.9 F (36.6 C)-99 F (37.2 C)] 98.3 F (36.8 C) (02/17 2315) Pulse Rate:  [69-91] 77 (02/17 2315) Resp:  [12-20] 17 (02/17 2315) BP: (103-155)/(60-92) 145/78 (02/17 2315) SpO2:  [95 %-100 %] 97 % (02/17 2315) Weight:  [83.8 kg] 83.8 kg (02/17 0142)  General - Well nourished, well developed, in no apparent distress.  Ophthalmologic - fundi not visualized due to noncooperation.  Cardiovascular - Regular rhythm and rate.  Neuro - awake, alert, eyes open, severe hearing loss, difficulty with exam and questions.  However, orientated to age, place, time and people, but with severe dysarthria due to previous surgery. No aphasia, paucity of speech, following most simple commands. Able to name and repeat. No gaze palsy, tracking bilaterally, blinking to visual threat bilaterally, PERRL. No facial droop. Tongue midline. Bilateral UEs 5/5, no drift. Bilaterally LEs 5/5, no drift. Sensation symmetrical bilaterally, b/l FTN intact, gait  not tested.     ASSESSMENT/PLAN [name] is a 77 y.o. 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 No tPA given due to no clear symptoms.    Stroke:  bilateral small embolic shower, embolic pattern, secondary to unclear source CT no acute abnormality MRI bilateral small embolic shower CT head and neck right M2 occlusion now resolved Pan CT no malignancy 2D Echo EF 60 to 65% LE venous Doppler pending May consider loop recorder if work up unrevealing.  LDL 43 HgbA1c 5.7 in 02/2022 Lovenox for VTE prophylaxis clopidogrel 75 mg daily and librexia trial meds prior to admission, now on aspirin 81 mg daily and clopidogrel 75 mg daily DAPT. Will contact trial to decide on trial meds Ongoing aggressive stroke risk factor management Therapy recommendations:  none Disposition: Pending  History of stroke 11/2014 admitted for angiogram for left ICA stenosis.  After procedure patient developed aphasia.  MRI showed right MCA and tiny left occipital infarcts.  Stroke likely related to procedure.  Angiogram showed left ICA/CCA 60 to 70% stenosis.  CTA neck showed left ICA 75 to 80% stenosis.  Carotid Doppler left ICA 60 to 79% stenosis.  EF 55 to 60%.  LDL 44.  Patient discharged on DAPT and Lipitor 80. 03/2021 patient admitted for left CRAO with partial improvement.  CTA head and neck showed left ICA occluded.  Right VA occlusion.  Bilateral P2 stenosis.  MRI showed no acute infarct.  EF 60 to 65%.  LDL 47, A1c 5.8.  Again discharged on DAPT and Lipitor 80. 02/2022 admitted for left-sided weakness and slurred speech.  Found to have right M2 occlusion due to calcified embolus.  MRI showed right MCA infarct.  LDL 36, A1c 5.7, enrolled in [place] stroke trial.  Also discharged on DAPT and Lipitor  80.  Carotid stenosis/occlusion CT head and neck chronic left ICA occlusion and right ICA atherosclerosis No intervention needed given chronic occlusion  Hypertension Stable Long term BP goal normotensive  Hyperlipidemia Home meds: Lipitor 80 LDL 43, goal < 70 Now on Lipitor 80 Continue statin at discharge  Other Stroke Risk Factors Advanced Age >/= 39  Former Cigarette smoker Obesity, Body mass index is 30.47 kg/m., BMI >/= 30 associated with increased stroke risk, recommend weight loss, diet and exercise as appropriate  Coronary artery disease CHF PVD   Other Active Problems Laryngeal/tongue cancer status post chemotherapy, radiation and partial glossectomy RLS Pulmonary fibrosis with chronic hypoxia  Hospital day # 1  I spent additional 30 inpatient minutes face-to-face time with the patient, more than 50% of which was spent in counseling and coordination of care, reviewing test results, images and medication, and discussing the diagnosis, treatment plan and potential prognosis. This patient's care requiresreview of multiple databases, neurological assessment, discussion with family, other specialists and medical decision making of high complexity.    Rosalin Hawking, MD PhD Stroke Neurology 04/03/2022 12:11 AM    To contact Stroke Continuity provider, please refer to [email or web address] After hours, contact General Neurology

## 2022-04-04 ENCOUNTER — Other Ambulatory Visit (HOSPITAL_COMMUNITY): Payer: Self-pay

## 2022-04-04 ENCOUNTER — Encounter (HOSPITAL_COMMUNITY)
Admission: EM | Disposition: A | Discharge status: Home / Self Care | Payer: Self-pay | Source: Home / Self Care | Attending: Internal Medicine

## 2022-04-04 DIAGNOSIS — I639 Cerebral infarction, unspecified: Secondary | ICD-10-CM | POA: Diagnosis not present

## 2022-04-04 HISTORY — PX: LOOP RECORDER INSERTION: EP1214

## 2022-04-04 SURGERY — LOOP RECORDER INSERTION

## 2022-04-04 MED ORDER — TICAGRELOR 90 MG PO TABS
90.0000 mg | ORAL_TABLET | Freq: Two times a day (BID) | ORAL | 0 refills | Status: AC
  Filled 2022-04-04: qty 60, 30d supply, fill #0

## 2022-04-04 MED ORDER — ASPIRIN 81 MG PO TBEC
81.0000 mg | DELAYED_RELEASE_TABLET | Freq: Every day | ORAL | 0 refills | Status: DC
  Filled 2022-04-04: qty 30, 30d supply, fill #0

## 2022-04-04 MED ORDER — CLOPIDOGREL BISULFATE 75 MG PO TABS
75.0000 mg | ORAL_TABLET | Freq: Every day | ORAL | 2 refills | Status: DC
  Filled 2022-04-04: qty 30, 30d supply, fill #0

## 2022-04-04 MED ORDER — LIDOCAINE-EPINEPHRINE 1 %-1:100000 IJ SOLN
INTRAMUSCULAR | Status: AC
  Filled 2022-04-04: qty 1

## 2022-04-04 MED ORDER — METOPROLOL TARTRATE 25 MG PO TABS
12.5000 mg | ORAL_TABLET | Freq: Two times a day (BID) | ORAL | 2 refills | Status: DC
  Filled 2022-04-04: qty 30, 30d supply, fill #0

## 2022-04-04 MED ORDER — LIDOCAINE-EPINEPHRINE 1 %-1:100000 IJ SOLN
INTRAMUSCULAR | Status: DC | PRN
  Administered 2022-04-04: 20 mL

## 2022-04-04 SURGICAL SUPPLY — 2 items
MONITOR CARDIAC ASSERT IQ EL (Prosthesis & Implant Heart) IMPLANT
PACK LOOP INSERTION (CUSTOM PROCEDURE TRAY) ×1 IMPLANT

## 2022-04-04 NOTE — Progress Notes (Signed)
Occupational Therapy Treatment Patient Details Name: James Richards MRN: [id number] DOB: 30-May-1945 Today's Date: 04/04/2022   History of present illness Patient is a 77 yo male presenting to the ED with sudden onset of generalized weakness, confusion and slurred speech that lasted about 30 min per wife on 04/01/22. She states this is the 3rd TIA this week. He had a recent ischemic stroke 2 weeks ago with embolectomy. MRI on 04/01/22 showing: scattered foci of restricted diffusion in the bilateral cerebral hemispheres, as well as in the right caudate head and left external  Capsule and Evolving subacute, evolving infarct in the posterior right frontal and anterior right parietal cortex. 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   OT comments  Patient agreeable to OT session.  Completing ADLs with supervision, min cueing to locate soap on R side of sink.  Utilizing high contrast cueing for mobility in hallway, pt navigating, recalling cue of when to turn around, and locate his room with supervision. Cousin present and reports cognition is at baseline.  At home he has 24/7 support, typically doesn't complete iADLs but will make coffee or heat a meal up once in a while- pt able to verbalize steps to make coffee and has high contrast tape for microwave use.  Plans to dc home with support today.  Will follow acutely.    Recommendations for follow up therapy are one component of a multi-disciplinary discharge planning process, led by the attending physician.  Recommendations may be updated based on patient status, additional functional criteria and insurance authorization.    Follow Up Recommendations  No OT follow up     Assistance Recommended at Discharge Frequent or constant Supervision/Assistance  Patient can return home with the following  A  little help with walking and/or transfers;A little help with bathing/dressing/bathroom;Assistance with cooking/housework;Direct supervision/assist for medications management;Direct supervision/assist for financial management;Assist for transportation;Help with stairs or ramp for entrance   Equipment Recommendations  None recommended by OT    Recommendations for Other Services      Precautions / Restrictions Precautions Precautions: Fall Precaution Comments: low vision Restrictions Weight Bearing Restrictions: No       Mobility Bed Mobility Overal bed mobility: Modified Independent                  Transfers Overall transfer level: Needs assistance Equipment used: None Transfers: Sit to/from Stand Sit to Stand: Supervision                 Balance Overall balance assessment: Mild deficits observed, not formally tested                                         ADL either performed or assessed with clinical judgement   ADL Overall ADL's : Needs assistance/impaired     Grooming: Wash/dry hands;Supervision/safety;Standing               Lower Body Dressing: Supervision/safety;Sit to/from stand Lower Body Dressing Details (indicate cue type and reason): shoes and pants Toilet Transfer: Supervision/safety;Ambulation           Functional mobility during ADLs: Supervision/safety;Cueing for safety      Extremity/Trunk Assessment              Vision   Additional Comments: pt  with hx of low vision, functionally pt requires min cueing to locate soap on L side of sink, but avoiding obstacles and using high contrast able to locate items in hallway   Perception     Praxis      Cognition Arousal/Alertness: Awake/alert Behavior During Therapy: [facility] for tasks assessed/performed Overall Cognitive Status: History of cognitive impairments - at baseline                                 General Comments: cousin present and  reports pt cognition baseline        Exercises      Shoulder Instructions       General Comments cousin present and supportive.    Pertinent Vitals/ Pain       Pain Assessment Pain Assessment: No/denies pain  Home Living                                          Prior Functioning/Environment              Frequency  Min 2X/week        Progress Toward Goals  OT Goals(current goals can now be found in the care plan section)  Progress towards OT goals: Progressing toward goals  Acute Rehab OT Goals Patient Stated Goal: home OT Goal Formulation: With patient Time For Goal Achievement: 04/16/22 Potential to Achieve Goals: Good  Plan Discharge plan remains appropriate;Frequency remains appropriate    Co-evaluation                 AM-PAC OT "6 Clicks" Daily Activity     Outcome Measure   Help from another person eating meals?: A Little Help from another person taking care of personal grooming?: A Little Help from another person toileting, which includes using toliet, bedpan, or urinal?: A Little Help from another person bathing (including washing, rinsing, drying)?: A Little Help from another person to put on and taking off regular upper body clothing?: A Little Help from another person to put on and taking off regular lower body clothing?: A Little 6 Click Score: 18    End of Session    OT Visit Diagnosis: Unsteadiness on feet (R26.81);Other abnormalities of gait and mobility (R26.89);Muscle weakness (generalized) (M62.81);Low vision, both eyes (H54.2);Other symptoms and signs involving cognitive function   Activity Tolerance Patient tolerated treatment well   Patient Left in bed;with nursing/sitter in room;with call bell/phone within reach;with family/visitor present   Nurse Communication Mobility status        Time: [id number] OT Time Calculation (min): 11 min  Charges: OT General Charges $OT Visit: 1 Visit OT  Treatments $Self Care/Home Management : 8-22 mins  [place] Office [phone]   Delight Stare 04/04/2022, 12:15 PM

## 2022-04-04 NOTE — Discharge Instructions (Signed)
Post procedure wound care instructions Keep incision clean and dry for 3 days. You can remove outer dressing tomorrow. Leave steri-strips (little pieces of tape) on until seen in the office for wound check appointment. Call the office ([phone]) for redness, drainage, swelling, or fever.  

## 2022-04-04 NOTE — Consult Note (Signed)
ELECTROPHYSIOLOGY CONSULT NOTE  Patient ID: James Richards MRN: [id number], DOB/AGE: Sep 24, 1945   Admit date: 04/01/2022 Date of Consult: 04/04/2022  Primary Physician: Merryl Hacker, No Primary Cardiologist: Dr. Bettina Gavia (last 2021) Reason for Consultation: Cryptogenic stroke ; recommendations regarding Implantable Loop Recorder, requested by Dr. Erlinda Hong  History of Present Illness James Richards was admitted on 04/01/2022 with new speech difficulties and R eye visual changes.    PMHx includes: HTN, hypertensive heart disease/diastolic CHF, cirrhosis, PVD (known chronic occl left ICA and right VA), chronic retial artery occlusion, smoker, HLD, RLS, prior stroke, oral/tongue cancer (s/p glossectomy), baseline hard of hearing  Jan 2024,  acute R MCA stroke w/ left sided weakness, M2 occlusion due to calcified embolus. MRI showed right MCA infarct.   Neurology notes: bilateral small embolic shower, embolic pattern, secondary to unclear source .  he has undergone workup for stroke including echocardiogram and carotid dopplers.  The patient has been monitored on telemetry which has demonstrated sinus rhythm with no arrhythmias.   Neurology has deferred TEE.   Echocardiogram 03/07/22 (WITH HIS RECENT PRIOR STROKE) demonstrated    1. Left ventricular ejection fraction, by estimation, is 60 to 65%. The  left ventricle has normal function. The left ventricle has no regional  wall motion abnormalities. Left ventricular diastolic parameters are  consistent with Grade II diastolic  dysfunction (pseudonormalization). Elevated left ventricular end-diastolic  pressure.   2. Right ventricular systolic function is normal. The right ventricular  size is normal.   3. The mitral valve is normal in structure. No evidence of mitral valve  regurgitation. No evidence of mitral stenosis.   4. The aortic valve is normal in structure. Aortic valve regurgitation is  not visualized. No aortic stenosis is present.   5. The  inferior vena cava is normal in size with greater than 50%  respiratory variability, suggesting right atrial pressure of 3 mmHg.   Lab work is reviewed. SR, at times frequent PACs, he has had a couple episodes of fast rates, these appear to be PATs, brief PSVTs, not clearaly AFib, and are brief   Prior to admission, the patient denies chest pain, shortness of breath, dizziness, palpitations, or syncope.  They are recovering from their stroke with plans to home at discharge.      Past Medical History:  Diagnosis Date   Atherosclerosis of native arteries of the extremities with intermittent claudication 10/09/2013   Atherosclerotic peripheral vascular disease with intermittent claudication ([place]) 10/03/2012   BMI 33.0-33.9,adult 07/07/2019   Carotid stenosis [id number]   Diastolic dysfunction [id number]   Essential hypertension    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   History of recurrent TIAs 12/11/2014   Impacted cerumen 11/26/2019   Malignant neoplasm of base of tongue ([place]) 01/21/2015   Malignant neoplasm of larynx, unspecified ([facility])    Migraine 04/02/2014   Mixed hyperlipidemia    Other fatigue    Other specified hypothyroidism    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    Restless legs syndrome    Restrictive lung disease 11/26/2019   TIA (transient ischemic attack) 11/24/2014   Vision exam with abnormal findings concern for left retinal artery occlusion 03/29/2021     Surgical History:  Past Surgical History:  Procedure Laterality Date   IR CT HEAD LTD  03/06/2022   IR PERCUTANEOUS ART THROMBECTOMY/INFUSION INTRACRANIAL INC DIAG ANGIO  03/06/2022   LYMPHADENECTOMY     PERIPHERAL VASCULAR CATHETERIZATION N/A 11/24/2014   Procedure: Aortic Arch Angiography;  Surgeon: Angelia Mould, MD;  Location: [facility];  Service: Cardiovascular;  Laterality: N/A;   PERIPHERAL VASCULAR CATHETERIZATION N/A 11/24/2014   Procedure: Cerebral  Angiography;  Surgeon: Angelia Mould, MD;  Location: [facility];  Service: Cardiovascular;  Laterality: N/A;   RADIOLOGY WITH ANESTHESIA N/A 03/06/2022   Procedure: RADIOLOGY WITH ANESTHESIA;  Surgeon: Radiologist, Medication, MD;  Location: [facility];  Service: Radiology;  Laterality: N/A;   THROAT SURGERY     TONSILECTOMY/ADENOIDECTOMY WITH MYRINGOTOMY       Medications Prior to Admission  Medication Sig Dispense Refill Last Dose   acetaminophen-codeine (TYLENOL #3) 300-30 MG tablet TAKE 1 TABLET EVERY 4 HOURS AS NEEDED FOR MODERATE PAIN (Patient taking differently: Take 1 tablet by mouth as needed for moderate pain or severe pain (headache).) 42 tablet 0 Past Week   albuterol (VENTOLIN HFA) 108 (90 Base) MCG/ACT inhaler Inhale 1 puff into the lungs as needed for wheezing or shortness of breath.   04/01/2022   ascorbic acid (VITAMIN C) 500 MG tablet Take 500 mg by mouth daily.   04/01/2022   aspirin EC 81 MG tablet Take 81 mg by mouth daily.   04/01/2022   atorvastatin (LIPITOR) 80 MG tablet TAKE 1 TABLET EVERY DAY (Patient taking differently: Take 80 mg by mouth daily.) 90 tablet 2 04/01/2022   CINNAMON PO Take 1,500 mg by mouth daily.   04/01/2022   Coenzyme Q10 (CO Q 10) 100 MG CAPS Take 100 mg by mouth daily.   04/01/2022   furosemide (LASIX) 40 MG tablet Take 1 tablet (40 mg total) by mouth every other day. (Patient taking differently: Take 40 mg by mouth daily as needed for fluid or edema.) 90 tablet 2 [id number]   GARLIC PO Take [id number] mg by mouth daily.   04/01/2022   Ginkgo Biloba (GINKOBA PO) Take 120 mg by mouth daily.   04/01/2022   levothyroxine (SYNTHROID) 125 MCG tablet Take 1 tablet (125 mcg total) by mouth daily. 90 tablet 1 04/01/2022   losartan (COZAAR) 50 MG tablet Take 1 tablet (50 mg total) by mouth daily. 90 tablet 3 04/01/2022   minocycline (MINOCIN) 50 MG capsule Take 1 capsule (50 mg total) by mouth daily. 90 capsule 2 04/01/2022   Naphazoline HCl (CLEAR EYES OP) Place  1-2 drops into both eyes as needed (dry eye).   Past Week   Omega-3 Fatty Acids (FISH OIL) 1200 MG CPDR Take 1 capsule by mouth daily.   04/01/2022   pantoprazole (PROTONIX) 40 MG tablet Take 1 tablet (40 mg total) by mouth daily. 90 tablet 3 04/01/2022   Potassium Bicarbonate 99 MG CAPS Take 99 mg by mouth daily.   04/01/2022   rOPINIRole (REQUIP) 2 MG tablet Take 2 mg by mouth at bedtime.   Past Week   Study - LIBREXIA-STROKE - milvexian 25 mg or placebo tablet (PI-Sethi) Take 1 tablet by mouth 2 (two) times daily. For Investigational Use Only. Take with or without food at approximately the same time each day. (Patient taking differently: Take 25 mg by mouth 2 (two) times daily. For Investigational Use Only. Take with or without food at approximately the same time each day.) 70 tablet 0 04/01/2022   SYMBICORT 160-4.5 MCG/ACT inhaler Inhale 2 puffs into the lungs daily.   04/01/2022   vitamin B-12 (CYANOCOBALAMIN) 500 MCG tablet Take 500 mcg by mouth daily.   04/01/2022   zinc gluconate 50 MG tablet Take 50 mg by mouth daily.  04/01/2022   [DISCONTINUED] clopidogrel (PLAVIX) 75 MG tablet Take 1 tablet (75 mg total) by mouth daily. 90 tablet 2 04/01/2022    Inpatient Medications:   aspirin EC  81 mg Oral Daily   atorvastatin  80 mg Oral Daily   cyanocobalamin  500 mcg Oral Daily   enoxaparin (LOVENOX) injection  40 mg Subcutaneous Q24H   levothyroxine  125 mcg Oral Daily   metoprolol tartrate  12.5 mg Oral BID   pantoprazole  40 mg Oral Daily   rOPINIRole  2 mg Oral QHS   sodium chloride flush  3 mL Intravenous Once   ticagrelor  90 mg Oral BID    Allergies: No Known Allergies  Social History   Socioeconomic History   Marital status: Divorced    Spouse name: Not on file   Number of children: Not on file   Years of education: Not on file   Highest education level: Not on file  Occupational History   Not on file  Tobacco Use   Smoking status: Former    Packs/day: 1.00    Years: 41.00     Total pack years: 41.00    Types: Cigarettes    Quit date: 02/15/2007    Years since quitting: 15.1   Smokeless tobacco: Former    Types: Nurse, children's Use: Never used  Substance and Sexual Activity   Alcohol use: No    Alcohol/week: 0.0 standard drinks of alcohol   Drug use: No   Sexual activity: Not Currently    Partners: Female  Other Topics Concern   Not on file  Social History Narrative   Not on file   Social Determinants of Health   Financial Resource Strain: Low Risk  (08/26/2021)   Overall Financial Resource Strain (CARDIA)    Difficulty of Paying Living Expenses: Not hard at all  Food Insecurity: No Food Insecurity (03/09/2022)   Hunger Vital Sign    Worried About Running Out of Food in the Last Year: Never true    Ran Out of Food in the Last Year: Never true  Transportation Needs: No Transportation Needs (03/09/2022)   PRAPARE - Hydrologist (Medical): No    Lack of Transportation (Non-Medical): No  Physical Activity: Inactive (08/26/2021)   Exercise Vital Sign    Days of Exercise per Week: 0 days    Minutes of Exercise per Session: 0 min  Stress: No Stress Concern Present (08/26/2021)   [place]    Feeling of Stress : Not at all  Social Connections: Moderately Isolated (08/26/2021)   Social Connection and Isolation Panel [NHANES]    Frequency of Communication with Friends and Family: More than three times a week    Frequency of Social Gatherings with Friends and Family: More than three times a week    Attends Religious Services: More than 4 times per year    Active Member of [employer] or Organizations: No    Attends Archivist Meetings: Never    Marital Status: Divorced  Human resources officer Violence: Not At Risk (03/08/2022)   Humiliation, Afraid, Rape, and Kick questionnaire    Fear of Current or Ex-Partner: No    Emotionally Abused: No    Physically  Abused: No    Sexually Abused: No     Family History  Problem Relation Age of Onset   Other Father        amputation  Deep vein thrombosis Father    Heart disease Father        Atrial Fib.   Cancer Sister    Hypertension Brother       Review of Systems: All other systems reviewed and are otherwise negative except as noted above.  Physical Exam: Vitals:   04/03/22 1937 04/03/22 2352 04/04/22 0315 04/04/22 0750  BP: (!) 156/80 (!) 95/41 97/68 120/66  Pulse: (!) 56 71 (!) 57 (!) 54  Resp: 18 16 17 18  $ Temp: 98.5 F (36.9 C) 97.6 F (36.4 C) 97.7 F (36.5 C) 97.8 F (36.6 C)  TempSrc: Oral Oral Oral Oral  SpO2:  99% 96% 99%  Weight:      Height:        GEN- The patient is well appearing, alert and oriented x 3 today.   Head- normocephalic, atraumatic Eyes-  Sclera clear, conjunctiva pink Ears- hearing intact Oropharynx- clear Neck- supple Lungs- CTA b/l, normal work of breathing Heart- RRR, no murmurs, rubs or gallops  GI- soft, NT, ND Extremities- no clubbing, cyanosis, or edema MS- no significant deformity or atrophy Skin- no rash or lesion Psych- euthymic mood, full affect   Labs:   Lab Results  Component Value Date   WBC 5.6 04/03/2022   HGB 12.9 (L) 04/03/2022   HCT 38.1 (L) 04/03/2022   MCV 92.0 04/03/2022   PLT 233 04/03/2022    Recent Labs  Lab 04/01/22 1755 04/01/22 1758 04/03/22 0344  NA 142   < > 138  K 4.3   < > 4.0  CL 106   < > 105  CO2 25  --  24  BUN 21   < > 11  CREATININE 1.19   < > 1.01  CALCIUM 9.4  --  9.0  PROT 6.4*  --   --   BILITOT 0.4  --   --   ALKPHOS 78  --   --   ALT 31  --   --   AST 31  --   --   GLUCOSE 101*   < > 90   < > = values in this interval not displayed.   No results found for: "CKTOTAL", "CKMB", "CKMBINDEX", "TROPONINI" Lab Results  Component Value Date   CHOL 98 04/02/2022   CHOL 87 03/07/2022   CHOL 102 12/27/2021   Lab Results  Component Value Date   HDL 46 04/02/2022   HDL 36 (L)  03/07/2022   HDL 45 12/27/2021   Lab Results  Component Value Date   LDLCALC 43 04/02/2022   LDLCALC 36 03/07/2022   LDLCALC 42 12/27/2021   Lab Results  Component Value Date   TRIG 45 04/02/2022   TRIG 75 03/07/2022   TRIG 73 12/27/2021   Lab Results  Component Value Date   CHOLHDL 2.1 04/02/2022   CHOLHDL 2.4 03/07/2022   CHOLHDL 2.3 12/27/2021   No results found for: "LDLDIRECT"  Lab Results  Component Value Date   DDIMER 0.34 12/02/2019     Radiology/Studies:  VAS [place] LOWER EXTREMITY VENOUS (DVT) Result Date: 04/03/2022  Lower Venous DVT Study Patient Name:  James Richards  Date of Exam:   04/03/2022 Medical Rec #: [id number]      Accession #:    [id number] Date of Birth: Nov 02, 1945     Patient Gender: M Patient Age:   20 years Exam Location:  [facility] Procedure:      VAS [place] LOWER EXTREMITY VENOUS (DVT) Referring Phys:  JINDONG XU --------------------------------------------------------------------------------  Indications: Stroke.  Risk Factors: None identified. Limitations: Poor ultrasound/tissue interface. Comparison Study: No prior studies. Performing Technologist: Oliver Hum RVT  Examination Guidelines: A complete evaluation includes B-mode imaging, spectral Doppler, color Doppler, and power Doppler as needed of all accessible portions of each vessel. Bilateral testing is considered an integral part of a complete examination. Limited examinations for reoccurring indications may be performed as noted. The reflux portion of the exam is performed with the patient in reverse Trendelenburg.   Summary: RIGHT: - There is no evidence of deep vein thrombosis in the lower extremity.  - No cystic structure found in the popliteal fossa.  LEFT: - There is no evidence of deep vein thrombosis in the lower extremity.  - No cystic structure found in the popliteal fossa.  *See table(s) above for measurements and observations. Electronically signed by Deitra Mayo MD on  04/03/2022 at 3:39:57 PM.    Final    CT CHEST ABDOMEN PELVIS W CONTRAST Result Date: 04/02/2022 CLINICAL DATA:  Occult malignancy? EXAM: CT CHEST, ABDOMEN, AND PELVIS WITH CONTRAST TECHNIQUE: Multidetector CT imaging of the chest, abdomen and pelvis was performed following the standard protocol during bolus administration of intravenous contrast. RADIATION DOSE REDUCTION: This exam was performed according to the departmental dose-optimization program which includes automated exposure control, adjustment of the mA and/or kV according to patient size and/or use of iterative reconstruction technique. CONTRAST:  100 mL OMNIPAQUE IOHEXOL 350 MG/ML SOLN COMPARISON:  CT chest 05/14/2021 FINDINGS: CT CHEST FINDINGS Cardiovascular: No significant vascular findings. Normal heart size. No pericardial effusion. Atheromatous calcifications coronary arteries and aorta. Mediastinum/Nodes: No enlarged mediastinal, hilar, or axillary lymph nodes. Thyroid gland is not visualized. Other mediastinal structures including trachea and esophagus are grossly unremarkable. Large paraesophageal hiatal hernia. Lungs/Pleura: Dependent bibasilar subsegmental atelectasis. No pulmonary edema or pneumonia identified. Musculoskeletal: No chest wall mass or suspicious bone lesions identified. CT ABDOMEN PELVIS FINDINGS Hepatobiliary: Subcentimeter hepatic cyst right lobe posteriorly at the dome and no biliary ductal dilatation. Unremarkable gallbladder which contains excreted contrast. Pancreas: Unremarkable. No pancreatic ductal dilatation or surrounding inflammatory changes. Spleen: Normal in size without focal abnormality. Adrenals/Urinary Tract: Adrenal glands are unremarkable. Right kidney 1.6 cm cyst. This does not need to be evaluated any further. No hydronephrosis. No nephrolithiasis. No ureteral stones. Urinary bladder contains excreted contrast. Stomach/Bowel: No bowel dilatation. No evidence for obstruction. Oral contrast progressed to  the splenic flexure. Stomach is within normal limits. Appendix appears normal. No evidence of bowel wall thickening, distention, or inflammatory changes. Vascular/Lymphatic: Aortic atherosclerosis. No enlarged abdominal or pelvic lymph nodes. Reproductive: Prostate is unremarkable. Other: No abdominal wall hernia or abnormality. No abdominopelvic ascites. Musculoskeletal: No acute or significant osseous findings. IMPRESSION: 1. Large paraesophageal hiatal hernia. 2. Right kidney cyst. 3. Hepatic cysts. 4. No acute abdominal or pelvic pathology and no cardiopulmonary process. Electronically Signed   By: Sammie Bench M.D.   On: 04/02/2022 14:42   EEG adult Result Date: 04/02/2022 Lora Havens, MD     04/02/2022  8:43 AM Patient Name: CADENCE MCLAMB MRN: [id number] Epilepsy Attending: Lora Havens Referring Physician/Provider: Donnetta Simpers, MD Date: 04/02/2022 Duration: 27.40 mins Patient history: 77 y.o. male with PMH significant for receurrent strokes/TIA, CRAO, peripheral vascular disease, HTN, HLD, recent admission for R MCA stroke 2/2 calcified R MCA M2 embolus thought to be secondary to riht ICA therosclerosis and was discharged on Aspirin and plavix and enrolled in [place] trial who presents with intermittent episodes of confusion/word  finding difficulty x 3. EEG to evaluate for seizure Level of alertness: Awake AEDs during EEG study: None Technical aspects: This EEG study was done with scalp electrodes positioned according to the 10-20 International system of electrode placement. Electrical activity was reviewed with band pass filter of 1-70Hz$ , sensitivity of 7 uV/mm, display speed of 23m/sec with a 60Hz$  notched filter applied as appropriate. EEG data were recorded continuously and digitally stored.  Video monitoring was available and reviewed as appropriate. Description: The posterior dominant rhythm consists of 8 Hz activity of moderate voltage (25-35 uV) seen predominantly in posterior  head regions, symmetric and reactive to eye opening and eye closing. EEG showed intermittent generalized 5 to 6 Hz theta slowing. Physiologic photic driving was not seen during photic stimulation.  Hyperventilation was not performed.   ABNORMALITY - Intermittent slow, generalized IMPRESSION: This study is suggestive of mild diffuse encephalopathy, nonspecific etiology. No seizures or epileptiform discharges were seen throughout the recording. Please note lack of epileptiform activity during interictal EEG does not exclude the diagnosis of epilepsy. PLora Havens  CT ANGIO HEAD NECK W WO CM Result Date: 04/02/2022 CLINICAL DATA:  Sudden onset generalized weakness EXAM: CT ANGIOGRAPHY HEAD AND NECK TECHNIQUE: Multidetector CT imaging of the head and neck was performed using the standard protocol during bolus administration of intravenous contrast. Multiplanar CT image reconstructions and MIPs were obtained to evaluate the vascular anatomy. Carotid stenosis measurements (when applicable) are obtained utilizing NASCET criteria, using the distal internal carotid diameter as the denominator. RADIATION DOSE REDUCTION: This exam was performed according to the departmental dose-optimization program which includes automated exposure control, adjustment of the mA and/or kV according to patient size and/or use of iterative reconstruction technique. CONTRAST:  754mOMNIPAQUE IOHEXOL 350 MG/ML SOLN COMPARISON:  03/06/2022 CTA head and neck, correlation is also made with CT head 04/01/2022 FINDINGS: CT HEAD FINDINGS For noncontrast findings, please see 04/01/2022 CT head. CTA NECK FINDINGS Aortic arch: 3 arch, with a common origin of the brachiocephalic and left common carotid arteries and aortic origin of the left vertebral artery. Imaged portion shows no evidence of aneurysm or dissection. No significant stenosis of the major arch vessel origins. Right carotid system: No evidence of dissection, occlusion, or  hemodynamically significant stenosis (greater than 50%). Redemonstrated ulcerated plaque at the bifurcation and in the proximal right ICA. Left carotid system: Redemonstrated occlusion of the left ICA at its origin. The left CCA and ECA are patent. Vertebral arteries: Chronically occluded right vertebral artery. The left vertebral artery is patent, without significant stenosis, dissection, or occlusion. Skeleton: No acute osseous abnormality. Other neck: No acute finding. Upper chest: No focal pulmonary opacity or pleural effusion. Review of the MIP images confirms the above findings CTA HEAD FINDINGS Anterior circulation: Redemonstrated chronically occluded left ICA, with reconstitution at the terminus. The right ICA is patent to the terminus. A1 segments patent. Normal anterior communicating artery. Anterior cerebral arteries are patent to their distal aspects. No M1 stenosis or occlusion. A previously noted occluded right MCA branch in the area of a likely calcific embolus (series 7, image 87), is now patent (series 7, image 84). To their MCA branches perfused distal aspects. Posterior circulation: Chronically occluded right vertebral artery. The left vertebral artery is patent to the vertebrobasilar junction. The left PICA is patent proximally. Basilar patent to its distal aspect. Superior cerebellar arteries patent proximally. Fetal origin of the left PCA and near fetal origin of the right PCA. The PCAs are patent to their  distal aspects without significant stenosis. Venous sinuses: Not well opacified. Anatomic variants: Fetal origin of the left PCA and near fetal origin of the right PCA. Review of the MIP images confirms the above findings IMPRESSION: 1. Redemonstrated chronically occluded left ICA, with reconstitution at the terminus, and chronically occluded right vertebral artery. 2. A previously noted occluded right MCA branch in the area of a likely calcific embolus is now patent. 3. No other intracranial  large vessel occlusion or significant stenosis. 4. No additional hemodynamically significant stenosis in the neck. Redemonstrated ulcerated plaque at the right carotid bifurcation and in the proximal right ICA. Electronically Signed   By: Merilyn Baba M.D.   On: 04/02/2022 00:26   MR BRAIN WO CONTRAST Result Date: 04/01/2022 CLINICAL DATA:  Transient episodes of confusion and aphasia, recent stroke EXAM: MRI HEAD WITHOUT CONTRAST TECHNIQUE: Multiplanar, multiecho pulse sequences of the brain and surrounding structures were obtained without intravenous contrast. COMPARISON:  03/07/2022 MRI head, correlation is also made with 04/01/2022 CT head FINDINGS: Evaluation is limited by motion brain: Scattered, primarily cortical foci of restricted diffusion with ADC correlates in the bilateral cerebral hemispheres (series 2, images 21-23, 26-29, 34-35, 37-41, 43-44, and 46), as well as in the right caudate head and left external capsule (series 2, image 31), which are new from the prior MRI and likely reflect represent acute to subacute infarcts. Some restricted diffusion with diminished ADC correlate remains in the area of prior infarct in the posterior right frontal and anterior right parietal cortex, consistent with subacute, evolving infarct. No acute hemorrhage, mass, mass effect, or midline shift. No hydrocephalus or definite extra-axial collection. Redemonstrated remote infarcts in the left corona radiata, bilateral parietal cortex, and posterior right temporal cortex. Vascular: Limited evaluation secondary to motion. Skull and upper cervical spine: Limited evaluation secondary to motion. Sinuses/Orbits: No acute finding. Other: Fluid in bilateral mastoid air cells. IMPRESSION: 1. Evaluation is limited by motion. Within this limitation, there are scattered foci of restricted diffusion in the bilateral cerebral hemispheres, as well as in the right caudate head and left external capsule, which are new from the prior  MRI and likely reflect acute to early subacute infarcts. Given multiple vascular territories, a central embolic etiology is likely. 2. Evolving subacute, evolving infarct in the posterior right frontal and anterior right parietal cortex. These results were called by telephone at the time of interpretation on 04/01/2022 at 11:00 pm to provider MATTHEW TRIFAN , who verbally acknowledged these results. Electronically Signed   By: Merilyn Baba M.D.   On: 04/01/2022 23:00   CT HEAD WO CONTRAST Result Date: 04/01/2022 CLINICAL DATA:  Neuro deficit, acute, stroke suspected EXAM: CT HEAD WITHOUT CONTRAST TECHNIQUE: Contiguous axial images were obtained from the base of the skull through the vertex without intravenous contrast. RADIATION DOSE REDUCTION: This exam was performed according to the departmental dose-optimization program which includes automated exposure control, adjustment of the mA and/or kV according to patient size and/or use of iterative reconstruction technique. COMPARISON:  CT head 03/06/2022, MRI head 03/29/2021 FINDINGS: Brain: Cerebral ventricle sizes are concordant with the degree of cerebral volume loss. Patchy and confluent areas of decreased attenuation are noted throughout the deep and periventricular white matter of the cerebral hemispheres bilaterally, compatible with chronic microvascular ischemic disease. Left frontoparietal and anterior temporal lobe encephalomalacia. Right temporal lobe encephalomalacia. No evidence of large-territorial acute infarction. No parenchymal hemorrhage. No mass lesion. No extra-axial collection. No mass effect or midline shift. No hydrocephalus. Basilar cisterns are patent.  Vascular: No hyperdense vessel. Atherosclerotic calcifications are present within the cavernous internal carotid arteries. Skull: No acute fracture or focal lesion. Sinuses/Orbits: Paranasal sinuses and mastoid air cells are clear. The orbits are unremarkable. Other: None. IMPRESSION: No acute  intracranial abnormality in a patient with chronic bilateral cerebral infarction. Electronically Signed   By: Iven Finn M.D.   On: 04/01/2022 19:11    12-lead ECG SR All prior EKG's in EP[facility] reviewed with no documented atrial fibrillation  Telemetry SR, frequent PACs, a couple brief tachy episodes, l;ooks like PATs, no clearly Afib  Assessment and Plan:  1. Cryptogenic stroke The patient presents with cryptogenic stroke.   I spoke at length with the patient and his cousin [name], about monitoring for afib with either a 30 day event monitor or an implantable loop recorder.  Risks, benefits, and alteratives to implantable loop recorder were discussed with the patient today.   At this time, the patient is very clear in their decision to proceed with implantable loop recorder.   Wound care was reviewed with the patient (keep incision clean and dry for 3 days).  Wound check follow up will be scheduled for the patient.  Please call with questions.   Ibrahem Volkman Dyane Dustman, PA-C 04/04/2022

## 2022-04-04 NOTE — Progress Notes (Signed)
STROKE TEAM PROGRESS NOTE   SUBJECTIVE (INTERVAL HISTORY) James Richards is at the bedside. Pt had loop recorder today and being discharged. Neuro stable.    OBJECTIVE Temp:  [97.6 F (36.4 C)-98.5 F (36.9 C)] 97.9 F (36.6 C) (02/19 1518) Pulse Rate:  [54-71] 67 (02/19 1518) Cardiac Rhythm: Heart block (02/19 0800) Resp:  [16-18] 18 (02/19 1518) BP: (95-156)/(41-80) 114/62 (02/19 1518) SpO2:  [96 %-100 %] 99 % (02/19 1518)  No results for input(s): "GLUCAP" in the last 168 hours. Recent Labs  Lab 04/01/22 1755 04/01/22 1758 04/03/22 0344  NA 142 142 138  K 4.3 4.3 4.0  CL 106 105 105  CO2 25  --  24  GLUCOSE 101* 97 90  BUN 21 25* 11  CREATININE 1.19 1.10 1.01  CALCIUM 9.4  --  9.0   Recent Labs  Lab 04/01/22 1755  AST 31  ALT 31  ALKPHOS 78  BILITOT 0.4  PROT 6.4*  ALBUMIN 3.5   Recent Labs  Lab 04/01/22 1755 04/01/22 1758 04/03/22 0344  WBC 7.4  --  5.6  NEUTROABS 5.6  --   --   HGB 13.1 13.6 12.9*  HCT 39.5 40.0 38.1*  MCV 95.9  --  92.0  PLT 252  --  233   No results for input(s): "CKTOTAL", "CKMB", "CKMBINDEX", "TROPONINI" in the last 168 hours. Recent Labs    04/01/22 1755  LABPROT 13.4  INR 1.0   No results for input(s): "COLORURINE", "LABSPEC", "PHURINE", "GLUCOSEU", "HGBUR", "BILIRUBINUR", "KETONESUR", "PROTEINUR", "UROBILINOGEN", "NITRITE", "LEUKOCYTESUR" in the last 72 hours.  Invalid input(s): "APPERANCEUR"     Component Value Date/Time   CHOL 98 04/02/2022 0432   CHOL 102 12/27/2021 1153   TRIG 45 04/02/2022 0432   HDL 46 04/02/2022 0432   HDL 45 12/27/2021 1153   CHOLHDL 2.1 04/02/2022 0432   VLDL 9 04/02/2022 0432   LDLCALC 43 04/02/2022 0432   LDLCALC 42 12/27/2021 1153   Lab Results  Component Value Date   HGBA1C 5.7 (H) 03/06/2022      Component Value Date/Time   LABOPIA NONE DETECTED 03/06/2022 1910   COCAINSCRNUR NONE DETECTED 03/06/2022 1910   LABBENZ NONE DETECTED 03/06/2022 1910   AMPHETMU NONE DETECTED 03/06/2022  1910   THCU NONE DETECTED 03/06/2022 1910   LABBARB NONE DETECTED 03/06/2022 1910    Recent Labs  Lab 04/02/22 0432  ETH <10    I have personally reviewed the radiological images below and agree with the radiology interpretations.  EP PPM/ICD IMPLANT  Result Date: 04/04/2022 CONCLUSIONS:  1. Successful implantation of a implantable loop recorder for Cryptogenic stroke  2. No early apparent complications.   VAS [place] LOWER EXTREMITY VENOUS (DVT)  Result Date: 04/03/2022  Lower Venous DVT Study Patient Name:  James Richards [facility]  Date of Exam:   04/03/2022 Medical Rec #: [id number]      Accession #:    [id number] Date of Birth: 29-Sep-1945     Patient Gender: M Patient Age:   77 years Exam Location:  [facility] Procedure:      VAS [place] LOWER EXTREMITY VENOUS (DVT) Referring Phys: Cornelius Moras Samani Deal --------------------------------------------------------------------------------  Indications: Stroke.  Risk Factors: None identified. Limitations: Poor ultrasound/tissue interface. Comparison Study: No prior studies. Performing Technologist: Oliver Hum RVT  Examination Guidelines: A complete evaluation includes B-mode imaging, spectral Doppler, color Doppler, and power Doppler as needed of all accessible portions of each vessel. Bilateral testing is considered an integral part of a complete examination. Limited examinations  for reoccurring indications may be performed as noted. The reflux portion of the exam is performed with the patient in reverse Trendelenburg.  +---------+---------------+---------+-----------+----------+--------------+ RIGHT    CompressibilityPhasicitySpontaneityPropertiesThrombus Aging +---------+---------------+---------+-----------+----------+--------------+ CFV      Full           Yes      Yes                                 +---------+---------------+---------+-----------+----------+--------------+ SFJ      Full                                                         +---------+---------------+---------+-----------+----------+--------------+ FV Prox  Full                                                        +---------+---------------+---------+-----------+----------+--------------+ FV Mid   Full                                                        +---------+---------------+---------+-----------+----------+--------------+ FV DistalFull                                                        +---------+---------------+---------+-----------+----------+--------------+ PFV      Full                                                        +---------+---------------+---------+-----------+----------+--------------+ POP      Full           Yes      Yes                                 +---------+---------------+---------+-----------+----------+--------------+ PTV      Full                                                        +---------+---------------+---------+-----------+----------+--------------+ PERO     Full                                                        +---------+---------------+---------+-----------+----------+--------------+   +---------+---------------+---------+-----------+----------+--------------+ LEFT     CompressibilityPhasicitySpontaneityPropertiesThrombus Aging +---------+---------------+---------+-----------+----------+--------------+ CFV      Full  Yes      Yes                                 +---------+---------------+---------+-----------+----------+--------------+ SFJ      Full                                                        +---------+---------------+---------+-----------+----------+--------------+ FV Prox  Full                                                        +---------+---------------+---------+-----------+----------+--------------+ FV Mid   Full                                                         +---------+---------------+---------+-----------+----------+--------------+ FV DistalFull                                                        +---------+---------------+---------+-----------+----------+--------------+ PFV      Full                                                        +---------+---------------+---------+-----------+----------+--------------+ POP      Full           Yes      Yes                                 +---------+---------------+---------+-----------+----------+--------------+ PTV      Full                                                        +---------+---------------+---------+-----------+----------+--------------+ PERO     Full                                                        +---------+---------------+---------+-----------+----------+--------------+     Summary: RIGHT: - There is no evidence of deep vein thrombosis in the lower extremity.  - No cystic structure found in the popliteal fossa.  LEFT: - There is no evidence of deep vein thrombosis in the lower extremity.  - No cystic structure found in the popliteal fossa.  *See table(s) above for measurements and observations. Electronically signed by Deitra Mayo MD on 04/03/2022 at 3:39:57  PM.    Final    CT CHEST ABDOMEN PELVIS W CONTRAST  Result Date: 04/02/2022 CLINICAL DATA:  Occult malignancy? EXAM: CT CHEST, ABDOMEN, AND PELVIS WITH CONTRAST TECHNIQUE: Multidetector CT imaging of the chest, abdomen and pelvis was performed following the standard protocol during bolus administration of intravenous contrast. RADIATION DOSE REDUCTION: This exam was performed according to the departmental dose-optimization program which includes automated exposure control, adjustment of the mA and/or kV according to patient size and/or use of iterative reconstruction technique. CONTRAST:  100 mL OMNIPAQUE IOHEXOL 350 MG/ML SOLN COMPARISON:  CT chest 05/14/2021 FINDINGS: CT CHEST FINDINGS  Cardiovascular: No significant vascular findings. Normal heart size. No pericardial effusion. Atheromatous calcifications coronary arteries and aorta. Mediastinum/Nodes: No enlarged mediastinal, hilar, or axillary lymph nodes. Thyroid gland is not visualized. Other mediastinal structures including trachea and esophagus are grossly unremarkable. Large paraesophageal hiatal hernia. Lungs/Pleura: Dependent bibasilar subsegmental atelectasis. No pulmonary edema or pneumonia identified. Musculoskeletal: No chest wall mass or suspicious bone lesions identified. CT ABDOMEN PELVIS FINDINGS Hepatobiliary: Subcentimeter hepatic cyst right lobe posteriorly at the dome and no biliary ductal dilatation. Unremarkable gallbladder which contains excreted contrast. Pancreas: Unremarkable. No pancreatic ductal dilatation or surrounding inflammatory changes. Spleen: Normal in size without focal abnormality. Adrenals/Urinary Tract: Adrenal glands are unremarkable. Right kidney 1.6 cm cyst. This does not need to be evaluated any further. No hydronephrosis. No nephrolithiasis. No ureteral stones. Urinary bladder contains excreted contrast. Stomach/Bowel: No bowel dilatation. No evidence for obstruction. Oral contrast progressed to the splenic flexure. Stomach is within normal limits. Appendix appears normal. No evidence of bowel wall thickening, distention, or inflammatory changes. Vascular/Lymphatic: Aortic atherosclerosis. No enlarged abdominal or pelvic lymph nodes. Reproductive: Prostate is unremarkable. Other: No abdominal wall hernia or abnormality. No abdominopelvic ascites. Musculoskeletal: No acute or significant osseous findings. IMPRESSION: 1. Large paraesophageal hiatal hernia. 2. Right kidney cyst. 3. Hepatic cysts. 4. No acute abdominal or pelvic pathology and no cardiopulmonary process. Electronically Signed   By: Sammie Bench M.D.   On: 04/02/2022 14:42   EEG adult  Result Date: 04/02/2022 Lora Havens, MD      04/02/2022  8:43 AM Patient Name: James Richards MRN: [id number] Epilepsy Attending: Lora Havens Referring Physician/Provider: Donnetta Simpers, MD Date: 04/02/2022 Duration: 27.40 mins Patient history: 77 y.o. male with PMH significant for receurrent strokes/TIA, CRAO, peripheral vascular disease, HTN, HLD, recent admission for R MCA stroke 2/2 calcified R MCA M2 embolus thought to be secondary to riht ICA therosclerosis and was discharged on Aspirin and plavix and enrolled in [place] trial who presents with intermittent episodes of confusion/word finding difficulty x 3. EEG to evaluate for seizure Level of alertness: Awake AEDs during EEG study: None Technical aspects: This EEG study was done with scalp electrodes positioned according to the 10-20 International system of electrode placement. Electrical activity was reviewed with band pass filter of 1-70Hz$ , sensitivity of 7 uV/mm, display speed of 24m/sec with a 60Hz$  notched filter applied as appropriate. EEG data were recorded continuously and digitally stored.  Video monitoring was available and reviewed as appropriate. Description: The posterior dominant rhythm consists of 8 Hz activity of moderate voltage (25-35 uV) seen predominantly in posterior head regions, symmetric and reactive to eye opening and eye closing. EEG showed intermittent generalized 5 to 6 Hz theta slowing. Physiologic photic driving was not seen during photic stimulation.  Hyperventilation was not performed.   ABNORMALITY - Intermittent slow, generalized IMPRESSION: This study is suggestive of mild diffuse encephalopathy, nonspecific etiology.  No seizures or epileptiform discharges were seen throughout the recording. Please note lack of epileptiform activity during interictal EEG does not exclude the diagnosis of epilepsy. Lora Havens   CT ANGIO HEAD NECK W WO CM  Result Date: 04/02/2022 CLINICAL DATA:  Sudden onset generalized weakness EXAM: CT ANGIOGRAPHY HEAD AND NECK  TECHNIQUE: Multidetector CT imaging of the head and neck was performed using the standard protocol during bolus administration of intravenous contrast. Multiplanar CT image reconstructions and MIPs were obtained to evaluate the vascular anatomy. Carotid stenosis measurements (when applicable) are obtained utilizing NASCET criteria, using the distal internal carotid diameter as the denominator. RADIATION DOSE REDUCTION: This exam was performed according to the departmental dose-optimization program which includes automated exposure control, adjustment of the mA and/or kV according to patient size and/or use of iterative reconstruction technique. CONTRAST:  53m OMNIPAQUE IOHEXOL 350 MG/ML SOLN COMPARISON:  03/06/2022 CTA head and neck, correlation is also made with CT head 04/01/2022 FINDINGS: CT HEAD FINDINGS For noncontrast findings, please see 04/01/2022 CT head. CTA NECK FINDINGS Aortic arch: 3 arch, with a common origin of the brachiocephalic and left common carotid arteries and aortic origin of the left vertebral artery. Imaged portion shows no evidence of aneurysm or dissection. No significant stenosis of the major arch vessel origins. Right carotid system: No evidence of dissection, occlusion, or hemodynamically significant stenosis (greater than 50%). Redemonstrated ulcerated plaque at the bifurcation and in the proximal right ICA. Left carotid system: Redemonstrated occlusion of the left ICA at its origin. The left CCA and ECA are patent. Vertebral arteries: Chronically occluded right vertebral artery. The left vertebral artery is patent, without significant stenosis, dissection, or occlusion. Skeleton: No acute osseous abnormality. Other neck: No acute finding. Upper chest: No focal pulmonary opacity or pleural effusion. Review of the MIP images confirms the above findings CTA HEAD FINDINGS Anterior circulation: Redemonstrated chronically occluded left ICA, with reconstitution at the terminus. The right  ICA is patent to the terminus. A1 segments patent. Normal anterior communicating artery. Anterior cerebral arteries are patent to their distal aspects. No M1 stenosis or occlusion. A previously noted occluded right MCA branch in the area of a likely calcific embolus (series 7, image 87), is now patent (series 7, image 84). To their MCA branches perfused distal aspects. Posterior circulation: Chronically occluded right vertebral artery. The left vertebral artery is patent to the vertebrobasilar junction. The left PICA is patent proximally. Basilar patent to its distal aspect. Superior cerebellar arteries patent proximally. Fetal origin of the left PCA and near fetal origin of the right PCA. The PCAs are patent to their distal aspects without significant stenosis. Venous sinuses: Not well opacified. Anatomic variants: Fetal origin of the left PCA and near fetal origin of the right PCA. Review of the MIP images confirms the above findings IMPRESSION: 1. Redemonstrated chronically occluded left ICA, with reconstitution at the terminus, and chronically occluded right vertebral artery. 2. A previously noted occluded right MCA branch in the area of a likely calcific embolus is now patent. 3. No other intracranial large vessel occlusion or significant stenosis. 4. No additional hemodynamically significant stenosis in the neck. Redemonstrated ulcerated plaque at the right carotid bifurcation and in the proximal right ICA. Electronically Signed   By: AMerilyn BabaM.D.   On: 04/02/2022 00:26   MR BRAIN WO CONTRAST  Result Date: 04/01/2022 CLINICAL DATA:  Transient episodes of confusion and aphasia, recent stroke EXAM: MRI HEAD WITHOUT CONTRAST TECHNIQUE: Multiplanar, multiecho pulse sequences of the  brain and surrounding structures were obtained without intravenous contrast. COMPARISON:  03/07/2022 MRI head, correlation is also made with 04/01/2022 CT head FINDINGS: Evaluation is limited by motion brain: Scattered,  primarily cortical foci of restricted diffusion with ADC correlates in the bilateral cerebral hemispheres (series 2, images 21-23, 26-29, 34-35, 37-41, 43-44, and 46), as well as in the right caudate head and left external capsule (series 2, image 31), which are new from the prior MRI and likely reflect represent acute to subacute infarcts. Some restricted diffusion with diminished ADC correlate remains in the area of prior infarct in the posterior right frontal and anterior right parietal cortex, consistent with subacute, evolving infarct. No acute hemorrhage, mass, mass effect, or midline shift. No hydrocephalus or definite extra-axial collection. Redemonstrated remote infarcts in the left corona radiata, bilateral parietal cortex, and posterior right temporal cortex. Vascular: Limited evaluation secondary to motion. Skull and upper cervical spine: Limited evaluation secondary to motion. Sinuses/Orbits: No acute finding. Other: Fluid in bilateral mastoid air cells. IMPRESSION: 1. Evaluation is limited by motion. Within this limitation, there are scattered foci of restricted diffusion in the bilateral cerebral hemispheres, as well as in the right caudate head and left external capsule, which are new from the prior MRI and likely reflect acute to early subacute infarcts. Given multiple vascular territories, a central embolic etiology is likely. 2. Evolving subacute, evolving infarct in the posterior right frontal and anterior right parietal cortex. These results were called by telephone at the time of interpretation on 04/01/2022 at 11:00 pm to provider MATTHEW TRIFAN , who verbally acknowledged these results. Electronically Signed   By: Merilyn Baba M.D.   On: 04/01/2022 23:00   CT HEAD WO CONTRAST  Result Date: 04/01/2022 CLINICAL DATA:  Neuro deficit, acute, stroke suspected EXAM: CT HEAD WITHOUT CONTRAST TECHNIQUE: Contiguous axial images were obtained from the base of the skull through the vertex without  intravenous contrast. RADIATION DOSE REDUCTION: This exam was performed according to the departmental dose-optimization program which includes automated exposure control, adjustment of the mA and/or kV according to patient size and/or use of iterative reconstruction technique. COMPARISON:  CT head 03/06/2022, MRI head 03/29/2021 FINDINGS: Brain: Cerebral ventricle sizes are concordant with the degree of cerebral volume loss. Patchy and confluent areas of decreased attenuation are noted throughout the deep and periventricular white matter of the cerebral hemispheres bilaterally, compatible with chronic microvascular ischemic disease. Left frontoparietal and anterior temporal lobe encephalomalacia. Right temporal lobe encephalomalacia. No evidence of large-territorial acute infarction. No parenchymal hemorrhage. No mass lesion. No extra-axial collection. No mass effect or midline shift. No hydrocephalus. Basilar cisterns are patent. Vascular: No hyperdense vessel. Atherosclerotic calcifications are present within the cavernous internal carotid arteries. Skull: No acute fracture or focal lesion. Sinuses/Orbits: Paranasal sinuses and mastoid air cells are clear. The orbits are unremarkable. Other: None. IMPRESSION: No acute intracranial abnormality in a patient with chronic bilateral cerebral infarction. Electronically Signed   By: Iven Finn M.D.   On: 04/01/2022 19:11   IR CT Head Ltd  Result Date: 03/09/2022 INDICATION: New onset of right gaze deviation and occluded inferior division of the right middle cerebral artery in the M3 region. EXAM: 1. EMERGENT LARGE VESSEL OCCLUSION THROMBOLYSIS (anterior CIRCULATION) COMPARISON:  CT angiogram of the head and neck 03/06/2022. MEDICATIONS: Ancef 2 g IV antibiotic was administered within 1 hour of the procedure. ANESTHESIA/SEDATION: General anesthesia. CONTRAST:  Omnipaque 300 25 cc. FLUOROSCOPY TIME:  Fluoroscopy Time: 5 minutes 6 seconds (724 mGy). COMPLICATIONS:  None  immediate. TECHNIQUE: Following a full explanation of the procedure along with the potential associated complications, an informed witnessed consent was obtained. The risks of intracranial hemorrhage of 10%, worsening neurological deficit, ventilator dependency, death and inability to revascularize were all reviewed in detail with the patient's sister. The patient was then put under general anesthesia by the Department of Anesthesiology at [facility]. The right groin was prepped and draped in the usual sterile fashion. Thereafter using modified Seldinger technique, transfemoral access into the right common femoral artery was obtained without difficulty. Over a 0.035 inch guidewire an 8 French 25 cm Pinnacle sheath was inserted. Through this, and also over a 0.035 inch guidewire a 5 [place] JB 1 catheter was advanced to the aortic arch region and selectively positioned in the right common carotid artery. FINDINGS: The right common carotid arteriogram demonstrates the right external carotid artery to be mild narrowed at its origin. Its branches opacify normally. The right internal carotid artery at the bulb demonstrates a focal outpouching from its anterior wall. No intraluminal filling defects are evident. There is moderate tortuosity of the proximal 1/3 of the right internal carotid artery. More distally, the vessel is seen to opacify to the cranial skull base. The petrous, the cavernous and the supraclinoid right ICA demonstrate wide patency. The right anterior cerebral artery opacifies into the capillary and venous phases. Prompt opacification of the left anterior cerebral artery and the left middle cerebral artery distribution is noted via the anterior communicating artery from the right. The right middle cerebral artery demonstrates inferior division to be widely patent. The superior division demonstrates a focal area of caliber narrowing of the mid perisylvian branch in the M3 segment with partial  distal reconstitution. Caliber of the M3 region of the superior division was too small for aspiration thrombectomy, or for stent retriever procedures given the high-risk of perforation associated with the calcified embolus. The balloon guide was removed. An 8 French Angio-Seal closure device was applied for hemostasis at the right groin puncture site. Distal pulses remained Dopplerable in both feet unchanged from prior to the procedure. Patient was extubated. Upon recovery, the patient remained agitated in simple commands. Minimal movement was seen in the left upper extremity. Patient moved the left lower and the right upper and lower extremities spontaneously. He was then returned to the neuro ICU for further management. PROCEDURE: As above. IMPRESSION: Angiographic occlusion of a right middle cerebral perisylvian superior division branch in the M3 region. PLAN: As per referring MD. Electronically Signed   By: Luanne Bras M.D.   On: 03/09/2022 08:42   IR PERCUTANEOUS ART THROMBECTOMY/INFUSION INTRACRANIAL INC DIAG ANGIO  Result Date: 03/09/2022 INDICATION: New onset of right gaze deviation and occluded inferior division of the right middle cerebral artery in the M3 region. EXAM: 1. EMERGENT LARGE VESSEL OCCLUSION THROMBOLYSIS (anterior CIRCULATION) COMPARISON:  CT angiogram of the head and neck 03/06/2022. MEDICATIONS: Ancef 2 g IV antibiotic was administered within 1 hour of the procedure. ANESTHESIA/SEDATION: General anesthesia. CONTRAST:  Omnipaque 300 25 cc. FLUOROSCOPY TIME:  Fluoroscopy Time: 5 minutes 6 seconds (724 mGy). COMPLICATIONS: None immediate. TECHNIQUE: Following a full explanation of the procedure along with the potential associated complications, an informed witnessed consent was obtained. The risks of intracranial hemorrhage of 10%, worsening neurological deficit, ventilator dependency, death and inability to revascularize were all reviewed in detail with the patient's sister. The  patient was then put under general anesthesia by the Department of Anesthesiology at [facility]. The right groin  was prepped and draped in the usual sterile fashion. Thereafter using modified Seldinger technique, transfemoral access into the right common femoral artery was obtained without difficulty. Over a 0.035 inch guidewire an 8 French 25 cm Pinnacle sheath was inserted. Through this, and also over a 0.035 inch guidewire a 5 [place] JB 1 catheter was advanced to the aortic arch region and selectively positioned in the right common carotid artery. FINDINGS: The right common carotid arteriogram demonstrates the right external carotid artery to be mild narrowed at its origin. Its branches opacify normally. The right internal carotid artery at the bulb demonstrates a focal outpouching from its anterior wall. No intraluminal filling defects are evident. There is moderate tortuosity of the proximal 1/3 of the right internal carotid artery. More distally, the vessel is seen to opacify to the cranial skull base. The petrous, the cavernous and the supraclinoid right ICA demonstrate wide patency. The right anterior cerebral artery opacifies into the capillary and venous phases. Prompt opacification of the left anterior cerebral artery and the left middle cerebral artery distribution is noted via the anterior communicating artery from the right. The right middle cerebral artery demonstrates inferior division to be widely patent. The superior division demonstrates a focal area of caliber narrowing of the mid perisylvian branch in the M3 segment with partial distal reconstitution. Caliber of the M3 region of the superior division was too small for aspiration thrombectomy, or for stent retriever procedures given the high-risk of perforation associated with the calcified embolus. The balloon guide was removed. An 8 French Angio-Seal closure device was applied for hemostasis at the right groin puncture site. Distal  pulses remained Dopplerable in both feet unchanged from prior to the procedure. Patient was extubated. Upon recovery, the patient remained agitated in simple commands. Minimal movement was seen in the left upper extremity. Patient moved the left lower and the right upper and lower extremities spontaneously. He was then returned to the neuro ICU for further management. PROCEDURE: As above. IMPRESSION: Angiographic occlusion of a right middle cerebral perisylvian superior division branch in the M3 region. PLAN: As per referring MD. Electronically Signed   By: Luanne Bras M.D.   On: 03/09/2022 08:42   ECHOCARDIOGRAM COMPLETE  Result Date: 03/07/2022    ECHOCARDIOGRAM REPORT   Patient Name:   James Richards Date of Exam: 03/07/2022 Medical Rec #:  [id number]     Height:       65.0 in Accession #:    [id number]    Weight:       183.0 lb Date of Birth:  04/23/1945    BSA:          1.905 m Patient Age:    29 years      BP:           116/85 mmHg Patient Gender: M             HR:           83 bpm. Exam Location:  Inpatient Procedure: 2D Echo, Color Doppler and Cardiac Doppler Indications:    Stroke  History:        Patient has prior history of Echocardiogram examinations, most                 recent 03/30/2021. CHF, Carotid Disease, TIA and Stroke,                 Arrythmias:Atrial Fibrillation; Risk Factors:Hypertension,  Former Smoker and Dyslipidemia. 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  Sonographer:    Eartha Inch Referring Phys: [id number] Rikki Spearing  Sonographer Comments: Image acquisition challenging due to patient body habitus and Image acquisition challenging due to respiratory motion. IMPRESSIONS  1. Left ventricular ejection fraction, by estimation, is 60 to 65%. The left ventricle has normal function. The left ventricle has no regional wall motion abnormalities. Left ventricular diastolic  parameters are consistent with Grade II diastolic dysfunction (pseudonormalization). Elevated left ventricular end-diastolic pressure.  2. Right ventricular systolic function is normal. The right ventricular size is normal.  3. The mitral valve is normal in structure. No evidence of mitral valve regurgitation. No evidence of mitral stenosis.  4. The aortic valve is normal in structure. Aortic valve regurgitation is not visualized. No aortic stenosis is present.  5. The inferior vena cava is normal in size with greater than 50% respiratory variability, suggesting right atrial pressure of 3 mmHg. FINDINGS  Left Ventricle: Left ventricular ejection fraction, by estimation, is 60 to 65%. The left ventricle has normal function. The left ventricle has no regional wall motion abnormalities. The left ventricular internal cavity size was normal in size. There is  no left ventricular hypertrophy. Left ventricular diastolic parameters are consistent with Grade II diastolic dysfunction (pseudonormalization). Elevated left ventricular end-diastolic pressure. Right Ventricle: The right ventricular size is normal. No increase in right ventricular wall thickness. Right ventricular systolic function is normal. Left Atrium: Left atrial size was normal in size. Right Atrium: Right atrial size was normal in size. Pericardium: There is no evidence of pericardial effusion. Mitral Valve: The mitral valve is normal in structure. No evidence of mitral valve regurgitation. No evidence of mitral valve stenosis. Tricuspid Valve: The tricuspid valve is normal in structure. Tricuspid valve regurgitation is not demonstrated. No evidence of tricuspid stenosis. Aortic Valve: The aortic valve is normal in structure. Aortic valve regurgitation is not visualized. No aortic stenosis is present. Pulmonic Valve: The pulmonic valve was normal in structure. Pulmonic valve regurgitation is not visualized. No evidence of pulmonic stenosis. Aorta: The aortic  root and ascending aorta are structurally normal, with no evidence of dilitation. Venous: The inferior vena cava is normal in size with greater than 50% respiratory variability, suggesting right atrial pressure of 3 mmHg. IAS/Shunts: No atrial level shunt detected by color flow Doppler.  LEFT VENTRICLE PLAX 2D LVIDd:         3.90 cm     Diastology LVIDs:         2.90 cm     LV e' medial:    5.87 cm/s LV PW:         1.00 cm     LV E/e' medial:  15.8 LV IVS:        1.00 cm     LV e' lateral:   7.72 cm/s LVOT diam:     2.00 cm     LV E/e' lateral: 12.0 LV SV:         88 LV SV Index:   46 LVOT Area:     3.14 cm  LV Volumes (MOD) LV vol d, MOD A2C: 58.7 ml LV vol d, MOD A4C: 72.7 ml LV vol s, MOD A2C: 17.1 ml LV vol s, MOD A4C:  26.9 ml LV SV MOD A2C:     41.6 ml LV SV MOD A4C:     72.7 ml LV SV MOD BP:      45.4 ml RIGHT VENTRICLE RV S prime:     11.10 cm/s TAPSE (M-mode): 1.6 cm LEFT ATRIUM             Index        RIGHT ATRIUM           Index LA diam:        3.60 cm 1.89 cm/m   RA Area:     11.60 cm LA Vol (A2C):   27.2 ml 14.28 ml/m  RA Volume:   19.90 ml  10.45 ml/m LA Vol (A4C):   35.6 ml 18.69 ml/m LA Biplane Vol: 32.0 ml 16.80 ml/m  AORTIC VALVE              PULMONIC VALVE LVOT Vmax:   157.00 cm/s  PR End Diast Vel: 3.79 msec LVOT Vmean:  100.000 cm/s LVOT VTI:    0.280 m  AORTA Ao Root diam: 3.10 cm Ao Asc diam:  3.50 cm MITRAL VALVE MV Area (PHT): 3.77 cm    SHUNTS MV Decel Time: 201 msec    Systemic VTI:  0.28 m MV E velocity: 92.90 cm/s  Systemic Diam: 2.00 cm MV A velocity: 81.80 cm/s MV E/A ratio:  1.14 Skeet Latch MD Electronically signed by Skeet Latch MD Signature Date/Time: 03/07/2022/1:38:17 PM    Final    MR BRAIN WO CONTRAST  Result Date: 03/07/2022 CLINICAL DATA:  Stroke follow-up EXAM: MRI HEAD WITHOUT CONTRAST TECHNIQUE: Multiplanar, multiecho pulse sequences of the brain and surrounding structures were obtained without intravenous contrast. COMPARISON:  Head CT and CTA from  yesterday FINDINGS: Brain: Moderate to large area of restricted diffusion primarily affecting posterior frontal and parietal cortex on the right. Patchy remote bilateral cerebral infarcts, most extensive on the left in the corona radiata and on the right at the superior and posterior temporal cortex. Ischemic gliosis in the deep cerebral white matter. Generalized cerebral volume loss. No hydrocephalus or masslike finding Vascular: Grossly preserved flow voids Skull and upper cervical spine: No gross marrow lesion Sinuses/Orbits: No acute finding Other: Pervasive and significant motion artifact, pathology could easily be obscured. IMPRESSION: 1. Acute right MCA branch infarct affecting fronto-parietal cortex. 2. Multiple chronic infarcts. 3. Significant motion degradation. Electronically Signed   By: Jorje Guild M.D.   On: 03/07/2022 06:03   CT CEREBRAL PERFUSION W CONTRAST  Result Date: 03/06/2022 EXAM: CT PERFUSION BRAIN TECHNIQUE: Multiphase CT imaging of the brain was performed following IV bolus contrast injection. Subsequent parametric perfusion maps were calculated using RAPID software. RADIATION DOSE REDUCTION: This exam was performed according to the departmental dose-optimization program which includes automated exposure control, adjustment of the mA and/or kV according to patient size and/or use of iterative reconstruction technique. CONTRAST:  52m OMNIPAQUE IOHEXOL 350 MG/ML SOLN, 4445mOMNIPAQUE IOHEXOL 350 MG/ML SOLN COMPARISON:  Same day CTA and CT head. FINDINGS: CT Brain Perfusion Findings: CBF (<30%) Volume: 45m57merfusion (Tmax>6.0s) volume: 45m5msmatch Volume: 45mL5mECTS on noncontrast CT Head: 10 today. Infarct Core: 0 mL Infarction Location:None identified IMPRESSION: 1. Approximally 23 mL of penumbra in the right MCA territory, correlating with occluded vessel seen on CTA. 2. No evidence of core infarct. Findings discussed with Dr. StackQuinn Axetelephone at 3:56 p.m. Electronically  Signed   By: FredeMargaretha Sheffield   On: 03/06/2022 15:58  CT ANGIO HEAD NECK W WO CM (CODE STROKE)  Result Date: 03/06/2022 CLINICAL DATA:  Neuro deficit, acute, stroke suspected L sided weakness R gaze preference EXAM: CT ANGIOGRAPHY HEAD AND NECK TECHNIQUE: Multidetector CT imaging of the head and neck was performed using the standard protocol during bolus administration of intravenous contrast. Multiplanar CT image reconstructions and MIPs were obtained to evaluate the vascular anatomy. Carotid stenosis measurements (when applicable) are obtained utilizing NASCET criteria, using the distal internal carotid diameter as the denominator. RADIATION DOSE REDUCTION: This exam was performed according to the departmental dose-optimization program which includes automated exposure control, adjustment of the mA and/or kV according to patient size and/or use of iterative reconstruction technique. COMPARISON:  CTA Head March 29, 2021. FINDINGS: CTA NECK FINDINGS Aortic arch: Great vessel origins are patent. Right carotid system: Ulcerated atherosclerosis at the carotid bifurcation without greater than 50% stenosis. Left carotid system: Left ICA is occluded at its origin. Non opacified more distal ICA in the neck. Vertebral arteries: Chronically occluded right vertebral artery. Skeleton: No acute findings on limited assessment. Other neck: No acute findings on limited assessment. Upper chest: Visualized lung apices are clear when accounting for expiratory changes. Review of the MIP images confirms the above findings CTA HEAD FINDINGS Anterior circulation: Chronically occluded left ICA with reconstitution at the carotid terminus. Right ICA and right M1 MCA is patent. Occluded right mid M2 MCA branch in the region of a probable calcific embolus (series 10, image 82), which appears new from prior. Bilateral ACAs are patent. Posterior circulation: Chronically clear right vertebral artery. Left vertebral artery, basilar  artery and bilateral posterior cerebral arteries are patent without proximal high-grade stenosis. Left fetal type PCA. Venous sinuses: As permitted by contrast timing, patent. Review of the MIP images confirms the above findings IMPRESSION: 1. Occluded right mid M2 MCA (series 10, image 82) likely due to calcified embolus. 2. Chronically occluded right vertebral artery and left ICA. FIndings discussed with Dr. Quinn Axe at [facility] PM. Electronically Signed   By: Margaretha Sheffield M.D.   On: 03/06/2022 15:31   CT HEAD CODE STROKE WO CONTRAST  Result Date: 03/06/2022 CLINICAL DATA:  Code stroke.  Neuro deficit, acute, stroke suspected EXAM: CT HEAD WITHOUT CONTRAST TECHNIQUE: Contiguous axial images were obtained from the base of the skull through the vertex without intravenous contrast. RADIATION DOSE REDUCTION: This exam was performed according to the departmental dose-optimization program which includes automated exposure control, adjustment of the mA and/or kV according to patient size and/or use of iterative reconstruction technique. COMPARISON:  CT head March 29, 2021. FINDINGS: Brain: Similar remote infarcts in the left anterior temporal lobe, left MCA territory and right temporal lobe. No evidence of acute large vascular territory infarct, acute hemorrhage, mass lesion, midline shift or hydrocephalus. Vascular: No hyperdense vessel identified. Calcific atherosclerosis. Skull: No acute fracture. Sinuses/Orbits: Clear sinuses.  No acute orbital findings. Other: No mastoid effusions. ASPECTS [facility] Stroke Program Early CT Score) total score (0-10 with 10 being normal): 10. IMPRESSION: 1. No evidence of acute intracranial abnormality.  ASPECTS 10. 2. Similar remote infarcts, as above. Code stroke imaging results were communicated on 03/06/2022 at 3:19 pm to provider [facility] via secure text paging. Electronically Signed   By: Margaretha Sheffield M.D.   On: 03/06/2022 15:19     PHYSICAL EXAM  Temp:  [97.6 F (36.4  C)-98.5 F (36.9 C)] 97.9 F (36.6 C) (02/19 1518) Pulse Rate:  [54-71] 67 (02/19 1518) Resp:  [16-18] 18 (02/19 1518) BP: (  95-156)/(41-80) 114/62 (02/19 1518) SpO2:  [96 %-100 %] 99 % (02/19 1518)  General - Well nourished, well developed, in no apparent distress.  Ophthalmologic - fundi not visualized due to noncooperation.  Cardiovascular - Regular rhythm and rate.  Neuro - awake, alert, eyes open, severe hearing loss, difficulty with exam and questions.  However, orientated to age, place, time and people, but with severe dysarthria due to previous surgery. No aphasia, paucity of speech, following most simple commands. Able to name and repeat. No gaze palsy, tracking bilaterally, blinking to visual threat bilaterally, PERRL. No facial droop. Tongue midline. Bilateral UEs 5/5, no drift. Bilaterally LEs 5/5, no drift. Sensation symmetrical bilaterally, b/l FTN intact, gait not tested.     ASSESSMENT/PLAN James Richards is a 77 y.o. 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 No tPA given due to no clear symptoms.    Stroke:  bilateral small embolic shower, embolic pattern, secondary to unclear source CT no acute abnormality MRI bilateral small embolic shower CT head and neck right M2 occlusion now resolved Pan CT no malignancy 2D Echo EF 60 to 65% LE venous Doppler no DVT loop recorder placed before discharge LDL 43 HgbA1c 5.7 in 02/2022 Lovenox for VTE prophylaxis clopidogrel 75 mg daily and librexia trial meds prior to admission, now on ASA 81 and brilinta 90 bid DAPT for one month and then plavix alone. Discontinue Librexia trial meds. I have informed pt, cousin and pharmacy. Ongoing aggressive stroke risk factor management Therapy recommendations:  none Disposition:  Pending  History of stroke 11/2014 admitted for angiogram for left ICA stenosis.  After procedure patient developed aphasia.  MRI showed right MCA and tiny left occipital infarcts.  Stroke likely related to procedure.  Angiogram showed left ICA/CCA 60 to 70% stenosis.  CTA neck showed left ICA 75 to 80% stenosis.  Carotid Doppler left ICA 60 to 79% stenosis.  EF 55 to 60%.  LDL 44.  Patient discharged on DAPT and Lipitor 80. 03/2021 patient admitted for left CRAO with partial improvement.  CTA head and neck showed left ICA occluded.  Right VA occlusion.  Bilateral P2 stenosis.  MRI showed no acute infarct.  EF 60 to 65%.  LDL 47, A1c 5.8.  Again discharged on DAPT and Lipitor 80. 02/2022 admitted for left-sided weakness and slurred speech.  Found to have right M2 occlusion due to calcified embolus.  MRI showed right MCA infarct.  LDL 36, A1c 5.7, enrolled in [place] stroke trial.  Also discharged on DAPT and Lipitor 80.  Carotid stenosis/occlusion CT head and neck chronic left ICA occlusion and right ICA atherosclerosis No intervention needed given chronic occlusion  Hypertension Stable Long term BP goal normotensive  Hyperlipidemia Home meds: Lipitor 80 LDL 43, goal < 70 Now on Lipitor 80 Continue statin at discharge  Other Stroke Risk Factors Advanced Age >/= 91  Former Cigarette smoker Obesity, Body mass index is 30.47 kg/m., BMI >/= 30 associated with increased stroke risk, recommend weight loss, diet and exercise as appropriate  Coronary artery disease CHF PVD   Other Active Problems Laryngeal/tongue cancer status post chemotherapy, radiation and partial glossectomy RLS Pulmonary fibrosis with chronic hypoxia  Hospital day # 2  Neurology will sign off. Please call with questions. Pt will follow up with Ms Lomax at [facility] on 04/19/2022. Thanks for the  consult.   Rosalin Hawking, MD PhD Stroke Neurology 04/04/2022 4:38 PM    To contact Stroke Continuity provider, please refer to  [email or web address] After hours, contact General Neurology

## 2022-04-04 NOTE — Discharge Summary (Signed)
[facility]  Physician Discharge Summary   Patient ID: James Richards MRN: [id number] DOB/AGE: May 20, 1945 77 y.o.  Admit date: 04/01/2022 Discharge date:   04/04/2022   PCP: Pcp, No  DISCHARGE DIAGNOSES:  Acute embolic stroke (HCC) PSVT Hypothyroidism Restless leg syndrome   RECOMMENDATIONS FOR OUTPATIENT FOLLOW UP: Ambulatory referral sent to neurology Cardiology to arrange OP f/u after loop recorder placement.    Home Health:None  Equipment/Devices:None   CODE STATUS:Full Code   DISCHARGE CONDITION: fair  Diet recommendation: Heart healthy  INITIAL HISTORY:  77 y.o. male with medical history significant of recurrent TIAs, CRAO in Feb 2023. R MCA stroke last month s/p embolectomy. 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 Pt with residual R sided deficits following the R MCA stroke.  Presented with speech difficulty and confusion.  Symptoms had improved while he was in the emergency department.  Subsequently hospitalized for further management.   Consultants: Neurology   Procedures: Lower extremity venous Doppler. Loop recorder placement today prior to discharge.   HOSPITAL COURSE:   Acute stroke MRI raises concern for embolic stroke.  Patient underwent CT angiogram head and neck. Neurology is consulted. LDL is 43.  HbA1c is 5.7 when checked recently.   Patient had an echocardiogram recently in January when he was hospitalized for stroke.  Normal EF was noted.  Grade 2 diastolic dysfunction was noted.  No need to repeat echocardiogram at this time. PT OT speech therapy evaluation. Permissive hypertension. Patient does have some residual left-sided deficits from his previous stroke in January. CT of the chest abdomen pelvis was ordered to look for occult malignancy.  No concerns identified.  He does have a large hiatal hernia but is asymptomatic currently. Venous Doppler study of the lower extremity negative for VDT>  Loop  recorder to be placed prior to discharge. Per neurology patient to be on aspirin and brilinta for 1 month and then on plavix alone.    Paroxysmal supraventricular tachycardia Continue metoprolol at discharge. Will hold ARB.    Hypothyroidism Continue with levothyroxine.   Restless leg syndrome Continue with ropinirole.   Obesity Estimated body mass index is 30.74 kg/m as calculated from the following:   Height as of this encounter: 5' 5"$  (1.651 m).   Weight as of this encounter: 83.8 kg.  Patient is stable. [place] for discharge home after loop recorder placement.  PERTINENT LABS:  The results of significant diagnostics from this hospitalization (including imaging, microbiology, ancillary and laboratory) are listed below for reference.     Labs:   Basic Metabolic Panel: Recent Labs  Lab 04/01/22 1755 04/01/22 1758 04/03/22 0344  NA 142 142 138  K 4.3 4.3 4.0  CL 106 105 105  CO2 25  --  24  GLUCOSE 101* 97 90  BUN 21 25* 11  CREATININE 1.19 1.10 1.01  CALCIUM 9.4  --  9.0   Liver Function Tests: Recent Labs  Lab 04/01/22 1755  AST 31  ALT 31  ALKPHOS 78  BILITOT 0.4  PROT 6.4*  ALBUMIN 3.5    CBC: Recent Labs  Lab 04/01/22 1755 04/01/22 1758 04/03/22 0344  WBC 7.4  --  5.6  NEUTROABS 5.6  --   --   HGB 13.1 13.6 12.9*  HCT 39.5 40.0 38.1*  MCV 95.9  --  92.0  PLT 252  --  233      IMAGING STUDIES VAS [place] LOWER EXTREMITY VENOUS (DVT)  Result Date: 04/03/2022  Lower Venous  DVT Study Patient Name:  James Richards  Date of Exam:   04/03/2022 Medical Rec #: [id number]      Accession #:    [id number] Date of Birth: January 04, 1946     Patient Gender: M Patient Age:   86 years Exam Location:  [facility] Procedure:      VAS [place] LOWER EXTREMITY VENOUS (DVT) Referring Phys: Cornelius Moras XU --------------------------------------------------------------------------------  Indications: Stroke.  Risk Factors: None identified. Limitations: Poor ultrasound/tissue  interface. Comparison Study: No prior studies. Performing Technologist: Oliver Hum RVT  Examination Guidelines: A complete evaluation includes B-mode imaging, spectral Doppler, color Doppler, and power Doppler as needed of all accessible portions of each vessel. Bilateral testing is considered an integral part of a complete examination. Limited examinations for reoccurring indications may be performed as noted. The reflux portion of the exam is performed with the patient in reverse Trendelenburg.  +---------+---------------+---------+-----------+----------+--------------+ RIGHT    CompressibilityPhasicitySpontaneityPropertiesThrombus Aging +---------+---------------+---------+-----------+----------+--------------+ CFV      Full           Yes      Yes                                 +---------+---------------+---------+-----------+----------+--------------+ SFJ      Full                                                        +---------+---------------+---------+-----------+----------+--------------+ FV Prox  Full                                                        +---------+---------------+---------+-----------+----------+--------------+ FV Mid   Full                                                        +---------+---------------+---------+-----------+----------+--------------+ FV DistalFull                                                        +---------+---------------+---------+-----------+----------+--------------+ PFV      Full                                                        +---------+---------------+---------+-----------+----------+--------------+ POP      Full           Yes      Yes                                 +---------+---------------+---------+-----------+----------+--------------+ PTV      Full                                                         +---------+---------------+---------+-----------+----------+--------------+  PERO     Full                                                        +---------+---------------+---------+-----------+----------+--------------+   +---------+---------------+---------+-----------+----------+--------------+ LEFT     CompressibilityPhasicitySpontaneityPropertiesThrombus Aging +---------+---------------+---------+-----------+----------+--------------+ CFV      Full           Yes      Yes                                 +---------+---------------+---------+-----------+----------+--------------+ SFJ      Full                                                        +---------+---------------+---------+-----------+----------+--------------+ FV Prox  Full                                                        +---------+---------------+---------+-----------+----------+--------------+ FV Mid   Full                                                        +---------+---------------+---------+-----------+----------+--------------+ FV DistalFull                                                        +---------+---------------+---------+-----------+----------+--------------+ PFV      Full                                                        +---------+---------------+---------+-----------+----------+--------------+ POP      Full           Yes      Yes                                 +---------+---------------+---------+-----------+----------+--------------+ PTV      Full                                                        +---------+---------------+---------+-----------+----------+--------------+ PERO     Full                                                        +---------+---------------+---------+-----------+----------+--------------+  Summary: RIGHT: - There is no evidence of deep vein thrombosis in the lower extremity.  - No cystic structure found in  the popliteal fossa.  LEFT: - There is no evidence of deep vein thrombosis in the lower extremity.  - No cystic structure found in the popliteal fossa.  *See table(s) above for measurements and observations. Electronically signed by Deitra Mayo MD on 04/03/2022 at 3:39:57 PM.    Final    CT CHEST ABDOMEN PELVIS W CONTRAST  Result Date: 04/02/2022 CLINICAL DATA:  Occult malignancy? EXAM: CT CHEST, ABDOMEN, AND PELVIS WITH CONTRAST TECHNIQUE: Multidetector CT imaging of the chest, abdomen and pelvis was performed following the standard protocol during bolus administration of intravenous contrast. RADIATION DOSE REDUCTION: This exam was performed according to the departmental dose-optimization program which includes automated exposure control, adjustment of the mA and/or kV according to patient size and/or use of iterative reconstruction technique. CONTRAST:  100 mL OMNIPAQUE IOHEXOL 350 MG/ML SOLN COMPARISON:  CT chest 05/14/2021 FINDINGS: CT CHEST FINDINGS Cardiovascular: No significant vascular findings. Normal heart size. No pericardial effusion. Atheromatous calcifications coronary arteries and aorta. Mediastinum/Nodes: No enlarged mediastinal, hilar, or axillary lymph nodes. Thyroid gland is not visualized. Other mediastinal structures including trachea and esophagus are grossly unremarkable. Large paraesophageal hiatal hernia. Lungs/Pleura: Dependent bibasilar subsegmental atelectasis. No pulmonary edema or pneumonia identified. Musculoskeletal: No chest wall mass or suspicious bone lesions identified. CT ABDOMEN PELVIS FINDINGS Hepatobiliary: Subcentimeter hepatic cyst right lobe posteriorly at the dome and no biliary ductal dilatation. Unremarkable gallbladder which contains excreted contrast. Pancreas: Unremarkable. No pancreatic ductal dilatation or surrounding inflammatory changes. Spleen: Normal in size without focal abnormality. Adrenals/Urinary Tract: Adrenal glands are unremarkable. Right  kidney 1.6 cm cyst. This does not need to be evaluated any further. No hydronephrosis. No nephrolithiasis. No ureteral stones. Urinary bladder contains excreted contrast. Stomach/Bowel: No bowel dilatation. No evidence for obstruction. Oral contrast progressed to the splenic flexure. Stomach is within normal limits. Appendix appears normal. No evidence of bowel wall thickening, distention, or inflammatory changes. Vascular/Lymphatic: Aortic atherosclerosis. No enlarged abdominal or pelvic lymph nodes. Reproductive: Prostate is unremarkable. Other: No abdominal wall hernia or abnormality. No abdominopelvic ascites. Musculoskeletal: No acute or significant osseous findings. IMPRESSION: 1. Large paraesophageal hiatal hernia. 2. Right kidney cyst. 3. Hepatic cysts. 4. No acute abdominal or pelvic pathology and no cardiopulmonary process. Electronically Signed   By: Sammie Bench M.D.   On: 04/02/2022 14:42   EEG adult  Result Date: 04/02/2022 Lora Havens, MD     04/02/2022  8:43 AM Patient Name: James Richards MRN: [id number] Epilepsy Attending: Lora Havens Referring Physician/Provider: Donnetta Simpers, MD Date: 04/02/2022 Duration: 27.40 mins Patient history: 77 y.o. male with PMH significant for receurrent strokes/TIA, CRAO, peripheral vascular disease, HTN, HLD, recent admission for R MCA stroke 2/2 calcified R MCA M2 embolus thought to be secondary to riht ICA therosclerosis and was discharged on Aspirin and plavix and enrolled in [place] trial who presents with intermittent episodes of confusion/word finding difficulty x 3. EEG to evaluate for seizure Level of alertness: Awake AEDs during EEG study: None Technical aspects: This EEG study was done with scalp electrodes positioned according to the 10-20 International system of electrode placement. Electrical activity was reviewed with band pass filter of 1-70Hz$ , sensitivity of 7 uV/mm, display speed of 6m/sec with a 60Hz$  notched filter applied as  appropriate. EEG data were recorded continuously and digitally stored.  Video monitoring was available and reviewed as appropriate. Description: The posterior  dominant rhythm consists of 8 Hz activity of moderate voltage (25-35 uV) seen predominantly in posterior head regions, symmetric and reactive to eye opening and eye closing. EEG showed intermittent generalized 5 to 6 Hz theta slowing. Physiologic photic driving was not seen during photic stimulation.  Hyperventilation was not performed.   ABNORMALITY - Intermittent slow, generalized IMPRESSION: This study is suggestive of mild diffuse encephalopathy, nonspecific etiology. No seizures or epileptiform discharges were seen throughout the recording. Please note lack of epileptiform activity during interictal EEG does not exclude the diagnosis of epilepsy. Lora Havens   CT ANGIO HEAD NECK W WO CM  Result Date: 04/02/2022 CLINICAL DATA:  Sudden onset generalized weakness EXAM: CT ANGIOGRAPHY HEAD AND NECK TECHNIQUE: Multidetector CT imaging of the head and neck was performed using the standard protocol during bolus administration of intravenous contrast. Multiplanar CT image reconstructions and MIPs were obtained to evaluate the vascular anatomy. Carotid stenosis measurements (when applicable) are obtained utilizing NASCET criteria, using the distal internal carotid diameter as the denominator. RADIATION DOSE REDUCTION: This exam was performed according to the departmental dose-optimization program which includes automated exposure control, adjustment of the mA and/or kV according to patient size and/or use of iterative reconstruction technique. CONTRAST:  31m OMNIPAQUE IOHEXOL 350 MG/ML SOLN COMPARISON:  03/06/2022 CTA head and neck, correlation is also made with CT head 04/01/2022 FINDINGS: CT HEAD FINDINGS For noncontrast findings, please see 04/01/2022 CT head. CTA NECK FINDINGS Aortic arch: 3 arch, with a common origin of the brachiocephalic and left  common carotid arteries and aortic origin of the left vertebral artery. Imaged portion shows no evidence of aneurysm or dissection. No significant stenosis of the major arch vessel origins. Right carotid system: No evidence of dissection, occlusion, or hemodynamically significant stenosis (greater than 50%). Redemonstrated ulcerated plaque at the bifurcation and in the proximal right ICA. Left carotid system: Redemonstrated occlusion of the left ICA at its origin. The left CCA and ECA are patent. Vertebral arteries: Chronically occluded right vertebral artery. The left vertebral artery is patent, without significant stenosis, dissection, or occlusion. Skeleton: No acute osseous abnormality. Other neck: No acute finding. Upper chest: No focal pulmonary opacity or pleural effusion. Review of the MIP images confirms the above findings CTA HEAD FINDINGS Anterior circulation: Redemonstrated chronically occluded left ICA, with reconstitution at the terminus. The right ICA is patent to the terminus. A1 segments patent. Normal anterior communicating artery. Anterior cerebral arteries are patent to their distal aspects. No M1 stenosis or occlusion. A previously noted occluded right MCA branch in the area of a likely calcific embolus (series 7, image 87), is now patent (series 7, image 84). To their MCA branches perfused distal aspects. Posterior circulation: Chronically occluded right vertebral artery. The left vertebral artery is patent to the vertebrobasilar junction. The left PICA is patent proximally. Basilar patent to its distal aspect. Superior cerebellar arteries patent proximally. Fetal origin of the left PCA and near fetal origin of the right PCA. The PCAs are patent to their distal aspects without significant stenosis. Venous sinuses: Not well opacified. Anatomic variants: Fetal origin of the left PCA and near fetal origin of the right PCA. Review of the MIP images confirms the above findings IMPRESSION: 1.  Redemonstrated chronically occluded left ICA, with reconstitution at the terminus, and chronically occluded right vertebral artery. 2. A previously noted occluded right MCA branch in the area of a likely calcific embolus is now patent. 3. No other intracranial large vessel occlusion or significant stenosis. 4.  No additional hemodynamically significant stenosis in the neck. Redemonstrated ulcerated plaque at the right carotid bifurcation and in the proximal right ICA. Electronically Signed   By: Merilyn Baba M.D.   On: 04/02/2022 00:26   MR BRAIN WO CONTRAST  Result Date: 04/01/2022 CLINICAL DATA:  Transient episodes of confusion and aphasia, recent stroke EXAM: MRI HEAD WITHOUT CONTRAST TECHNIQUE: Multiplanar, multiecho pulse sequences of the brain and surrounding structures were obtained without intravenous contrast. COMPARISON:  03/07/2022 MRI head, correlation is also made with 04/01/2022 CT head FINDINGS: Evaluation is limited by motion brain: Scattered, primarily cortical foci of restricted diffusion with ADC correlates in the bilateral cerebral hemispheres (series 2, images 21-23, 26-29, 34-35, 37-41, 43-44, and 46), as well as in the right caudate head and left external capsule (series 2, image 31), which are new from the prior MRI and likely reflect represent acute to subacute infarcts. Some restricted diffusion with diminished ADC correlate remains in the area of prior infarct in the posterior right frontal and anterior right parietal cortex, consistent with subacute, evolving infarct. No acute hemorrhage, mass, mass effect, or midline shift. No hydrocephalus or definite extra-axial collection. Redemonstrated remote infarcts in the left corona radiata, bilateral parietal cortex, and posterior right temporal cortex. Vascular: Limited evaluation secondary to motion. Skull and upper cervical spine: Limited evaluation secondary to motion. Sinuses/Orbits: No acute finding. Other: Fluid in bilateral mastoid  air cells. IMPRESSION: 1. Evaluation is limited by motion. Within this limitation, there are scattered foci of restricted diffusion in the bilateral cerebral hemispheres, as well as in the right caudate head and left external capsule, which are new from the prior MRI and likely reflect acute to early subacute infarcts. Given multiple vascular territories, a central embolic etiology is likely. 2. Evolving subacute, evolving infarct in the posterior right frontal and anterior right parietal cortex. These results were called by telephone at the time of interpretation on 04/01/2022 at 11:00 pm to provider MATTHEW TRIFAN , who verbally acknowledged these results. Electronically Signed   By: Merilyn Baba M.D.   On: 04/01/2022 23:00   CT HEAD WO CONTRAST  Result Date: 04/01/2022 CLINICAL DATA:  Neuro deficit, acute, stroke suspected EXAM: CT HEAD WITHOUT CONTRAST TECHNIQUE: Contiguous axial images were obtained from the base of the skull through the vertex without intravenous contrast. RADIATION DOSE REDUCTION: This exam was performed according to the departmental dose-optimization program which includes automated exposure control, adjustment of the mA and/or kV according to patient size and/or use of iterative reconstruction technique. COMPARISON:  CT head 03/06/2022, MRI head 03/29/2021 FINDINGS: Brain: Cerebral ventricle sizes are concordant with the degree of cerebral volume loss. Patchy and confluent areas of decreased attenuation are noted throughout the deep and periventricular white matter of the cerebral hemispheres bilaterally, compatible with chronic microvascular ischemic disease. Left frontoparietal and anterior temporal lobe encephalomalacia. Right temporal lobe encephalomalacia. No evidence of large-territorial acute infarction. No parenchymal hemorrhage. No mass lesion. No extra-axial collection. No mass effect or midline shift. No hydrocephalus. Basilar cisterns are patent. Vascular: No hyperdense  vessel. Atherosclerotic calcifications are present within the cavernous internal carotid arteries. Skull: No acute fracture or focal lesion. Sinuses/Orbits: Paranasal sinuses and mastoid air cells are clear. The orbits are unremarkable. Other: None. IMPRESSION: No acute intracranial abnormality in a patient with chronic bilateral cerebral infarction. Electronically Signed   By: Iven Finn M.D.   On: 04/01/2022 19:11   DISCHARGE EXAMINATION: Vitals:   04/03/22 2352 04/04/22 0315 04/04/22 0750 04/04/22 1156  BP: Marland Kitchen)  95/41 97/68 120/66 112/75  Pulse: 71 (!) 57 (!) 54 (!) 56  Resp: 16 17 18 18  $ Temp: 97.6 F (36.4 C) 97.7 F (36.5 C) 97.8 F (36.6 C) 97.7 F (36.5 C)  TempSrc: Oral Oral Oral Oral  SpO2: 99% 96% 99% 100%  Weight:      Height:       General appearance: alert, cooperative, and no distress Resp: clear to auscultation bilaterally Cardio: regular rate and rhythm, S1, S2 normal, no murmur, click, rub or gallop GI: soft, non-tender; bowel sounds normal; no masses,  no organomegaly  DISPOSITION: home  Discharge Instructions     Ambulatory referral to Neurology   Complete by: As directed    An appointment is requested in approximately: 8 weeks   Call MD for:  difficulty breathing, headache or visual disturbances   Complete by: As directed    Call MD for:  extreme fatigue   Complete by: As directed    Call MD for:  persistant dizziness or light-headedness   Complete by: As directed    Call MD for:  persistant nausea and vomiting   Complete by: As directed    Call MD for:  severe uncontrolled pain   Complete by: As directed    Call MD for:  temperature >100.4   Complete by: As directed    Discharge instructions   Complete by: As directed    Please take your medications as prescribed.  Referral has been sent to neurology for outpatient follow-up.  Cardiology will give instructions after loop recorder has been placed.  You were cared for by a hospitalist during your  hospital stay. If you have any questions about your discharge medications or the care you received while you were in the hospital after you are discharged, you can call the unit and asked to speak with the hospitalist on call if the hospitalist that took care of you is not available. Once you are discharged, your primary care physician will handle any further medical issues. Please note that NO REFILLS for any discharge medications will be authorized once you are discharged, as it is imperative that you return to your primary care physician (or establish a relationship with a primary care physician if you do not have one) for your aftercare needs so that they can reassess your need for medications and monitor your lab values. If you do not have a primary care physician, you can call [phone] for a physician referral.   Increase activity slowly   Complete by: As directed          Allergies as of 04/04/2022   No Known Allergies      Medication List     STOP taking these medications    CINNAMON PO   Co Q 10 [id number] MG Caps   GARLIC PO   GINKOBA PO   LIBREXIA-STROKE milvexian or placebo 25 mg tablet   losartan 50 MG tablet Commonly known as: COZAAR       TAKE these medications    acetaminophen-codeine 300-30 MG tablet Commonly known as: TYLENOL #3 TAKE 1 TABLET EVERY 4 HOURS AS NEEDED FOR MODERATE PAIN What changed: See the new instructions.   albuterol 108 (90 Base) MCG/ACT inhaler Commonly known as: VENTOLIN HFA Inhale 1 puff into the lungs as needed for wheezing or shortness of breath.   ascorbic acid 500 MG tablet Commonly known as: VITAMIN C Take 500 mg by mouth daily.   Aspirin Low Dose 81 MG tablet Generic  drug: aspirin EC Take 1 tablet (81 mg total) by mouth daily. Swallow whole. FOR ONLY ONE MONTH. What changed: additional instructions   atorvastatin 80 MG tablet Commonly known as: LIPITOR TAKE 1 TABLET EVERY DAY   Brilinta 90 MG Tabs tablet Generic drug:  ticagrelor Take 1 tablet (90 mg total) by mouth 2 (two) times daily.   CLEAR EYES OP Place 1-2 drops into both eyes as needed (dry eye).   clopidogrel 75 MG tablet Commonly known as: PLAVIX Take 1 tablet (75 mg total) by mouth daily. TO BE STARTED ON 05/03/22. Start taking on: May 03, 2022 What changed:  additional instructions These instructions start on May 03, 2022. If you are unsure what to do until then, ask your doctor or other care provider.   Fish Oil 1200 MG Cpdr Take 1 capsule by mouth daily.   furosemide 40 MG tablet Commonly known as: LASIX Take 1 tablet (40 mg total) by mouth every other day. What changed:  when to take this reasons to take this   levothyroxine 125 MCG tablet Commonly known as: SYNTHROID Take 1 tablet (125 mcg total) by mouth daily.   metoprolol tartrate 25 MG tablet Commonly known as: LOPRESSOR Take 1/2 tablet (12.5 mg total) by mouth 2 (two) times daily.   minocycline 50 MG capsule Commonly known as: MINOCIN Take 1 capsule (50 mg total) by mouth daily.   pantoprazole 40 MG tablet Commonly known as: PROTONIX Take 1 tablet (40 mg total) by mouth daily.   Potassium Bicarbonate 99 MG Caps Take 99 mg by mouth daily.   rOPINIRole 2 MG tablet Commonly known as: REQUIP Take 2 mg by mouth at bedtime.   Symbicort 160-4.5 MCG/ACT inhaler Generic drug: budesonide-formoterol Inhale 2 puffs into the lungs daily.   vitamin B-12 500 MCG tablet Commonly known as: CYANOCOBALAMIN Take 500 mcg by mouth daily.   zinc gluconate 50 MG tablet Take 50 mg by mouth daily.          Follow-up Information     Garvin Fila, MD Follow up.   Specialties: Neurology, Radiology Contact information: [address] [place] [address] [place]: 35 mins  [facility] Pager on [email or web address]  04/04/2022, 12:05 PM

## 2022-04-04 NOTE — TOC Transition Note (Signed)
Transition of Care [facility]) - CM/SW Discharge Note   Patient Details  Name: [name] MRN: [id number] Date of Birth: 04-20-1945  [facility]) CM/SW Contact:  Pollie Friar, RN Phone Number: 04/04/2022, 4:18 PM   Clinical Narrative:    PCP: Dr Tobie Poet Pt is discharging home. No f/u per PT/OT. No DME needs.  Pt discharging on [place]il[place]. [facility] will give discount with first fill. Pt can not receive monthly assistance with medicare.  Pt has transportation home.   Final next level of care: Home/Self Care Barriers to Discharge: No Barriers Identified   Patient Goals and CMS Choice      Discharge Placement                         Discharge Plan and Services Additional resources added to the After Visit Summary for                                       Social Determinants of Health (SDOH) Interventions SDOH Screenings   Food Insecurity: No Food Insecurity (03/09/2022)  Housing: Low Risk  (03/08/2022)  Transportation Needs: No Transportation Needs (03/09/2022)  Utilities: Not At Risk (03/08/2022)  Alcohol Screen: Low Risk  (12/27/2021)  Depression (PHQ2-9): Low Risk  (12/27/2021)  Financial Resource Strain: Low Risk  (08/26/2021)  Physical Activity: Inactive (08/26/2021)  Social Connections: Moderately Isolated (08/26/2021)  Stress: No Stress Concern Present (08/26/2021)  Tobacco Use: Medium Risk (04/01/2022)     Readmission Risk Interventions     No data to display

## 2022-04-05 ENCOUNTER — Encounter (HOSPITAL_COMMUNITY): Payer: Self-pay | Admitting: Cardiology

## 2022-04-05 ENCOUNTER — Telehealth: Payer: Self-pay | Admitting: *Deleted

## 2022-04-05 NOTE — Transitions of Care (Post Inpatient/ED Visit) (Signed)
04/05/2022  Name: James Richards MRN: [id number] DOB: 12-06-1945  Today's TOC FU Call Status: Today's TOC FU Call Status:: Successful TOC FU Call Competed TOC FU Call Complete Date: 04/05/22  Transition Care Management Follow-up Telephone Call Date of Discharge: 04/04/22 Discharge [facility]: [facility]) Type of Discharge: Inpatient Admission Primary Inpatient Discharge Diagnosis:: Acute embolic CVA; PSVT with loop recorder placement How have you been since you were released from the hospital?: Same Any questions or concerns?: Yes Patient Questions/Concerns:: per caregiver/ Prentiss Bells: "Overall he is doing good; last night after he took the new blood thinner, he had A LOT of bleeding-- it took about 10 minutes of holding pressure on the wound, but it eventually stopped bleeding.  Also-- I hope that remote monitoring machine they gave me is working right.  I just can't tell if it is or not.  He sometimes gets very upset and sad after all he has been through.... I will talk to dr. Tobie Poet about it when I take him to see her again" Patient Questions/Concerns Addressed: Other: (see interventions/ care coordination activities below)  Items Reviewed: Did you receive and understand the discharge instructions provided?: Yes (thoroughly reviewed with caregiver today) Medications obtained and verified?: Yes (Medications Reviewed) (Full medication review completed; no concerns or discprenacies identified; caregiver manages medications, verifies has obtained/ is taking all newly Rx's meds after hospital discharge; denies questions/ concerns around medications) Any new allergies since your discharge?: No Dietary orders reviewed?: Yes Type of Diet Ordered:: heart healthy, low salt Do you have support at home?: Yes People in Home: other relative(s) Name of Support/Comfort Primary Source: Prentiss Bells- caregiver of 26 years; resides with patient and assists with all care activities/ needs  Home Care and  Equipment/Supplies: [place] Ordered?: No Any new equipment or medical supplies ordered?: No  Functional Questionnaire: Do you need assistance with bathing/showering or dressing?: No Do you need assistance with meal preparation?: Yes Do you need assistance with eating?: No Do you have difficulty maintaining continence: No Do you need assistance with getting out of bed/getting out of a chair/moving?: No Do you have difficulty managing or taking your medications?: Yes (caregiver manages all aspects of medication administration)  Folllow up appointments reviewed: PCP Follow-up appointment confirmed?: No (sent request to [facility] clinical pool to facilitate scheduling of post- hospital follow up office visit with PCP, as per PCP request) MD Provider Line Number:[phone] Given: No [facility] Follow-up appointment confirmed?: Yes Date of Specialist follow-up appointment?: 04/19/22 Follow-Up Specialty Provider:: neurology Do you need transportation to your follow-up appointment?: No Do you understand care options if your condition(s) worsen?: Yes-patient verbalized understanding  SDOH Interventions Today    Flowsheet Row Most Recent Value  SDOH Interventions   Food Insecurity Interventions Intervention Not Indicated  Transportation Interventions Intervention Not Indicated  [caregiver provides all transportation]  Depression Interventions/Treatment  --  [facility] Go PCP aware of caregiver concerns around possible depression/ need for assessment/ medication]      TOC Interventions Today    Flowsheet Row Most Recent Value  TOC Interventions   TOC Interventions Discussed/Reviewed TOC Interventions Discussed  [sent request to PCP clinical pool as requested by PCP to facilitate scheduling of hospital follow up PCP appointment]      Interventions Today    Flowsheet Row Most Recent Value  Chronic Disease   Chronic disease during today's visit Other  [CVA/ PSVT with  loop recorder placement]  General Interventions   General Interventions Discussed/Reviewed General Interventions Discussed, Doctor  Visits, Referral to Nurse  Doctor Visits Discussed/Reviewed Doctor Visits Discussed, PCP, Specialist  PCP/Specialist Visits Compliance with follow-up visit  Education Interventions   Education Provided Provided Education  Provided Verbal Education On When to see the doctor, Medication, Other  [need to report and discuss signs/ symptoms depression with PCP,  need to follow up with cardiology team post- recent loop recorder placement]  [place] Discussed/Reviewed Mental Health Discussed, Depression  [depression screening completed]  Nutrition Interventions   Nutrition Discussed/Reviewed Nutrition Discussed, Decreasing salt  Pharmacy Interventions   Pharmacy Dicussed/Reviewed Pharmacy Topics Discussed, Medications and their functions  [Full medication review completed]      Oneta Rack, RN, BSN, CCRN Alumnus RN CM Care Coordination/ Transition of [place] Management [phone]: direct office

## 2022-04-09 ENCOUNTER — Inpatient Hospital Stay (HOSPITAL_COMMUNITY): Payer: Medicare PPO

## 2022-04-09 ENCOUNTER — Other Ambulatory Visit: Payer: Self-pay

## 2022-04-09 ENCOUNTER — Emergency Department (HOSPITAL_COMMUNITY): Payer: Medicare PPO

## 2022-04-09 ENCOUNTER — Inpatient Hospital Stay (HOSPITAL_COMMUNITY)
Admission: EM | Admit: 2022-04-09 | Discharge: 2022-04-15 | DRG: 064 | Disposition: A | Discharge status: Hospice | Payer: Medicare PPO | Attending: Internal Medicine | Admitting: Internal Medicine

## 2022-04-09 ENCOUNTER — Encounter (HOSPITAL_COMMUNITY): Payer: Self-pay | Admitting: Neurology

## 2022-04-09 DIAGNOSIS — I2510 Atherosclerotic heart disease of native coronary artery without angina pectoris: Secondary | ICD-10-CM | POA: Diagnosis present

## 2022-04-09 DIAGNOSIS — Z7189 Other specified counseling: Secondary | ICD-10-CM | POA: Diagnosis not present

## 2022-04-09 DIAGNOSIS — Z79899 Other long term (current) drug therapy: Secondary | ICD-10-CM | POA: Diagnosis not present

## 2022-04-09 DIAGNOSIS — R451 Restlessness and agitation: Secondary | ICD-10-CM | POA: Diagnosis not present

## 2022-04-09 DIAGNOSIS — E782 Mixed hyperlipidemia: Secondary | ICD-10-CM | POA: Diagnosis present

## 2022-04-09 DIAGNOSIS — G8194 Hemiplegia, unspecified affecting left nondominant side: Secondary | ICD-10-CM | POA: Diagnosis present

## 2022-04-09 DIAGNOSIS — J449 Chronic obstructive pulmonary disease, unspecified: Secondary | ICD-10-CM | POA: Diagnosis present

## 2022-04-09 DIAGNOSIS — R4182 Altered mental status, unspecified: Secondary | ICD-10-CM | POA: Diagnosis not present

## 2022-04-09 DIAGNOSIS — I70219 Atherosclerosis of native arteries of extremities with intermittent claudication, unspecified extremity: Secondary | ICD-10-CM | POA: Diagnosis present

## 2022-04-09 DIAGNOSIS — G2581 Restless legs syndrome: Secondary | ICD-10-CM | POA: Diagnosis present

## 2022-04-09 DIAGNOSIS — H547 Unspecified visual loss: Secondary | ICD-10-CM | POA: Diagnosis present

## 2022-04-09 DIAGNOSIS — Z809 Family history of malignant neoplasm, unspecified: Secondary | ICD-10-CM | POA: Diagnosis not present

## 2022-04-09 DIAGNOSIS — R29717 NIHSS score 17: Secondary | ICD-10-CM | POA: Diagnosis present

## 2022-04-09 DIAGNOSIS — I6522 Occlusion and stenosis of left carotid artery: Secondary | ICD-10-CM | POA: Diagnosis present

## 2022-04-09 DIAGNOSIS — Z5320 Procedure and treatment not carried out because of patient's decision for unspecified reasons: Secondary | ICD-10-CM | POA: Diagnosis present

## 2022-04-09 DIAGNOSIS — R1310 Dysphagia, unspecified: Secondary | ICD-10-CM | POA: Diagnosis present

## 2022-04-09 DIAGNOSIS — Z66 Do not resuscitate: Secondary | ICD-10-CM | POA: Diagnosis present

## 2022-04-09 DIAGNOSIS — I5032 Chronic diastolic (congestive) heart failure: Secondary | ICD-10-CM | POA: Diagnosis present

## 2022-04-09 DIAGNOSIS — I110 Hypertensive heart disease with heart failure: Secondary | ICD-10-CM | POA: Diagnosis present

## 2022-04-09 DIAGNOSIS — Z85810 Personal history of malignant neoplasm of tongue: Secondary | ICD-10-CM

## 2022-04-09 DIAGNOSIS — Z7901 Long term (current) use of anticoagulants: Secondary | ICD-10-CM

## 2022-04-09 DIAGNOSIS — Z79890 Hormone replacement therapy (postmenopausal): Secondary | ICD-10-CM | POA: Diagnosis not present

## 2022-04-09 DIAGNOSIS — Z7982 Long term (current) use of aspirin: Secondary | ICD-10-CM | POA: Diagnosis not present

## 2022-04-09 DIAGNOSIS — R29810 Facial weakness: Secondary | ICD-10-CM | POA: Diagnosis present

## 2022-04-09 DIAGNOSIS — E038 Other specified hypothyroidism: Secondary | ICD-10-CM | POA: Diagnosis present

## 2022-04-09 DIAGNOSIS — J8410 Pulmonary fibrosis, unspecified: Secondary | ICD-10-CM | POA: Diagnosis present

## 2022-04-09 DIAGNOSIS — N179 Acute kidney failure, unspecified: Secondary | ICD-10-CM | POA: Diagnosis present

## 2022-04-09 DIAGNOSIS — I4719 Other supraventricular tachycardia: Secondary | ICD-10-CM | POA: Diagnosis present

## 2022-04-09 DIAGNOSIS — I6340 Cerebral infarction due to embolism of unspecified cerebral artery: Principal | ICD-10-CM | POA: Diagnosis present

## 2022-04-09 DIAGNOSIS — H6123 Impacted cerumen, bilateral: Secondary | ICD-10-CM | POA: Diagnosis present

## 2022-04-09 DIAGNOSIS — Z7951 Long term (current) use of inhaled steroids: Secondary | ICD-10-CM

## 2022-04-09 DIAGNOSIS — Z8673 Personal history of transient ischemic attack (TIA), and cerebral infarction without residual deficits: Secondary | ICD-10-CM | POA: Diagnosis not present

## 2022-04-09 DIAGNOSIS — G9341 Metabolic encephalopathy: Secondary | ICD-10-CM | POA: Diagnosis present

## 2022-04-09 DIAGNOSIS — Z8249 Family history of ischemic heart disease and other diseases of the circulatory system: Secondary | ICD-10-CM | POA: Diagnosis not present

## 2022-04-09 DIAGNOSIS — R0902 Hypoxemia: Secondary | ICD-10-CM | POA: Diagnosis present

## 2022-04-09 DIAGNOSIS — Z923 Personal history of irradiation: Secondary | ICD-10-CM

## 2022-04-09 DIAGNOSIS — R471 Dysarthria and anarthria: Secondary | ICD-10-CM | POA: Diagnosis present

## 2022-04-09 DIAGNOSIS — I609 Nontraumatic subarachnoid hemorrhage, unspecified: Secondary | ICD-10-CM | POA: Diagnosis not present

## 2022-04-09 DIAGNOSIS — R414 Neurologic neglect syndrome: Secondary | ICD-10-CM | POA: Diagnosis present

## 2022-04-09 DIAGNOSIS — Z515 Encounter for palliative care: Secondary | ICD-10-CM | POA: Diagnosis not present

## 2022-04-09 DIAGNOSIS — E785 Hyperlipidemia, unspecified: Secondary | ICD-10-CM | POA: Diagnosis present

## 2022-04-09 DIAGNOSIS — Z8521 Personal history of malignant neoplasm of larynx: Secondary | ICD-10-CM

## 2022-04-09 DIAGNOSIS — I6529 Occlusion and stenosis of unspecified carotid artery: Secondary | ICD-10-CM | POA: Diagnosis present

## 2022-04-09 DIAGNOSIS — R4189 Other symptoms and signs involving cognitive functions and awareness: Secondary | ICD-10-CM | POA: Diagnosis present

## 2022-04-09 DIAGNOSIS — Z87891 Personal history of nicotine dependence: Secondary | ICD-10-CM

## 2022-04-09 DIAGNOSIS — Z9221 Personal history of antineoplastic chemotherapy: Secondary | ICD-10-CM

## 2022-04-09 DIAGNOSIS — I639 Cerebral infarction, unspecified: Secondary | ICD-10-CM | POA: Diagnosis not present

## 2022-04-09 LAB — CBC
HCT: 40.1 % (ref 39.0–52.0)
Hemoglobin: 13.1 g/dL (ref 13.0–17.0)
MCH: 31.6 pg (ref 26.0–34.0)
MCHC: 32.7 g/dL (ref 30.0–36.0)
MCV: 96.6 fL (ref 80.0–100.0)
Platelets: 263 10*3/uL (ref 150–400)
RBC: 4.15 MIL/uL — ABNORMAL LOW (ref 4.22–5.81)
RDW: 14.9 % (ref 11.5–15.5)
WBC: 6.1 10*3/uL (ref 4.0–10.5)
nRBC: 0 % (ref 0.0–0.2)

## 2022-04-09 LAB — PROTIME-INR
INR: 1 (ref 0.8–1.2)
Prothrombin Time: 13.3 seconds (ref 11.4–15.2)

## 2022-04-09 LAB — COMPREHENSIVE METABOLIC PANEL
ALT: 41 U/L (ref 0–44)
AST: 36 U/L (ref 15–41)
Albumin: 3.3 g/dL — ABNORMAL LOW (ref 3.5–5.0)
Alkaline Phosphatase: 77 U/L (ref 38–126)
Anion gap: 6 (ref 5–15)
BUN: 20 mg/dL (ref 8–23)
CO2: 30 mmol/L (ref 22–32)
Calcium: 8.7 mg/dL — ABNORMAL LOW (ref 8.9–10.3)
Chloride: 101 mmol/L (ref 98–111)
Creatinine, Ser: 1.26 mg/dL — ABNORMAL HIGH (ref 0.61–1.24)
GFR, Estimated: 59 mL/min — ABNORMAL LOW (ref >60)
Glucose, Bld: 95 mg/dL (ref 70–99)
Potassium: 4.5 mmol/L (ref 3.5–5.1)
Sodium: 137 mmol/L (ref 135–145)
Total Bilirubin: 1.3 mg/dL — ABNORMAL HIGH (ref 0.3–1.2)
Total Protein: 5.9 g/dL — ABNORMAL LOW (ref 6.5–8.1)

## 2022-04-09 LAB — I-STAT CHEM 8, ED
BUN: 28 mg/dL — ABNORMAL HIGH (ref 8–23)
Calcium, Ion: 1.18 mmol/L (ref 1.15–1.40)
Chloride: 101 mmol/L (ref 98–111)
Creatinine, Ser: 1.2 mg/dL (ref 0.61–1.24)
Glucose, Bld: 93 mg/dL (ref 70–99)
HCT: 41 % (ref 39.0–52.0)
Hemoglobin: 13.9 g/dL (ref 13.0–17.0)
Potassium: 4.6 mmol/L (ref 3.5–5.1)
Sodium: 141 mmol/L (ref 135–145)
TCO2: 31 mmol/L (ref 22–32)

## 2022-04-09 LAB — DIFFERENTIAL
Abs Immature Granulocytes: 0.01 10*3/uL (ref 0.00–0.07)
Basophils Absolute: 0 10*3/uL (ref 0.0–0.1)
Basophils Relative: 1 %
Eosinophils Absolute: 0.3 10*3/uL (ref 0.0–0.5)
Eosinophils Relative: 5 %
Immature Granulocytes: 0 %
Lymphocytes Relative: 11 %
Lymphs Abs: 0.7 10*3/uL (ref 0.7–4.0)
Monocytes Absolute: 0.6 10*3/uL (ref 0.1–1.0)
Monocytes Relative: 10 %
Neutro Abs: 4.5 10*3/uL (ref 1.7–7.7)
Neutrophils Relative %: 73 %

## 2022-04-09 LAB — ETHANOL: Alcohol, Ethyl (B): <10 mg/dL (ref <10)

## 2022-04-09 LAB — CBG MONITORING, ED
Glucose-Capillary: 94 mg/dL (ref 70–99)
Glucose-Capillary: 97 mg/dL (ref 70–99)

## 2022-04-09 LAB — APTT: aPTT: 24 seconds (ref 24–36)

## 2022-04-09 LAB — MRSA NEXT GEN BY PCR, NASAL: MRSA by PCR Next Gen: NOT DETECTED

## 2022-04-09 MED ORDER — SODIUM CHLORIDE 0.9 % IV SOLN: 2000.0000 mg | Freq: Once | INTRAVENOUS | Status: DC

## 2022-04-09 MED ORDER — ORAL CARE MOUTH RINSE: 15.0000 mL | OROMUCOSAL | Status: DC | PRN

## 2022-04-09 MED ORDER — SODIUM CHLORIDE 0.9% FLUSH
3.0000 mL | Freq: Once | INTRAVENOUS | Status: AC
  Administered 2022-04-09: 3 mL via INTRAVENOUS

## 2022-04-09 MED ORDER — LORAZEPAM 2 MG/ML IJ SOLN: 2.0000 mg | Freq: Once | INTRAMUSCULAR | Status: AC

## 2022-04-09 MED ORDER — ORAL CARE MOUTH RINSE
15.0000 mL | OROMUCOSAL | Status: DC
  Administered 2022-04-09 – 2022-04-15 (×19): 15 mL via OROMUCOSAL

## 2022-04-09 MED ORDER — SODIUM CHLORIDE 0.9 % IV SOLN
250.0000 mg | Freq: Two times a day (BID) | INTRAVENOUS | Status: DC
  Administered 2022-04-09 – 2022-04-10 (×3): 250 mg via INTRAVENOUS
  Filled 2022-04-09 (×5): qty 2.5

## 2022-04-09 MED ORDER — LORAZEPAM 2 MG/ML IJ SOLN
INTRAMUSCULAR | Status: AC
  Administered 2022-04-09: 2 mg via INTRAVENOUS
  Filled 2022-04-09: qty 1

## 2022-04-09 MED ORDER — LEVOTHYROXINE SODIUM 25 MCG PO TABS
125.0000 ug | ORAL_TABLET | Freq: Every day | ORAL | Status: DC
  Administered 2022-04-12 – 2022-04-15 (×4): 125 ug via ORAL
  Filled 2022-04-09 (×4): qty 1

## 2022-04-09 MED ORDER — POLYVINYL ALCOHOL 1.4 % OP SOLN: 1.0000 [drp] | OPHTHALMIC | Status: DC | PRN

## 2022-04-09 MED ORDER — SODIUM CHLORIDE 0.9 % IV SOLN
2000.0000 mg | Freq: Once | INTRAVENOUS | Status: AC
  Administered 2022-04-09: 2000 mg via INTRAVENOUS
  Filled 2022-04-09: qty 20

## 2022-04-09 MED ORDER — SODIUM CHLORIDE 0.9 % IV SOLN: INTRAVENOUS | Status: DC | PRN

## 2022-04-09 MED ORDER — FLUTICASONE FUROATE-VILANTEROL 200-25 MCG/ACT IN AEPB
1.0000 | INHALATION_SPRAY | Freq: Every day | RESPIRATORY_TRACT | Status: DC
  Administered 2022-04-14 – 2022-04-15 (×2): 1 via RESPIRATORY_TRACT
  Filled 2022-04-09: qty 28

## 2022-04-09 MED ORDER — ATORVASTATIN CALCIUM 80 MG PO TABS
80.0000 mg | ORAL_TABLET | Freq: Every day | ORAL | Status: DC
  Administered 2022-04-12 – 2022-04-14 (×3): 80 mg via ORAL
  Filled 2022-04-09 (×3): qty 1

## 2022-04-09 MED ORDER — CYANOCOBALAMIN 500 MCG PO TABS
500.0000 ug | ORAL_TABLET | Freq: Every day | ORAL | Status: DC
  Administered 2022-04-12 – 2022-04-14 (×3): 500 ug via ORAL
  Filled 2022-04-09 (×5): qty 1

## 2022-04-09 MED ORDER — IOHEXOL 350 MG/ML SOLN
75.0000 mL | Freq: Once | INTRAVENOUS | Status: AC | PRN
  Administered 2022-04-09: 75 mL via INTRAVENOUS

## 2022-04-09 MED ORDER — STROKE: EARLY STAGES OF RECOVERY BOOK
Freq: Once | Status: AC
  Filled 2022-04-09: qty 1

## 2022-04-09 MED ORDER — ACETAMINOPHEN 325 MG PO TABS: 650.0000 mg | ORAL_TABLET | ORAL | Status: DC | PRN

## 2022-04-09 MED ORDER — LORAZEPAM 2 MG/ML IJ SOLN: 2.0000 mg | Freq: Once | INTRAMUSCULAR | Status: DC

## 2022-04-09 MED ORDER — METOPROLOL TARTRATE 12.5 MG HALF TABLET
12.5000 mg | ORAL_TABLET | Freq: Two times a day (BID) | ORAL | Status: DC
  Administered 2022-04-11 – 2022-04-15 (×7): 12.5 mg via ORAL
  Filled 2022-04-09 (×7): qty 1

## 2022-04-09 MED ORDER — CLEVIDIPINE BUTYRATE 0.5 MG/ML IV EMUL
0.0000 mg/h | INTRAVENOUS | Status: DC
  Administered 2022-04-09: 2 mg/h via INTRAVENOUS
  Filled 2022-04-09: qty 100

## 2022-04-09 MED ORDER — ACETAMINOPHEN 160 MG/5ML PO SOLN: 650.0000 mg | ORAL | Status: DC | PRN

## 2022-04-09 MED ORDER — CHLORHEXIDINE GLUCONATE CLOTH 2 % EX PADS
6.0000 | MEDICATED_PAD | Freq: Every day | CUTANEOUS | Status: DC
  Administered 2022-04-10 – 2022-04-12 (×3): 6 via TOPICAL

## 2022-04-09 MED ORDER — ROPINIROLE HCL 1 MG PO TABS
2.0000 mg | ORAL_TABLET | Freq: Every day | ORAL | Status: DC
  Administered 2022-04-11 – 2022-04-14 (×3): 2 mg via ORAL
  Filled 2022-04-09 (×6): qty 2

## 2022-04-09 MED ORDER — SENNOSIDES-DOCUSATE SODIUM 8.6-50 MG PO TABS
1.0000 | ORAL_TABLET | Freq: Two times a day (BID) | ORAL | Status: DC
  Administered 2022-04-11 – 2022-04-15 (×7): 1 via ORAL
  Filled 2022-04-09 (×7): qty 1

## 2022-04-09 MED ORDER — ACETAMINOPHEN 650 MG RE SUPP: 650.0000 mg | RECTAL | Status: DC | PRN

## 2022-04-09 MED ORDER — PANTOPRAZOLE SODIUM 40 MG IV SOLR
40.0000 mg | Freq: Every day | INTRAVENOUS | Status: DC
  Administered 2022-04-09 – 2022-04-11 (×3): 40 mg via INTRAVENOUS
  Filled 2022-04-09 (×3): qty 10

## 2022-04-09 MED ORDER — IPRATROPIUM-ALBUTEROL 0.5-2.5 (3) MG/3ML IN SOLN: 3.0000 mL | RESPIRATORY_TRACT | Status: DC | PRN

## 2022-04-09 MED ORDER — LEVETIRACETAM IN NACL 500 MG/100ML IV SOLN: 500.0000 mg | Freq: Two times a day (BID) | INTRAVENOUS | Status: DC

## 2022-04-09 NOTE — Progress Notes (Signed)
Clarified BP parameters for cleviprex gtt with Dr. Curly Shores. Oders had listed for SBP<120, but after clarification MD changed order to reflect goal SBP130-150.

## 2022-04-09 NOTE — ED Provider Notes (Signed)
[place] EMERGENCY DEPARTMENT AT [facility] Provider Note   CSN: [id number] Arrival date & time: 04/09/22  1201  An emergency department physician performed an initial assessment on this suspected stroke patient at 1202.  History  Chief Complaint  Patient presents with   Code Stroke    James Richards is a 77 y.o. male.  HPI   Patient presented to the ED as a code stroke evaluation for altered mental status.  Patient was recently admitted to the hospital on February 16 and discharged 5 days ago on February 19.  At that time patient was admitted for an acute embolic stroke.  Patient is not able to provide any history at this time.  Per report, pt was complaining of headache most of the day.  Pt was given something to drink. Caregiver returned and noticed pt was slobbering and possibly spilled drink on himself.  EMS was called. Patient seen by stroke team on arrival. Home Medications Prior to Admission medications   Medication Sig Start Date End Date Taking? Authorizing Provider  acetaminophen-codeine (TYLENOL #3) 300-30 MG tablet TAKE 1 TABLET EVERY 4 HOURS AS NEEDED FOR MODERATE PAIN Patient taking differently: Take 1 tablet by mouth as needed for moderate pain or severe pain (headache). 07/07/21   Janie Morning, NP  albuterol (VENTOLIN HFA) 108 (90 Base) MCG/ACT inhaler Inhale 1 puff into the lungs as needed for wheezing or shortness of breath. 07/20/21   [provider]  ascorbic acid (VITAMIN C) 500 MG tablet Take 500 mg by mouth daily.    [provider]  aspirin EC 81 MG tablet Take 1 tablet (81 mg total) by mouth daily. Swallow whole. FOR ONLY ONE MONTH. 04/04/22   Osvaldo Shipper, MD  atorvastatin (LIPITOR) 80 MG tablet TAKE 1 TABLET EVERY DAY Patient taking differently: Take 80 mg by mouth daily. 02/16/22   Cox, Fritzi Mandes, MD  clopidogrel (PLAVIX) 75 MG tablet Take 1 tablet (75 mg total) by mouth daily. TO BE STARTED ON 05/03/22. Patient not taking:  Reported on 04/05/2022 05/03/22   Osvaldo Shipper, MD  furosemide (LASIX) 40 MG tablet Take 1 tablet (40 mg total) by mouth every other day. Patient taking differently: Take 40 mg by mouth daily as needed for fluid or edema. 04/22/21   Abigail Miyamoto, MD  levothyroxine (SYNTHROID) 125 MCG tablet Take 1 tablet (125 mcg total) by mouth daily. 03/03/22   Janie Morning, NP  metoprolol tartrate (LOPRESSOR) 25 MG tablet Take 1/2 tablet (12.5 mg total) by mouth 2 (two) times daily. 04/04/22   Osvaldo Shipper, MD  minocycline (MINOCIN) 50 MG capsule Take 1 capsule (50 mg total) by mouth daily. 09/28/21   Abigail Miyamoto, MD  Naphazoline HCl (CLEAR EYES OP) Place 1-2 drops into both eyes as needed (dry eye).    [provider]  Omega-3 Fatty Acids (FISH OIL) 1200 MG CPDR Take 1 capsule by mouth daily.    [provider]  pantoprazole (PROTONIX) 40 MG tablet Take 1 tablet (40 mg total) by mouth daily. 03/08/22   Marvel Plan, MD  Potassium Bicarbonate 99 MG CAPS Take 99 mg by mouth daily.    [provider]  rOPINIRole (REQUIP) 2 MG tablet Take 2 mg by mouth at bedtime. 02/22/22   [provider]  SYMBICORT 160-4.5 MCG/ACT inhaler Inhale 2 puffs into the lungs daily. 12/21/21   [provider]  ticagrelor (BRILINTA) 90 MG TABS tablet Take 1 tablet (90 mg total) by  mouth 2 (two) times daily. 04/04/22 05/04/22  Osvaldo Shipper, MD  vitamin B-12 (CYANOCOBALAMIN) 500 MCG tablet Take 500 mcg by mouth daily.    [provider]  zinc gluconate 50 MG tablet Take 50 mg by mouth daily.    [provider]      Allergies    Patient has no known allergies.    Review of Systems   Review of Systems  Physical Exam Updated Vital Signs BP (!) 144/65   Pulse 63   Temp 98.6 F (37 C) (Axillary)   Resp 17   Ht 1.651 m (5\' 5" )   Wt 79.4 kg   SpO2 97%   BMI 29.13 kg/m  Physical Exam Vitals and nursing note reviewed.  Constitutional:       Appearance: He is well-developed. He is ill-appearing.  HENT:     Head: Normocephalic and atraumatic.     Right Ear: External ear normal.     Left Ear: External ear normal.  Eyes:     General: No scleral icterus.       Right eye: No discharge.        Left eye: No discharge.     Conjunctiva/sclera: Conjunctivae normal.  Neck:     Trachea: No tracheal deviation.  Cardiovascular:     Rate and Rhythm: Normal rate.  Pulmonary:     Effort: Pulmonary effort is normal. No respiratory distress.     Breath sounds: No stridor.  Abdominal:     General: There is no distension.  Musculoskeletal:        General: No swelling or deformity.     Cervical back: Neck supple.  Skin:    General: Skin is warm and dry.     Findings: No rash.  Neurological:     Mental Status: He is unresponsive.     Motor: No seizure activity.     ED Results / Procedures / Treatments   Labs (all labs ordered are listed, but only abnormal results are displayed) Labs Reviewed  CBC - Abnormal; Notable for the following components:      Result Value   RBC 4.15 (*)    All other components within normal limits  COMPREHENSIVE METABOLIC PANEL - Abnormal; Notable for the following components:   Creatinine, Ser 1.26 (*)    Calcium 8.7 (*)    Total Protein 5.9 (*)    Albumin 3.3 (*)    Total Bilirubin 1.3 (*)    GFR, Estimated 59 (*)    All other components within normal limits  I-STAT CHEM 8, ED - Abnormal; Notable for the following components:   BUN 28 (*)    All other components within normal limits  MRSA NEXT GEN BY PCR, NASAL  PROTIME-INR  APTT  DIFFERENTIAL  ETHANOL  CBG MONITORING, ED  CBG MONITORING, ED    EKG None  Radiology CT HEAD WO CONTRAST ( )  Result Date: 04/09/2022 CLINICAL DATA:  Subarachnoid hemorrhage EXAM: CT HEAD WITHOUT CONTRAST TECHNIQUE: Contiguous axial images were obtained from the base of the skull through the vertex without intravenous contrast. RADIATION DOSE REDUCTION:  This exam was performed according to the departmental dose-optimization program which includes automated exposure control, adjustment of the mA and/or kV according to patient size and/or use of iterative reconstruction technique. COMPARISON:  None Available. FINDINGS: Brain: There is no mass, hemorrhage or extra-axial collection. There is generalized atrophy without lobar predilection. Hypodensity of the white matter is most commonly associated with chronic microvascular disease. Old infarcts  of both temporal lobes. Vascular: Atherosclerotic calcification of the internal carotid arteries at the skull base. No abnormal hyperdensity of the major intracranial arteries or dural venous sinuses. Skull: The visualized skull base, calvarium and extracranial soft tissues are normal. Sinuses/Orbits: No fluid levels or advanced mucosal thickening of the visualized paranasal sinuses. No mastoid or middle ear effusion. The orbits are normal. IMPRESSION: 1. No acute intracranial abnormality. 2. Old infarcts of both temporal lobes and findings of chronic microvascular disease. Electronically Signed   By: Deatra Robinson M.D.   On: 04/09/2022 23:16   Chest Port 1 View  Result Date: 04/09/2022 CLINICAL DATA:  Subarachnoid hemorrhage. EXAM: PORTABLE CHEST 1 VIEW COMPARISON:  AP chest 03/29/2021, chest two views 12/02/2019 FINDINGS: Single frontal view of the chest. Low lung volumes. There is technical magnification of the cardiac silhouette which does appear enlarged. Mediastinal contours are not well evaluated due to lung volumes and technique. Mild chronic bilateral interstitial thickening. No definite focal airspace opacity. No large pleural effusion. No pneumothorax. No acute skeletal abnormality. IMPRESSION: 1. Limited evaluation due to low lung volumes, patient rotation, and single AP view. 2. No definite acute cardiopulmonary disease. Electronically Signed   By: Neita Garnet M.D.   On: 04/09/2022 13:30   CT ANGIO HEAD  NECK W WO CM (CODE STROKE)  Result Date: 04/09/2022 CLINICAL DATA:  77 year old male code stroke presentation. History of occluded left ICA. EXAM: CT ANGIOGRAPHY HEAD AND NECK TECHNIQUE: Multidetector CT imaging of the head and neck was performed using the standard protocol during bolus administration of intravenous contrast. Multiplanar CT image reconstructions and MIPs were obtained to evaluate the vascular anatomy. Carotid stenosis measurements (when applicable) are obtained utilizing NASCET criteria, using the distal internal carotid diameter as the denominator. RADIATION DOSE REDUCTION: This exam was performed according to the departmental dose-optimization program which includes automated exposure control, adjustment of the mA and/or kV according to patient size and/or use of iterative reconstruction technique. CONTRAST:  75mL OMNIPAQUE IOHEXOL 350 MG/ML SOLN COMPARISON:  Recent CTA head and neck 04/02/2022. Plain head CT earlier today. FINDINGS: CTA NECK Skeleton: Absent dentition. No acute osseous abnormality identified. Upper chest: No acute finding. Other neck: No acute finding. Aortic arch: Stable aortic arch, 4 vessel configuration with separate origin of the left vertebral. Mild Calcified aortic atherosclerosis. Right carotid system: Tortuous brachiocephalic artery and right CCA origin with mild plaque and no significant stenosis. Possible previous right carotid endarterectomy. Stable right ICA bulb plaque and irregularity with no hemodynamically significant stenosis. Stable tortuosity of the right ICA distal to the bulb. Left carotid system: Chronically occluded left ICA origin without reconstitution to the skull base. Vertebral arteries: Proximal right subclavian artery tortuosity and calcified plaque with up to 50% stenosis appears stable. The right vertebral artery origin is chronically occluded, with no reconstitution to the skull base. Left vertebral artery is patent and arises directly from the  arch. The left vertebral is patent to the skull base, mildly diminutive but without significant plaque or stenosis. CTA HEAD Posterior circulation: Chronically occluded distal right vertebral artery, the left supplies the basilar. Left PICA origin remains patent. Patent basilar artery. Patent right AICA origin. Fetal type bilateral PCA origins and diminutive basilar tip appear unchanged. SCA origins remain patent. Bilateral PCA branches are stable with mild irregularity. Anterior circulation: Right ICA siphon is patent with moderate calcified plaque but only mild siphon stenosis. Normal right posterior communicating artery origin. Patent right carotid terminus. Patent right MCA and ACA origins. Normal anterior  communicating artery. Patent left ICA terminus, left ACA and MCA origins, and left posterior communicating artery which appears stable. Mild left MCA origin irregularity and stenosis again noted. Bilateral ACA branches are stable and within normal limits. Right MCA M1 segment and bifurcation are patent without stenosis. Left MCA M1 segment and trifurcation remains patent. Bilateral MCA branches are stable. Chronic right M3/M4 calcified plaque again noted. Venous sinuses: Early contrast timing, not well evaluated. Anatomic variants: Fetal type PCA origins. Left vertebral artery arises directly from the arch. Review of the MIP images confirms the above findings IMPRESSION: 1. Negative for emergent large vessel occlusion. 2. Stable CTA demonstrating chronically occluded Left ICA and Right Vertebral Artery. Left ICA terminus and fetal type Left PCA origin are reconstituted, probably via the [employer]. 3. Stable right carotid atherosclerosis without hemodynamically significant stenosis. Left vertebral artery supplies the basilar and arises directly from the aortic arch. Electronically Signed   By: Odessa Fleming M.D.   On: 04/09/2022 12:50   CT HEAD CODE STROKE WO CONTRAST  Result Date: 04/09/2022 CLINICAL DATA:  Code  stroke. Stroke suspected. No additional history provided. EXAM: CT HEAD WITHOUT CONTRAST TECHNIQUE: Contiguous axial images were obtained from the base of the skull through the vertex without intravenous contrast. RADIATION DOSE REDUCTION: This exam was performed according to the departmental dose-optimization program which includes automated exposure control, adjustment of the mA and/or kV according to patient size and/or use of iterative reconstruction technique. COMPARISON:  CT Head 04/01/22, MR Head 04/01/22 FINDINGS: Brain: Redemonstrated are chronic appearing infarcts in the bilateral MCA territory, unchanged prior exam. There is no definite CT evidence of a new cortical infarct. Compared to prior exam there are multiple new cortical calcific densities, for example in the right frontal lobe (series 2, image 22, 20), left parietal lobe (series 2, image 19, 18). These calcific densities are in regions of infarcts seen on prior MRI brain dated 04/01/2022. There is also new subarachnoid hemorrhage along the right occipital lobe (series 6, image 18), which is new compared to prior brain MRI. No hydrocephalus. Vascular: Atherosclerotic calcifications of the carotid siphons bilaterally. No disproportionately hyperdense vessel is visualized. Skull: Normal. Negative for fracture or focal lesion. Sinuses/Orbits: Bilateral lens replacement. No middle ear or mastoid effusion. There is abnormal soft tissue in the right EAC abutting the tympanic membrane, which could represent a cholesteatoma. This has increased compared to 2017. Recommend correlation with direct visualization. Other: None. IMPRESSION: 1. New subarachnoid hemorrhage along the right occipital lobe. 2. Multiple cortical calcific densities in the right frontal lobe, left parietal lobe, and right occipital lobe, which are in regions of infarcts seen on prior MRI brain dated 04/01/2022. 3. No definite CT evidence of a new cortical infarct. 4. Abnormal soft tissue  in the right EAC abutting the tympanic membrane, which could represent a cholesteatoma. This has increased compared to 2017. Recommend correlation with direct visualization. Findings were paged to Dr. Iver Nestle on 04/09/22 at 12:22 PM via [facility] paging system. Electronically Signed   By: Lorenza Cambridge M.D.   On: 04/09/2022 12:22    Procedures Procedures    Medications Ordered in ED Medications  clevidipine (CLEVIPREX) infusion 0.5 mg/mL (0 mg/hr Intravenous Stopped 04/09/22 2318)   stroke: early stages of recovery book (has no administration in time range)  acetaminophen (TYLENOL) tablet 650 mg (has no administration in time range)    Or  acetaminophen (TYLENOL) 160 MG/5ML solution 650 mg (has no administration in time range)    Or  acetaminophen (TYLENOL) suppository 650 mg (has no administration in time range)  senna-docusate (Senokot-S) tablet 1 tablet (1 tablet Oral Not Given 04/09/22 2132)  pantoprazole (PROTONIX) injection 40 mg (40 mg Intravenous Given 04/09/22 2314)  Chlorhexidine Gluconate Cloth 2 % PADS 6 each (has no administration in time range)  levETIRAcetam (KEPPRA) 2,000 mg in sodium chloride 0.9 % 250 mL IVPB (0 mg Intravenous Stopped 04/09/22 1503)    Followed by  levETIRAcetam (KEPPRA) 250 mg in sodium chloride 0.9 % 100 mL IVPB (0 mg Intravenous Stopped 04/09/22 2337)  ipratropium-albuterol (DUONEB) 0.5-2.5 (3) MG/3ML nebulizer solution 3 mL (has no administration in time range)  atorvastatin (LIPITOR) tablet 80 mg (has no administration in time range)  levothyroxine (SYNTHROID) tablet 125 mcg (125 mcg Oral Not Given 04/10/22 0542)  metoprolol tartrate (LOPRESSOR) tablet 12.5 mg (12.5 mg Oral Not Given 04/09/22 2131)  polyvinyl alcohol (LIQUIFILM TEARS) 1.4 % ophthalmic solution 1 drop (has no administration in time range)  rOPINIRole (REQUIP) tablet 2 mg (2 mg Oral Not Given 04/09/22 2132)  fluticasone furoate-vilanterol (BREO ELLIPTA) 200-25 MCG/ACT 1 puff (has no administration  in time range)  cyanocobalamin (VITAMIN B12) tablet 500 mcg (has no administration in time range)  0.9 %  sodium chloride infusion ( Intravenous Infusion Verify 04/10/22 0500)  Oral care mouth rinse (15 mLs Mouth Rinse Given 04/09/22 2132)  Oral care mouth rinse (has no administration in time range)  sodium chloride flush (NS) 0.9 % injection 3 mL (3 mLs Intravenous Given 04/09/22 1246)  iohexol (OMNIPAQUE) 350 MG/ML injection 75 mL (75 mLs Intravenous Contrast Given 04/09/22 1224)  LORazepam (ATIVAN) injection 2 mg (2 mg Intravenous Given 04/09/22 1439)    ED Course/ Medical Decision Making/ A&P                             Medical Decision Making Amount and/or Complexity of Data Reviewed Labs: ordered. Radiology: ordered.  Risk Decision regarding hospitalization.   Pt recently in the hospital for stroke.  CT today shows subarachnoid hemorrhage. Presentation also concerning for focal seizure.  Pt seen on arrival by Dr Iver Nestle.  Presentation concerning for hemorrhage conversion of recent PCA stroke with possible seizure activity.  Pt will be admitted to hospital, neurology service for further evaluation        Final Clinical Impression(s) / ED Diagnoses Final diagnoses:  Subarachnoid hemorrhage [facility])    Rx / DC Orders ED Discharge Orders     None         Linwood Dibbles, MD 04/10/22 [phone]

## 2022-04-09 NOTE — Progress Notes (Signed)
Spoke with Dr. Cheral Marker at the beginning of my shift.  Asked about doing the MRI tonight since MRI is requesting we get in contact with the MD that inserted pt's loop recorder to have them "back it up".  Dr. Cheral Marker said this will have to be done during the day tomorrow and to just take pt down for CT tonight.

## 2022-04-09 NOTE — ED Triage Notes (Signed)
Pt brought in by EMS after increased slurred speech, headache and weakness that started at 1105; followed by several episodes of emesis. Of note pt had a stroke last month and has had some slurred speech and L sided deficits since.

## 2022-04-09 NOTE — Progress Notes (Signed)
LTM EEG hooked up and running - no initial skin breakdown - push button tested - [facility] monitoring.  

## 2022-04-09 NOTE — H&P (Signed)
Neurology H&P  CC: AMS, left sided weakness  History is obtained from: Patient and chart review   HPI: James Richards is a 77 y.o. male with a past medical history significant for recent stroke, chronic right PCA occlusion, right vertebral occlusion, left carotid occlusion, hypertension, hyperlipidemia, BMI 29, left CRAO, poor vision, extremely hard of hearing, malignant neoplasm of the tongue and larynx s/p surgery, peripheral artery disease, loop recorder in place, restless leg syndrome on Requip.  He was recently discharged after admission for right M2 occlusion s/p thrombectomy (no thrombolytic given due to difficulty confirming with blood thinners he was on).  He was discharged on aspirin and Plavix and enrolled in the [facility] trial on 1/23, then readmitted on 2/17 with bilateral small embolic strokes of undetermined source.  Loop recorder was placed on 2/5.  Today he was complaining of a headache all day and his power of attorney had given him a chocolate protein drink.  She returned and was unsure if he had spilled drink on himself versus was covered in slobber, possibly urine.  On initial EMS arrival he was nonverbal, very weak on the left side, with much worse speech than his baseline.  His symptoms were gradually improving during transport.  During my evaluation he had intermittent forced right gaze concerning for focal seizures and head CT revealed a right subarachnoid hemorrhage but no new vessel occlusions on CTA   LKW: 11:05 AM Thrombolytic given?: No, SAH and recent stroke IA performed?: No, no LVO     3 - Moderate disability. Requires some help, but able to walk unassisted.   ROS: Unable to obtain due to altered mental status combined with baseline being very hard of hearing.   Past Medical History:  Diagnosis Date   Atherosclerosis of native arteries of the extremities with intermittent claudication 10/09/2013   Atherosclerotic peripheral vascular disease with  intermittent claudication ([place]) 10/03/2012   BMI 33.0-33.9,adult 07/07/2019   Carotid stenosis [id number]   Diastolic dysfunction [id number]   Essential hypertension    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   History of recurrent TIAs 12/11/2014   Impacted cerumen 11/26/2019   Malignant neoplasm of base of tongue ([place]) 01/21/2015   Malignant neoplasm of larynx, unspecified ([facility])    Migraine 04/02/2014   Mixed hyperlipidemia    Other fatigue    Other specified hypothyroidism    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    Restless legs syndrome    Restrictive lung disease 11/26/2019   TIA (transient ischemic attack) 11/24/2014   Vision exam with abnormal findings concern for left retinal artery occlusion 03/29/2021   Past Surgical History:  Procedure Laterality Date   IR CT HEAD LTD  03/06/2022   IR PERCUTANEOUS ART THROMBECTOMY/INFUSION INTRACRANIAL INC DIAG ANGIO  03/06/2022   LOOP RECORDER INSERTION N/A 04/04/2022   Procedure: LOOP RECORDER INSERTION;  Surgeon: Vickie Epley, MD;  Location: [facility];  Service: Cardiovascular;  Laterality: N/A;   LYMPHADENECTOMY     PERIPHERAL VASCULAR CATHETERIZATION N/A 11/24/2014   Procedure: Aortic Arch Angiography;  Surgeon: Angelia Mould, MD;  Location: [facility];  Service: Cardiovascular;  Laterality: N/A;   PERIPHERAL VASCULAR CATHETERIZATION N/A 11/24/2014   Procedure: Cerebral Angiography;  Surgeon: Angelia Mould, MD;  Location: [facility];  Service: Cardiovascular;  Laterality: N/A;   RADIOLOGY WITH ANESTHESIA N/A 03/06/2022   Procedure: RADIOLOGY WITH ANESTHESIA;  Surgeon: Radiologist, Medication, MD;  Location: [facility];  Service: Radiology;  Laterality: N/A;   THROAT SURGERY     TONSILECTOMY/ADENOIDECTOMY WITH MYRINGOTOMY     Current Outpatient Medications  Medication Instructions   acetaminophen-codeine (TYLENOL #3) 300-30 MG tablet TAKE 1 TABLET EVERY 4 HOURS AS  NEEDED FOR MODERATE PAIN   albuterol (VENTOLIN HFA) 108 (90 Base) MCG/ACT inhaler 1 puff, Inhalation, As needed   ascorbic acid (VITAMIN C) 500 mg, Oral, Daily   Aspirin Low Dose 81 mg, Oral, Daily, Swallow whole. FOR ONLY ONE MONTH.   atorvastatin (LIPITOR) 80 MG tablet TAKE 1 TABLET EVERY DAY   Brilinta 90 mg, Oral, 2 times daily   [START ON 05/03/2022] clopidogrel (PLAVIX) 75 mg, Oral, Daily, TO BE STARTED ON 05/03/22.   furosemide (LASIX) 40 mg, Oral, Every other day   levothyroxine (SYNTHROID) 125 mcg, Oral, Daily   metoprolol tartrate (LOPRESSOR) 25 MG tablet Take 1/2 tablet (12.5 mg total) by mouth 2 (two) times daily.   minocycline (MINOCIN) 50 mg, Oral, Daily   Naphazoline HCl (CLEAR EYES OP) 1-2 drops, Both Eyes, As needed   Omega-3 Fatty Acids (FISH OIL) 1200 MG CPDR 1 capsule, Oral, Daily   pantoprazole (PROTONIX) 40 mg, Oral, Daily   Potassium Bicarbonate 99 mg, Oral, Daily   rOPINIRole (REQUIP) 2 mg, Oral, Daily at bedtime   SYMBICORT 160-4.5 MCG/ACT inhaler 2 puffs, Inhalation, Daily   vitamin B-12 (CYANOCOBALAMIN) 500 mcg, Oral, Daily   zinc gluconate 50 mg, Oral, Daily     Family History  Problem Relation Age of Onset   Other Father        amputation   Deep vein thrombosis Father    Heart disease Father        Atrial Fib.   Cancer Sister    Hypertension Brother     Social History:  reports that he quit smoking about 15 years ago. His smoking use included cigarettes. He has a 41.00 pack-year smoking history. He has quit using smokeless tobacco.  His smokeless tobacco use included chew. He reports that he does not drink alcohol and does not use drugs.   Exam: Current vital signs: BP (!) 159/114   Pulse 63   Temp (!) 97.5 F (36.4 C) (Axillary)   Resp 19   Wt 79.4 kg   SpO2 99%   BMI 29.13 kg/m  Vital signs in last 24 hours: Temp:  [97.5 F (36.4 C)] 97.5 F (36.4 C) (02/24 1231) Pulse Rate:  [59-66] 63 (02/24 1255) Resp:  [17-20] 19 (02/24 1255) BP:  (144-162)/(81-114) 159/114 (02/24 1255) SpO2:  [99 %-100 %] 99 % (02/24 1255) Weight:  [79.4 kg] 79.4 kg (02/24 1200)   Physical Exam  Constitutional: Appears chronically ill, wetness on his shirt and pants (not appearing to be chocolate shake but clear fluid) Psych: Slow to respond Eyes: No scleral injection HENT: No oropharyngeal obstruction.  Poor dentition MSK: no major joint deformities.  Cardiovascular: Perfusing extremities well Respiratory: Effort normal, non-labored breathing GI: Soft.  No distension. There is no tenderness.  Skin: Warm dry and intact visible skin with scattered bruising  Neuro: Mental Status: Patient is sleepy.  Was able to state he is 10 and then would perseverate on this answer.  Later was able to state that the month is February.  Poor attention/concentration, language testing severely limited by his being hard of hearing but he was able to name some simple things and repeat.  Intermittent left-sided neglect Cranial Nerves: II: Visual Fields are concerning for possible left hemianopia. Pupils are equal, round, and  reactive to light.   III,IV, VI: Intermittent forced right gaze V: Facial sensation is symmetric to temperature VII: Facial movement is notable for left facial droop VIII: hearing is intact to voice X: Uvula elevates symmetrically XI: Shoulder shrug is symmetric. XII: tongue is midline without atrophy or fasciculations.  Motor: During the course of my evaluation his left-sided weakness does appear to be gradually improving.  Grossly normal strength on the right side.  However follows commands poorly Sensory: Grossly equally reactive to touch in all 4 extremities Deep Tendon Reflexes: 2+ and symmetric in the brachioradialis, 4+ right patella, 2+ left patella Cerebellar: Does not follow these commands Gait:  Deferred  NIHSS total 17 Score breakdown: 1 point for drowsiness, 1 point for not answering month correctly, 1 point for not  following commands reliably, 1 point for right gaze preference, 2 points for left facial droop, 2 points for left leg weakness, 2 points for right leg weakness, 2 points for right arm weakness, 2 points for left arm weakness, 1 for mild to moderate aphasia (versus hard of hearing), 1 point for dysarthria, 1 point for neglect  Regarding actually actionable symptoms, 1 point for drowsiness, 1-point for gaze preference, 2 points for facial droop, 1 for left arm weakness and 1 for left leg weakness greater than right arm and right leg weakness -- 6 points for new NIH stroke scale symptoms not readily referable to generalized confusion, being hard of hearing or baseline deficits  Performed at time of patient arrival to ED    I have reviewed labs in epic and the results pertinent to this consultation are:  Basic Metabolic Panel: Recent Labs  Lab 04/03/22 0344 04/09/22 1203 04/09/22 1208  NA 138 137 141  K 4.0 4.5 4.6  CL 105 101 101  CO2 24 30  --   GLUCOSE 90 95 93  BUN 11 20 28*  CREATININE 1.01 1.26* 1.20  CALCIUM 9.0 8.7*  --     CBC: Recent Labs  Lab 04/03/22 0344 04/09/22 1203 04/09/22 1208  WBC 5.6 6.1  --   NEUTROABS  --  4.5  --   HGB 12.9* 13.1 13.9  HCT 38.1* 40.1 41.0  MCV 92.0 96.6  --   PLT 233 263  --     Coagulation Studies: Recent Labs    04/09/22 1203  LABPROT 13.3  INR 1.0    Lab Results  Component Value Date   HGBA1C 5.7 (H) 03/06/2022   Lab Results  Component Value Date   CHOL 98 04/02/2022   HDL 46 04/02/2022   LDLCALC 43 04/02/2022   TRIG 45 04/02/2022   CHOLHDL 2.1 04/02/2022     I have reviewed the images obtained: Head CT personally reviewed, agree with radiology there is a small right occipital subarachnoid hemorrhage CTA personally reviewed, agree with radiology, multivessel chronic findings without new process 1. Negative for emergent large vessel occlusion. 2. Stable CTA demonstrating chronically occluded Left ICA and  Right Vertebral Artery. Left ICA terminus and fetal type Left PCA origin are reconstituted, probably via the [employer]. 3. Stable right carotid atherosclerosis without hemodynamically significant stenosis. Left vertebral artery supplies the basilar and arises directly from the aortic arch.  Impression: Suspect hemorrhagic conversion of his prior right PCA territory stroke, complicated by seizure with gradually improving postictal Todd's phenomenon but intermittent gaze deviation concerning for intermittent focal seizures  Recommendations:  # Hemorrhagic conversion of ischemic stroke, consider potential underlying metastasis given history of malignancy - Stroke labs  HgbA1c, fasting lipid panel - MRI brain w/ and w/o when stabilized to eval for underlying mass  - Stability scan in 6 hours (head CT if MRI cannot be timed for around this time) - Frequent neuro checks, q1hr  - Echocardiogram - Carotid dopplers - No antiplatelets due to [place] - DVT PPx heparin at 24 hrs if stable, SCDs for now - Risk factor modification - Loop recorder interrogation when able - Blood pressure goal SBP < 130-150, clomipramine ordered - PT consult, OT consult, Speech consult when patient stabilized  - Admitted to stroke service  # Focal seizures - Keppra 2 g load, then 250 mg BID. Adjust as needed for renal function and seizure control Estimated Creatinine Clearance: 51.7 mL/min (by C-G formula based on SCr of 1.2 mg/dL).   CrCl 30 to <50 mL/minute/1.73 m2: 250 to 750 mg every 12 hours. - Long-term EEG monitoring with MRI compatible leads   Regarding chronic medical conditions including COPD, RLS, and hypothyroidism: Medication reconciliation completed based on records within our chart, will need review after pharmacy has completed full medication reconciliation DuoNebs ordered as needed to replace home albuterol, Symbicort substituted with formulary inhaler, Requip for RLS reordered, ticagrelor and Plavix and  aspirin all held, furosemide as needed not ordered, home Synthroid reordered, vitamin B12 reordered   [place] [phone] Available 7 AM to 7 PM, outside these hours please contact Neurologist on call listed on AMION   Total critical care time: 90 minutes   Critical care time was exclusive of separately billable procedures and treating other patients.   Critical care was necessary to treat or prevent imminent or life-threatening deterioration.   Critical care was time spent personally by me on the following activities: development of treatment plan with patient and/or surrogate as well as nursing, discussions with consultants/primary team, evaluation of patient's response to treatment, examination of patient, obtaining history from patient or surrogate, ordering and performing treatments and interventions, ordering and review of laboratory studies, ordering and review of radiographic studies, and re-evaluation of patient's condition as needed, as documented above.

## 2022-04-09 NOTE — Progress Notes (Signed)
Per [facility], pt is not on the floor yet. I left my phone # to have rn call as soon as pt arrivs

## 2022-04-09 NOTE — Progress Notes (Signed)
Patient having increased restlessness and continually trying to get out of bed and frequently repositions. Unable to verbally redirect patient. MD notified and obtained orders for posey belt.

## 2022-04-09 NOTE — Progress Notes (Signed)
Patient having increased agitation and pulling at eeg leads, soft mittens applied first, but after repeated attempts, Dr. Curly Shores nofitied. Family  at bedside noted he was more agitated than normal. Bilateral soft wrist restraints applied and received one time order for 2 gm ativan IV from Dr. Curly Shores.

## 2022-04-10 ENCOUNTER — Inpatient Hospital Stay (HOSPITAL_COMMUNITY): Payer: Medicare PPO

## 2022-04-10 DIAGNOSIS — R4182 Altered mental status, unspecified: Secondary | ICD-10-CM | POA: Diagnosis not present

## 2022-04-10 DIAGNOSIS — I609 Nontraumatic subarachnoid hemorrhage, unspecified: Secondary | ICD-10-CM

## 2022-04-10 MED ORDER — DEXMEDETOMIDINE HCL IN NACL 400 MCG/100ML IV SOLN
0.0000 ug/kg/h | INTRAVENOUS | Status: DC
  Administered 2022-04-10 (×2): 0.4 ug/kg/h via INTRAVENOUS
  Filled 2022-04-10 (×2): qty 100
  Filled 2022-04-10: qty 200

## 2022-04-10 MED ORDER — SODIUM CHLORIDE 0.9 % IV SOLN: INTRAVENOUS | Status: DC

## 2022-04-10 NOTE — Evaluation (Signed)
Physical Therapy Evaluation Patient Details Name: James Richards MRN: [id number] DOB: 10/03/1945 Today's Date: 04/10/2022  History of Present Illness  77 yo male presents to ED On 2/25 as code stroke for AMS, ? seizures. CTH shows suspected hemorrhagic conversion of his prior right PCA territory stroke. Recent admission 0000000 for embolic cva with residual L weakness and slurred speech. 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  Clinical Impression   Pt presents with generalized weakness, impaired balance, impaired cognition with agitation this date, and decreased activity tolerance. Pt to benefit from acute PT to address deficits. Pt requiring min-mod +2 assist for transfer-level mobility, pt very irritable with periods of agitation and shouting "I have to pee, help me" throughout session and unable to be redirected. Last admission, per PT notes pt was mobilizing well and had no follow up PT needs, dramatic change from that this admission. PT recommending AIR post-acutely.  PT to progress mobility as tolerated, and will continue to follow acutely.         Recommendations for follow up therapy are one component of a multi-disciplinary discharge planning process, led by the attending physician.  Recommendations may be updated based on patient status, additional functional criteria and insurance authorization.  Follow Up Recommendations Acute inpatient rehab (3hours/day)      Assistance Recommended at Discharge Frequent or constant Supervision/Assistance  Patient can return home with the following  Direct supervision/assist for medications management;Direct supervision/assist for financial management;Assist for transportation;A lot of help with walking and/or transfers;A lot of help with  bathing/dressing/bathroom;Assistance with cooking/housework    Equipment Recommendations None recommended by PT  Recommendations for Other Services       Functional Status Assessment Patient has had a recent decline in their functional status and demonstrates the ability to make significant improvements in function in a reasonable and predictable amount of time.     Precautions / Restrictions Precautions Precautions: Fall Precaution Comments: low vision Restrictions Weight Bearing Restrictions: No      Mobility  Bed Mobility Overal bed mobility: Needs Assistance Bed Mobility: Supine to Sit, Sit to Supine     Supine to sit: Min assist, +2 for safety/equipment, HOB elevated Sit to supine: Max assist, +2 for physical assistance   General bed mobility comments: Min A for elevating trunk and gaining balance once sitting at EOB. Max A +2 for returning safely to supine    Transfers Overall transfer level: Needs assistance Equipment used: None Transfers: Sit to/from Stand Sit to Stand: Min assist, Mod assist           General transfer comment: Min-Mod A for gaining balance and maintaining safety    Ambulation/Gait         Gait velocity: decr     General Gait Details: lateral steps towards HOB min +2 for steadying, unable to progress away from bed given pt agitation and little to no command following  Stairs            Wheelchair Mobility    Modified Rankin (Stroke Patients Only) Modified Rankin (Stroke Patients Only) Pre-Morbid Rankin Score: Moderate disability Modified Rankin: Moderately severe disability     Balance Overall balance assessment: Needs assistance Sitting-balance support: No upper extremity supported, Feet supported Sitting balance-Leahy Scale: Fair     Standing balance support: No upper extremity supported, During functional activity Standing balance-Leahy Scale:  Poor                               Pertinent  Vitals/Pain Pain Assessment Pain Assessment: Faces Faces Pain Scale: No hurt Pain Intervention(s): Monitored during session    Home Living Family/patient expects to be discharged to:: Private residence Living Arrangements: Other relatives (cousin) Available Help at Discharge: Family;Available 24 hours/day Type of Home: House Home Access: Stairs to enter   [employer] of Steps: 2-3   Home Layout: One level Home Equipment: Conservation officer, nature (2 wheels);Kasandra Knudsen - single point Additional Comments: all information obtained from last admission (03/23/22)    Prior Function Prior Level of Function : Independent/Modified Independent               ADLs Comments: Pt unable to provide information due to cognition and increased agitation     Hand Dominance   Dominant Hand: Right    Extremity/Trunk Assessment   Upper Extremity Assessment Upper Extremity Assessment: Difficult to assess due to impaired cognition    Lower Extremity Assessment Lower Extremity Assessment: Difficult to assess due to impaired cognition    Cervical / Trunk Assessment Cervical / Trunk Assessment: Normal  Communication   Communication: Expressive difficulties  Cognition Arousal/Alertness: Awake/alert Behavior During Therapy: Agitated, Restless Overall Cognitive Status: No family/caregiver present to determine baseline cognitive functioning                                 General Comments: Upon arrival, pt restless and yelliong "help me!". Pt kicking it his over the end of bed and repeating he will "pee all over everything." Pt with decreased orientation to place, time, and situation. Not following commands and unable to answer simple questions. Perseverating on going to bathroom.        General Comments General comments (skin integrity, edema, etc.): vss    Exercises     Assessment/Plan    PT Assessment Patient needs continued PT services  PT Problem List Decreased  balance;Decreased mobility;Decreased cognition;Decreased safety awareness;Decreased strength;Decreased activity tolerance;Pain       PT Treatment Interventions DME instruction;Gait training;Stair training;Functional mobility training;Therapeutic activities;Therapeutic exercise;Balance training;Neuromuscular re-education;Cognitive remediation;Patient/family education    PT Goals (Current goals can be found in the Care Plan section)  Acute Rehab PT Goals Patient Stated Goal: did not state PT Goal Formulation: With patient Time For Goal Achievement: 04/24/22 Potential to Achieve Goals: Fair    Frequency Min 4X/week     Co-evaluation   Reason for Co-Treatment: For patient/therapist safety;To address functional/ADL transfers   OT goals addressed during session: ADL's and self-care       AM-PAC PT "6 Clicks" Mobility  Outcome Measure Help needed turning from your back to your side while in a flat bed without using bedrails?: A Little Help needed moving from lying on your back to sitting on the side of a flat bed without using bedrails?: A Little Help needed moving to and from a bed to a chair (including a wheelchair)?: A Lot Help needed standing up from a chair using your arms (e.g., wheelchair or bedside chair)?: A Lot Help needed to walk in hospital room?: Total Help needed climbing 3-5 steps with a railing? : Total 6 Click Score: 12    End of Session   Activity Tolerance: Treatment limited secondary to agitation Patient left: in bed;with call bell/phone within reach;with bed alarm set;with  restraints reapplied Nurse Communication: Mobility status PT Visit Diagnosis: Other abnormalities of gait and mobility (R26.89);Unsteadiness on feet (R26.81)    Time: 1215-1227 PT Time Calculation (min) (ACUTE ONLY): 12 min   Charges:   PT Evaluation $PT Eval Low Complexity: 1 Low          Kenita Bines S, PT DPT Acute Rehabilitation Services Pager [phone]  Office [phone]   Roxine Caddy  E Ruffin Pyo 04/10/2022, 3:22 PM

## 2022-04-10 NOTE — Progress Notes (Signed)
Spoke with Dr. Cheral Marker on phone.  Clarified BP goals.  Dr. Cheral Marker said to follow permissive hypertension protocol.  New SBP goal is < 180.

## 2022-04-10 NOTE — Evaluation (Signed)
Occupational Therapy Evaluation Patient Details Name: James Richards MRN: [id number] DOB: 1946-01-25 Today's Date: 04/10/2022   History of Present Illness 77 yo male presents to ED On 2/25 as code stroke for AMS, ? seizures. CTH shows suspected hemorrhagic conversion of his prior right PCA territory stroke. Recent admission 0000000 for embolic cva with residual L weakness and slurred speech. 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   Clinical Impression   Per chart review, pt was living with his cousin prior to last admission (2/16-2/19); pt unable to provide home information or PLOF due to current cognition. Upon arrival to room, pt restless and agitated swinging his legs over the EOB and yelling "help me" and "I will pee everywhere".  Pt presenting with poor cognition, strength, vision, balance, and safety. Pt not following commands or answering simple questions. Pt requiring Min-Mod A +2 for sit<>stand at EOB and Max A for ADLs due to cognition. Pt would benefit from further acute OT to facilitate safe dc. Recommend dc to [facility] for intensive OT to optimize safety, independence with ADLs, and return to PLOF.      Recommendations for follow up therapy are one component of a multi-disciplinary discharge planning process, led by the attending physician.  Recommendations may be updated based on patient status, additional functional criteria and insurance authorization.   Follow Up Recommendations  Acute inpatient rehab (3hours/day)     Assistance Recommended at Discharge Frequent or constant Supervision/Assistance  Patient can return home with the following A little help with walking and/or transfers;A little help with bathing/dressing/bathroom;Assistance with cooking/housework;Direct supervision/assist for  medications management;Direct supervision/assist for financial management;Assist for transportation;Help with stairs or ramp for entrance    Functional Status Assessment  Patient has had a recent decline in their functional status and demonstrates the ability to make significant improvements in function in a reasonable and predictable amount of time.  Equipment Recommendations  Other (comment) (Will further assess)    Recommendations for Other Services       Precautions / Restrictions Precautions Precautions: Fall Precaution Comments: low vision      Mobility Bed Mobility Overal bed mobility: Needs Assistance Bed Mobility: Supine to Sit, Sit to Supine     Supine to sit: Min assist, +2 for safety/equipment, HOB elevated Sit to supine: Max assist, +2 for physical assistance   General bed mobility comments: Min A for elevating trunk and gaining balance once sitting at EOB. Max A +2 for returning safely to supine    Transfers Overall transfer level: Needs assistance Equipment used: None Transfers: Sit to/from Stand Sit to Stand: Min assist, Mod assist           General transfer comment: Min-Mod A for gaining balance and maintaining safety      Balance Overall balance assessment: Needs assistance Sitting-balance support: No upper extremity supported, Feet supported Sitting balance-Leahy Scale: Poor     Standing balance support: No upper extremity supported, During functional activity Standing balance-Leahy Scale: Poor                             ADL either performed or assessed with clinical judgement   ADL Overall ADL's : Needs assistance/impaired  General ADL Comments: Max A for ADLs due to poor cognition and safety. Pt performing sit<>stand at EOB with Min-Mod A for balance and +2 for safety     Vision Baseline Vision/History: 2 Legally blind (L eye blind per chart review) Additional  Comments: Will need to further assess. Pt not tracking therapist or blinding to threat. Poor cognition impacting assessment     Perception     Praxis      Pertinent Vitals/Pain Pain Assessment Pain Assessment: Faces Faces Pain Scale: No hurt Pain Intervention(s): Monitored during session     Hand Dominance Right   Extremity/Trunk Assessment Upper Extremity Assessment Upper Extremity Assessment: Difficult to assess due to impaired cognition   Lower Extremity Assessment Lower Extremity Assessment: Difficult to assess due to impaired cognition       Communication Communication Communication: Expressive difficulties   Cognition Arousal/Alertness: Awake/alert Behavior During Therapy: Agitated, Restless Overall Cognitive Status: No family/caregiver present to determine baseline cognitive functioning                                 General Comments: Upon arrival, pt restless and yelliong "help me!". Pt kicking it his over the end of bed and repeating he will "pee all over everything." Pt with decreased orientation to place, time, and situation. Not following commands and unable to answer simple questions. Perseverating on going to bathroom.     General Comments  VSS on RA    Exercises     Shoulder Instructions      Home Living                                   Additional Comments: Per last admission (03/23/22) and chart review, pt was living with his cousin in a one level home.      Prior Functioning/Environment                 ADLs Comments: Pt unable to provide information due to cognition and increased agitation        OT Problem List: Decreased strength;Decreased range of motion;Decreased activity tolerance;Impaired balance (sitting and/or standing);Impaired vision/perception;Decreased coordination;Decreased cognition;Decreased safety awareness;Decreased knowledge of use of DME or AE;Decreased knowledge of precautions      OT  Treatment/Interventions: Self-care/ADL training;Therapeutic exercise;Energy conservation;DME and/or AE instruction;Neuromuscular education;Cognitive remediation/compensation;Therapeutic activities;Patient/family education;Visual/perceptual remediation/compensation    OT Goals(Current goals can be found in the care plan section) Acute Rehab OT Goals Patient Stated Goal: "Let me pee!" OT Goal Formulation: Patient unable to participate in goal setting Time For Goal Achievement: 04/24/22 Potential to Achieve Goals: Good  OT Frequency: Min 2X/week    Co-evaluation PT/OT/SLP Co-Evaluation/Treatment: Yes Reason for Co-Treatment: For patient/therapist safety;To address functional/ADL transfers   OT goals addressed during session: ADL's and self-care      AM-PAC OT "6 Clicks" Daily Activity     Outcome Measure Help from another person eating meals?: A Lot Help from another person taking care of personal grooming?: A Lot Help from another person toileting, which includes using toliet, bedpan, or urinal?: A Lot Help from another person bathing (including washing, rinsing, drying)?: A Lot Help from another person to put on and taking off regular upper body clothing?: A Lot Help from another person to put on and taking off regular lower body clothing?: A Lot 6 Click Score: 12   End of Session Nurse Communication:  Mobility status  Activity Tolerance: Treatment limited secondary to agitation Patient left: in bed;with call bell/phone within reach;with bed alarm set;with nursing/sitter in room;with restraints reapplied  OT Visit Diagnosis: Unsteadiness on feet (R26.81);Other abnormalities of gait and mobility (R26.89);Muscle weakness (generalized) (M62.81);Low vision, both eyes (H54.2);Other symptoms and signs involving cognitive function                Time: 1215-1227 OT Time Calculation (min): 12 min Charges:  OT General Charges $OT Visit: 1 Visit OT Evaluation $OT Eval Moderate Complexity: 1  Mod  Maksymilian Mabey MSOT, OTR/L Acute Rehab Office: [place] 04/10/2022, 1:32 PM

## 2022-04-10 NOTE — Progress Notes (Signed)
LTM maint complete - no skin breakdown [facility] monitored, Event button test confirmed by [facility]. ? ?

## 2022-04-10 NOTE — Progress Notes (Signed)
Pt at 2000 assessment disoriented x 4, drowsy, not following commands, eyes opening only to pain, pt not answering questions (pt did answer a few questions during the day per MD notes).  Pt received ativan at 1439.  Scheduled CT completed at 2012.  Pt's current neuro status unchanged from 2000 assessment.  Based on Dr. Lyn Records note and day shift RN description of pt during the day, this RN spoke with Dr. Cheral Marker about pt's current neuro status.  Pt's "neuro status change" from during the day occurred before the CT was done.  CT unchanged.

## 2022-04-10 NOTE — IPAL (Signed)
Interdisciplinary Goals of Care Family Meeting     Date carried out: 04/10/2022   Location of the meeting: Conference room   Member's involved: Physician, Family Member. 2 cousins-James Richards and James Richards(HCPOA). ICU nurse for part of the meeting.   Durable Power of Attorney or acting medical decision maker:  James Richards   Discussion: We discussed goals of care.    Code status: DNR/DNI   Disposition: Continue current acute care   Time spent for the meeting:  65mns.   GEgbert Garibaldi MD Neurology

## 2022-04-10 NOTE — Progress Notes (Signed)
OT Cancellation Note  Patient Details Name: James Richards MRN: [id number] DOB: [birth year]   Cancelled Treatment:    Reason Eval/Treat Not Completed: Active bedrest order (Will return as schedule allows and pt medically appropiate. Thank you.)  [place], OTR/L Acute Rehab Office: [phone] 04/10/2022, 9:07 AM

## 2022-04-10 NOTE — Procedures (Signed)
Patient Name: James Richards  MRN: [id number]  Epilepsy Attending: Lora Havens  Referring Physician/Provider: Lorenza Chick, MD  Duration: 04/09/2022 1352 to 04/10/2022 1352   Patient history: 77yo m with h/o recent right M2 occlusion s/p thrombectomy who ws noted to be  nonverbal, very weak on the left side, with much worse speech than his baseline.  His symptoms were gradually improving during transport. Later during neurology evaluation, he had intermittent forced right gaze concerning for focal seizures. EEG to evaluate for seizure   Level of alertness: Awake, asleep   AEDs during EEG study: LEV   Technical aspects: This EEG study was done with scalp electrodes positioned according to the 10-20 International system of electrode placement. Electrical activity was reviewed with band pass filter of 1-'70Hz'$ , sensitivity of 7 uV/mm, display speed of 22m/sec with a '60Hz'$  notched filter applied as appropriate. EEG data were recorded continuously and digitally stored.  Video monitoring was available and reviewed as appropriate.   Description: No clear posterior dominant rhythm was seen. Sleep was characterized by vertex waves, sleep spindles ( 12-'14hz'$ ), maximal fronto-central region. EEG showed continuous generalized and maximal bitemporal 3 to 6 Hz theta-delta slowing. Photic stimulation and hyperventilation were not performed.      ABNORMALITY - Continuous slow, generalized and maximal bitemporal   IMPRESSION: This study is suggestive of cortical dysfunction in bitemporal region likely secondary to underlying strokes. Additionally there is moderate diffuse encephalopathy, nonspecific etiology. No seizures or epileptiform discharges were seen throughout the recording.   Please note lack of epileptiform activity during interictal EEG does not exclude the diagnosis of epilepsy.   Timber Lucarelli OBarbra Sarks

## 2022-04-10 NOTE — Progress Notes (Addendum)
STROKE TEAM PROGRESS NOTE   INTERVAL HISTORY No family at the bedside. James Richards is connected to LTM with bilateral mittens on. RN at the bedside. NO new neurological events overnight. VSS, Labs WNL.  James Richards is more agitated today.  Discussed with family and they are worried about his confusion.  They have never seen him like this before in the previous hospital visits.  We had a detailed discussion about DNR/DNI decision.  Vitals:   04/10/22 0700 04/10/22 0800 04/10/22 0900 04/10/22 1000  BP: (!) 144/102 (!) 150/88 (!) 168/139 (!) 165/68  Pulse: 66 67 79 66  Resp: 18 20 (!) 22 20  Temp:  (!) 96.9 F (36.1 C)    TempSrc:  Oral    SpO2: 98% 97% 96% 97%  Weight:      Height:       CBC:  Recent Labs  Lab 04/09/22 1203 04/09/22 1208  WBC 6.1  --   NEUTROABS 4.5  --   HGB 13.1 13.9  HCT 40.1 41.0  MCV 96.6  --   PLT 263  --    Basic Metabolic Panel:  Recent Labs  Lab 04/09/22 1203 04/09/22 1208  NA 137 141  K 4.5 4.6  CL 101 101  CO2 30  --   GLUCOSE 95 93  BUN 20 28*  CREATININE 1.26* 1.20  CALCIUM 8.7*  --    Lipid Panel: No results for input(s): "CHOL", "TRIG", "HDL", "CHOLHDL", "VLDL", "LDLCALC" in the last 168 hours. HgbA1c: No results for input(s): "HGBA1C" in the last 168 hours. Urine Drug Screen: No results for input(s): "LABOPIA", "COCAINSCRNUR", "LABBENZ", "AMPHETMU", "THCU", "LABBARB" in the last 168 hours.  Alcohol Level  Recent Labs  Lab 04/09/22 1203  ETH <10    IMAGING past 24 hours Overnight EEG with video  Result Date: 04/10/2022 Lora Havens, MD     04/10/2022  8:38 AM Patient Name: James Richards MRN: [id number] Epilepsy Attending: Lora Havens Referring Physician/Provider: Lorenza Chick, MD Duration: 04/09/2022 1352 to 04/10/2022 [id number]  Patient history: 77yo m with h/o recent right M2 occlusion s/p thrombectomy who ws noted to be  nonverbal, very weak on the left side, with much worse speech than his baseline.  His symptoms were gradually improving  during transport. Later during neurology evaluation, James Richards had intermittent forced right gaze concerning for focal seizures. EEG to evaluate for seizure  Level of alertness: Awake, asleep  AEDs during EEG study: LEV  Technical aspects: This EEG study was done with scalp electrodes positioned according to the 10-20 International system of electrode placement. Electrical activity was reviewed with band pass filter of 1-'70Hz'$ , sensitivity of 7 uV/mm, display speed of 33m/sec with a '60Hz'$  notched filter applied as appropriate. EEG data were recorded continuously and digitally stored.  Video monitoring was available and reviewed as appropriate.  Description: No clear posterior dominant rhythm was seen. Sleep was characterized by vertex waves, sleep spindles ( 12-'14hz'$ ), maximal fronto-central region. EEG showed continuous generalized and maximal bitemporal 3 to 6 Hz theta-delta slowing. Photic stimulation and hyperventilation were not performed.    ABNORMALITY - Continuous slow, generalized and maximal bitemporal  IMPRESSION: This study is suggestive of cortical dysfunction in bitemporal region likely secondary to underlying strokes. Additionally there is moderate diffuse encephalopathy, nonspecific etiology. No seizures or epileptiform discharges were seen throughout the recording.  Please note lack of epileptiform activity during interictal EEG does not exclude the diagnosis of epilepsy.  [place] CT HEAD WO CONTRAST (  5MM)  Result Date: 04/09/2022 CLINICAL DATA:  Subarachnoid hemorrhage EXAM: CT HEAD WITHOUT CONTRAST TECHNIQUE: Contiguous axial images were obtained from the base of the skull through the vertex without intravenous contrast. RADIATION DOSE REDUCTION: This exam was performed according to the departmental dose-optimization program which includes automated exposure control, adjustment of the mA and/or kV according to patient size and/or use of iterative reconstruction technique. COMPARISON:  None  Available. FINDINGS: Brain: There is no mass, hemorrhage or extra-axial collection. There is generalized atrophy without lobar predilection. Hypodensity of the white matter is most commonly associated with chronic microvascular disease. Old infarcts of both temporal lobes. Vascular: Atherosclerotic calcification of the internal carotid arteries at the skull base. No abnormal hyperdensity of the major intracranial arteries or dural venous sinuses. Skull: The visualized skull base, calvarium and extracranial soft tissues are normal. Sinuses/Orbits: No fluid levels or advanced mucosal thickening of the visualized paranasal sinuses. No mastoid or middle ear effusion. The orbits are normal. IMPRESSION: 1. No acute intracranial abnormality. 2. Old infarcts of both temporal lobes and findings of chronic microvascular disease. Electronically Signed   By: Ulyses Jarred M.D.   On: 04/09/2022 23:16   Chest Port 1 View  Result Date: 04/09/2022 CLINICAL DATA:  Subarachnoid hemorrhage. EXAM: PORTABLE CHEST 1 VIEW COMPARISON:  AP chest 03/29/2021, chest two views 12/02/2019 FINDINGS: Single frontal view of the chest. Low lung volumes. There is technical magnification of the cardiac silhouette which does appear enlarged. Mediastinal contours are not well evaluated due to lung volumes and technique. Mild chronic bilateral interstitial thickening. No definite focal airspace opacity. No large pleural effusion. No pneumothorax. No acute skeletal abnormality. IMPRESSION: 1. Limited evaluation due to low lung volumes, patient rotation, and single AP view. 2. No definite acute cardiopulmonary disease. Electronically Signed   By: Yvonne Kendall M.D.   On: 04/09/2022 13:30   CT ANGIO HEAD NECK W WO CM (CODE STROKE)  Result Date: 04/09/2022 CLINICAL DATA:  77 year old male code stroke presentation. History of occluded left ICA. EXAM: CT ANGIOGRAPHY HEAD AND NECK TECHNIQUE: Multidetector CT imaging of the head and neck was performed  using the standard protocol during bolus administration of intravenous contrast. Multiplanar CT image reconstructions and MIPs were obtained to evaluate the vascular anatomy. Carotid stenosis measurements (when applicable) are obtained utilizing NASCET criteria, using the distal internal carotid diameter as the denominator. RADIATION DOSE REDUCTION: This exam was performed according to the departmental dose-optimization program which includes automated exposure control, adjustment of the mA and/or kV according to patient size and/or use of iterative reconstruction technique. CONTRAST:  67m OMNIPAQUE IOHEXOL 350 MG/ML SOLN COMPARISON:  Recent CTA head and neck 04/02/2022. Plain head CT earlier today. FINDINGS: CTA NECK Skeleton: Absent dentition. No acute osseous abnormality identified. Upper chest: No acute finding. Other neck: No acute finding. Aortic arch: Stable aortic arch, 4 vessel configuration with separate origin of the left vertebral. Mild Calcified aortic atherosclerosis. Right carotid system: Tortuous brachiocephalic artery and right CCA origin with mild plaque and no significant stenosis. Possible previous right carotid endarterectomy. Stable right ICA bulb plaque and irregularity with no hemodynamically significant stenosis. Stable tortuosity of the right ICA distal to the bulb. Left carotid system: Chronically occluded left ICA origin without reconstitution to the skull base. Vertebral arteries: Proximal right subclavian artery tortuosity and calcified plaque with up to 50% stenosis appears stable. The right vertebral artery origin is chronically occluded, with no reconstitution to the skull base. Left vertebral artery is patent and arises directly from  the arch. The left vertebral is patent to the skull base, mildly diminutive but without significant plaque or stenosis. CTA HEAD Posterior circulation: Chronically occluded distal right vertebral artery, the left supplies the basilar. Left PICA origin  remains patent. Patent basilar artery. Patent right AICA origin. Fetal type bilateral PCA origins and diminutive basilar tip appear unchanged. SCA origins remain patent. Bilateral PCA branches are stable with mild irregularity. Anterior circulation: Right ICA siphon is patent with moderate calcified plaque but only mild siphon stenosis. Normal right posterior communicating artery origin. Patent right carotid terminus. Patent right MCA and ACA origins. Normal anterior communicating artery. Patent left ICA terminus, left ACA and MCA origins, and left posterior communicating artery which appears stable. Mild left MCA origin irregularity and stenosis again noted. Bilateral ACA branches are stable and within normal limits. Right MCA M1 segment and bifurcation are patent without stenosis. Left MCA M1 segment and trifurcation remains patent. Bilateral MCA branches are stable. Chronic right M3/M4 calcified plaque again noted. Venous sinuses: Early contrast timing, not well evaluated. Anatomic variants: Fetal type PCA origins. Left vertebral artery arises directly from the arch. Review of the MIP images confirms the above findings IMPRESSION: 1. Negative for emergent large vessel occlusion. 2. Stable CTA demonstrating chronically occluded Left ICA and Right Vertebral Artery. Left ICA terminus and fetal type Left PCA origin are reconstituted, probably via the [employer]. 3. Stable right carotid atherosclerosis without hemodynamically significant stenosis. Left vertebral artery supplies the basilar and arises directly from the aortic arch. Electronically Signed   By: Genevie Ann M.D.   On: 04/09/2022 12:50   CT HEAD CODE STROKE WO CONTRAST  Result Date: 04/09/2022 CLINICAL DATA:  Code stroke. Stroke suspected. No additional history provided. EXAM: CT HEAD WITHOUT CONTRAST TECHNIQUE: Contiguous axial images were obtained from the base of the skull through the vertex without intravenous contrast. RADIATION DOSE REDUCTION: This exam  was performed according to the departmental dose-optimization program which includes automated exposure control, adjustment of the mA and/or kV according to patient size and/or use of iterative reconstruction technique. COMPARISON:  CT Head 04/01/22, MR Head 04/01/22 FINDINGS: Brain: Redemonstrated are chronic appearing infarcts in the bilateral MCA territory, unchanged prior exam. There is no definite CT evidence of a new cortical infarct. Compared to prior exam there are multiple new cortical calcific densities, for example in the right frontal lobe (series 2, image 22, 20), left parietal lobe (series 2, image 19, 18). These calcific densities are in regions of infarcts seen on prior MRI brain dated 04/01/2022. There is also new subarachnoid hemorrhage along the right occipital lobe (series 6, image 18), which is new compared to prior brain MRI. No hydrocephalus. Vascular: Atherosclerotic calcifications of the carotid siphons bilaterally. No disproportionately hyperdense vessel is visualized. Skull: Normal. Negative for fracture or focal lesion. Sinuses/Orbits: Bilateral lens replacement. No middle ear or mastoid effusion. There is abnormal soft tissue in the right EAC abutting the tympanic membrane, which could represent a cholesteatoma. This has increased compared to 2017. Recommend correlation with direct visualization. Other: None. IMPRESSION: 1. New subarachnoid hemorrhage along the right occipital lobe. 2. Multiple cortical calcific densities in the right frontal lobe, left parietal lobe, and right occipital lobe, which are in regions of infarcts seen on prior MRI brain dated 04/01/2022. 3. No definite CT evidence of a new cortical infarct. 4. Abnormal soft tissue in the right EAC abutting the tympanic membrane, which could represent a cholesteatoma. This has increased compared to 2017. Recommend correlation with direct visualization.  Findings were paged to Dr. Curly Shores on 04/09/22 at 12:22 PM via [facility] paging  system. Electronically Signed   By: Marin Roberts M.D.   On: 04/09/2022 12:22    PHYSICAL EXAM  Temp:  [96.9 F (36.1 C)-98.6 F (37 C)] 96.9 F (36.1 C) (02/25 0800) Pulse Rate:  [59-98] 66 (02/25 1000) Resp:  [10-31] 20 (02/25 1000) BP: (113-168)/(53-139) 165/68 (02/25 1000) SpO2:  [91 %-100 %] 97 % (02/25 1000) Weight:  [79.4 kg] 79.4 kg (02/24 1200)  General - Well nourished, well developed, in no apparent distress. Cardiovascular - Regular rhythm and rate.  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   Motor Tone - Muscle tone was assessed at the neck and appendages and was normal.  Sensory -withdraws to pain in all 4 extremities  Coordination - unable to  assess   Gait and Station - deferred.  ASSESSMENT/PLAN James Richards is a 77 y.o. 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 recently discharged after admission for right M2 occlusion s/p thrombectomy (no thrombolytic given due to difficulty confirming with blood thinners James Richards was on).  James Richards was discharged on aspirin and Plavix and enrolled in the [place] [facility] trial on 1/23, then readmitted on 2/17 with bilateral small embolic strokes of undetermined source.  Loop recorder was placed on 2/5. Presented to ED for evaluation of nonverbal, very weak on the left side, with much worse speech than his baseline     Stroke Hemorrhagic  conversion of ischemic stroke  vs seizure  Recent bilateral small embolic shower, embolic pattern, secondary to unclear source  Code Stroke  CT head New subarachnoid hemorrhage along the right occipital lobe  Repeat CT head no acute process  CTA head & neck No LVO  MRI  ordered  2D Echo 1/22: EF 60-65%. Left ventricular diastolic parameters are consistent with Grade II diastolic dysfunction Elevated left ventricular end-diastolic pressure.  Loop recorder placed 2/20  LDL 43 HgbA1c 5.7 VTE prophylaxis - SCD's    Diet   Diet NPO time specified   aspirin 81 mg daily and Brilinta (ticagrelor) 90 mg bid prior to admission, now on No antithrombotic.  Therapy recommendations:  pending Disposition:  pending   Recent bilateral small embolic shower, embolic pattern, secondary to unclear source  2D Echo EF 60 to 65% LE venous Doppler no DVT Pan CT no malignancy  loop recorder placed before discharge at last admission.   History of stroke 11/2014 admitted for angiogram for left ICA stenosis.  After procedure patient developed aphasia.  MRI showed right MCA and tiny left occipital infarcts.  Stroke likely related to procedure.  Angiogram showed left ICA/CCA 60 to 70% stenosis.  CTA neck showed left ICA 75 to 80% stenosis.  Carotid Doppler left ICA 60 to 79% stenosis.  EF 55 to 60%.  LDL 44.  Patient discharged on DAPT and Lipitor 80. 03/2021 patient admitted for left CRAO with partial improvement.  CTA head and neck showed  left ICA occluded.  Right VA occlusion.  Bilateral P2 stenosis.  MRI showed no acute infarct.  EF 60 to 65%.  LDL 47, A1c 5.8.  Again discharged on DAPT and Lipitor 80. 02/2022 admitted for left-sided weakness and slurred speech.  Found to have right M2 occlusion due to calcified embolus.  MRI showed right MCA infarct.  LDL 36, A1c 5.7, enrolled in [place] stroke trial.  Also discharged on DAPT and Lipitor 80.   Carotid stenosis/occlusion CT head and neck chronic left ICA  occlusion and right ICA atherosclerosis No intervention needed given chronic occlusion  Hypertension Home meds:  none Stable BP goal < 180 Long-term BP goal normotensive  Hyperlipidemia Home meds:  Lipitor 80 , resumed in hospital LDL 43, goal < 70 Continue statin at discharge  Acute Encephalopathy vs New focal seizures  Keppra 2 g load, then 250 mg BID.  LTM 2/24-2/25: Continuous slow, generalized and maximal bitemporal  This study is suggestive of cortical dysfunction in bitemporal region likely secondary to underlying strokes. Additionally there is moderate diffuse encephalopathy, nonspecific etiology. No seizures or epileptiform discharges were seen throughout the recording.   Dysphagia  ST when able  May need NGT   Other Stroke Risk Factors Advanced Age >/= 10  Former Cigarette smoker Hx stroke/TIA Coronary artery disease Congestive heart failure PVD  Other Active Problems Laryngeal/tongue cancer status post chemotherapy, radiation and partial glossectomy RLS Pulmonary fibrosis with chronic hypoxia AKI Cr 1.26- Increase IVF to 50cc/hr   Hospital day # 1  Beulah Gandy DNP, ACNPC-AG  [facility]  ATTENDING ATTESTATION:  77 year old with multiple recent strokes and recent admission where loop monitor was placed.  James Richards is also on a trial medication all of them stopped due to subarachnoid hemorrhage likely secondary to hemorrhagic conversion from his previous stroke.  On exam James Richards is agitated can tell me his name but otherwise does not follow other commands.  James Richards moves all 4 extremities spontaneously and is actively trying to get out of bed.  James Richards keeps his eyes shut during examination but when forced open appears to have pupils are equal round and reactive.  No forced gaze deviation.  Inconsistent blink to threat however James Richards may be blind at baseline due to previous retinal strokes per family.  James Richards is on LTM for seizures and is on Keppra LTM is negative so far.  James Richards is  agitated and confused requiring Cleviprex.  Unclear if James Richards can get MRI done with loop monitor placed recently.  Follow-up CT yesterday was stable.    Updated patient's family and his cousin who lives with him and is a power of attorney.  They requested DNR/DNI status.  Dr. Reeves Forth evaluated pt independently, reviewed imaging, chart, labs. Discussed and formulated plan with the Resident/APP. Changes were made to the note where appropriate. Please see APP/resident note above for details.     This patient is critically ill due to altered mental status/seizures, subarachnoid hemorrhage and stroke and at significant risk of neurological worsening, death form heart failure, respiratory failure, recurrent stroke, bleeding from [facility], seizure, sepsis. This patient's care requires constant monitoring of vital signs, hemodynamics, respiratory and cardiac monitoring, review of multiple databases, neurological assessment, discussion with family, other specialists and medical decision making of high complexity. I spent 35 minutes of neurocritical care time in the care of this patient.   Gerren Hoffmeier,MD   To contact Stroke Continuity provider, please refer to [email or web address] After hours, contact General Neurology

## 2022-04-10 NOTE — Progress Notes (Signed)
Attempted pt's currently ordered MRI. Pt moving whole body continuously. Maximum sedation per RN. Attempted one sequence of imaging. Pt still jerking around whole body continuously. Imaging completely non-diagnostic, pt unsafe for MRI in current condition. Sent back with RN escort.

## 2022-04-10 NOTE — Progress Notes (Signed)
LTM EEG reviewed. No electrographic seizures seen.   Electronically signed: Dr. Kerney Elbe

## 2022-04-10 NOTE — Progress Notes (Signed)
SLP Cancellation Note  Patient Details Name: SID PERCH MRN: [id number] DOB: 1946-02-09   Cancelled treatment:       Reason Eval/Treat Not Completed: Patient's level of consciousness. Orders received for Speech-language cognitive evaluation and bedside swallow evaluation. Patient not adequately arousable at this time. SLP will follow for readiness; recommend continue  NPO status.   Sonia Baller, MA, CCC-SLP Speech Therapy

## 2022-04-11 ENCOUNTER — Inpatient Hospital Stay (HOSPITAL_COMMUNITY): Payer: Medicare PPO

## 2022-04-11 DIAGNOSIS — I609 Nontraumatic subarachnoid hemorrhage, unspecified: Secondary | ICD-10-CM | POA: Diagnosis not present

## 2022-04-11 DIAGNOSIS — R4182 Altered mental status, unspecified: Secondary | ICD-10-CM | POA: Diagnosis not present

## 2022-04-11 LAB — CBC
HCT: 42.3 % (ref 39.0–52.0)
Hemoglobin: 14 g/dL (ref 13.0–17.0)
MCH: 31.1 pg (ref 26.0–34.0)
MCHC: 33.1 g/dL (ref 30.0–36.0)
MCV: 94 fL (ref 80.0–100.0)
Platelets: 245 10*3/uL (ref 150–400)
RBC: 4.5 MIL/uL (ref 4.22–5.81)
RDW: 14.4 % (ref 11.5–15.5)
WBC: 6.8 10*3/uL (ref 4.0–10.5)
nRBC: 0 % (ref 0.0–0.2)

## 2022-04-11 LAB — BASIC METABOLIC PANEL
Anion gap: 10 (ref 5–15)
BUN: 15 mg/dL (ref 8–23)
CO2: 22 mmol/L (ref 22–32)
Calcium: 9 mg/dL (ref 8.9–10.3)
Chloride: 107 mmol/L (ref 98–111)
Creatinine, Ser: 1.02 mg/dL (ref 0.61–1.24)
GFR, Estimated: >60 mL/min (ref >60)
Glucose, Bld: 91 mg/dL (ref 70–99)
Potassium: 4 mmol/L (ref 3.5–5.1)
Sodium: 139 mmol/L (ref 135–145)

## 2022-04-11 LAB — PHOSPHORUS: Phosphorus: 2.8 mg/dL (ref 2.5–4.6)

## 2022-04-11 LAB — MAGNESIUM: Magnesium: 2.1 mg/dL (ref 1.7–2.4)

## 2022-04-11 LAB — TRIGLYCERIDES: Triglycerides: 81 mg/dL (ref <150)

## 2022-04-11 MED ORDER — LIDOCAINE HCL URETHRAL/MUCOSAL 2 % EX GEL
1.0000 | Freq: Once | CUTANEOUS | Status: AC
  Administered 2022-04-11: 1 via URETHRAL
  Filled 2022-04-11: qty 11

## 2022-04-11 NOTE — Evaluation (Signed)
Speech Language Pathology Evaluation Patient Details Name: James Richards MRN: [id number] DOB: August 26, 1945 Today's Date: 04/11/2022 Time: 1155-1208 SLP Time Calculation (min) (ACUTE ONLY): 13 min  Problem List:  Patient Active Problem List   Diagnosis Date Noted   SAH (subarachnoid hemorrhage) ([place]) XX123456   Acute embolic stroke ([place]) 123XX123   Middle cerebral artery stenosis, right 03/19/2022   Left hemiparesis ([place]) 03/06/2022   Hypertensive heart disease with congestive heart failure and with combined systolic and diastolic dysfunction ([place]) 03/06/2022   Middle cerebral artery embolism, left 03/06/2022   Hearing loss of both ears due to cerumen impaction 08/24/2021   Otitis media, chronic with perforation 07/27/2021   Otitis media, purulent, acute, with spontaneous rupture of TM 07/27/2021   Central retinal artery occlusion of left eye 03/31/2021   Occlusion of extracranial carotid artery, left 03/31/2021   Hypertensive heart disease with chronic diastolic congestive heart failure ([place]) 06/23/2020   Mixed hearing loss 06/23/2020   Restrictive lung disease 11/26/2019   Other specified hypothyroidism    Hyperlipidemia    Restless legs syndrome    History of TIA (transient ischemic attack) 12/11/2014   Carotid stenosis 11/24/2014   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   Migraine 04/02/2014   Atherosclerosis of native artery of extremity with intermittent claudication ([place]) 10/09/2013   Past Medical History:  Past Medical History:  Diagnosis Date   Atherosclerosis of native arteries of the extremities with intermittent claudication 10/09/2013   Atherosclerotic peripheral vascular disease with intermittent claudication ([place]) 10/03/2012   BMI 33.0-33.9,adult 07/07/2019   Carotid stenosis [id number]   Diastolic dysfunction [id number]   Essential hypertension    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   History of recurrent TIAs 12/11/2014   Impacted cerumen  11/26/2019   Malignant neoplasm of base of tongue ([place]) 01/21/2015   Malignant neoplasm of larynx, unspecified (HCC)    Migraine 04/02/2014   Mixed hyperlipidemia    Other fatigue    Other specified hypothyroidism    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    Restless legs syndrome    Restrictive lung disease 11/26/2019   TIA (transient ischemic attack) 11/24/2014   Vision exam with abnormal findings concern for left retinal artery occlusion 03/29/2021   Past Surgical History:  Past Surgical History:  Procedure Laterality Date   IR CT HEAD LTD  03/06/2022   IR PERCUTANEOUS ART THROMBECTOMY/INFUSION INTRACRANIAL INC DIAG ANGIO  03/06/2022   LOOP RECORDER INSERTION N/A 04/04/2022   Procedure: LOOP RECORDER INSERTION;  Surgeon: Vickie Epley, MD;  Location: [facility];  Service: Cardiovascular;  Laterality: N/A;   LYMPHADENECTOMY     PERIPHERAL VASCULAR CATHETERIZATION N/A 11/24/2014   Procedure: Aortic Arch Angiography;  Surgeon: Angelia Mould, MD;  Location: [facility];  Service: Cardiovascular;  Laterality: N/A;   PERIPHERAL VASCULAR CATHETERIZATION N/A 11/24/2014   Procedure: Cerebral Angiography;  Surgeon: Angelia Mould, MD;  Location: [facility];  Service: Cardiovascular;  Laterality: N/A;   RADIOLOGY WITH ANESTHESIA N/A 03/06/2022   Procedure: RADIOLOGY WITH ANESTHESIA;  Surgeon: Radiologist, Medication, MD;  Location: [facility];  Service: Radiology;  Laterality: N/A;   THROAT SURGERY     TONSILECTOMY/ADENOIDECTOMY WITH MYRINGOTOMY     HPI:  James Richards is a 77 yo male presenting to ED on 2/25 with AMS and possible seizures. CTH shows suspected hemorrhagic conversion of prior PCA territory stroke. Recent admission 0000000 for embolic CVA with residual L sided weakness. James Richards has a history of  cognitive-linguistic deficits, dysarthria,  and dysphagia with most recent evaluations completed on 04/02/22. PMH includes previous CVA x3, HTN,  HLD, poor vision, extemely HOH, malignant neoplasm of the tongue and larynx s/p radiation, R lymph node dissection (2009) with recurrence in 2016 s/p partial glossectomy, lung disease   Assessment / Plan / Recommendation Clinical Impression  Per chart, James Richards had a partial glossectomy as well as noted dysarthria which appears to affect his overall intelligibility. James Richards has noted cognitive-linguistic deficits at baseline, evidenced by recent administration of portions of the SLUMS (04/02/22) with noted difficulties with short-term memory, attention, and functional problem solving. SLP unable to confirm cognitive-linguistic baseline with family members today and James Richards's participation limited ability to adequately assess. James Richards's noted agitation with PO trials and with functional cognitive tasks (throwing items) demonstrates impairments with awareness, memory, problem solving, and attention. James Richards perseverated on his bathroom needs and the location of his family member throughout the session. He required frequent cues to remind him the purpose of the session. Will need to better confirm previous level of functioning with family, and SLP will f/u to better assess through functional tasks.    SLP Assessment  SLP Recommendation/Assessment: Patient needs continued Speech Lanaguage Pathology Services SLP Visit Diagnosis: Cognitive communication deficit (R41.841);Dysarthria and anarthria (R47.1)    Recommendations for follow up therapy are one component of a multi-disciplinary discharge planning process, led by the attending physician.  Recommendations may be updated based on patient status, additional functional criteria and insurance authorization.    Follow Up Recommendations  Acute inpatient rehab (3hours/day)    Assistance Recommended at Discharge  Frequent or constant Supervision/Assistance  Functional Status Assessment Patient has had a recent decline in their functional status and demonstrates the ability to make  significant improvements in function in a reasonable and predictable amount of time.  Frequency and Duration min 2x/week  2 weeks      SLP Evaluation Cognition  Overall Cognitive Status: No family/caregiver present to determine baseline cognitive functioning Arousal/Alertness: Awake/alert Orientation Level: Oriented to person;Disoriented to time;Disoriented to situation;Disoriented to place Attention: Sustained Sustained Attention: Impaired Sustained Attention Impairment: Verbal basic Memory: Impaired Memory Impairment: Decreased short term memory Decreased Short Term Memory: Verbal basic Awareness: Impaired Awareness Impairment: Intellectual impairment Problem Solving: Impaired Problem Solving Impairment: Verbal basic Behaviors: Restless;Physical agitation;Verbal agitation;Perseveration Safety/Judgment: Impaired       Comprehension  Auditory Comprehension Overall Auditory Comprehension: Impaired Commands: Impaired One Step Basic Commands: 0-24% accurate Interfering Components: Hearing;Attention;Working Curator: Not tested Reading Comprehension Reading Status: Not tested    Expression Expression Primary Mode of Expression: Verbal Verbal Expression Overall Verbal Expression: Impaired Initiation: Impaired Automatic Speech: Name Level of Generative/Spontaneous Verbalization: Phrase   Oral / Motor  Oral Motor/Sensory Function Overall Oral Motor/Sensory Function: Other (comment) (James Richards did not follow commands) Motor Speech Overall Motor Speech: Impaired at baseline Respiration: Within functional limits Phonation: Normal Resonance: Within functional limits Articulation: Impaired Level of Impairment: Word Intelligibility: Intelligibility reduced Word: 75-100% accurate Sentence: 50-74% accurate Motor Planning: Impaired           Fabio Asa., Student SLP  04/11/2022, 3:38 PM

## 2022-04-11 NOTE — Evaluation (Signed)
Clinical/Bedside Swallow Evaluation Patient Details  Name: BURCE TOYE MRN: [id number] Date of Birth: [birth year]  Today's Date: 04/11/2022 Time: SLP Start Time (ACUTE ONLY): [id number] SLP Stop Time (ACUTE ONLY): 1155 SLP Time Calculation (min) (ACUTE ONLY): 13 min  Past Medical History:  Past Medical History:  Diagnosis Date   Atherosclerosis of native arteries of the extremities with intermittent claudication 10/09/2013   Atherosclerotic peripheral vascular disease with intermittent claudication ([place]) 10/03/2012   BMI 33.0-33.9,adult 07/07/2019   Carotid stenosis [id number]   Diastolic dysfunction [id number]   Essential hypertension    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   History of recurrent TIAs 12/11/2014   Impacted cerumen 11/26/2019   Malignant neoplasm of base of tongue ([place]) 01/21/2015   Malignant neoplasm of larynx, unspecified (HCC)    Migraine 04/02/2014   Mixed hyperlipidemia    Other fatigue    Other specified hypothyroidism    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    Restless legs syndrome    Restrictive lung disease 11/26/2019   TIA (transient ischemic attack) 11/24/2014   Vision exam with abnormal findings concern for left retinal artery occlusion 03/29/2021   Past Surgical History:  Past Surgical History:  Procedure Laterality Date   IR CT HEAD LTD  03/06/2022   IR PERCUTANEOUS ART THROMBECTOMY/INFUSION INTRACRANIAL INC DIAG ANGIO  03/06/2022   LOOP RECORDER INSERTION N/A 04/04/2022   Procedure: LOOP RECORDER INSERTION;  Surgeon: Vickie Epley, MD;  Location: [facility];  Service: Cardiovascular;  Laterality: N/A;   LYMPHADENECTOMY     PERIPHERAL VASCULAR CATHETERIZATION N/A 11/24/2014   Procedure: Aortic Arch Angiography;  Surgeon: Angelia Mould, MD;  Location: [facility];  Service: Cardiovascular;  Laterality: N/A;   PERIPHERAL VASCULAR CATHETERIZATION N/A 11/24/2014   Procedure: Cerebral Angiography;   Surgeon: Angelia Mould, MD;  Location: [facility];  Service: Cardiovascular;  Laterality: N/A;   RADIOLOGY WITH ANESTHESIA N/A 03/06/2022   Procedure: RADIOLOGY WITH ANESTHESIA;  Surgeon: Radiologist, Medication, MD;  Location: [facility];  Service: Radiology;  Laterality: N/A;   THROAT SURGERY     TONSILECTOMY/ADENOIDECTOMY WITH MYRINGOTOMY     HPI:  Pt is a 77 yo male presenting to ED on 2/25 with AMS and possible seizures. CTH shows suspected hemorrhagic conversion of prior PCA territory stroke. Recent admission 0000000 for embolic CVA with residual L sided weakness. Pt has a history of cognitive-linguistic deficits, dysarthria,  and dysphagia with most recent evaluations completed on 04/02/22. PMH includes previous CVA x3, HTN, HLD, poor vision, extemely HOH, malignant neoplasm of the tongue and larynx s/p radiation, R lymph node dissection (2009) with recurrence in 2016 s/p partial glossectomy, lung disease    Assessment / Plan / Recommendation  Clinical Impression  Pt appeared confused and agitated throughout the session, perseverating on his bathroom needs. Pt intermittently responded to questions and did not follow commands to complete an oral motor exam; however, per chart, had a partial glossectomy which likely impacts his overall lingual strength and ROM. SLP attempted presentations of ice chips, thin liquids via straw, and purees which pt refused. Pt has demonstrated ability to safely swallow during recent admission (04/02/22), with no overt s/s of aspiration and SLP initiated Dys 2 diet with thin liquids at that time. Suspect pt may be at his baseline level if returning to neurological baseline; however, his mentation and participation greatly affect the ability to evaluate and initiate a safe diet. Since SLP was unable to assess swallowing  function today, unable to recommend specific PO diet. Per RN, pt will likely receive a Cortrak today. SLP will f/u as pt participation  allows. SLP Visit Diagnosis: Dysphagia, unspecified (R13.10)    Aspiration Risk  Mild aspiration risk    Diet Recommendation Other (Comment) (NPO vs baseline diet per MD discretion)   Medication Administration: Via alternative means    Other  Recommendations Oral Care Recommendations: Oral care QID    Recommendations for follow up therapy are one component of a multi-disciplinary discharge planning process, led by the attending physician.  Recommendations may be updated based on patient status, additional functional criteria and insurance authorization.  Follow up Recommendations Acute inpatient rehab (3hours/day)      Assistance Recommended at Discharge    Functional Status Assessment Patient has had a recent decline in their functional status and demonstrates the ability to make significant improvements in function in a reasonable and predictable amount of time.  Frequency and Duration min 2x/week  2 weeks       Prognosis Prognosis for improved oropharyngeal function: Good Barriers to Reach Goals: Cognitive deficits      Swallow Study   General HPI: Pt is a 77 yo male presenting to ED on 2/25 with AMS and possible seizures. CTH shows suspected hemorrhagic conversion of prior PCA territory stroke. Recent admission 0000000 for embolic CVA with residual L sided weakness. Pt has a history of cognitive-linguistic deficits, dysarthria,  and dysphagia with most recent evaluations completed on 04/02/22. PMH includes previous CVA x3, HTN, HLD, poor vision, extemely HOH, malignant neoplasm of the tongue and larynx s/p radiation, R lymph node dissection (2009) with recurrence in 2016 s/p partial glossectomy, lung disease Type of Study: Bedside Swallow Evaluation Previous Swallow Assessment: see HPI Diet Prior to this Study: NPO Temperature Spikes Noted: No Respiratory Status: Room air History of Recent Intubation: No Behavior/Cognition: Alert;Requires cueing;Uncooperative Oral Cavity  Assessment: Dry Oral Care Completed by SLP: No Oral Cavity - Dentition: Dentures, bottom;Dentures, top Vision: Functional for self-feeding Self-Feeding Abilities: Refused PO Patient Positioning: Upright in bed Baseline Vocal Quality: Normal Volitional Cough: Cognitively unable to elicit Volitional Swallow: Unable to elicit    Oral/Motor/Sensory Function Overall Oral Motor/Sensory Function: Other (comment) (pt did not follow commands)   Ice Chips Ice chips: Not tested   Thin Liquid Thin Liquid: Not tested    Nectar Thick Nectar Thick Liquid: Not tested   Honey Thick Honey Thick Liquid: Not tested   Puree Puree: Not tested   Solid     Solid: Not tested      Fabio Asa., Student SLP  04/11/2022,12:51 PM

## 2022-04-11 NOTE — Progress Notes (Signed)
Physical Therapy Treatment Patient Details Name: James Richards MRN: [id number] DOB: 1945-05-20 Today's Date: 04/11/2022   History of Present Illness 77 yo male presents to ED On 2/25 as code stroke for AMS, ? seizures. CTH shows suspected hemorrhagic conversion of his prior right PCA territory stroke. Recent admission 0000000 for embolic cva with residual L weakness and slurred speech. 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    PT Comments    Patient progressing to hallway ambulation though continued limited interactions except about urinary symptoms.  Now with foley, but needs frequent redirection about having no need to "go pee".  Patient with L side inattention needing mod cues to grip with L hand and for attention to L side as pt attempting to turn into rooms on R side.  Patient remains appropriate for acute inpatient rehab prior to d/c home.  PT will continue to follow.    Recommendations for follow up therapy are one component of a multi-disciplinary discharge planning process, led by the attending physician.  Recommendations may be updated based on patient status, additional functional criteria and insurance authorization.  Follow Up Recommendations  Acute inpatient rehab (3hours/day)     Assistance Recommended at Discharge Frequent or constant Supervision/Assistance  Patient can return home with the following A little help with walking and/or transfers;A lot of help with bathing/dressing/bathroom;Direct supervision/assist for medications management;Assist for transportation;Help with stairs or ramp for entrance   Equipment Recommendations  None recommended by PT    Recommendations for Other Services       Precautions / Restrictions Precautions Precautions: Fall Precaution Comments: low  vision     Mobility  Bed Mobility Overal bed mobility: Needs Assistance Bed Mobility: Supine to Sit     Supine to sit: Min assist     General bed mobility comments: assist for balance and lines    Transfers Overall transfer level: Needs assistance Equipment used: Rolling walker (2 wheels) Transfers: Sit to/from Stand, Bed to chair/wheelchair/BSC Sit to Stand: Min assist, +2 safety/equipment   Step pivot transfers: Min assist, +2 safety/equipment       General transfer comment: assist for lines and balance with sit to stand, step to recliner with RW and A for positioning and backing safely to chair,    Ambulation/Gait Ambulation/Gait assistance: Mod assist, +2 safety/equipment Gait Distance (Feet): 45 Feet Assistive device: Rolling walker (2 wheels) Gait Pattern/deviations: Step-through pattern, Decreased step length - left, Shuffle       General Gait Details: assist for safety with walker as pt attempting to turn to R due to L inattention and perseveration on urinary symptoms. chair follow for safety and assisted to chair in hallway due to limited verbalizations and continually turning R into other patient rooms   Stairs             Wheelchair Mobility    Modified Rankin (Stroke Patients Only) Modified Rankin (Stroke Patients Only) Pre-Morbid Rankin Score: Moderate disability Modified Rankin: Moderately severe disability     Balance Overall balance assessment: Needs assistance Sitting-balance support: No upper extremity supported, Feet supported Sitting balance-Leahy Scale: Fair     Standing balance support: Bilateral upper extremity supported Standing balance-Leahy Scale: Poor  Cognition Arousal/Alertness: Awake/alert Behavior During Therapy: Anxious Overall Cognitive Status: No family/caregiver present to determine baseline cognitive functioning Area of Impairment: Memory, Problem solving, Attention,  Safety/judgement                   Current Attention Level: Selective Memory: Decreased short-term memory   Safety/Judgement: Decreased awareness of safety, Decreased awareness of deficits   Problem Solving: Slow processing General Comments: needed frequent redirection to foley as continued to feel he needed to "pee" despite education, minimal other interactions except about urniary symptoms, RN aware        Exercises      General Comments General comments (skin integrity, edema, etc.): VSS with mobility, RN aware of urinary symptoms      Pertinent Vitals/Pain Pain Assessment Faces Pain Scale: Hurts little more Pain Location: urinary symptoms Pain Descriptors / Indicators: Discomfort, Grimacing Pain Intervention(s): Monitored during session, Repositioned    Home Living                          Prior Function            PT Goals (current goals can now be found in the care plan section) Progress towards PT goals: Progressing toward goals    Frequency    Min 4X/week      PT Plan Current plan remains appropriate    Co-evaluation              AM-PAC PT "6 Clicks" Mobility   Outcome Measure  Help needed turning from your back to your side while in a flat bed without using bedrails?: A Little Help needed moving from lying on your back to sitting on the side of a flat bed without using bedrails?: A Little Help needed moving to and from a bed to a chair (including a wheelchair)?: A Lot Help needed standing up from a chair using your arms (e.g., wheelchair or bedside chair)?: A Lot Help needed to walk in hospital room?: A Lot Help needed climbing 3-5 steps with a railing? : Total 6 Click Score: 13    End of Session Equipment Utilized During Treatment: Gait belt Activity Tolerance: Patient tolerated treatment well Patient left: in chair;with chair alarm set;with call bell/phone within reach   PT Visit Diagnosis: Other abnormalities of gait  and mobility (R26.89);Unsteadiness on feet (R26.81)     Time: [id number] PT Time Calculation (min) (ACUTE ONLY): 24 min  Charges:  $Gait Training: 8-22 mins $Therapeutic Activity: 8-22 mins                     Magda Kiel, PT Acute Rehabilitation Services Office:[phone] 04/11/2022    Reginia Naas 04/11/2022, 4:37 PM

## 2022-04-11 NOTE — Progress Notes (Addendum)
STROKE TEAM PROGRESS NOTE   INTERVAL HISTORY No family at the bedside. RN at bedside.  No new neurological events overnight. VSS, Labs WNL. Repeat CT (in place of MRI) negative, will speak with family about medication compliance and current anti-platelet options.   LTM discontinued.  Vitals:   04/11/22 1000 04/11/22 1100 04/11/22 1200 04/11/22 1204  BP: 137/84 (!) 142/83 (!) 143/92   Pulse: 89 86 93   Resp: (!) 22 (!) 21 (!) 25   Temp:    97.9 F (36.6 C)  TempSrc:    Axillary  SpO2: 100% 98% 99%   Weight:      Height:       CBC:  Recent Labs  Lab 04/09/22 1203 04/09/22 1208 04/11/22 0331  WBC 6.1  --  6.8  NEUTROABS 4.5  --   --   HGB 13.1 13.9 14.0  HCT 40.1 41.0 42.3  MCV 96.6  --  94.0  PLT 263  --  99991111    Basic Metabolic Panel:  Recent Labs  Lab 04/09/22 1203 04/09/22 1208 04/11/22 0331  NA 137 141 139  K 4.5 4.6 4.0  CL 101 101 107  CO2 30  --  22  GLUCOSE 95 93 91  BUN 20 28* 15  CREATININE 1.26* 1.20 1.02  CALCIUM 8.7*  --  9.0  MG  --   --  2.1  PHOS  --   --  2.8    Lipid Panel:  Recent Labs  Lab 04/11/22 0331  TRIG 81   HgbA1c: No results for input(s): "HGBA1C" in the last 168 hours. Urine Drug Screen: No results for input(s): "LABOPIA", "COCAINSCRNUR", "LABBENZ", "AMPHETMU", "THCU", "LABBARB" in the last 168 hours.  Alcohol Level  Recent Labs  Lab 04/09/22 1203  ETH <10     IMAGING past 24 hours No results found.  PHYSICAL EXAM  Temp:  [96.9 F (36.1 C)-98.1 F (36.7 C)] 97.9 F (36.6 C) (02/26 1204) Pulse Rate:  [43-96] 93 (02/26 1200) Resp:  [11-29] 25 (02/26 1200) BP: (94-164)/(48-92) 143/92 (02/26 1200) SpO2:  [89 %-100 %] 99 % (02/26 1200)  General - Well nourished, well developed elderly Caucasian male, in no apparent distress. Cardiovascular - Regular rhythm and rate.  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   Motor Tone - Muscle tone was assessed at the neck and appendages and was normal.  Sensory -withdraws to pain in all 4 extremities  Coordination - unable to  assess   Gait and Station - deferred.  ASSESSMENT/PLAN [name] is a 77 y.o. 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 recently discharged after admission for right M2 occlusion s/p thrombectomy (no thrombolytic given due to difficulty confirming with blood thinners he was on).  He was discharged on aspirin and Plavix and enrolled in the [facility] [facility] trial on 1/23, then readmitted on 2/17 with bilateral small embolic strokes of undetermined source.  Loop recorder was placed on 2/5. Presented  to ED for evaluation of nonverbal, very weak on the left side, with much worse speech than his baseline     Acute metabolic encephalopathy? Recent bilateral small embolic shower, embolic pattern, secondary to unclear source  Code Stroke  CT head New subarachnoid hemorrhage along the right occipital lobe  Repeat CT head no acute process  CTA head & neck No LVO  MRI  discontinued d/t patients inability to remain still for testing.  Repeat CT 2/26: no hemorrhage, stable infarcts.  2D Echo 1/22: EF 60-65%. Left ventricular diastolic parameters are consistent with Grade II diastolic dysfunction Elevated left ventricular end-diastolic pressure.  Loop recorder placed 2/20  LDL 43 HgbA1c 5.7 VTE prophylaxis - SCD's    Diet   Diet NPO time  specified   aspirin 81 mg daily and Brilinta (ticagrelor) 90 mg bid prior to admission, now on No antithrombotic. Pending discussion with the family regarding starting medications back versus switching.  Therapy recommendations:  AIR Disposition:  pending CIR rehab consult assessment   Recent bilateral small embolic shower, embolic pattern, secondary to unclear source  2D Echo EF 60 to 65% LE venous Doppler no DVT Pan CT no malignancy  loop recorder placed before discharge at last admission. Interrogation per [employer] [facility]: NSR with ectopy only.    History of stroke 11/2014 admitted for angiogram for left ICA stenosis.  After procedure patient developed aphasia.  MRI showed right MCA and tiny left occipital infarcts.  Stroke likely related to procedure.  Angiogram showed left ICA/CCA 60 to 70% stenosis.  CTA neck showed left ICA 75 to 80% stenosis.  Carotid Doppler left ICA 60 to 79% stenosis.  EF 55 to 60%.  LDL 44.  Patient discharged on DAPT and Lipitor 80. 03/2021 patient admitted for left CRAO with partial improvement.  CTA head and neck showed left ICA occluded.  Right VA occlusion.  Bilateral P2 stenosis.  MRI showed no acute infarct.  EF 60 to 65%.  LDL 47, A1c 5.8.  Again discharged on DAPT and Lipitor 80. 02/2022 admitted for left-sided weakness and slurred speech.  Found to have right M2 occlusion due to calcified embolus.  MRI showed right MCA infarct.  LDL 36, A1c 5.7, enrolled in [place] stroke trial.  Also discharged on DAPT and Lipitor 80.   Carotid stenosis/occlusion CT head and neck chronic left ICA occlusion and right ICA atherosclerosis No intervention needed given chronic occlusion  Hypertension Home meds:  none Stable BP goal < 180 Long-term BP goal normotensive  Hyperlipidemia Home meds:  Lipitor 80 , resumed in hospital LDL 43, goal < 70 Continue statin at discharge  Acute Encephalopathy vs New focal seizures  LTM 2/24-2/25: Continuous slow, generalized and  maximal bitemporal  This study is suggestive of cortical dysfunction in bitemporal region likely secondary to underlying strokes. Additionally there is moderate diffuse encephalopathy, nonspecific etiology. No seizures or epileptiform discharges were seen throughout the recording. LTM 2/25 1352 to 2/26 1006 This study is suggestive of cortical dysfunction in bitemporal region likely secondary to underlying strokes. Additionally there is moderate diffuse encephalopathy, nonspecific etiology. No seizures or epileptiform discharges were seen throughout the recording  Keppra 2 g load, then 250 mg BID, discontinued 2/26 as no focal seizures seen throughout LTM EEG monitoring. LTM EEG discontinued   Dysphagia  Hx of partial glossectomy/radiation.  Dys 2 diet on recent admission (04/02/22). Suspect he is at is baseline for swallowing, will order CoreTrak to ensure adequate PO intake and medication administration. Speech Therapy  recommends AIR disposition  Other Stroke Risk Factors Advanced Age >/= 78  Former Cigarette smoker, cessation education provided. Hx stroke/TIA Coronary artery disease Congestive heart failure PVD  Other Active Problems Laryngeal/tongue cancer status post chemotherapy, radiation and partial glossectomy RLS Pulmonary fibrosis with chronic hypoxia AKI Cr 1.26- Increase IVF to 50cc/hr 2/25, Cr improved to 1.02 today, will continue IVF.  DNR Status: requested yesterday by POA, patients cousin.   Hospital day # 2   Pt seen by Neuro NP/APP and later by MD. Note/plan to be edited by MD as needed.    Otelia Santee, DNP, AGACNP-BC [facility] Please use AMION for pager and EPIC for messaging STROKE MD NOTE :  I have personally obtained history,examined this patient, reviewed notes, independently viewed imaging studies, participated in medical decision making and plan of care.ROS completed by me personally and pertinent positives fully documented  I have made  any additions or clarifications directly to the above note. Agree with note above.  Patient known to stroke team from admission in January for cryptogenic stroke and was enrolled in the [facility] for stroke prevention study.  He presented with recurrent strokes 1 week ago and study medication was discontinued.  He presented again yesterday with altered mental status and encephalopathy with suspected seizures.  Long-term EEG monitoring overnight does not confirm any seizures and patient is too agitated and restless to undergo MRI scan.  CT scan of the head x 2 shows no acute abnormality and unchanged appearance of chronic infarcts.  There was a concern of right occipital subarachnoid hemorrhage initial CT during the current admission but not subsequently noted on other CTs making this a questionable finding.  CT scan also showed multiple calcific lesions possibly calcified vessels.  Today's exam looks much improved.  Check swallow eval and resume dual antiplatelet therapy if able to swallow.  No family available at the bedside for discussion.  Will transfer to neurology floor bed.  Discontinue Keppra as no definite witnessed seizure as well as -24-hour monitoring.  No obvious cause of encephalopathy found so far with normal CBC, CMP and BMP.  Will ask EP team to interrogate loop recorder for PAF. Will reconsider getting an MRI if patient is more cooperative tomorrow.  Greater than 50% time during this 50-minute visit was spent on counseling and coordination of care about his presentation discussion of differential diagnosis of seizures versus encephalopathy and stroke and answering questions Antony Contras, MD Medical Director [place] [facility] Pager: [phone] 04/11/2022 4:51 PM

## 2022-04-11 NOTE — Progress Notes (Signed)
Speech Language Pathology Treatment: Dysphagia  Patient Details Name: James Richards MRN: [id number] DOB: Oct 25, 1945 Today's Date: 04/11/2022 Time: 1336-1400 SLP Time Calculation (min) (ACUTE ONLY): 24 min  Assessment / Plan / Recommendation Clinical Impression  SLP was asked to return now that family was at bedside, as they felt as though they could assist in getting pt to eat and drink. He did consume POs although in fairly limited quantities. Pt was fed by his cousin James Richards, his POA), needing verbal in addition to tactile cues to close his lips around a spoon. He would then swallow, leaving what appeared to be moderate oral residue behind. His cousin says that the amount of residue is near baseline for him, although he normally would not need the cues to close his mouth. Pt had a delayed cough x1 before he then started drinking a small amount of thin liquids, then declining any further POs offered.   Discussed current situation with James Richards as pt then started to fall asleep. She describes his current cognitive status to me a little more, saying that he is exhibiting improvements from previous date but still not back to his baseline. She describes disorientation, asking of repetitive questions as he is not understanding or retaining information, and needing additional cues as he participates in functional tasks that he is typically able to perform. Understandably, she has concerns about placing a Cortrak and what that may further do to some of his agitation. I said that if he started a PO diet, even though he is swallowing, he is at an increased risk for aspiration in the setting of his baseline dysphagia now with acute cognitive changes. However, we could try to start with small offerings of purees and thin liquids (including meds crushed in puree), focusing more on safety and strategies to try to mitigate (but not eliminate) risk. This might mean that he does not consume a lot of POs, but would not push  him if he is saying no or not alert and attentive enough to try. She verbalizes her understanding and prefers to try PO diet with careful assistance as opposed to considering cortrak.    HPI HPI: Pt is a 77 yo male presenting to ED on 2/25 with AMS and possible seizures. CTH shows suspected hemorrhagic conversion of prior PCA territory stroke. Recent admission 0000000 for embolic CVA with residual L sided weakness. Pt has a history of cognitive-linguistic deficits, dysarthria,  and dysphagia with most recent evaluations completed on 04/02/22. PMH includes previous CVA x3, HTN, HLD, poor vision, extemely HOH, malignant neoplasm of the tongue and larynx s/p radiation, R lymph node dissection (2009) with recurrence in 2016 s/p partial glossectomy, lung disease      SLP Plan  Continue with current plan of care  Patient needs continued Speech Lanaguage Pathology Services   Recommendations for follow up therapy are one component of a multi-disciplinary discharge planning process, led by the attending physician.  Recommendations may be updated based on patient status, additional functional criteria and insurance authorization.    Recommendations  Diet recommendations: Dysphagia 1 (puree);Thin liquid Liquids provided via: Straw;Cup Medication Administration: Crushed with puree Supervision: Staff to assist with self feeding;Trained caregiver to feed patient;Full supervision/cueing for compensatory strategies Compensations: Minimize environmental distractions;Slow rate;Small sips/bites Postural Changes and/or Swallow Maneuvers: Seated upright 90 degrees;Upright 30-60 min after meal                Oral Care Recommendations: Oral care QID Follow Up Recommendations: Acute inpatient rehab (3hours/day) Assistance  recommended at discharge: Frequent or constant Supervision/Assistance SLP Visit Diagnosis: Dysphagia, unspecified (R13.10) Plan: Continue with current plan of care           Osie Bond., M.A. [place] Office [phone]  Secure chat preferred   04/11/2022, 4:42 PM

## 2022-04-11 NOTE — Progress Notes (Signed)
LTM EEG discontinued - no skin breakdown at unhook. [facility] notified 

## 2022-04-11 NOTE — Progress Notes (Signed)
Inpatient Rehab Admissions Coordinator:    Pt. Continues on Cleviprex drip, not yet medically stable for CIR. Will follow up tomorrow.   Clemens Catholic, [place], [place] Admissions Coordinator  [phone] ([place]) [phone] (office)

## 2022-04-11 NOTE — Progress Notes (Signed)
Pt last I&O 1800.  Scanned pt at 0000 at the 6 hr mark.  Pt had 205 mL of urine in bladder.  No urine output this shift.  At 0300 this RN attempted I&O cath and was unsuccessful.  Met resistance before entering bladder.  Small amount of blood present in catheter tip after removal.  Pt very restless in bed, but unable to tell me if he feels like he needs to pee.  Dr. Cheral Marker paged.  VO to place coude foley.

## 2022-04-11 NOTE — Procedures (Signed)
Patient Name: James Richards  MRN: [id number]  Epilepsy Attending: Lora Havens  Referring Physician/Provider: Lorenza Chick, MD  Duration: 04/10/2022 1352 to 04/11/2022 1006   Patient history: 77yo m with h/o recent right M2 occlusion s/p thrombectomy who ws noted to be  nonverbal, very weak on the left side, with much worse speech than his baseline.  His symptoms were gradually improving during transport. Later during neurology evaluation, he had intermittent forced right gaze concerning for focal seizures. EEG to evaluate for seizure   Level of alertness: Awake, asleep   AEDs during EEG study: LEV   Technical aspects: This EEG study was done with scalp electrodes positioned according to the 10-20 International system of electrode placement. Electrical activity was reviewed with band pass filter of 1-'70Hz'$ , sensitivity of 7 uV/mm, display speed of 17m/sec with a '60Hz'$  notched filter applied as appropriate. EEG data were recorded continuously and digitally stored.  Video monitoring was available and reviewed as appropriate.   Description: No clear posterior dominant rhythm was seen. Sleep was characterized by vertex waves, sleep spindles ( 12-'14hz'$ ), maximal fronto-central region. EEG showed continuous generalized and maximal bitemporal 3 to 6 Hz theta-delta slowing. Photic stimulation and hyperventilation were not performed.      ABNORMALITY - Continuous slow, generalized and maximal bitemporal   IMPRESSION: This study is suggestive of cortical dysfunction in bitemporal region likely secondary to underlying strokes. Additionally there is moderate diffuse encephalopathy, nonspecific etiology. No seizures or epileptiform discharges were seen throughout the recording.   Please note lack of epileptiform activity during interictal EEG does not exclude the diagnosis of epilepsy.   [name]

## 2022-04-11 NOTE — Progress Notes (Signed)
Inpatient Rehab Admissions Coordinator:  ? ?Per therapy recommendations,  patient was screened for CIR candidacy by Davyd Podgorski, MS, CCC-SLP. At this time, Pt. Appears to be a a potential candidate for CIR. I will place   order for rehab consult per protocol for full assessment. Please contact me any with questions. ? ?Nancee Brownrigg, MS, CCC-SLP ?Rehab Admissions Coordinator  ?[phone] (celll) ?[phone] (office) ? ?

## 2022-04-12 ENCOUNTER — Inpatient Hospital Stay (HOSPITAL_COMMUNITY): Payer: Medicare PPO

## 2022-04-12 DIAGNOSIS — I609 Nontraumatic subarachnoid hemorrhage, unspecified: Secondary | ICD-10-CM | POA: Diagnosis not present

## 2022-04-12 LAB — BASIC METABOLIC PANEL
Anion gap: 12 (ref 5–15)
BUN: 16 mg/dL (ref 8–23)
CO2: 22 mmol/L (ref 22–32)
Calcium: 9.1 mg/dL (ref 8.9–10.3)
Chloride: 105 mmol/L (ref 98–111)
Creatinine, Ser: 0.94 mg/dL (ref 0.61–1.24)
GFR, Estimated: >60 mL/min (ref >60)
Glucose, Bld: 88 mg/dL (ref 70–99)
Potassium: 4.1 mmol/L (ref 3.5–5.1)
Sodium: 139 mmol/L (ref 135–145)

## 2022-04-12 LAB — MAGNESIUM: Magnesium: 2.2 mg/dL (ref 1.7–2.4)

## 2022-04-12 LAB — CBC
HCT: 40 % (ref 39.0–52.0)
Hemoglobin: 13.2 g/dL (ref 13.0–17.0)
MCH: 31.1 pg (ref 26.0–34.0)
MCHC: 33 g/dL (ref 30.0–36.0)
MCV: 94.3 fL (ref 80.0–100.0)
Platelets: 258 10*3/uL (ref 150–400)
RBC: 4.24 MIL/uL (ref 4.22–5.81)
RDW: 14.8 % (ref 11.5–15.5)
WBC: 8.4 10*3/uL (ref 4.0–10.5)
nRBC: 0 % (ref 0.0–0.2)

## 2022-04-12 LAB — PHOSPHORUS: Phosphorus: 2.5 mg/dL (ref 2.5–4.6)

## 2022-04-12 MED ORDER — ENOXAPARIN SODIUM 40 MG/0.4ML IJ SOSY
40.0000 mg | PREFILLED_SYRINGE | INTRAMUSCULAR | Status: DC
  Administered 2022-04-12 – 2022-04-13 (×2): 40 mg via SUBCUTANEOUS
  Filled 2022-04-12 (×2): qty 0.4

## 2022-04-12 MED ORDER — PANTOPRAZOLE SODIUM 40 MG PO TBEC
40.0000 mg | DELAYED_RELEASE_TABLET | Freq: Every day | ORAL | Status: DC
  Administered 2022-04-12: 40 mg via ORAL
  Filled 2022-04-12: qty 1

## 2022-04-12 MED ORDER — CLOPIDOGREL BISULFATE 75 MG PO TABS
75.0000 mg | ORAL_TABLET | Freq: Every day | ORAL | Status: DC
  Administered 2022-04-12 – 2022-04-14 (×3): 75 mg via ORAL
  Filled 2022-04-12 (×3): qty 1

## 2022-04-12 MED ORDER — HALOPERIDOL LACTATE 5 MG/ML IJ SOLN
2.0000 mg | Freq: Once | INTRAMUSCULAR | Status: AC
  Administered 2022-04-13: 2 mg via INTRAVENOUS
  Filled 2022-04-12: qty 1

## 2022-04-12 NOTE — Progress Notes (Signed)
STROKE TEAM PROGRESS NOTE   INTERVAL HISTORY No family at the bedside.  No new neurological events overnight. VSS, Labs WNL.  Patient appears to be more sleepy this morning.  He can be aroused with will barely follow commands.  Speech is dysarthric and at times nonsensical difficult to understand.  He moves all limbs purposefully except left upper extremity which moves only to painful stimuli I called the listed family health power of attorney phone number yesterday but was unable to leave a message as voicemail was not activated  LTM discontinued.  No witnessed seizures.  CT scan yesterday showed no acute abnormality.  Vitals:   04/12/22 0600 04/12/22 0700 04/12/22 0800 04/12/22 1002  BP: (!) 148/68 132/78 (!) 156/95 (!) 171/88  Pulse: 69 63 60 61  Resp: '17 15 15   '$ Temp:   98.1 F (36.7 C)   TempSrc:   Axillary   SpO2: 98% 100% 97%   Weight:      Height:       CBC:  Recent Labs  Lab 04/09/22 1203 04/09/22 1208 04/11/22 0331 04/12/22 0423  WBC 6.1  --  6.8 8.4  NEUTROABS 4.5  --   --   --   HGB 13.1   < > 14.0 13.2  HCT 40.1   < > 42.3 40.0  MCV 96.6  --  94.0 94.3  PLT 263  --  245 258   < > = values in this interval not displayed.   Basic Metabolic Panel:  Recent Labs  Lab 04/11/22 0331 04/12/22 0423  NA 139 139  K 4.0 4.1  CL 107 105  CO2 22 22  GLUCOSE 91 88  BUN 15 16  CREATININE 1.02 0.94  CALCIUM 9.0 9.1  MG 2.1 2.2  PHOS 2.8 2.5   Lipid Panel:  Recent Labs  Lab 04/11/22 0331  TRIG 81   HgbA1c: No results for input(s): "HGBA1C" in the last 168 hours. Urine Drug Screen: No results for input(s): "LABOPIA", "COCAINSCRNUR", "LABBENZ", "AMPHETMU", "THCU", "LABBARB" in the last 168 hours.  Alcohol Level  Recent Labs  Lab 04/09/22 1203  ETH <10    IMAGING past 24 hours CT HEAD WO CONTRAST (5MM)  Result Date: 04/11/2022 CLINICAL DATA:  Stroke, follow-up. EXAM: CT HEAD WITHOUT CONTRAST TECHNIQUE: Contiguous axial images were obtained from the base  of the skull through the vertex without intravenous contrast. RADIATION DOSE REDUCTION: This exam was performed according to the departmental dose-optimization program which includes automated exposure control, adjustment of the mA and/or kV according to patient size and/or use of iterative reconstruction technique. COMPARISON:  Head CT 04/09/2022.  MRI brain 04/01/2022. FINDINGS: Brain: No acute intracranial hemorrhage. Unchanged chronic infarcts in the bilateral MCA territories. Unchanged lacunar infarcts in the left basal ganglia and thalamus. No hydrocephalus or extra-axial collection. No mass effect or midline shift. Vascular: No hyperdense vessel or unexpected calcification. Skull: No calvarial fracture or suspicious bone lesion. Skull base is unremarkable. Sinuses/Orbits: Unremarkable. Other: None. IMPRESSION: 1. No acute intracranial hemorrhage. 2. Unchanged chronic infarcts in the bilateral MCA territories. Unchanged lacunar infarcts in the left basal ganglia and thalamus. Electronically Signed   By: Emmit Alexanders M.D.   On: 04/11/2022 14:15    PHYSICAL EXAM  Temp:  [97.8 F (36.6 C)-98.5 F (36.9 C)] 98.1 F (36.7 C) (02/27 0800) Pulse Rate:  [60-93] 61 (02/27 1002) Resp:  [13-26] 15 (02/27 0800) BP: (116-171)/(68-138) 171/88 (02/27 1002) SpO2:  [94 %-100 %] 97 % (02/27 0800)  General - Well nourished, well developed elderly Caucasian male, in no apparent distress. Cardiovascular - Regular rhythm and rate.  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   Motor Tone - Muscle tone was assessed at the neck and appendages and was normal.  Sensory -withdraws to pain in all 4  extremities  Coordination - unable to  assess   Gait and Station - deferred.  ASSESSMENT/PLAN James Richards is a 77 y.o. male with history of recent stroke, chronic right PCA occlusion, right vertebral occlusion, left carotid occlusion, hypertension, hyperlipidemia, BMI 56, left CRAO, poor vision, extremely hard of hearing, malignant neoplasm of the tongue and larynx s/p surgery, peripheral artery disease, loop recorder in place, restless leg syndrome on Requip. recently discharged after admission for right M2 occlusion s/p thrombectomy (no thrombolytic given due to difficulty confirming with blood thinners he was on).  He was discharged on aspirin and Plavix and enrolled in the [place] [facility] trial on 1/23, then readmitted on 2/17 with bilateral small embolic strokes of undetermined source.  Loop recorder was placed on 2/5. Presented to ED for evaluation of nonverbal, very weak on the left side, with much worse speech than his baseline     Acute metabolic encephalopathy? Recent bilateral small embolic shower, embolic pattern, secondary to unclear source  Code Stroke  CT head New subarachnoid hemorrhage along the right occipital lobe  Repeat CT head no acute process  CTA head & neck No LVO  MRI  discontinued d/t patients inability to remain still for testing.  Repeat CT 2/26: no hemorrhage, stable infarcts.  2D Echo 1/22: EF 60-65%. Left ventricular diastolic parameters are consistent with Grade II diastolic dysfunction Elevated left ventricular end-diastolic pressure.  Loop recorder placed 2/20  LDL 43 HgbA1c 5.7 VTE prophylaxis - SCD's    Diet   DIET - DYS 1 Room service appropriate? No; Fluid consistency: Thin   aspirin 81 mg daily and Brilinta (ticagrelor) 90 mg bid prior to admission, now on No antithrombotic. Pending discussion with the family regarding starting medications back versus switching.  Therapy recommendations:  AIR Disposition:  pending CIR rehab consult assessment    Recent bilateral small embolic shower, embolic pattern, secondary to unclear source  2D Echo EF 60 to 65% LE venous Doppler no DVT Pan CT no malignancy  loop recorder placed before discharge at last admission. Interrogation per [employer]: NSR with ectopy only.    History of stroke 11/2014 admitted for angiogram for left ICA stenosis.  After procedure patient developed aphasia.  MRI showed right MCA and tiny left occipital infarcts.  Stroke likely related to procedure.  Angiogram showed left ICA/CCA 60 to 70% stenosis.  CTA neck showed left ICA 75 to 80% stenosis.  Carotid Doppler left ICA 60 to 79% stenosis.  EF 55 to 60%.  LDL 44.  Patient discharged on DAPT and Lipitor 80. 03/2021 patient admitted for left CRAO with partial improvement.  CTA head and neck showed left ICA occluded.  Right VA occlusion.  Bilateral P2 stenosis.  MRI showed no acute infarct.  EF 60 to 65%.  LDL 47, A1c 5.8.  Again discharged on DAPT and Lipitor 80. 02/2022 admitted for left-sided weakness and slurred speech.  Found to have right M2 occlusion due to calcified embolus.  MRI showed right MCA infarct.  LDL 36, A1c 5.7, enrolled in [place] stroke trial.  Also discharged on DAPT and Lipitor 80.   Carotid stenosis/occlusion CT head and neck chronic left ICA occlusion and right ICA atherosclerosis No intervention needed given chronic occlusion  Hypertension Home meds:  none Stable BP goal < 180 Long-term BP goal normotensive  Hyperlipidemia Home meds:  Lipitor 80 , resumed in hospital LDL 43, goal < 70 Continue statin at discharge  Acute Encephalopathy vs New focal seizures  LTM 2/24-2/25: Continuous slow, generalized and maximal bitemporal  This study is suggestive of cortical dysfunction in bitemporal region likely secondary to underlying strokes. Additionally there is moderate diffuse encephalopathy, nonspecific etiology. No seizures or epileptiform discharges were seen throughout the recording. LTM 2/25  1352 to 2/26 1006 This study is suggestive of cortical dysfunction in bitemporal region likely secondary to underlying strokes. Additionally there is moderate diffuse encephalopathy, nonspecific etiology. No seizures or epileptiform discharges were seen throughout the recording  Keppra 2 g load, then 250 mg BID, discontinued 2/26 as no focal seizures seen throughout LTM EEG monitoring. LTM EEG discontinued   Dysphagia  Hx of partial glossectomy/radiation.  Dys 2 diet on recent admission (04/02/22). Suspect he is at is baseline for swallowing, will order CoreTrak to ensure adequate PO intake and medication administration. Speech Therapy recommends AIR disposition  Other Stroke Risk Factors Advanced Age >/= 59  Former Cigarette smoker, cessation education provided. Hx stroke/TIA Coronary artery disease Congestive heart failure PVD  Other Active Problems Laryngeal/tongue cancer status post chemotherapy, radiation and partial glossectomy RLS Pulmonary fibrosis with chronic hypoxia AKI Cr 1.26- Increase IVF to 50cc/hr 2/25, Cr improved to 1.02 today, will continue IVF.  DNR Status: requested yesterday by POA, patients cousin.   Hospital day # 3   Patient known to stroke team from admission in January for cryptogenic stroke and was enrolled in the [place] stroke trial for stroke prevention study.  He presented with recurrent strokes 1 week ago and study medication was discontinued.  He presented again yesterday with altered mental status and encephalopathy with suspected seizures.  Long-term EEG monitoring overnight does not confirm any seizures and patient is too agitated and restless to undergo MRI scan.  CT scan of the head x 2 shows no acute abnormality and unchanged appearance of chronic infarcts.  There was a concern of right occipital subarachnoid hemorrhage initial CT during the current admission but not subsequently noted on other CTs making this a questionable finding.  CT scan also  showed multiple calcific lesions possibly calcified vessels.  Today's exam looks mostly.  And harder to arouse with some left upper extremity weakness.  CT scan yesterday that showed no acute abnormality.  Continues to be sleepy and focal weakness may need to repeat in the CT scan tomorrow.  No family available at the bedside for discussion.  Will transfer to neurology floor bed   No obvious cause of encephalopathy found so far with normal CBC, CMP and BMP  Will reconsider getting an MRI if patient is more cooperative tomorrow.  Greater than 50% time during this 35-minute visit was spent on counseling and coordination of care about his presentation discussion of differential diagnosis of seizures versus encephalopathy and stroke and answering questions.  Discussed with Dr. Avon Gully.  Called the listed phone number from [name] patient's power of attorney but yet again unable to leave message as voicemail is not  activated Antony Contras, MD Medical Director [facility] Pager: [phone] 04/12/2022 10:52 AM

## 2022-04-12 NOTE — TOC Initial Note (Addendum)
Transition of Care [facility]) - Initial/Assessment Note    Patient Details  Name: James Richards MRN: [id number] Date of Birth: 24-May-1945  [facility]) CM/SW Contact:    Benard Halsted, LCSW Phone Number: 04/12/2022, 2:33 PM  Clinical Narrative:                 2:33pm-CSW received consult stating patient's POA prefers SNF placement for rehab. CSW attempted to contact her with no voicemail available. Will continue attempts. SNF referral sent out.   3:52pm-CSW was able to reach [place]. She stated she has taken care of patient for years since his daughter is not able to. She reported preference for [facility]. CSW will follow up with facility on referral pending removal of restraint belt as it will need to be off for at least 24 hours from DC. Checking status with RN.   4:12pm-[facility] able to accept patient pending insurance auth and restraint requirement. CSW also spoke with [name] about potential need to apply for Medicaid for future needs and she reported she will contact [facility] to follow up.   Expected Discharge Plan: Skilled Nursing Facility Barriers to Discharge: Continued Medical Work up, Ship broker, SNF Pending bed offer   Patient Goals and CMS Choice Patient states their goals for this hospitalization and ongoing recovery are:: Rehab CMS Medicare.gov Compare Post Acute Care list provided to:: Patient Represenative (must comment) Choice offered to / list presented to : [place] / [place] ownership interest in [facility].provided to:: [facility] POA / Guardian    Expected Discharge Plan and Services In-house Referral: Clinical Social Work   Post Acute Care Choice: [place] Living arrangements for the past 2 months: [place]                                      Prior Living Arrangements/Services Living arrangements for the past 2 months: Single Family Home Lives with:: Relatives Patient  language and need for interpreter reviewed:: Yes Do you feel safe going back to the place where you live?: Yes      Need for Family Participation in Patient Care: Yes (Comment) Care giver support system in place?: Yes (comment)   Criminal Activity/Legal Involvement Pertinent to Current Situation/Hospitalization: No - Comment as needed  Activities of Daily Living      Permission Sought/Granted Permission sought to share information with : Facility Sport and exercise psychologist, Family Supports Permission granted to share information with : Yes, Verbal Permission Granted  Share Information with NAME: [name]  Permission granted to share info w AGENCY: SNFs  Permission granted to share info w Relationship: Cousin/POA  Permission granted to share info w Contact Information: [phone]  Emotional Assessment Appearance:: Appears stated age Attitude/Demeanor/Rapport: Unable to Assess Affect (typically observed): Unable to Assess Orientation: : Oriented to Self Alcohol / Substance Use: Not Applicable Psych Involvement: No (comment)  Admission diagnosis:  SAH (subarachnoid hemorrhage) ([place]) [I60.9] Patient Active Problem List   Diagnosis Date Noted   Acute embolic stroke ([place]) [id number]   Middle cerebral artery stenosis, right 03/19/2022   Left hemiparesis ([place]) 03/06/2022   Hypertensive heart disease with congestive heart failure and with combined systolic and diastolic dysfunction ([place]) 03/06/2022   Middle cerebral artery embolism, left 03/06/2022   Hearing loss of both ears due to cerumen impaction 08/24/2021   Otitis media, chronic with perforation 07/27/2021   Otitis  media, purulent, acute, with spontaneous rupture of TM 07/27/2021   Central retinal artery occlusion of left eye 03/31/2021   Occlusion of extracranial carotid artery, left 03/31/2021   Hypertensive heart disease with chronic diastolic congestive heart failure ([place]) 06/23/2020   Mixed hearing loss 06/23/2020   Restrictive  lung disease 11/26/2019   Other specified hypothyroidism    Hyperlipidemia    Restless legs syndrome    History of TIA (transient ischemic attack) 12/11/2014   Carotid stenosis 11/24/2014   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   Migraine 04/02/2014   Atherosclerosis of native artery of extremity with intermittent claudication ([place]) 10/09/2013   PCP:  Merryl Hacker No Pharmacy:   [facility], [address] [place] [address] Phone: [phone] Fax: [facility], [address] [place] [address] Phone: [phone] Fax: [phone]  [facility] [address] [place] [address] Phone: [phone] Fax: [phone]     Social Determinants of Health (SDOH) Social History: SDOH Screenings   Food Insecurity: No Food Insecurity (04/05/2022)  Housing: Low Risk  (03/08/2022)  Transportation Needs: No Transportation Needs (04/05/2022)  Utilities: Not At Risk (03/08/2022)  Alcohol Screen: Low Risk  (12/27/2021)  Depression (PHQ2-9): Low Risk  (04/05/2022)  Financial Resource Strain: Low Risk  (08/26/2021)  Physical Activity: Inactive (08/26/2021)  Social Connections: Moderately Isolated (08/26/2021)  Stress: No Stress Concern Present (08/26/2021)  Tobacco Use: Medium Risk (04/09/2022)   SDOH Interventions:     Readmission Risk Interventions     No data to display

## 2022-04-12 NOTE — Progress Notes (Signed)
Speech Language Pathology Treatment: Dysphagia  Patient Details Name: James Richards MRN: [id number] DOB: [birth year] Today's Date: 04/12/2022 Time: 1537-1600 SLP Time Calculation (min) (ACUTE ONLY): 23 min  Assessment / Plan / Recommendation Clinical Impression  RN reports copious amounts of anterior spillage with purees and liquids. Overall, pt demonstrated improved mentation today and was more appropriate in his participation. Pt was observed with minimal trials of purees and thin liquids from his meal tray. There was a significant amount of oral residue following each trial of puree that was able to be cleared with mod verbal cues and a liquid wash, although no anterior loss observed. Pt quickly stated that he felt full and did not wish to continue with PO trials. He had a delayed cough following completion of all PO trials, but suspect this was due to SLP lowering head of bed. SLP elevated head of bed following his cough with no other s/s of aspiration. Will f/u to continue assessing appropriate diet and potential advancement for adequate nutrition. May want to consider dietitian and palliative care consults.    HPI HPI: Pt is a 77 yo male presenting to ED on 2/25 with AMS and possible seizures. CTH shows suspected hemorrhagic conversion of prior PCA territory stroke. Recent admission 0000000 for embolic CVA with residual L sided weakness. Pt has a history of cognitive-linguistic deficits, dysarthria,  and dysphagia with most recent evaluations completed on 04/02/22. PMH includes previous CVA x3, HTN, HLD, poor vision, extemely HOH, malignant neoplasm of the tongue and larynx s/p radiation, R lymph node dissection (2009) with recurrence in 2016 s/p partial glossectomy, lung disease      SLP Plan  Continue with current plan of care      Recommendations for follow up therapy are one component of a multi-disciplinary discharge planning process, led by the attending physician.  Recommendations may  be updated based on patient status, additional functional criteria and insurance authorization.    Recommendations  Diet recommendations: Dysphagia 1 (puree);Thin liquid Liquids provided via: Straw;Cup Medication Administration: Crushed with puree Supervision: Staff to assist with self feeding;Trained caregiver to feed patient;Full supervision/cueing for compensatory strategies Compensations: Minimize environmental distractions;Slow rate;Small sips/bites Postural Changes and/or Swallow Maneuvers: Seated upright 90 degrees;Upright 30-60 min after meal                Oral Care Recommendations: Oral care QID Follow Up Recommendations: Skilled nursing-short term rehab (<3 hours/day) Assistance recommended at discharge: Frequent or constant Supervision/Assistance SLP Visit Diagnosis: Dysphagia, unspecified (R13.10) Plan: Continue with current plan of care           Fabio Asa., Student SLP  04/12/2022, 4:26 PM

## 2022-04-12 NOTE — Progress Notes (Signed)
PROGRESS NOTE    James Richards  [id number] DOB: 1945-10-01 DOA: 04/09/2022 PCP: James Richards, No   Brief Narrative:  James Richards is a 77 y.o. male with a past medical history significant for recent stroke(discharged 1/23, and then again on 2/19 for recurrent strokes) malignancy workup negative, loop recorder placed at that time. Also carries history of chronic right PCA occlusion, right vertebral occlusion, left carotid occlusion, hypertension, hyperlipidemia, BMI 29, left CRAO, poor vision, extremely hard of hearing, malignant neoplasm of the tongue and larynx s/p surgery, peripheral artery disease, restless leg syndrome.  Patient readmitted for acute metabolic encephalopathy concerning for breakthrough seizure versus recurrent stroke in the setting of his recent history and multiple strokes.  Initially patient diagnosed with hemorrhagic conversion of ischemic stroke due to CT head however further evaluation reveals no hemorrhage noted on CT scan, he has not had 3 CT heads since admission with no overt bleeds or findings. MRI planned for later today given left-sided neglect and apparently new left upper extremity weakness(he was previously unable to cooperate or tolerate MRI).  Assessment & Plan:   Active Problems:   Acute embolic stroke (HCC)   Left hemiparesis (HCC)   Atherosclerosis of native artery of extremity with intermittent claudication (HCC)   Carotid stenosis   Hyperlipidemia   Restless legs syndrome   History of TIA (transient ischemic attack)   Hypertensive heart disease with chronic diastolic congestive heart failure (HCC)   Hearing loss of both ears due to cerumen impaction   Hypertensive heart disease with congestive heart failure and with combined systolic and diastolic dysfunction ([place])   Acute metabolic encephalopathy, unclear etiology  Rule out acute breakthrough seizure  Rule out acute, recurrent, CVA Rule out polypharmacy/withdrawal -Lengthy history as outlined  as above, recurrent strokes placing patient at high risk for repeat stroke as well as seizure. -Long-term EEG unremarkable for seizure, cortical dysfunction consistent with strokes -Loop recorder placed 04/05/2022, no reported events on interrogation -Continue Keppra -MRI pending 04/12/2022 for acute left-sided neglect and left upper extremity weakness versus depressed mental status(poorly interactive compared to previous per report) -Unclear etiology for acute recurrent strokes given previous hospitalizations and workup, appreciate neurology insight and recommendations with this difficult case -Patient previously on aspirin and Brilinta, *Of note patient was in [place] ER(Factor XIa inhibitor) trial at some point in January (unclear if he was able to continue or not) -No thrombotics now per Neurology - defer to their expertise  HTN, essential -Currently within permissive hypertension window per neurology -Continue home metoprolol at half dose  HLD -Continue statin, LDL at goal  BMI 29 -Will discussed dietary restrictions and exercise program with family once patient stable for discharge  Malignant neoplasm of tongue/larynx -History of, distant, treated 2016 -Malignancy workup last hospitalization unremarkable  CAD Peripheral arterial disease Peripheral vascular disease -Multiple arterial occlusions per history -Previously on aspirin and Brilinta as well as high intensity statin -defer to neurology for reinitiation or titration of these medications  DVT prophylaxis: Lovenox per neurology Code Status: DNR Family Communication: Called patient's POA x 2, James Richards -unable to leave voicemail  Status is: Patient  Dispo: The patient is from: Home              Anticipated d/c is to: To be determined              Anticipated d/c date is: 24 to 48 hours pending clinical course              Patient  currently not medically stable for discharge  Consultants:  Neurology  Procedures:   None  Antimicrobials:  None  Subjective: No acute issues or events overnight, patient minimally interactive today with nurse at bedside taking p.o. answering simple one-word answers somewhat appropriately, unable or unwilling to move left upper extremity.  Mental status continues to limit review of systems.   Objective: Vitals:   04/12/22 1100 04/12/22 1146 04/12/22 1200 04/12/22 1300  BP: (!) 149/82  137/67 (!) 154/74  Pulse: 61  60 61  Resp: '13  17 18  '$ Temp:  98.3 F (36.8 C)    TempSrc:  Axillary    SpO2: 97%  100% 100%  Weight:      Height:        Intake/Output Summary (Last 24 hours) at 04/12/2022 1403 Last data filed at 04/12/2022 1300 Gross per 24 hour  Intake 1059.55 ml  Output 450 ml  Net 609.55 ml   Filed Weights   04/09/22 1200  Weight: 79.4 kg    Examination:  General:  Pleasantly resting in bed, No acute distress.  Sporadically answering appropriately with single word answers, intermittently following commands HEENT:  Normocephalic atraumatic.  Sclerae nonicteric, noninjected.  Extraocular movements intact bilaterally. Neck:  Without mass or deformity.  Trachea is midline. Lungs:  Clear to auscultate bilaterally without rhonchi, wheeze, or rales. Heart:  Regular rate and rhythm.  Without murmurs, rubs, or gallops. Abdomen:  Soft, nontender, nondistended.  Without guarding or rebound. Extremities: Unable/unwilling to move left upper extremity.  Data Reviewed: I have personally reviewed following labs and imaging studies  CBC: Recent Labs  Lab 04/09/22 1203 04/09/22 1208 04/11/22 0331 04/12/22 0423  WBC 6.1  --  6.8 8.4  NEUTROABS 4.5  --   --   --   HGB 13.1 13.9 14.0 13.2  HCT 40.1 41.0 42.3 40.0  MCV 96.6  --  94.0 94.3  PLT 263  --  245 0000000   Basic Metabolic Panel: Recent Labs  Lab 04/09/22 1203 04/09/22 1208 04/11/22 0331 04/12/22 0423  NA 137 141 139 139  K 4.5 4.6 4.0 4.1  CL 101 101 107 105  CO2 30  --  22 22  GLUCOSE 95 93 91  88  BUN 20 28* 15 16  CREATININE 1.26* 1.20 1.02 0.94  CALCIUM 8.7*  --  9.0 9.1  MG  --   --  2.1 2.2  PHOS  --   --  2.8 2.5   GFR: Estimated Creatinine Clearance: 66 mL/min (by C-G formula based on SCr of 0.94 mg/dL). Liver Function Tests: Recent Labs  Lab 04/09/22 1203  AST 36  ALT 41  ALKPHOS 77  BILITOT 1.3*  PROT 5.9*  ALBUMIN 3.3*   No results for input(s): "LIPASE", "AMYLASE" in the last 168 hours. No results for input(s): "AMMONIA" in the last 168 hours. Coagulation Profile: Recent Labs  Lab 04/09/22 1203  INR 1.0   Cardiac Enzymes: No results for input(s): "CKTOTAL", "CKMB", "CKMBINDEX", "TROPONINI" in the last 168 hours. BNP (last 3 results) No results for input(s): "PROBNP" in the last 8760 hours. HbA1C: No results for input(s): "HGBA1C" in the last 72 hours. CBG: Recent Labs  Lab 04/09/22 1203 04/09/22 1234  GLUCAP 94 97   Lipid Profile: Recent Labs    04/11/22 0331  TRIG 81   Thyroid Function Tests: No results for input(s): "TSH", "T4TOTAL", "FREET4", "T3FREE", "THYROIDAB" in the last 72 hours. Anemia Panel: No results for input(s): "VITAMINB12", "FOLATE", "FERRITIN", "TIBC", "  IRON", "RETICCTPCT" in the last 72 hours. Sepsis Labs: No results for input(s): "PROCALCITON", "LATICACIDVEN" in the last 168 hours.  Recent Results (from the past 240 hour(s))  MRSA Next Gen by PCR, Nasal     Status: None   Collection Time: 04/09/22  1:18 PM   Specimen: Nasal Mucosa; Nasal Swab  Result Value Ref Range Status   MRSA by PCR Next Gen NOT DETECTED NOT DETECTED Final    Comment: (NOTE) The GeneXpert MRSA Assay (FDA approved for NASAL specimens only), is one component of a comprehensive MRSA colonization surveillance program. It is not intended to diagnose MRSA infection nor to guide or monitor treatment for MRSA infections. Test performance is not FDA approved in patients less than 13 years old. Performed at [facility], [place] [address]  [place], [place] [address]          Radiology Studies: CT HEAD WO CONTRAST (5MM)  Result Date: 04/11/2022 CLINICAL DATA:  Stroke, follow-up. EXAM: CT HEAD WITHOUT CONTRAST TECHNIQUE: Contiguous axial images were obtained from the base of the skull through the vertex without intravenous contrast. RADIATION DOSE REDUCTION: This exam was performed according to the departmental dose-optimization program which includes automated exposure control, adjustment of the mA and/or kV according to patient size and/or use of iterative reconstruction technique. COMPARISON:  Head CT 04/09/2022.  MRI brain 04/01/2022. FINDINGS: Brain: No acute intracranial hemorrhage. Unchanged chronic infarcts in the bilateral MCA territories. Unchanged lacunar infarcts in the left basal ganglia and thalamus. No hydrocephalus or extra-axial collection. No mass effect or midline shift. Vascular: No hyperdense vessel or unexpected calcification. Skull: No calvarial fracture or suspicious bone lesion. Skull base is unremarkable. Sinuses/Orbits: Unremarkable. Other: None. IMPRESSION: 1. No acute intracranial hemorrhage. 2. Unchanged chronic infarcts in the bilateral MCA territories. Unchanged lacunar infarcts in the left basal ganglia and thalamus. Electronically Signed   By: Emmit Alexanders M.D.   On: 04/11/2022 14:15    Scheduled Meds:  atorvastatin  80 mg Oral Daily   Chlorhexidine Gluconate Cloth  6 each Topical Q0600   clopidogrel  75 mg Oral Daily   cyanocobalamin  500 mcg Oral Daily   enoxaparin (LOVENOX) injection  40 mg Subcutaneous Q24H   fluticasone furoate-vilanterol  1 puff Inhalation Daily   haloperidol lactate  2 mg Intravenous Once   levothyroxine  125 mcg Oral Q0600   metoprolol tartrate  12.5 mg Oral BID   mouth rinse  15 mL Mouth Rinse 4 times per day   pantoprazole  40 mg Oral Daily   rOPINIRole  2 mg Oral QHS   senna-docusate  1 tablet Oral BID   Continuous Infusions:  sodium chloride 50 mL/hr at 04/10/22  1100   sodium chloride 75 mL/hr at 04/12/22 1200     LOS: 3 days   Time spent: 19mn  Jodel Mayhall C Shanta Hartner, DO [facility]  If 7PM-7AM, please contact night-coverage [email or web address]  04/12/2022, 2:03 PM

## 2022-04-12 NOTE — Progress Notes (Signed)
Physical Therapy Treatment Patient Details Name: James Richards MRN: [id number] DOB: [birth year] Today's Date: 04/12/2022   History of Present Illness 77 yo male presents to ED On 2/25 as code stroke for AMS, ? seizures. CTH shows suspected hemorrhagic conversion of his prior right PCA territory stroke. Recent admission 0000000 for embolic cva with residual L weakness and slurred speech. 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    PT Comments    Patient more lethargic and with more difficulty with L attention and keeping L hand on RW as unable to maintain grasp.   Patient taking increased time to respond to commands and questions and with less urgency noted as compared to last session.  Patient without appropriate level of assistance at d/c so plan now for SNF level rehab at d/c.  PT will continue to follow.   Recommendations for follow up therapy are one component of a multi-disciplinary discharge planning process, led by the attending physician.  Recommendations may be updated based on patient status, additional functional criteria and insurance authorization.  Follow Up Recommendations  Skilled nursing-short term rehab (<3 hours/day) Can patient physically be transported by private vehicle: No   Assistance Recommended at Discharge Frequent or constant Supervision/Assistance  Patient can return home with the following A little help with walking and/or transfers;A lot of help with bathing/dressing/bathroom;Direct supervision/assist for medications management;Assist for transportation;Help with stairs or ramp for entrance   Equipment Recommendations  None recommended by PT    Recommendations for Other Services       Precautions / Restrictions Precautions Precautions: Fall Precaution Comments: low  vision, posey     Mobility  Bed Mobility Overal bed mobility: Needs Assistance Bed Mobility: Supine to Sit     Supine to sit: Max assist, +2 for physical assistance, +2 for safety/equipment Sit to supine: Max assist, +2 for physical assistance   General bed mobility comments: assist for balance and lines    Transfers Overall transfer level: Needs assistance Equipment used: Rolling walker (2 wheels) Transfers: Sit to/from Stand Sit to Stand: Mod assist, +2 physical assistance, +2 safety/equipment           General transfer comment: mod to stand, max +2 for short mobility    Ambulation/Gait Ambulation/Gait assistance: Mod assist, +2 safety/equipment, Max assist Gait Distance (Feet): 12 Feet Assistive device: Rolling walker (2 wheels) Gait Pattern/deviations: Decreased stride length, Staggering left, Trunk flexed, Decreased step length - left       General Gait Details: staggering and pt needing assist to keep L hand on RW and with flexed head and neck, though corrects some in sitting with mod cues.   Stairs             Wheelchair Mobility    Modified Rankin (Stroke Patients Only)       Balance Overall balance assessment: Needs assistance Sitting-balance support: No upper extremity supported, Feet supported Sitting balance-Leahy Scale: Poor Sitting balance - Comments: R lateral lean   Standing balance support: Bilateral upper extremity supported Standing balance-Leahy Scale: Poor                              Cognition Arousal/Alertness: Awake/alert, Lethargic Behavior During Therapy: Flat affect Overall Cognitive Status: Impaired/Different from baseline Area of Impairment: Orientation, Attention, Memory, Following commands, Safety/judgement, Awareness,  Problem solving                 Orientation Level: Disoriented to, Time, Situation Current Attention Level: Focused Memory: Decreased recall of precautions, Decreased short-term  memory Following Commands: Follows one step commands inconsistently Safety/Judgement: Decreased awareness of deficits, Decreased awareness of safety Awareness: Intellectual Problem Solving: Slow processing, Decreased initiation, Difficulty sequencing, Requires verbal cues General Comments: needs 3-5 seconds to process priror to following commands or answering questions. constant cues for re-direction, initiation and sequencing needed. L inattention. low arousal throughout.        Exercises      General Comments General comments (skin integrity, edema, etc.): SBP 188 prior to session, 183 during mobility, 155 in chair, RN aware      Pertinent Vitals/Pain Pain Assessment Pain Assessment: Faces Faces Pain Scale: No hurt    Home Living                          Prior Function            PT Goals (current goals can now be found in the care plan section) Progress towards PT goals: Not progressing toward goals - comment    Frequency    Min 3X/week      PT Plan Discharge plan needs to be updated;Frequency needs to be updated    Co-evaluation PT/OT/SLP Co-Evaluation/Treatment: Yes Reason for Co-Treatment: For patient/therapist safety;To address functional/ADL transfers PT goals addressed during session: Mobility/safety with mobility;Balance        AM-PAC PT "6 Clicks" Mobility   Outcome Measure  Help needed turning from your back to your side while in a flat bed without using bedrails?: A Little Help needed moving from lying on your back to sitting on the side of a flat bed without using bedrails?: A Little Help needed moving to and from a bed to a chair (including a wheelchair)?: A Lot Help needed standing up from a chair using your arms (e.g., wheelchair or bedside chair)?: A Lot Help needed to walk in hospital room?: Total Help needed climbing 3-5 steps with a railing? : Total 6 Click Score: 12    End of Session Equipment Utilized During Treatment: Gait  belt Activity Tolerance: Patient limited by fatigue Patient left: in chair;with call bell/phone within reach;with chair alarm set Nurse Communication: Mobility status PT Visit Diagnosis: Other abnormalities of gait and mobility (R26.89);Unsteadiness on feet (R26.81)     Time: [id number] PT Time Calculation (min) (ACUTE ONLY): 25 min  Charges:  $Gait Training: 8-22 mins                     Magda Kiel, PT Acute Rehabilitation Services Office:[phone] 04/12/2022    Reginia Naas 04/12/2022, 5:58 PM

## 2022-04-12 NOTE — Progress Notes (Signed)
Inpatient Rehab Admissions Coordinator:   I spoke with Pt.'s cousin [name] who is his POA and takes care of him. She reports that she can provide supervision and help with household chores, but states she is 31 and cannot provide much physical assistance. I do not anticipate Pt. Will be supervision level at the end of CIR and he would not be able to go SNF after CIR with his insurance.  I discussed SNF rehab with [name] and she is amenable to this. I will let TOC know and CIR will sign off.   Clemens Catholic, [place], [place] Admissions Coordinator  [phone] ([place]) [phone] (office)

## 2022-04-12 NOTE — NC FL2 (Signed)
[place] LEVEL OF CARE FORM     IDENTIFICATION  Patient Name: James Richards Birthdate: 03-04-1945 Sex: male Admission Date (Current Location): 04/09/2022  [facility] and [place] Number:  [employer] and Address:  [facility] [place], [place] [address] [place], [place] [address]      Provider Number: [id number]  Attending Physician Name and Address:  Little Ishikawa, MD  Relative Name and Phone Number:       Current Level of Care: Hospital Recommended Level of Care: [place] Prior Approval Number:    Date Approved/Denied:   PASRR Number: [id number]  Discharge Plan: SNF    Current Diagnoses: Patient Active Problem List   Diagnosis Date Noted   Acute embolic stroke ([place]) [id number]   Middle cerebral artery stenosis, right 03/19/2022   Left hemiparesis ([place]) 03/06/2022   Hypertensive heart disease with congestive heart failure and with combined systolic and diastolic dysfunction ([place]) 03/06/2022   Middle cerebral artery embolism, left 03/06/2022   Hearing loss of both ears due to cerumen impaction 08/24/2021   Otitis media, chronic with perforation 07/27/2021   Otitis media, purulent, acute, with spontaneous rupture of TM 07/27/2021   Central retinal artery occlusion of left eye 03/31/2021   Occlusion of extracranial carotid artery, left 03/31/2021   Hypertensive heart disease with chronic diastolic congestive heart failure ([place]) 06/23/2020   Mixed hearing loss 06/23/2020   Restrictive lung disease 11/26/2019   Other specified hypothyroidism    Hyperlipidemia    Restless legs syndrome    History of TIA (transient ischemic attack) 12/11/2014   Carotid stenosis 11/24/2014   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   Migraine 04/02/2014   Atherosclerosis of native artery of extremity with intermittent claudication ([place]) 10/09/2013    Orientation RESPIRATION BLADDER Height & Weight     Self  Normal  Incontinent, Indwelling catheter Weight: 175 lb 0.7 oz (79.4 kg) Height:  '5\' 5"'$  (165.1 cm)  BEHAVIORAL SYMPTOMS/MOOD NEUROLOGICAL BOWEL NUTRITION STATUS      Continent Diet (See dc summary)  AMBULATORY STATUS COMMUNICATION OF NEEDS Skin   Extensive Assist Verbally Normal                       Personal Care Assistance Level of Assistance  Bathing, Feeding, Dressing Bathing Assistance: Maximum assistance Feeding assistance: Limited assistance Dressing Assistance: Maximum assistance     Functional Limitations Info  Hearing   Hearing Info: Impaired      SPECIAL CARE FACTORS FREQUENCY  PT (By licensed PT), OT (By licensed OT)     PT Frequency: Eval and treat OT Frequency: Eval and treat            Contractures Contractures Info: Not present    Additional Factors Info  Code Status, Allergies Code Status Info: DNR Allergies Info: NKA           Current Medications (04/12/2022):  This is the current hospital active medication list Current Facility-Administered Medications  Medication Dose Route Frequency Provider Last Rate Last Admin   0.9 %  sodium chloride infusion   Intravenous PRN Bhagat, Srishti L, MD 50 mL/hr at 04/10/22 1100 Infusion Verify at 04/10/22 1100   0.9 %  sodium chloride infusion   Intravenous Continuous Little Ishikawa, MD 75 mL/hr at 04/12/22 1200 Infusion Verify at 04/12/22 1200   acetaminophen (TYLENOL) tablet 650 mg  650 mg Oral Q4H PRN Lorenza Chick, MD  Or   acetaminophen (TYLENOL) 160 MG/5ML solution 650 mg  650 mg Per Tube Q4H PRN Bhagat, Srishti L, MD       Or   acetaminophen (TYLENOL) suppository 650 mg  650 mg Rectal Q4H PRN Bhagat, Srishti L, MD       atorvastatin (LIPITOR) tablet 80 mg  80 mg Oral Daily Bhagat, Srishti L, MD   80 mg at 04/12/22 1002   Chlorhexidine Gluconate Cloth 2 % PADS 6 each  6 each Topical Q0600 Bhagat, Srishti L, MD   6 each at 04/12/22 [id number]   clopidogrel (PLAVIX) tablet 75 mg  75 mg Oral Daily  Lovey Newcomer C, NP   75 mg at 04/12/22 1002   cyanocobalamin (VITAMIN B12) tablet 500 mcg  500 mcg Oral Daily Bhagat, Srishti L, MD   500 mcg at 04/12/22 1008   enoxaparin (LOVENOX) injection 40 mg  40 mg Subcutaneous Q24H Leonie Man, Pramod S, MD   40 mg at 04/12/22 1253   fluticasone furoate-vilanterol (BREO ELLIPTA) 200-25 MCG/ACT 1 puff  1 puff Inhalation Daily Bhagat, Srishti L, MD       haloperidol lactate (HALDOL) injection 2 mg  2 mg Intravenous Once Little Ishikawa, MD       ipratropium-albuterol (DUONEB) 0.5-2.5 (3) MG/3ML nebulizer solution 3 mL  3 mL Nebulization Q4H PRN Bhagat, Srishti L, MD       levothyroxine (SYNTHROID) tablet 125 mcg  125 mcg Oral Q0600 Bhagat, Srishti L, MD   125 mcg at 04/12/22 0526   metoprolol tartrate (LOPRESSOR) tablet 12.5 mg  12.5 mg Oral BID Bhagat, Srishti L, MD   12.5 mg at 04/12/22 1002   Oral care mouth rinse  15 mL Mouth Rinse 4 times per day Lesleigh Noe L, MD   15 mL at 04/12/22 [id number]   Oral care mouth rinse  15 mL Mouth Rinse PRN Bhagat, Srishti L, MD       pantoprazole (PROTONIX) EC tablet 40 mg  40 mg Oral Daily Esmeralda Arthur W, RPH       polyvinyl alcohol (LIQUIFILM TEARS) 1.4 % ophthalmic solution 1 drop  1 drop Both Eyes PRN Bhagat, Srishti L, MD       rOPINIRole (REQUIP) tablet 2 mg  2 mg Oral QHS Bhagat, Srishti L, MD   2 mg at 04/11/22 2143   senna-docusate (Senokot-S) tablet 1 tablet  1 tablet Oral BID Bhagat, Srishti L, MD   1 tablet at 04/12/22 1002     Discharge Medications: Please see discharge summary for a list of discharge medications.  Relevant Imaging Results:  Relevant Lab Results:   Additional Information SSN: [place]ington Arp, [place]

## 2022-04-12 NOTE — Progress Notes (Signed)
Occupational Therapy Treatment Patient Details Name: NORM MARBLE MRN: [id number] DOB: 1946-01-19 Today's Date: 04/12/2022   History of present illness 77 yo male presents to ED On 2/25 as code stroke for AMS, ? seizures. CTH shows suspected hemorrhagic conversion of his prior right PCA territory stroke. Recent admission 0000000 for embolic cva with residual L weakness and slurred speech. 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   OT comments  Pt seen with PT to safely progress towards acute OT goals. Pt is making limited progress towards their acute OT goals. He required mod-max A +2 for all mobility to manage L hemi body, balance, impaired cognition and L inattention. Noted LUE was flaccid throughout and required hand over hand to grasp RW. Pt also needed 3-5 seconds for processing information for all command following. BP slightly elevated during functional activity, RN notified. OT to continue to follow acutely. Discharge recommendation remains appropriate.    Recommendations for follow up therapy are one component of a multi-disciplinary discharge planning process, led by the attending physician.  Recommendations may be updated based on patient status, additional functional criteria and insurance authorization.    Follow Up Recommendations  Acute inpatient rehab (3hours/day)     Assistance Recommended at Discharge Frequent or constant Supervision/Assistance  Patient can return home with the following  A little help with walking and/or transfers;A little help with bathing/dressing/bathroom;Assistance with cooking/housework;Direct supervision/assist for medications management;Direct supervision/assist for financial management;Assist for transportation;Help with stairs or ramp for entrance   Equipment  Recommendations  Other (comment)       Precautions / Restrictions Precautions Precautions: Fall Precaution Comments: low vision, posey Restrictions Weight Bearing Restrictions: No       Mobility Bed Mobility Overal bed mobility: Needs Assistance Bed Mobility: Supine to Sit     Supine to sit: Max assist, +2 for physical assistance, +2 for safety/equipment          Transfers Overall transfer level: Needs assistance Equipment used: Rolling walker (2 wheels) Transfers: Sit to/from Stand Sit to Stand: Mod assist, +2 physical assistance, +2 safety/equipment           General transfer comment: mod to stand, max +2 for short mobility     Balance Overall balance assessment: Needs assistance Sitting-balance support: No upper extremity supported, Feet supported Sitting balance-Leahy Scale: Poor Sitting balance - Comments: R lateral lean   Standing balance support: Bilateral upper extremity supported Standing balance-Leahy Scale: Poor                             ADL either performed or assessed with clinical judgement   ADL Overall ADL's : Needs assistance/impaired                         Toilet Transfer: Maximal assistance;+2 for physical assistance;+2 for safety/equipment;Ambulation;Rolling walker (2 wheels) Toilet Transfer Details (indicate cue type and reason): R lateral lean, L hemi deficits, cognition, vision         Functional mobility during ADLs: Maximal assistance;+2 for safety/equipment;+2 for physical assistance General ADL Comments: L hemi body deficits seemingly worsened this date    Extremity/Trunk Assessment Upper Extremity Assessment Upper Extremity Assessment: LUE deficits/detail LUE Deficits / Details: flaccid this date, edematous, seemingly no sensation with light touch or noxious LUE  Sensation: decreased light touch;decreased proprioception LUE Coordination: decreased fine motor;decreased gross motor   Lower Extremity  Assessment Lower Extremity Assessment: Defer to PT evaluation        Vision   Vision Assessment?: Yes Eye Alignment: Within Functional Limits Ocular Range of Motion: Within Functional Limits Alignment/Gaze Preference: Within Defined Limits Tracking/Visual Pursuits: Impaired - to be further tested in functional context Saccades: Decreased speed of saccadic movement;Undershoots;Overshoots Visual Fields: No apparent deficits Additional Comments: continues to not track stimuli, will briefly attend on the right, came to midline with maximal cues, did not cross over to the L   Perception Perception Perception: Impaired   Praxis Praxis Praxis: Impaired    Cognition Arousal/Alertness: Awake/alert Behavior During Therapy: Flat affect Overall Cognitive Status: Impaired/Different from baseline Area of Impairment: Orientation, Attention, Memory, Following commands, Safety/judgement, Awareness, Problem solving                 Orientation Level: Disoriented to, Time, Situation Current Attention Level: Focused Memory: Decreased recall of precautions, Decreased short-term memory Following Commands: Follows one step commands inconsistently Safety/Judgement: Decreased awareness of deficits, Decreased awareness of safety Awareness: Intellectual Problem Solving: Slow processing, Decreased initiation, Difficulty sequencing, Requires verbal cues General Comments: needs 3-5 seconds to process priror to following commands or answering questions. constant cues for re-direction, initiation and sequencing needed. L inattention. low arousal throughout.              General Comments Systolic pressure 0000000 prior to session, 183 during mobility and 155 once sitting in chair. RN notified.    Pertinent Vitals/ Pain       Pain Assessment Pain Assessment: Faces Faces Pain Scale: No hurt Pain Intervention(s): Monitored during session   Frequency  Min 2X/week        Progress Toward  Goals  OT Goals(current goals can now be found in the care plan section)  Progress towards OT goals: Not progressing toward goals - comment  Acute Rehab OT Goals Patient Stated Goal: did not state OT Goal Formulation: Patient unable to participate in goal setting Time For Goal Achievement: 04/24/22 Potential to Achieve Goals: Good ADL Goals Pt Will Perform Grooming: with min assist;sitting Pt Will Perform Upper Body Dressing: with min assist;sitting Pt Will Transfer to Toilet: with min assist;ambulating;bedside commode Pt Will Perform Toileting - Clothing Manipulation and hygiene: Independently;sitting/lateral leans;sit to/from stand Additional ADL Goal #1: Pt will visual scan to locate 3/4 ADL tools during task with Min cues Additional ADL Goal #2: Pt will demonstrate sustained attention during ADL task with Min cues  Plan Discharge plan remains appropriate;Frequency remains appropriate    Co-evaluation    PT/OT/SLP Co-Evaluation/Treatment: Yes Reason for Co-Treatment: For patient/therapist safety;To address functional/ADL transfers   OT goals addressed during session: ADL's and self-care      AM-PAC OT "6 Clicks" Daily Activity     Outcome Measure   Help from another person eating meals?: A Lot Help from another person taking care of personal grooming?: A Lot Help from another person toileting, which includes using toliet, bedpan, or urinal?: Total Help from another person bathing (including washing, rinsing, drying)?: A Lot Help from another person to put on and taking off regular upper body clothing?: A Lot Help from another person to put on and taking off regular lower body clothing?: Total 6 Click Score: 10    End of Session Equipment Utilized During Treatment: Gait belt  OT Visit Diagnosis: Unsteadiness on feet (R26.81);Other abnormalities of gait and mobility (R26.89);Muscle weakness (generalized) (  M62.81);Low vision, both eyes (H54.2);Other symptoms and signs  involving cognitive function   Activity Tolerance Patient limited by lethargy   Patient Left in chair;with call bell/phone within reach;with restraints reapplied   Nurse Communication Mobility status        Time: [id number] OT Time Calculation (min): 25 min  Charges: OT General Charges $OT Visit: 1 Visit OT Treatments $Therapeutic Activity: 8-22 mins  Shade Flood, OTR/L [place] Office [phone] Secure Chat Communication Preferred   Elliot Cousin 04/12/2022, 1:06 PM

## 2022-04-13 DIAGNOSIS — I639 Cerebral infarction, unspecified: Secondary | ICD-10-CM

## 2022-04-13 DIAGNOSIS — I609 Nontraumatic subarachnoid hemorrhage, unspecified: Secondary | ICD-10-CM | POA: Diagnosis not present

## 2022-04-13 LAB — BASIC METABOLIC PANEL
Anion gap: 8 (ref 5–15)
BUN: 14 mg/dL (ref 8–23)
CO2: 21 mmol/L — ABNORMAL LOW (ref 22–32)
Calcium: 8.5 mg/dL — ABNORMAL LOW (ref 8.9–10.3)
Chloride: 111 mmol/L (ref 98–111)
Creatinine, Ser: 0.94 mg/dL (ref 0.61–1.24)
GFR, Estimated: >60 mL/min (ref >60)
Glucose, Bld: 69 mg/dL — ABNORMAL LOW (ref 70–99)
Potassium: 3.5 mmol/L (ref 3.5–5.1)
Sodium: 140 mmol/L (ref 135–145)

## 2022-04-13 LAB — CBC
HCT: 36.7 % — ABNORMAL LOW (ref 39.0–52.0)
Hemoglobin: 12.3 g/dL — ABNORMAL LOW (ref 13.0–17.0)
MCH: 31.5 pg (ref 26.0–34.0)
MCHC: 33.5 g/dL (ref 30.0–36.0)
MCV: 93.9 fL (ref 80.0–100.0)
Platelets: 236 10*3/uL (ref 150–400)
RBC: 3.91 MIL/uL — ABNORMAL LOW (ref 4.22–5.81)
RDW: 14.6 % (ref 11.5–15.5)
WBC: 6.3 10*3/uL (ref 4.0–10.5)
nRBC: 0 % (ref 0.0–0.2)

## 2022-04-13 LAB — PHOSPHORUS: Phosphorus: 2.3 mg/dL — ABNORMAL LOW (ref 2.5–4.6)

## 2022-04-13 LAB — MAGNESIUM: Magnesium: 1.9 mg/dL (ref 1.7–2.4)

## 2022-04-13 MED ORDER — ASPIRIN 81 MG PO CHEW
81.0000 mg | CHEWABLE_TABLET | Freq: Every day | ORAL | Status: DC
  Administered 2022-04-13 – 2022-04-14 (×2): 81 mg via ORAL
  Filled 2022-04-13 (×2): qty 1

## 2022-04-13 MED ORDER — ASPIRIN 81 MG PO TBEC: 81.0000 mg | DELAYED_RELEASE_TABLET | Freq: Every day | ORAL | Status: DC

## 2022-04-13 MED ORDER — GADOBUTROL 1 MMOL/ML IV SOLN
8.0000 mL | Freq: Once | INTRAVENOUS | Status: AC | PRN
  Administered 2022-04-13: 8 mL via INTRAVENOUS

## 2022-04-13 MED ORDER — HYDRALAZINE HCL 20 MG/ML IJ SOLN: 10.0000 mg | Freq: Four times a day (QID) | INTRAMUSCULAR | Status: DC | PRN

## 2022-04-13 NOTE — Progress Notes (Signed)
PROGRESS NOTE  James Richards [id number] DOB: 12/16/1945 DOA: 04/09/2022 PCP: Pcp, No   LOS: 4 days   Brief Narrative / Interim history: James Richards is a 77 y.o. male with a past medical history significant for recent stroke(discharged 1/23, and then again on 2/19 for recurrent strokes) malignancy workup negative, loop recorder placed at that time. Also carries history of chronic right PCA occlusion, right vertebral occlusion, left carotid occlusion, hypertension, hyperlipidemia, BMI 29, left CRAO, poor vision, extremely hard of hearing, malignant neoplasm of the tongue and larynx s/p surgery, peripheral artery disease, restless leg syndrome. Patient readmitted for acute metabolic encephalopathy concerning for breakthrough seizure versus recurrent stroke in the setting of his recent history and multiple strokes.  Subjective / 24h Interval events: Somewhat lethargic this morning, does not interact much no overnight events  Assesement and Plan: Active Problems:   Acute embolic stroke (HCC)   Left hemiparesis (HCC)   Atherosclerosis of native artery of extremity with intermittent claudication (HCC)   Carotid stenosis   Hyperlipidemia   Restless legs syndrome   History of TIA (transient ischemic attack)   Hypertensive heart disease with chronic diastolic congestive heart failure (HCC)   Hearing loss of both ears due to cerumen impaction   Hypertensive heart disease with congestive heart failure and with combined systolic and diastolic dysfunction ([place])   Principal problem Acute CVA -MRI done 2/27 showed scattered multifocal ischemic infarcts the cortical and subcortical aspect of both cerebral hemisphere as well as left cerebellum.  Some of these are new and some are subacute.  MRI also showed associated mild scattered potential blood products without frank hemorrhagic transformation.  Patient was recently hospitalized and discharged 0000000 for embolic strokes.  He apparently had a  loop recorder but there were no significant events.  Neurology consulted and following, for now continue Plavix, statin.  Just underwent full stroke workup 4 weeks ago  Active problems Acute metabolic encephalopathy-due to #1.  Essential hypertension-on metoprolol  Hyperlipidemia-continue statin  CAD, PAD -with multiple arterial occlusions, on Plavix now  Paroxysmal SVT -continue metoprolol.  2D echo done on March 07, 2022 showed LVEF 60-65%, no WMA, grade 2 diastolic dysfunction.  RV was normal  Hypothyroidism-continue Synthroid  RLS-continue Requip    Scheduled Meds:  atorvastatin  80 mg Oral Daily   Chlorhexidine Gluconate Cloth  6 each Topical Q0600   clopidogrel  75 mg Oral Daily   cyanocobalamin  500 mcg Oral Daily   enoxaparin (LOVENOX) injection  40 mg Subcutaneous Q24H   fluticasone furoate-vilanterol  1 puff Inhalation Daily   levothyroxine  125 mcg Oral Q0600   metoprolol tartrate  12.5 mg Oral BID   mouth rinse  15 mL Mouth Rinse 4 times per day   pantoprazole  40 mg Oral Daily   rOPINIRole  2 mg Oral QHS   senna-docusate  1 tablet Oral BID   Continuous Infusions:  sodium chloride 50 mL/hr at 04/10/22 1100   sodium chloride 75 mL/hr at 04/13/22 0700   PRN Meds:.sodium chloride, acetaminophen **OR** acetaminophen (TYLENOL) oral liquid 160 mg/5 mL **OR** acetaminophen, ipratropium-albuterol, mouth rinse, polyvinyl alcohol  Current Outpatient Medications  Medication Instructions   acetaminophen-codeine (TYLENOL #3) 300-30 MG tablet TAKE 1 TABLET EVERY 4 HOURS AS NEEDED FOR MODERATE PAIN   albuterol (VENTOLIN HFA) 108 (90 Base) MCG/ACT inhaler 1 puff, Inhalation, As needed   ascorbic acid (VITAMIN C) 500 mg, Oral, Daily   Aspirin Low Dose 81 mg, Oral, Daily, Swallow whole. FOR  ONLY ONE MONTH.   atorvastatin (LIPITOR) 80 MG tablet TAKE 1 TABLET EVERY DAY   Brilinta 90 mg, Oral, 2 times daily   [START ON 05/03/2022] clopidogrel (PLAVIX) 75 mg, Oral, Daily, TO BE  STARTED ON 05/03/22.   furosemide (LASIX) 40 mg, Oral, Every other day   levothyroxine (SYNTHROID) 125 mcg, Oral, Daily   metoprolol tartrate (LOPRESSOR) 25 MG tablet Take 1/2 tablet (12.5 mg total) by mouth 2 (two) times daily.   minocycline (MINOCIN) 50 mg, Oral, Daily   Naphazoline HCl (CLEAR EYES OP) 1-2 drops, Both Eyes, As needed   Omega-3 Fatty Acids (FISH OIL) 1200 MG CPDR 1 capsule, Oral, Daily   pantoprazole (PROTONIX) 40 mg, Oral, Daily   Potassium Bicarbonate 99 mg, Oral, Daily   rOPINIRole (REQUIP) 2 mg, Oral, Daily at bedtime   SYMBICORT 160-4.5 MCG/ACT inhaler 2 puffs, Inhalation, Daily   vitamin B-12 (CYANOCOBALAMIN) 500 mcg, Oral, Daily   zinc gluconate 50 mg, Oral, Daily    Diet Orders (From admission, onward)     Start     Ordered   04/11/22 1404  DIET - DYS 1 Room service appropriate? No; Fluid consistency: Thin  Diet effective now       Comments: Full supervision  Question Answer Comment  Room service appropriate? No   Fluid consistency: Thin      04/11/22 1403            DVT prophylaxis: enoxaparin (LOVENOX) injection 40 mg Start: 04/12/22 1130 SCD's Start: 04/09/22 1239   Lab Results  Component Value Date   PLT 236 04/13/2022      Code Status: DNR  Family Communication: no family at bedside   Status is: Inpatient  Remains inpatient appropriate because: severity of illness  Level of care: Med-Surg  Consultants:  Neurology   Objective: Vitals:   04/13/22 0500 04/13/22 0600 04/13/22 0700 04/13/22 0800  BP: (!) 173/91 (!) 150/67 (!) 141/66   Pulse: 61 (!) 56 (!) 58   Resp: 13 (!) 9 13   Temp:    97.9 F (36.6 C)  TempSrc:    Axillary  SpO2: 95% 96% 97%   Weight:      Height:        Intake/Output Summary (Last 24 hours) at 04/13/2022 [id number] Last data filed at 04/13/2022 0700 Gross per 24 hour  Intake 1489.83 ml  Output 675 ml  Net 814.83 ml   Wt Readings from Last 3 Encounters:  04/09/22 79.4 kg  04/02/22 83.8 kg  03/14/22  80.7 kg    Examination:  Constitutional: NAD Eyes: no scleral icterus ENMT: Mucous membranes are moist.  Neck: normal, supple Respiratory: clear to auscultation bilaterally, no wheezing, no crackles. Normal respiratory effort. No accessory muscle use.  Cardiovascular: Regular rate and rhythm, no murmurs / rubs / gallops. No LE edema.  Abdomen: non distended, no tenderness. Bowel sounds positive.  Musculoskeletal: no clubbing / cyanosis.    Data Reviewed: I have independently reviewed following labs and imaging studies   CBC Recent Labs  Lab 04/09/22 1203 04/09/22 1208 04/11/22 0331 04/12/22 0423 04/13/22 0405  WBC 6.1  --  6.8 8.4 6.3  HGB 13.1 13.9 14.0 13.2 12.3*  HCT 40.1 41.0 42.3 40.0 36.7*  PLT 263  --  245 258 236  MCV 96.6  --  94.0 94.3 93.9  MCH 31.6  --  31.1 31.1 31.5  MCHC 32.7  --  33.1 33.0 33.5  RDW 14.9  --  14.4 14.8 14.6  LYMPHSABS 0.7  --   --   --   --   MONOABS 0.6  --   --   --   --   EOSABS 0.3  --   --   --   --   BASOSABS 0.0  --   --   --   --     Recent Labs  Lab 04/09/22 1203 04/09/22 1208 04/11/22 0331 04/12/22 0423 04/13/22 0405  NA 137 141 139 139 140  K 4.5 4.6 4.0 4.1 3.5  CL 101 101 107 105 111  CO2 30  --  22 22 21*  GLUCOSE 95 93 91 88 69*  BUN 20 28* '15 16 14  '$ CREATININE 1.26* 1.20 1.02 0.94 0.94  CALCIUM 8.7*  --  9.0 9.1 8.5*  AST 36  --   --   --   --   ALT 41  --   --   --   --   ALKPHOS 77  --   --   --   --   BILITOT 1.3*  --   --   --   --   ALBUMIN 3.3*  --   --   --   --   MG  --   --  2.1 2.2 1.9  INR 1.0  --   --   --   --     ------------------------------------------------------------------------------------------------------------------ Recent Labs    04/11/22 0331  TRIG 81    Lab Results  Component Value Date   HGBA1C 5.7 (H) 03/06/2022   ------------------------------------------------------------------------------------------------------------------ No results for input(s): "TSH",  "T4TOTAL", "T3FREE", "THYROIDAB" in the last 72 hours.  Invalid input(s): "FREET3"  Cardiac Enzymes No results for input(s): "CKMB", "TROPONINI", "MYOGLOBIN" in the last 168 hours.  Invalid input(s): "CK" ------------------------------------------------------------------------------------------------------------------    Component Value Date/Time   BNP 31.4 10/08/2019 1000    CBG: Recent Labs  Lab 04/09/22 1203 04/09/22 1234  GLUCAP 94 97    Recent Results (from the past 240 hour(s))  MRSA Next Gen by PCR, Nasal     Status: None   Collection Time: 04/09/22  1:18 PM   Specimen: Nasal Mucosa; Nasal Swab  Result Value Ref Range Status   MRSA by PCR Next Gen NOT DETECTED NOT DETECTED Final    Comment: (NOTE) The GeneXpert MRSA Assay (FDA approved for NASAL specimens only), is one component of a comprehensive MRSA colonization surveillance program. It is not intended to diagnose MRSA infection nor to guide or monitor treatment for MRSA infections. Test performance is not FDA approved in patients less than 76 years old. Performed at [facility], [place] [address] [place], [place] [address]      Radiology Studies: MR BRAIN W WO CONTRAST  Result Date: 04/13/2022 CLINICAL DATA:  Initial evaluation for TIA. EXAM: MRI HEAD WITHOUT AND WITH CONTRAST TECHNIQUE: Multiplanar, multiecho pulse sequences of the brain and surrounding structures were obtained without and with intravenous contrast. CONTRAST:  54m GADAVIST GADOBUTROL 1 MMOL/ML IV SOLN COMPARISON:  Prior CT from 04/11/2022 and MRI from 04/01/2022. FINDINGS: Brain: Diffuse prominence of the CSF containing spaces compatible with generalized cerebral atrophy. Patchy T2/FLAIR hyperintensity involving the periventricular and deep white matter both cerebral hemispheres, consistent with chronic small vessel ischemic disease. Multiple remote infarcts involving the bilateral cerebral hemispheres again noted. Scattered remote lacunar  infarcts about the left greater than right basal ganglia and thalami. Small remote pontine and left cerebellar infarcts noted. Scattered multifocal restricted diffusion seen involving the  cortical and subcortical aspect of both cerebral hemispheres, consistent with evolving ischemic infarcts. While some of these are subacute in nature related to previously identified infarcts seen on prior brain MRI from 03/22/2022, there are multiple new areas of acute infarction seen since prior exam. These are most pronounced at the right frontal operculum (series 5, image 90), bilateral occipital lobes (series 5, image 76), and right temporal lobe (series 5, image 75). Patchy involvement of the left greater than right basal ganglia of and splenium. New small acute ischemic left cerebellar infarct (series 5, image 69). Associated mild scattered petechial blood products within the right frontal and occipital lobes (series 11, images 34, 24). No frank hemorrhagic transformation or significant regional mass effect. No mass lesion or midline shift. Ventricles within normal limits without hydrocephalus. No extra-axial fluid collection. Pituitary gland and suprasellar region within normal limits. Mild enhancement noted about the evolving subacute right MCA territory infarct involving the posterior right frontal region (series 19, image 14). No other abnormal enhancement. Vascular: Loss of normal flow void within the left ICA to the terminus, consistent with known chronic occlusion. Major intracranial vascular flow voids are otherwise maintained. Skull and upper cervical spine: Craniocervical junction within normal limits. Bone marrow signal intensity normal. No scalp soft tissue abnormality. Sinuses/Orbits: Prior bilateral ocular lens replacement. Left gaze preference noted. Scattered mucosal thickening noted about the ethmoidal air cells and maxillary sinuses. Paranasal sinuses are otherwise clear. Trace bilateral mastoid effusions  noted, of doubtful significance. Other: None. IMPRESSION: 1. Scattered multifocal ischemic infarcts involving the cortical and subcortical aspect of both cerebral hemispheres as well as the left cerebellum. While some of these are subacute in nature related to the previously identified infarcts seen on prior brain MRI from 03/22/2022, there are multiple new areas of acute infarction, most notably at the right frontal operculum, bilateral occipital lobes, and right temporal lobe. Associated mild scattered petechial blood products without lobes without frank hemorrhagic transformation or significant regional mass effect. 2. Underlying age-related cerebral atrophy with chronic small vessel ischemic disease, with multiple remote infarcts as above. Electronically Signed   By: Jeannine Boga M.D.   On: 04/13/2022 02:03     Marzetta Board, MD, PhD [facility]  Between 7 am - 7 pm I am available, please contact me via Amion (for emergencies) or Securechat (non urgent messages)  Between 7 pm - 7 am I am not available, please contact night coverage MD/APP via Amion

## 2022-04-13 NOTE — Care Management (Addendum)
SBP Goal<200 per Md Sethi.  Daisey Must, RN

## 2022-04-13 NOTE — Progress Notes (Addendum)
STROKE TEAM PROGRESS NOTE   INTERVAL HISTORY No family at the bedside.  No new neurological events overnight. VSS, Labs WNL.  Patient appears to be more alert and interactive y this morning.  He remains disoriented and confused.  He can follow simple commands and answer some questions.  He is able to move all 4 extremities against gravity.  MRI scan of the brain done last night confirms multiple new bicerebral infarcts Vitals:   04/13/22 1000 04/13/22 1100 04/13/22 1122 04/13/22 1200  BP: (!) 188/90 (!) 196/114 (!) 196/114 (!) 159/85  Pulse: 69 77 81 83  Resp: '17 19  18  '$ Temp:    (!) 97.4 F (36.3 C)  TempSrc:    Axillary  SpO2: 98% 98%  97%  Weight:      Height:       CBC:  Recent Labs  Lab 04/09/22 1203 04/09/22 1208 04/12/22 0423 04/13/22 0405  WBC 6.1   < > 8.4 6.3  NEUTROABS 4.5  --   --   --   HGB 13.1   < > 13.2 12.3*  HCT 40.1   < > 40.0 36.7*  MCV 96.6   < > 94.3 93.9  PLT 263   < > 258 236   < > = values in this interval not displayed.   Basic Metabolic Panel:  Recent Labs  Lab 04/12/22 0423 04/13/22 0405  NA 139 140  K 4.1 3.5  CL 105 111  CO2 22 21*  GLUCOSE 88 69*  BUN 16 14  CREATININE 0.94 0.94  CALCIUM 9.1 8.5*  MG 2.2 1.9  PHOS 2.5 2.3*   Lipid Panel:  Recent Labs  Lab 04/11/22 0331  TRIG 81   HgbA1c: No results for input(s): "HGBA1C" in the last 168 hours. Urine Drug Screen: No results for input(s): "LABOPIA", "COCAINSCRNUR", "LABBENZ", "AMPHETMU", "THCU", "LABBARB" in the last 168 hours.  Alcohol Level  Recent Labs  Lab 04/09/22 1203  ETH <10    IMAGING past 24 hours MR BRAIN W WO CONTRAST  Result Date: 04/13/2022 CLINICAL DATA:  Initial evaluation for TIA. EXAM: MRI HEAD WITHOUT AND WITH CONTRAST TECHNIQUE: Multiplanar, multiecho pulse sequences of the brain and surrounding structures were obtained without and with intravenous contrast. CONTRAST:  81m GADAVIST GADOBUTROL 1 MMOL/ML IV SOLN COMPARISON:  Prior CT from 04/11/2022 and  MRI from 04/01/2022. FINDINGS: Brain: Diffuse prominence of the CSF containing spaces compatible with generalized cerebral atrophy. Patchy T2/FLAIR hyperintensity involving the periventricular and deep white matter both cerebral hemispheres, consistent with chronic small vessel ischemic disease. Multiple remote infarcts involving the bilateral cerebral hemispheres again noted. Scattered remote lacunar infarcts about the left greater than right basal ganglia and thalami. Small remote pontine and left cerebellar infarcts noted. Scattered multifocal restricted diffusion seen involving the cortical and subcortical aspect of both cerebral hemispheres, consistent with evolving ischemic infarcts. While some of these are subacute in nature related to previously identified infarcts seen on prior brain MRI from 03/22/2022, there are multiple new areas of acute infarction seen since prior exam. These are most pronounced at the right frontal operculum (series 5, image 90), bilateral occipital lobes (series 5, image 76), and right temporal lobe (series 5, image 75). Patchy involvement of the left greater than right basal ganglia of and splenium. New small acute ischemic left cerebellar infarct (series 5, image 69). Associated mild scattered petechial blood products within the right frontal and occipital lobes (series 11, images 34, 24). No frank hemorrhagic transformation or significant  regional mass effect. No mass lesion or midline shift. Ventricles within normal limits without hydrocephalus. No extra-axial fluid collection. Pituitary gland and suprasellar region within normal limits. Mild enhancement noted about the evolving subacute right MCA territory infarct involving the posterior right frontal region (series 19, image 14). No other abnormal enhancement. Vascular: Loss of normal flow void within the left ICA to the terminus, consistent with known chronic occlusion. Major intracranial vascular flow voids are otherwise  maintained. Skull and upper cervical spine: Craniocervical junction within normal limits. Bone marrow signal intensity normal. No scalp soft tissue abnormality. Sinuses/Orbits: Prior bilateral ocular lens replacement. Left gaze preference noted. Scattered mucosal thickening noted about the ethmoidal air cells and maxillary sinuses. Paranasal sinuses are otherwise clear. Trace bilateral mastoid effusions noted, of doubtful significance. Other: None. IMPRESSION: 1. Scattered multifocal ischemic infarcts involving the cortical and subcortical aspect of both cerebral hemispheres as well as the left cerebellum. While some of these are subacute in nature related to the previously identified infarcts seen on prior brain MRI from 03/22/2022, there are multiple new areas of acute infarction, most notably at the right frontal operculum, bilateral occipital lobes, and right temporal lobe. Associated mild scattered petechial blood products without lobes without frank hemorrhagic transformation or significant regional mass effect. 2. Underlying age-related cerebral atrophy with chronic small vessel ischemic disease, with multiple remote infarcts as above. Electronically Signed   By: Jeannine Boga M.D.   On: 04/13/2022 02:03    PHYSICAL EXAM  Temp:  [97.4 F (36.3 C)-98.4 F (36.9 C)] 97.4 F (36.3 C) (02/28 1200) Pulse Rate:  [54-87] 83 (02/28 1200) Resp:  [8-23] 18 (02/28 1200) BP: (141-196)/(65-114) 159/85 (02/28 1200) SpO2:  [94 %-100 %] 97 % (02/28 1200)  General - Well nourished, well developed elderly Caucasian male, in no apparent distress. Cardiovascular - Regular rhythm and rate.  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   Motor Tone - Muscle tone was assessed at the neck and appendages and was normal.  Sensory -withdraws to pain in all 4 extremities  Coordination - unable to  assess   Gait and Station - deferred.  ASSESSMENT/PLAN Mr. GERREL SALMELA is a 77 y.o. 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 recently discharged after admission for right M2 occlusion s/p thrombectomy (no thrombolytic given due to difficulty confirming with blood thinners he was on).  He was discharged on aspirin and Plavix and enrolled in the [place] [facility] trial on 1/23, then readmitted on 2/17 with bilateral small embolic strokes of undetermined source.  Loop recorder was placed on 2/5. Presented to ED for evaluation of nonverbal, very weak on the left side, with much worse speech than his baseline     Acute metabolic encephalopathy secondary to bilateral cortical and subcortical new infarcts Recent bilateral small embolic shower, embolic pattern, secondary to unclear source  Code Stroke  CT head New subarachnoid hemorrhage along the right occipital lobe  Repeat CT head no acute process  CTA head & neck No LVO  MRI   Repeat CT 2/26: no hemorrhage, stable infarcts.  2D Echo 1/22: EF 60-65%. Left ventricular diastolic parameters are consistent with Grade II diastolic dysfunction Elevated left ventricular end-diastolic pressure.  Loop recorder placed 2/20  LDL 43 HgbA1c 5.7 VTE prophylaxis - SCD's    Diet   DIET - DYS 1 Room service appropriate? No; Fluid consistency: Thin   aspirin 81 mg daily and Brilinta (ticagrelor) 90 mg bid prior to admission, now on No antithrombotic. Pending discussion with the  family regarding starting medications back versus switching.  Therapy recommendations:  AIR Disposition:  pending CIR rehab consult assessment   Recent bilateral small embolic shower, embolic pattern, secondary to unclear source  2D Echo EF 60 to 65% LE venous Doppler no DVT Pan CT no malignancy  loop recorder placed before discharge at last admission. Interrogation per [facility]: NSR with ectopy only.    History of stroke 11/2014 admitted for angiogram for left ICA stenosis.  After procedure patient developed aphasia.  MRI showed right MCA and tiny left occipital infarcts.  Stroke likely related to procedure.  Angiogram showed left ICA/CCA 60 to 70% stenosis.  CTA neck showed left ICA 75 to 80% stenosis.  Carotid Doppler left ICA 60 to 79% stenosis.  EF 55 to 60%.  LDL 44.  Patient discharged on DAPT and Lipitor 80. 03/2021 patient admitted for left CRAO with partial improvement.  CTA head and neck showed left ICA occluded.  Right VA occlusion.  Bilateral P2 stenosis.  MRI showed no acute infarct.  EF 60 to 65%.  LDL 47, A1c 5.8.  Again discharged on DAPT and Lipitor 80. 02/2022 admitted for left-sided weakness and slurred speech.  Found to have right M2 occlusion due to calcified embolus.  MRI showed right MCA infarct.  LDL 36, A1c 5.7, enrolled in [place] stroke trial.  Also discharged on DAPT and Lipitor 80.   Carotid stenosis/occlusion CT head and neck chronic left ICA occlusion and right ICA atherosclerosis No intervention needed given chronic occlusion  Hypertension Home meds:  none Stable BP goal < 180 Long-term BP goal normotensive  Hyperlipidemia Home meds:  Lipitor 80 , resumed in hospital LDL 43, goal < 70 Continue statin at discharge  Acute Encephalopathy vs New focal seizures  LTM 2/24-2/25: Continuous slow, generalized and maximal bitemporal  This study is suggestive of cortical dysfunction in bitemporal region likely secondary to underlying strokes. Additionally there  is moderate diffuse encephalopathy, nonspecific etiology. No seizures or epileptiform discharges were seen throughout the recording. LTM 2/25 1352 to 2/26 1006 This study is suggestive of cortical dysfunction in bitemporal region likely secondary to underlying strokes. Additionally there is moderate diffuse encephalopathy, nonspecific etiology. No seizures or epileptiform discharges were seen throughout the recording  Keppra 2 g load, then 250 mg BID, discontinued 2/26 as no focal seizures seen throughout LTM EEG monitoring. LTM EEG discontinued   Dysphagia  Hx of partial glossectomy/radiation.  Dys 2 diet on recent admission (04/02/22). Suspect he is at is baseline for swallowing, will order CoreTrak to ensure adequate PO intake and medication administration. Speech Therapy recommends AIR disposition  Other Stroke Risk Factors Advanced Age >/= 28  Former Cigarette smoker, cessation education provided. Hx stroke/TIA Coronary artery disease Congestive heart failure PVD  Other Active Problems Laryngeal/tongue cancer status post chemotherapy, radiation and partial glossectomy RLS Pulmonary fibrosis with chronic hypoxia AKI Cr 1.26- Increase IVF to 50cc/hr 2/25, Cr improved to 1.02 today, will  continue IVF.  DNR Status: requested yesterday by POA, patients cousin.   Hospital day # 4   Patient known to stroke team from admission in January for cryptogenic stroke and was enrolled in the [place] stroke trial for stroke prevention study.  He presented with recurrent strokes 1 week ago and study medication was discontinued.  He presented again yesterday with altered mental status and encephalopathy with suspected seizures.  Long-term EEG monitoring overnight does not confirm any seizures and patient was too agitated and restless to undergo MRI scan.  Initially but 1 yesterday shows bilateral cortical and subcortical and left cerebellar infarcts of cryptogenic etiology.  He has now failed aspirin  Plavix, aspirin and Brilinta and lately (study medication.  I suspect he will have significant cognitive impairment at baseline and may need to go to skilled nursing facility for rehabilitation likely need 24-hour care.  I spoke over the phone with the patient's health power of attorney and discussed his condition.  She agreed to DNR and is willing to come in later this afternoon for a family meeting to discuss goals of care.  Palliative care consult will also be appropriate.  Discussed with Dr. Cruzita Lederer Greater than 50% time during this 35-minute visit was spent on counseling and coordination of care about his presentation discussion of differential diagnosis of seizures versus encephalopathy and stroke and answering questions.   ADDENDUM : Met with patient's health power of attorney [name] and her daughter Meredith Mody this afternoon and discuss patient's case in detail.  They agree with DNR and will asked patient daughter to come and visit and they are likely leaning towards palliative care and comfort care measures soon. Antony Contras, MD Medical Director [facility] Pager: [phone] 04/13/2022 1:47 PM

## 2022-04-13 NOTE — Progress Notes (Signed)
James Richards transferred from 4N to 3W. VSS, TELE applied. Safety precautions and orders reviewed with James Richards. Upon assessment, he verbalized that he wants to go home and he does "not take any medication at night" and for RN to "go ahead and keep them". No other distress noted or concerns raise. Will continue to monitor.

## 2022-04-14 DIAGNOSIS — Z7189 Other specified counseling: Secondary | ICD-10-CM | POA: Diagnosis not present

## 2022-04-14 DIAGNOSIS — I639 Cerebral infarction, unspecified: Secondary | ICD-10-CM | POA: Diagnosis not present

## 2022-04-14 DIAGNOSIS — I609 Nontraumatic subarachnoid hemorrhage, unspecified: Secondary | ICD-10-CM | POA: Diagnosis not present

## 2022-04-14 DIAGNOSIS — Z515 Encounter for palliative care: Secondary | ICD-10-CM | POA: Diagnosis not present

## 2022-04-14 DIAGNOSIS — Z66 Do not resuscitate: Secondary | ICD-10-CM | POA: Diagnosis not present

## 2022-04-14 DIAGNOSIS — R451 Restlessness and agitation: Secondary | ICD-10-CM

## 2022-04-14 LAB — CBC
HCT: 40.2 % (ref 39.0–52.0)
Hemoglobin: 13.5 g/dL (ref 13.0–17.0)
MCH: 31.1 pg (ref 26.0–34.0)
MCHC: 33.6 g/dL (ref 30.0–36.0)
MCV: 92.6 fL (ref 80.0–100.0)
Platelets: 259 10*3/uL (ref 150–400)
RBC: 4.34 MIL/uL (ref 4.22–5.81)
RDW: 14.2 % (ref 11.5–15.5)
WBC: 7.3 10*3/uL (ref 4.0–10.5)
nRBC: 0 % (ref 0.0–0.2)

## 2022-04-14 LAB — BASIC METABOLIC PANEL
Anion gap: 9 (ref 5–15)
BUN: 8 mg/dL (ref 8–23)
CO2: 23 mmol/L (ref 22–32)
Calcium: 8.7 mg/dL — ABNORMAL LOW (ref 8.9–10.3)
Chloride: 107 mmol/L (ref 98–111)
Creatinine, Ser: 0.83 mg/dL (ref 0.61–1.24)
GFR, Estimated: >60 mL/min (ref >60)
Glucose, Bld: 91 mg/dL (ref 70–99)
Potassium: 3.2 mmol/L — ABNORMAL LOW (ref 3.5–5.1)
Sodium: 139 mmol/L (ref 135–145)

## 2022-04-14 MED ORDER — GLYCOPYRROLATE 0.2 MG/ML IJ SOLN: 0.2000 mg | INTRAMUSCULAR | Status: DC | PRN

## 2022-04-14 MED ORDER — LORAZEPAM 1 MG PO TABS: 1.0000 mg | ORAL_TABLET | ORAL | Status: DC | PRN

## 2022-04-14 MED ORDER — HALOPERIDOL 0.5 MG PO TABS: 2.0000 mg | ORAL_TABLET | ORAL | Status: DC | PRN

## 2022-04-14 MED ORDER — POTASSIUM CHLORIDE CRYS ER 20 MEQ PO TBCR
40.0000 meq | EXTENDED_RELEASE_TABLET | Freq: Once | ORAL | Status: AC
  Administered 2022-04-14: 40 meq via ORAL
  Filled 2022-04-14: qty 2

## 2022-04-14 MED ORDER — LORAZEPAM 2 MG/ML PO CONC: 1.0000 mg | ORAL | Status: DC | PRN

## 2022-04-14 MED ORDER — BIOTENE DRY MOUTH MT LIQD
15.0000 mL | Freq: Three times a day (TID) | OROMUCOSAL | Status: DC
  Administered 2022-04-14 – 2022-04-15 (×3): 15 mL via TOPICAL

## 2022-04-14 MED ORDER — HALOPERIDOL LACTATE 2 MG/ML PO CONC: 2.0000 mg | ORAL | Status: DC | PRN

## 2022-04-14 MED ORDER — POTASSIUM CHLORIDE CRYS ER 20 MEQ PO TBCR: 40.0000 meq | EXTENDED_RELEASE_TABLET | Freq: Once | ORAL | Status: DC

## 2022-04-14 MED ORDER — GLYCOPYRROLATE 1 MG PO TABS: 1.0000 mg | ORAL_TABLET | ORAL | Status: DC | PRN

## 2022-04-14 MED ORDER — HALOPERIDOL LACTATE 5 MG/ML IJ SOLN: 2.0000 mg | INTRAMUSCULAR | Status: DC | PRN

## 2022-04-14 MED ORDER — POLYVINYL ALCOHOL 1.4 % OP SOLN: 1.0000 [drp] | Freq: Four times a day (QID) | OPHTHALMIC | Status: DC | PRN

## 2022-04-14 NOTE — TOC Progression Note (Addendum)
Transition of Care [facility]) - Progression Note    Patient Details  Name: James Richards MRN: [id number] Date of Birth: [birth year]  Transition of Care [facility]) CM/SW [place], [place] Phone Number: 04/14/2022, 12:06 PM  Clinical Narrative:   CSW updated by palliative that family has chosen comfort care, requesting residential hospice in [place]. CSW gave referral to [place], they will review. CSW to follow.   UPDATE: CSW updated by hospice liaison that patient's cousin would like to take the patient home with hospice care. DME to be delivered tomorrow morning. CSW to follow.   Expected Discharge Plan: [place] Barriers to Discharge: Hospice Bed not available  Expected Discharge Plan and Services In-house Referral: Clinical Social Work   Post Acute Care Choice: [place] Living arrangements for the past 2 months: Single Family Home                                       Social Determinants of Health (SDOH) Interventions SDOH Screenings   Food Insecurity: No Food Insecurity (04/05/2022)  Housing: Low Risk  (03/08/2022)  Transportation Needs: No Transportation Needs (04/05/2022)  Utilities: Not At Risk (03/08/2022)  Alcohol Screen: Low Risk  (12/27/2021)  Depression (PHQ2-9): Low Risk  (04/05/2022)  Financial Resource Strain: Low Risk  (08/26/2021)  Physical Activity: Inactive (08/26/2021)  Social Connections: Moderately Isolated (08/26/2021)  Stress: No Stress Concern Present (08/26/2021)  Tobacco Use: Medium Risk (04/09/2022)    Readmission Risk Interventions     No data to display

## 2022-04-14 NOTE — Progress Notes (Signed)
Pt had poor meal intake today which only included 1 container of applesauce this morning and 2 oz of orange juice. Pt refused all lunch and he only ate 25% of dinner with a couple small sips of tea.

## 2022-04-14 NOTE — Progress Notes (Signed)
Patient accidentally pulled out PIV while moving in bed. Patient declined to have another PIV reinserted. Patent continues to requests to go home. Emotional support given. Will continue to monitor.

## 2022-04-14 NOTE — Progress Notes (Signed)
PROGRESS NOTE  James Richards [id number] DOB: 10-23-1945 DOA: 04/09/2022 PCP: Pcp, No   LOS: 5 days   Brief Narrative / Interim history: James Richards is a 77 y.o. male with a past medical history significant for recent stroke(discharged 1/23, and then again on 2/19 for recurrent strokes) malignancy workup negative, loop recorder placed at that time. Also carries history of chronic right PCA occlusion, right vertebral occlusion, left carotid occlusion, hypertension, hyperlipidemia, BMI 29, left CRAO, poor vision, extremely hard of hearing, malignant neoplasm of the tongue and larynx s/p surgery, peripheral artery disease, restless leg syndrome. Patient readmitted for acute metabolic encephalopathy concerning for breakthrough seizure versus recurrent stroke in the setting of his recent history and multiple strokes.  Subjective / 24h Interval events: Lost IVs overnight, does not want to have another 1 placed.  Asking to go home.  Intermittently confused  Assesement and Plan: Active Problems:   Acute embolic stroke (HCC)   Left hemiparesis (HCC)   Atherosclerosis of native artery of extremity with intermittent claudication (HCC)   Carotid stenosis   Hyperlipidemia   Restless legs syndrome   History of TIA (transient ischemic attack)   Hypertensive heart disease with chronic diastolic congestive heart failure (HCC)   Hearing loss of both ears due to cerumen impaction   Hypertensive heart disease with congestive heart failure and with combined systolic and diastolic dysfunction ([place])   Principal problem Acute CVA -MRI done 2/27 showed scattered multifocal ischemic infarcts the cortical and subcortical aspect of both cerebral hemisphere as well as left cerebellum.  Some of these are new and some are subacute.  MRI also showed associated mild scattered potential blood products without frank hemorrhagic transformation.  Patient was recently hospitalized and discharged 0000000 for embolic  strokes.  He apparently had a loop recorder but there were no significant events.  Neurology consulted and following, for now continue aspirin and Plavix, statin.  Just underwent full stroke workup 4 weeks ago -PT recommends SNF -Very poor prognosis, palliative care was also consulted.  POA has been involved  Active problems Acute metabolic encephalopathy-due to #1.  Essential hypertension-on metoprolol  Hyperlipidemia-continue statin  CAD, PAD -with multiple arterial occlusions, on aspirin and Plavix  Paroxysmal SVT -continue metoprolol.  2D echo done on March 07, 2022 showed LVEF 60-65%, no WMA, grade 2 diastolic dysfunction.  RV was normal  Hypothyroidism-continue Synthroid  RLS-continue Requip    Scheduled Meds:  aspirin  81 mg Oral Daily   atorvastatin  80 mg Oral Daily   clopidogrel  75 mg Oral Daily   cyanocobalamin  500 mcg Oral Daily   enoxaparin (LOVENOX) injection  40 mg Subcutaneous Q24H   fluticasone furoate-vilanterol  1 puff Inhalation Daily   levothyroxine  125 mcg Oral Q0600   metoprolol tartrate  12.5 mg Oral BID   mouth rinse  15 mL Mouth Rinse 4 times per day   pantoprazole  40 mg Oral Daily   rOPINIRole  2 mg Oral QHS   senna-docusate  1 tablet Oral BID   Continuous Infusions:  sodium chloride 50 mL/hr at 04/10/22 1100   sodium chloride Stopped (04/14/22 0400)   PRN Meds:.sodium chloride, acetaminophen **OR** acetaminophen (TYLENOL) oral liquid 160 mg/5 mL **OR** acetaminophen, hydrALAZINE, ipratropium-albuterol, mouth rinse, polyvinyl alcohol  Current Outpatient Medications  Medication Instructions   acetaminophen-codeine (TYLENOL #3) 300-30 MG tablet TAKE 1 TABLET EVERY 4 HOURS AS NEEDED FOR MODERATE PAIN   albuterol (VENTOLIN HFA) 108 (90 Base) MCG/ACT inhaler 1 puff, Inhalation,  As needed   ascorbic acid (VITAMIN C) 500 mg, Oral, Daily   Aspirin Low Dose 81 mg, Oral, Daily, Swallow whole. FOR ONLY ONE MONTH.   atorvastatin (LIPITOR) 80 MG  tablet TAKE 1 TABLET EVERY DAY   Brilinta 90 mg, Oral, 2 times daily   [START ON 05/03/2022] clopidogrel (PLAVIX) 75 mg, Oral, Daily, TO BE STARTED ON 05/03/22.   furosemide (LASIX) 40 mg, Oral, Every other day   levothyroxine (SYNTHROID) 125 mcg, Oral, Daily   metoprolol tartrate (LOPRESSOR) 25 MG tablet Take 1/2 tablet (12.5 mg total) by mouth 2 (two) times daily.   minocycline (MINOCIN) 50 mg, Oral, Daily   Naphazoline HCl (CLEAR EYES OP) 1-2 drops, Both Eyes, As needed   Omega-3 Fatty Acids (FISH OIL) 1200 MG CPDR 1 capsule, Oral, Daily   pantoprazole (PROTONIX) 40 mg, Oral, Daily   Potassium Bicarbonate 99 mg, Oral, Daily   rOPINIRole (REQUIP) 2 mg, Oral, Daily at bedtime   SYMBICORT 160-4.5 MCG/ACT inhaler 2 puffs, Inhalation, Daily   vitamin B-12 (CYANOCOBALAMIN) 500 mcg, Oral, Daily   zinc gluconate 50 mg, Oral, Daily    Diet Orders (From admission, onward)     Start     Ordered   04/11/22 1404  DIET - DYS 1 Room service appropriate? No; Fluid consistency: Thin  Diet effective now       Comments: Full supervision  Question Answer Comment  Room service appropriate? No   Fluid consistency: Thin      04/11/22 1403            DVT prophylaxis: enoxaparin (LOVENOX) injection 40 mg Start: 04/12/22 1130   Lab Results  Component Value Date   PLT 259 04/14/2022      Code Status: DNR  Family Communication: no family at bedside   Status is: Inpatient  Remains inpatient appropriate because: severity of illness  Level of care: Telemetry Medical  Consultants:  Neurology   Objective: Vitals:   04/13/22 2324 04/14/22 0403 04/14/22 0751 04/14/22 0816  BP: (!) 166/85 (!) 186/95  (!) 188/107  Pulse: 66 77  96  Resp: '18 18  18  '$ Temp: 98.1 F (36.7 C) 98.2 F (36.8 C)  98.8 F (37.1 C)  TempSrc: Oral Oral  Axillary  SpO2: 95% 99% 99% 98%  Weight:      Height:        Intake/Output Summary (Last 24 hours) at 04/14/2022 [id number] Last data filed at 04/14/2022 0500 Gross  per 24 hour  Intake 1026.99 ml  Output 1800 ml  Net -773.01 ml    Wt Readings from Last 3 Encounters:  04/09/22 79.4 kg  04/02/22 83.8 kg  03/14/22 80.7 kg    Examination:  Constitutional: NAD Eyes: lids and conjunctivae normal, no scleral icterus ENMT: mmm Neck: normal, supple Respiratory: clear to auscultation bilaterally, no wheezing, no crackles. Normal respiratory effort.  Cardiovascular: Regular rate and rhythm, no murmurs / rubs / gallops. No LE edema. Abdomen: soft, no distention, no tenderness. Bowel sounds positive.  Skin: no rashes  Data Reviewed: I have independently reviewed following labs and imaging studies   CBC Recent Labs  Lab 04/09/22 1203 04/09/22 1208 04/11/22 0331 04/12/22 0423 04/13/22 0405 04/14/22 0452  WBC 6.1  --  6.8 8.4 6.3 7.3  HGB 13.1 13.9 14.0 13.2 12.3* 13.5  HCT 40.1 41.0 42.3 40.0 36.7* 40.2  PLT 263  --  245 258 236 259  MCV 96.6  --  94.0 94.3 93.9 92.6  MCH 31.6  --  31.1 31.1 31.5 31.1  MCHC 32.7  --  33.1 33.0 33.5 33.6  RDW 14.9  --  14.4 14.8 14.6 14.2  LYMPHSABS 0.7  --   --   --   --   --   MONOABS 0.6  --   --   --   --   --   EOSABS 0.3  --   --   --   --   --   BASOSABS 0.0  --   --   --   --   --      Recent Labs  Lab 04/09/22 1203 04/09/22 1208 04/11/22 0331 04/12/22 0423 04/13/22 0405 04/14/22 0452  NA 137 141 139 139 140 139  K 4.5 4.6 4.0 4.1 3.5 3.2*  CL 101 101 107 105 111 107  CO2 30  --  22 22 21* 23  GLUCOSE 95 93 91 88 69* 91  BUN 20 28* '15 16 14 8  '$ CREATININE 1.26* 1.20 1.02 0.94 0.94 0.83  CALCIUM 8.7*  --  9.0 9.1 8.5* 8.7*  AST 36  --   --   --   --   --   ALT 41  --   --   --   --   --   ALKPHOS 77  --   --   --   --   --   BILITOT 1.3*  --   --   --   --   --   ALBUMIN 3.3*  --   --   --   --   --   MG  --   --  2.1 2.2 1.9  --   INR 1.0  --   --   --   --   --       ------------------------------------------------------------------------------------------------------------------ No results for input(s): "CHOL", "HDL", "LDLCALC", "TRIG", "CHOLHDL", "LDLDIRECT" in the last 72 hours.   Lab Results  Component Value Date   HGBA1C 5.7 (H) 03/06/2022   ------------------------------------------------------------------------------------------------------------------ No results for input(s): "TSH", "T4TOTAL", "T3FREE", "THYROIDAB" in the last 72 hours.  Invalid input(s): "FREET3"  Cardiac Enzymes No results for input(s): "CKMB", "TROPONINI", "MYOGLOBIN" in the last 168 hours.  Invalid input(s): "CK" ------------------------------------------------------------------------------------------------------------------    Component Value Date/Time   BNP 31.4 10/08/2019 1000    CBG: Recent Labs  Lab 04/09/22 1203 04/09/22 1234  GLUCAP 94 97     Recent Results (from the past 240 hour(s))  MRSA Next Gen by PCR, Nasal     Status: None   Collection Time: 04/09/22  1:18 PM   Specimen: Nasal Mucosa; Nasal Swab  Result Value Ref Range Status   MRSA by PCR Next Gen NOT DETECTED NOT DETECTED Final    Comment: (NOTE) The GeneXpert MRSA Assay (FDA approved for NASAL specimens only), is one component of a comprehensive MRSA colonization surveillance program. It is not intended to diagnose MRSA infection nor to guide or monitor treatment for MRSA infections. Test performance is not FDA approved in patients less than 3 years old. Performed at [facility], [place] [address] [place], [place] [address]      Radiology Studies: No results found.   Marzetta Board, MD, PhD [facility]  Between 7 am - 7 pm I am available, please contact me via Amion (for emergencies) or Securechat (non urgent messages)  Between 7 pm - 7 am I am not available, please contact night coverage MD/APP via Amion

## 2022-04-14 NOTE — Consult Note (Signed)
Consultation Note Date: 04/14/2022   Patient Name: James Richards  DOB: 25-Jul-1945  MRN: [id number]  Age / Sex: 77 y.o., male  PCP: Pcp, No Referring Physician: Caren Griffins, MD  Reason for Consultation: Establishing goals of care  HPI/Patient Profile: 77 y.o. male  with past medical history of recent stroke(discharged 1/23, and then again on 2/19 for recurrent strokes), chronic right PCA occlusion, right vertebral occlusion, left carotid occlusion, hypertension, hyperlipidemia, BMI 38, left CRAO, poor vision, extremely hard of hearing, malignant neoplasm of the tongue and larynx s/p surgery, peripheral artery disease, and restless leg syndrome admitted on 04/09/2022 with acute metabolic encephalopathy. Patient found to have acute CVA on MRI done 2/27. Patient with very poor PO intake. Patient has removed his IVs and doesn't want them replaced. PMT consulted to discuss [place].    Clinical Assessment and Goals of Care: I have reviewed medical records including EPIC notes, labs and imaging, received report from Ginger, RN, assessed the patient and then met with patient's POA/cousin [name]  to discuss diagnosis prognosis, GOC, EOL wishes, disposition and options.  Initially attempted to meet with patient however unable to discuss [place] r/t speech impairment. Per neuro note, significant cognitive impairment. Nursing charting reveals patient disoriented to situation.   Later in the day I met with patient's POA [name].   I introduced Palliative Medicine as specialized medical care for people living with serious illness. It focuses on providing relief from the symptoms and stress of a serious illness. The goal is to improve quality of life for both the patient and the family.  We discuss patient's frequent hospitalizations recently. We discuss his strokes. We discuss that quality of life is important to patient. She shares patient has been adamant he would not  want to go to SNF. She also shares he cannot be cared for at home. She tells me she is interested in hospice care for him, specifically [facility] facility.   We discuss philosophy of hospice care - [name] agrees with this and confirms this is what patient would want.   We discuss patient's very poor PO intake - today has only taked bites of applesauce with meds - declined breakfast and lunch. Is declining IV fluids.    We discussed patient's current illness and what it means in the larger context of patient's on-going co-morbidities.  Natural disease trajectory and expectations at EOL were discussed.  Questions and concerns were addressed. The family was encouraged to call with questions or concerns.    Primary Decision Maker HCPOA - Faye    SUMMARY OF RECOMMENDATIONS   Family interested in comfort measures only/transfer [place] hospice facility - [facility] aware Patient with very little PO intake, refusing IV fluids Some agitation - added PRN haldol and ativan Discussed with Dr. Cruzita Lederer  Code Status/Advance Care Planning: DNR  Discharge Planning: Hospice facility      Primary Diagnoses: Present on Admission:  Acute embolic stroke [facility])  Atherosclerosis of native artery of extremity with intermittent claudication (HCC)  Carotid stenosis  Hearing loss of both ears due to cerumen impaction  Hyperlipidemia  Hypertensive heart disease with chronic diastolic congestive heart failure ([place])  Hypertensive heart disease with congestive heart failure and with combined systolic and diastolic dysfunction (HCC)  Left hemiparesis (HCC)  Restless legs syndrome   I have reviewed the medical record, interviewed the patient and family, and examined the patient. The following aspects are pertinent.  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   History of recurrent TIAs 12/11/2014   Impacted cerumen 11/26/2019   Malignant neoplasm of base of tongue ([place]) 01/21/2015   Malignant neoplasm of larynx, unspecified (HCC)    Migraine 04/02/2014   Mixed hyperlipidemia    Other fatigue    Other specified hypothyroidism    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    Restless legs syndrome    Restrictive lung disease 11/26/2019   TIA (transient ischemic attack) 11/24/2014   Vision exam with abnormal findings concern for left retinal artery occlusion 03/29/2021   Social History   Socioeconomic History   Marital status: Divorced    Spouse name: Not on file   Number of children: Not on file   Years of education: Not on file   Highest education level: Not on file  Occupational History   Not on file  Tobacco Use   Smoking status: Former    Packs/day: 1.00    Years: 41.00    Total pack years: 41.00    Types: Cigarettes    Quit date: 02/15/2007    Years since quitting: 15.1   Smokeless tobacco: Former    Types: Nurse, children's Use: Never used  Substance and Sexual Activity   Alcohol use: No    Alcohol/week: 0.0 standard drinks of alcohol   Drug use: No   Sexual activity: Not Currently    Partners: Female  Other Topics Concern   Not on file  Social History Narrative   Not on file   Social Determinants of Health   Financial Resource Strain: Low Risk  (08/26/2021)   Overall Financial Resource Strain (CARDIA)    Difficulty of Paying Living Expenses: Not hard at all  Food Insecurity: No Food Insecurity (04/05/2022)   Hunger Vital Sign    Worried About Running Out of Food in the Last Year: Never true    Ran Out of Food in the Last Year: Never true  Transportation Needs: No  Transportation Needs (04/05/2022)   PRAPARE - Hydrologist (Medical): No    Lack of Transportation (Non-Medical): No  Physical Activity: Inactive (08/26/2021)   Exercise Vital Sign    Days of Exercise per Week: 0 days    Minutes of Exercise per Session: 0 min  Stress: No Stress Concern Present (08/26/2021)   [place]    Feeling of Stress : Not at all  Social Connections: Moderately Isolated (08/26/2021)   Social Connection and Isolation Panel [NHANES]    Frequency of Communication with Friends and Family: More than three times a week    Frequency of Social Gatherings with Friends and Family: More than three times a week    Attends Religious Services: More than 4 times per year    Active Member of [employer] or Organizations: No    Attends Archivist Meetings: Never    Marital Status: Divorced   Family History  Problem Relation Age of Onset   Other Father        amputation   Deep vein thrombosis Father    Heart disease Father        Atrial Fib.   Cancer  Sister    Hypertension Brother    Scheduled Meds:  antiseptic oral rinse  15 mL Topical TID   fluticasone furoate-vilanterol  1 puff Inhalation Daily   levothyroxine  125 mcg Oral Q0600   metoprolol tartrate  12.5 mg Oral BID   mouth rinse  15 mL Mouth Rinse 4 times per day   rOPINIRole  2 mg Oral QHS   senna-docusate  1 tablet Oral BID   Continuous Infusions: PRN Meds:.acetaminophen **OR** acetaminophen (TYLENOL) oral liquid 160 mg/5 mL **OR** acetaminophen, glycopyrrolate **OR** glycopyrrolate **OR** glycopyrrolate, haloperidol **OR** haloperidol **OR** haloperidol lactate, ipratropium-albuterol, LORazepam **OR** LORazepam, mouth rinse, polyvinyl alcohol No Known Allergies Review of Systems  Unable to perform ROS: Other    Physical Exam Constitutional:      General: He is not in acute distress.    Appearance: He is  ill-appearing.     Comments: aphasic  Pulmonary:     Effort: Pulmonary effort is normal.  Skin:    General: Skin is warm and dry.  Neurological:     Mental Status: He is alert.     Comments: Unable to assess due to aphasia      Vital Signs: BP (!) 173/92   Pulse 67   Temp 98.2 F (36.8 C) (Oral)   Resp 18   Ht '5\' 5"'$  (1.651 m)   Wt 79.4 kg   SpO2 99%   BMI 29.13 kg/m  Pain Scale: 0-10   Pain Score: 0-No pain   SpO2: SpO2: 99 % O2 Device:SpO2: 99 % O2 Flow Rate: .   IO: Intake/output summary:  Intake/Output Summary (Last 24 hours) at 04/14/2022 1411 Last data filed at 04/14/2022 0500 Gross per 24 hour  Intake 1026.99 ml  Output 1800 ml  Net -773.01 ml    LBM: Last BM Date : 04/11/22 Baseline Weight: Weight: 79.4 kg Most recent weight: Weight: 79.4 kg     Palliative Assessment/Data: PPS 20% as very little PO intake     *Please note that this is a verbal dictation therefore any spelling or grammatical errors are due to the "[place]" system interpretation.   Juel Burrow, DNP, AGNP-C Palliative Medicine Team [phone] Pager: [phone]

## 2022-04-14 NOTE — Care Management Important Message (Signed)
Important Message  Patient Details  Name: James Richards MRN: [id number] Date of Birth: [birth year]   Medicare Important Message Given:  Yes     [name] 04/14/2022, 3:02 PM

## 2022-04-14 NOTE — Progress Notes (Signed)
STROKE TEAM PROGRESS NOTE   INTERVAL HISTORY No family at the bedside.  I met yesterday afternoon with his healthcare power of attorney his cousin James Richards and her daughter.  The plan to meet with palliative care and discuss goals of care No new neurological events overnight. VSS, Labs WNL.  Patient appears to be more alert and interactive this morning.  He remains disoriented and confused.  He can follow simple commands and answer some questions.  He is able to move all 4 extremities against gravity.    Vitals:   04/14/22 0403 04/14/22 0751 04/14/22 0816 04/14/22 1128  BP: (!) 186/95  (!) 188/107 (!) 173/92  Pulse: 77  96 67  Resp: 18  18   Temp: 98.2 F (36.8 C)  98.8 F (37.1 C) 98.2 F (36.8 C)  TempSrc: Oral  Axillary Oral  SpO2: 99% 99% 98% 99%  Weight:      Height:       CBC:  Recent Labs  Lab 04/09/22 1203 04/09/22 1208 04/13/22 0405 04/14/22 0452  WBC 6.1   < > 6.3 7.3  NEUTROABS 4.5  --   --   --   HGB 13.1   < > 12.3* 13.5  HCT 40.1   < > 36.7* 40.2  MCV 96.6   < > 93.9 92.6  PLT 263   < > 236 259   < > = values in this interval not displayed.   Basic Metabolic Panel:  Recent Labs  Lab 04/12/22 0423 04/13/22 0405 04/14/22 0452  NA 139 140 139  K 4.1 3.5 3.2*  CL 105 111 107  CO2 22 21* 23  GLUCOSE 88 69* 91  BUN '16 14 8  '$ CREATININE 0.94 0.94 0.83  CALCIUM 9.1 8.5* 8.7*  MG 2.2 1.9  --   PHOS 2.5 2.3*  --    Lipid Panel:  Recent Labs  Lab 04/11/22 0331  TRIG 81   HgbA1c: No results for input(s): "HGBA1C" in the last 168 hours. Urine Drug Screen: No results for input(s): "LABOPIA", "COCAINSCRNUR", "LABBENZ", "AMPHETMU", "THCU", "LABBARB" in the last 168 hours.  Alcohol Level  Recent Labs  Lab 04/09/22 1203  ETH <10    IMAGING past 24 hours No results found.  PHYSICAL EXAM  Temp:  [97.5 F (36.4 C)-98.8 F (37.1 C)] 98.2 F (36.8 C) (02/29 1128) Pulse Rate:  [56-96] 67 (02/29 1128) Resp:  [13-21] 18 (02/29 0816) BP: (145-207)/(63-107)  173/92 (02/29 1128) SpO2:  [94 %-100 %] 99 % (02/29 1128)  General - Well nourished, well developed elderly Caucasian male, in no apparent distress. Cardiovascular - Regular rhythm and rate.  Mental Status -  Patient is awake and interactive.  Speech at times clear but difficult to understand most of the time.  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   Motor Tone - Muscle tone was assessed at the neck and appendages and was normal.  Sensory -withdraws to pain in all 4 extremities  Coordination - unable to  assess   Gait and Station - deferred.  ASSESSMENT/PLAN [name] is a 77 y.o. male with history of recent stroke, chronic right PCA occlusion, right vertebral occlusion, left carotid occlusion, hypertension, hyperlipidemia, BMI 17, left CRAO, poor vision, extremely hard of hearing, malignant neoplasm  of the tongue and larynx s/p surgery, peripheral artery disease, loop recorder in place, restless leg syndrome on Requip. recently discharged after admission for right M2 occlusion s/p thrombectomy (no thrombolytic given due to difficulty confirming with blood thinners he was on).  He was discharged on aspirin and Plavix and enrolled in the [place] [facility] trial on 1/23, then readmitted on 2/17 with bilateral small embolic strokes of undetermined source.  Loop recorder was placed on 2/5. Presented to ED for evaluation of nonverbal, very weak on the left side, with much worse speech than his baseline     Acute metabolic encephalopathy secondary to bilateral cortical and subcortical new infarcts Recent bilateral small embolic shower, embolic pattern, secondary to unclear source  Code Stroke  CT head New subarachnoid hemorrhage along the right occipital  lobe  Repeat CT head no acute process  CTA head & neck No LVO  MRI   Repeat CT 2/26: no hemorrhage, stable infarcts.  2D Echo 1/22: EF 60-65%. Left ventricular diastolic parameters are consistent with Grade II diastolic dysfunction Elevated left ventricular end-diastolic pressure.  Loop recorder placed 2/20  LDL 43 HgbA1c 5.7 VTE prophylaxis - SCD's    Diet   DIET - DYS 1 Room service appropriate? No; Fluid consistency: Thin   aspirin 81 mg daily and Brilinta (ticagrelor) 90 mg bid prior to admission, now on No antithrombotic. Pending discussion with the family regarding starting medications back versus switching.  Therapy recommendations:  AIR Disposition:  pending CIR rehab consult assessment   Recent bilateral small embolic shower, embolic pattern, secondary to unclear source  2D Echo EF 60 to 65% LE venous Doppler no DVT Pan CT no malignancy  loop recorder placed before discharge at last admission. Interrogation per [facility]: NSR with ectopy only.    History of stroke 11/2014 admitted for angiogram for left ICA stenosis.  After procedure patient developed aphasia.  MRI showed right MCA and tiny left occipital infarcts.  Stroke likely related to procedure.  Angiogram showed left ICA/CCA 60 to 70% stenosis.  CTA neck showed left ICA 75 to 80% stenosis.  Carotid Doppler left ICA 60 to 79% stenosis.  EF 55 to 60%.  LDL 44.  Patient discharged on DAPT and Lipitor 80. 03/2021 patient admitted for left CRAO with partial improvement.  CTA head and neck showed left ICA occluded.  Right VA occlusion.  Bilateral P2 stenosis.  MRI showed no acute infarct.  EF 60 to 65%.  LDL 47, A1c 5.8.  Again discharged on DAPT and Lipitor 80. 02/2022 admitted for left-sided weakness and slurred speech.  Found to have right M2 occlusion due to calcified embolus.  MRI showed right MCA infarct.  LDL 36, A1c 5.7, enrolled in [place] stroke trial.  Also discharged on DAPT and Lipitor 80.   Carotid  stenosis/occlusion CT head and neck chronic left ICA occlusion and right ICA atherosclerosis No intervention needed given chronic occlusion  Hypertension Home meds:  none Stable BP goal < 180 Long-term BP goal normotensive  Hyperlipidemia Home meds:  Lipitor 80 , resumed in hospital LDL 43, goal < 70 Continue statin at discharge  Acute Encephalopathy vs New focal seizures  LTM 2/24-2/25: Continuous slow, generalized and maximal bitemporal  This study is suggestive of cortical dysfunction in bitemporal region likely secondary to underlying strokes. Additionally there is moderate diffuse encephalopathy, nonspecific etiology. No seizures or epileptiform discharges were seen throughout the recording. LTM 2/25 1352 to 2/26 1006 This study is suggestive of cortical dysfunction in  bitemporal region likely secondary to underlying strokes. Additionally there is moderate diffuse encephalopathy, nonspecific etiology. No seizures or epileptiform discharges were seen throughout the recording  Keppra 2 g load, then 250 mg BID, discontinued 2/26 as no focal seizures seen throughout LTM EEG monitoring. LTM EEG discontinued   Dysphagia  Hx of partial glossectomy/radiation.  Dys 2 diet on recent admission (04/02/22). Suspect he is at is baseline for swallowing, will order [employer] to ensure adequate PO intake and medication administration. Speech Therapy recommends AIR disposition  Other Stroke Risk Factors Advanced Age >/= 67  Former Cigarette smoker, cessation education provided. Hx stroke/TIA Coronary artery disease Congestive heart failure PVD  Other Active Problems Laryngeal/tongue cancer status post chemotherapy, radiation and partial glossectomy RLS Pulmonary fibrosis with chronic hypoxia AKI Cr 1.26- Increase IVF to 50cc/hr 2/25, Cr improved to 1.02 today, will continue IVF.  DNR Status: requested yesterday by POA, patients cousin.   Hospital day # 5   Patient known to stroke team  from admission in January for cryptogenic stroke and was enrolled in the [place] stroke trial for stroke prevention study.  He presented with recurrent strokes 1 week ago and study medication was discontinued.  He presented again yesterday with altered mental status and encephalopathy with suspected seizures.  Long-term EEG monitoring overnight does not confirm any seizures and patient was too agitated and restless to undergo MRI scan.  Initially but 1 yesterday shows bilateral cortical and subcortical and left cerebellar infarcts of cryptogenic etiology.  He has now failed aspirin Plavix, aspirin and Brilinta and lately [place] stroke study medication.  I suspect he will have significant cognitive impairment at baseline and may need to go to skilled nursing facility for rehabilitation likely need 24-hour care.  I spoke yesterday at length with James Richards patient's cousin who is also  the patient's health power of attorney and discussed his condition.  She agreed to DNR and is willing to meet with palliative care to discuss goals of care.  She is leaning towards hospice but patient may be too good for hospice at the present time.  Discussed with Dr. Cruzita Lederer Greater than 50% time during this 35-minute visit was spent on counseling and coordination of care about his presentation discussion of differential diagnosis of seizures versus encephalopathy and stroke and answering questions.  Stroke team will sign off.  Kindly call for questions Antony Contras, MD Medical Director [place] Pager: [phone] 04/14/2022 12:31 PM

## 2022-04-15 DIAGNOSIS — Z515 Encounter for palliative care: Secondary | ICD-10-CM | POA: Diagnosis not present

## 2022-04-15 DIAGNOSIS — Z66 Do not resuscitate: Secondary | ICD-10-CM | POA: Diagnosis not present

## 2022-04-15 DIAGNOSIS — I639 Cerebral infarction, unspecified: Secondary | ICD-10-CM | POA: Diagnosis not present

## 2022-04-15 DIAGNOSIS — Z7189 Other specified counseling: Secondary | ICD-10-CM | POA: Diagnosis not present

## 2022-04-15 MED ORDER — LORAZEPAM 2 MG/ML PO CONC: 1.0000 mg | ORAL | 0 refills | Status: DC | PRN

## 2022-04-15 MED ORDER — ACETAMINOPHEN-CODEINE 300-30 MG PO TABS: 1.0000 | ORAL_TABLET | Freq: Four times a day (QID) | ORAL | 0 refills | Status: DC | PRN

## 2022-04-15 MED ORDER — HALOPERIDOL LACTATE 2 MG/ML PO CONC: 2.0000 mg | ORAL | 0 refills | Status: DC | PRN

## 2022-04-15 NOTE — Discharge Instructions (Signed)
Hold Plavix now, take aspirin and brilinta until 3/19, then take aspirin and plavix and stop brilinta

## 2022-04-15 NOTE — Progress Notes (Signed)
Pt wheeled off unit with PTAR. Discharge and medication list discussed with pt and placed in the packet for family access. All belongings went with patient.

## 2022-04-15 NOTE — Consult Note (Signed)
[facility]: Pt was approved yesterday for Home Hospice. His cousin [name] plans to serve as his primary caregiver at home with hospice support. DME: Hospital Bed, OBT, Nebulizer, Suction, BSC, Wheelchair ordered for delivery today by noon. [name] is prepared to take him home this afternoon once DME in place and we will plan for enrollment in the homecare setting once he is discharged. Please call with any questions. Thank you for the opportunity to serve this patient and family. Sharalyn Ink, RN [facility] [phone]

## 2022-04-15 NOTE — Discharge Summary (Signed)
Physician Discharge Summary  James Richards [id number] DOB: [birth year] DOA: 04/09/2022  PCP: Pcp, No  Admit date: 04/09/2022 Discharge date: 04/15/2022  Admitted From: home Disposition:  home with hospice  Discharge Condition: stable CODE STATUS: DNR Diet Orders (From admission, onward)     Start     Ordered   04/11/22 1404  DIET - DYS 1 Room service appropriate? No; Fluid consistency: Thin  Diet effective now       Comments: Full supervision  Question Answer Comment  Room service appropriate? No   Fluid consistency: Thin      04/11/22 1403            Brief Narrative / Interim history: James Richards is a 77 y.o. male with a past medical history significant for recent stroke(discharged 1/23, and then again on 2/19 for recurrent strokes) malignancy workup negative, loop recorder placed at that time. Also carries history of chronic right PCA occlusion, right vertebral occlusion, left carotid occlusion, hypertension, hyperlipidemia, BMI 29, left CRAO, poor vision, extremely hard of hearing, malignant neoplasm of the tongue and larynx s/p surgery, peripheral artery disease, restless leg syndrome. Patient readmitted for acute metabolic encephalopathy concerning for breakthrough seizure versus recurrent stroke in the setting of his recent history and multiple strokes.  Hospital Course / Discharge diagnoses: Active Problems:   Acute embolic stroke (HCC)   Left hemiparesis (HCC)   Atherosclerosis of native artery of extremity with intermittent claudication (HCC)   Carotid stenosis   Hyperlipidemia   Restless legs syndrome   History of TIA (transient ischemic attack)   Hypertensive heart disease with chronic diastolic congestive heart failure (HCC)   Hearing loss of both ears due to cerumen impaction   Hypertensive heart disease with congestive heart failure and with combined systolic and diastolic dysfunction ([place])   Principal problem Acute CVA -MRI done 2/27 showed scattered  multifocal ischemic infarcts the cortical and subcortical aspect of both cerebral hemisphere as well as left cerebellum.  Some of these are new and some are subacute.  MRI also showed associated mild scattered potential blood products without frank hemorrhagic transformation.  Patient was recently hospitalized and discharged 0000000 for embolic strokes.  He apparently had a loop recorder but there were no significant events.  Neurology consulted and following, for now continue aspirin and Plavix, statin.  Just underwent full stroke workup 4 weeks ago.  Multiple goals of care discussions were held, palliative was consulted, and eventually patient will be discharged home with hospice.   Active problems Acute metabolic encephalopathy-due to #1.  Likely to continue to have very poor mentation moving forward  Essential hypertension Hyperlipidemia CAD, PAD  Paroxysmal SVT -continue metoprolol.  2D echo done on March 07, 2022 showed LVEF 60-65%, no WMA, grade 2 diastolic dysfunction.  RV was normal Hypothyroidism-continue Synthroid RLS-continue Requip  Sepsis ruled out   Discharge Instructions  Discharge Instructions     Ambulatory referral to Neurology   Complete by: As directed    An appointment is requested in approximately: 8 weeks with Dr. Leonie Man or his APP      Allergies as of 04/15/2022   No Known Allergies      Medication List     TAKE these medications    acetaminophen-codeine 300-30 MG tablet Commonly known as: TYLENOL #3 Take 1 tablet by mouth every 6 (six) hours as needed for moderate pain. TAKE 1 TABLET EVERY 4 HOURS AS NEEDED FOR MODERATE PAIN Strength: 300-30 mg What changed: See the new  instructions.   albuterol 108 (90 Base) MCG/ACT inhaler Commonly known as: VENTOLIN HFA Inhale 1 puff into the lungs as needed for wheezing or shortness of breath.   ascorbic acid 500 MG tablet Commonly known as: VITAMIN C Take 500 mg by mouth daily.   Aspirin Low Dose 81 MG  tablet Generic drug: aspirin EC Take 1 tablet (81 mg total) by mouth daily. Swallow whole. FOR ONLY ONE MONTH.   atorvastatin 80 MG tablet Commonly known as: LIPITOR TAKE 1 TABLET EVERY DAY   Brilinta 90 MG Tabs tablet Generic drug: ticagrelor Take 1 tablet (90 mg total) by mouth 2 (two) times daily.   CLEAR EYES OP Place 1-2 drops into both eyes as needed (dry eye).   clopidogrel 75 MG tablet Commonly known as: PLAVIX Take 1 tablet (75 mg total) by mouth daily. TO BE STARTED ON 05/03/22. Start taking on: May 03, 2022   Fish Oil 1200 MG Cpdr Take 1 capsule by mouth daily.   furosemide 40 MG tablet Commonly known as: LASIX Take 1 tablet (40 mg total) by mouth every other day. What changed:  when to take this reasons to take this   haloperidol 2 MG/ML solution Commonly known as: HALDOL Place 1 mL (2 mg total) under the tongue every 4 (four) hours as needed for agitation (or delirium).   levothyroxine 125 MCG tablet Commonly known as: SYNTHROID Take 1 tablet (125 mcg total) by mouth daily.   LORazepam 2 MG/ML concentrated solution Commonly known as: ATIVAN Place 0.5 mLs (1 mg total) under the tongue every 4 (four) hours as needed for anxiety.   metoprolol tartrate 25 MG tablet Commonly known as: LOPRESSOR Take 1/2 tablet (12.5 mg total) by mouth 2 (two) times daily.   minocycline 50 MG capsule Commonly known as: MINOCIN Take 1 capsule (50 mg total) by mouth daily.   pantoprazole 40 MG tablet Commonly known as: PROTONIX Take 1 tablet (40 mg total) by mouth daily.   Potassium Bicarbonate 99 MG Caps Take 99 mg by mouth daily.   rOPINIRole 2 MG tablet Commonly known as: REQUIP Take 2 mg by mouth at bedtime.   Symbicort 160-4.5 MCG/ACT inhaler Generic drug: budesonide-formoterol Inhale 2 puffs into the lungs daily.   vitamin B-12 500 MCG tablet Commonly known as: CYANOCOBALAMIN Take 500 mcg by mouth daily.   zinc gluconate 50 MG tablet Take 50 mg by  mouth daily.        Follow-up Information     [facility]. Schedule an appointment as soon as possible for a visit in 2 month(s).   Specialty: Neurology Why: Please call and make an appointment with Dr. Leonie Man or APP for 8 weeks after discharge Contact information: [place] [phone]                Consultations: Neurology  Palliative care  Procedures/Studies:  MR BRAIN W WO CONTRAST  Result Date: 04/13/2022 CLINICAL DATA:  Initial evaluation for TIA. EXAM: MRI HEAD WITHOUT AND WITH CONTRAST TECHNIQUE: Multiplanar, multiecho pulse sequences of the brain and surrounding structures were obtained without and with intravenous contrast. CONTRAST:  70m GADAVIST GADOBUTROL 1 MMOL/ML IV SOLN COMPARISON:  Prior CT from 04/11/2022 and MRI from 04/01/2022. FINDINGS: Brain: Diffuse prominence of the CSF containing spaces compatible with generalized cerebral atrophy. Patchy T2/FLAIR hyperintensity involving the periventricular and deep white matter both cerebral hemispheres, consistent with chronic small vessel ischemic disease. Multiple remote infarcts involving  the bilateral cerebral hemispheres again noted. Scattered remote lacunar infarcts about the left greater than right basal ganglia and thalami. Small remote pontine and left cerebellar infarcts noted. Scattered multifocal restricted diffusion seen involving the cortical and subcortical aspect of both cerebral hemispheres, consistent with evolving ischemic infarcts. While some of these are subacute in nature related to previously identified infarcts seen on prior brain MRI from 03/22/2022, there are multiple new areas of acute infarction seen since prior exam. These are most pronounced at the right frontal operculum (series 5, image 90), bilateral occipital lobes (series 5, image 76), and right temporal lobe (series 5, image 75). Patchy involvement of the  left greater than right basal ganglia of and splenium. New small acute ischemic left cerebellar infarct (series 5, image 69). Associated mild scattered petechial blood products within the right frontal and occipital lobes (series 11, images 34, 24). No frank hemorrhagic transformation or significant regional mass effect. No mass lesion or midline shift. Ventricles within normal limits without hydrocephalus. No extra-axial fluid collection. Pituitary gland and suprasellar region within normal limits. Mild enhancement noted about the evolving subacute right MCA territory infarct involving the posterior right frontal region (series 19, image 14). No other abnormal enhancement. Vascular: Loss of normal flow void within the left ICA to the terminus, consistent with known chronic occlusion. Major intracranial vascular flow voids are otherwise maintained. Skull and upper cervical spine: Craniocervical junction within normal limits. Bone marrow signal intensity normal. No scalp soft tissue abnormality. Sinuses/Orbits: Prior bilateral ocular lens replacement. Left gaze preference noted. Scattered mucosal thickening noted about the ethmoidal air cells and maxillary sinuses. Paranasal sinuses are otherwise clear. Trace bilateral mastoid effusions noted, of doubtful significance. Other: None. IMPRESSION: 1. Scattered multifocal ischemic infarcts involving the cortical and subcortical aspect of both cerebral hemispheres as well as the left cerebellum. While some of these are subacute in nature related to the previously identified infarcts seen on prior brain MRI from 03/22/2022, there are multiple new areas of acute infarction, most notably at the right frontal operculum, bilateral occipital lobes, and right temporal lobe. Associated mild scattered petechial blood products without lobes without frank hemorrhagic transformation or significant regional mass effect. 2. Underlying age-related cerebral atrophy with chronic small  vessel ischemic disease, with multiple remote infarcts as above. Electronically Signed   By: Jeannine Boga M.D.   On: 04/13/2022 02:03   CT HEAD WO CONTRAST (5MM)  Result Date: 04/11/2022 CLINICAL DATA:  Stroke, follow-up. EXAM: CT HEAD WITHOUT CONTRAST TECHNIQUE: Contiguous axial images were obtained from the base of the skull through the vertex without intravenous contrast. RADIATION DOSE REDUCTION: This exam was performed according to the departmental dose-optimization program which includes automated exposure control, adjustment of the mA and/or kV according to patient size and/or use of iterative reconstruction technique. COMPARISON:  Head CT 04/09/2022.  MRI brain 04/01/2022. FINDINGS: Brain: No acute intracranial hemorrhage. Unchanged chronic infarcts in the bilateral MCA territories. Unchanged lacunar infarcts in the left basal ganglia and thalamus. No hydrocephalus or extra-axial collection. No mass effect or midline shift. Vascular: No hyperdense vessel or unexpected calcification. Skull: No calvarial fracture or suspicious bone lesion. Skull base is unremarkable. Sinuses/Orbits: Unremarkable. Other: None. IMPRESSION: 1. No acute intracranial hemorrhage. 2. Unchanged chronic infarcts in the bilateral MCA territories. Unchanged lacunar infarcts in the left basal ganglia and thalamus. Electronically Signed   By: Emmit Alexanders M.D.   On: 04/11/2022 14:15   Overnight EEG with video  Result Date: 04/10/2022 Lora Havens, MD  04/11/2022 10:03 AM Patient Name: JADIN NOVELLO MRN: [id number] Epilepsy Attending: Lora Havens Referring Physician/Provider: Lorenza Chick, MD Duration: 04/09/2022 1352 to 04/10/2022 1352  Patient history: 77yo m with h/o recent right M2 occlusion s/p thrombectomy who ws noted to be  nonverbal, very weak on the left side, with much worse speech than his baseline.  His symptoms were gradually improving during transport. Later during neurology evaluation, he had  intermittent forced right gaze concerning for focal seizures. EEG to evaluate for seizure  Level of alertness: Awake, asleep  AEDs during EEG study: LEV  Technical aspects: This EEG study was done with scalp electrodes positioned according to the 10-20 International system of electrode placement. Electrical activity was reviewed with band pass filter of 1-'70Hz'$ , sensitivity of 7 uV/mm, display speed of 97m/sec with a '60Hz'$  notched filter applied as appropriate. EEG data were recorded continuously and digitally stored.  Video monitoring was available and reviewed as appropriate.  Description: No clear posterior dominant rhythm was seen. Sleep was characterized by vertex waves, sleep spindles ( 12-'14hz'$ ), maximal fronto-central region. EEG showed continuous generalized and maximal bitemporal 3 to 6 Hz theta-delta slowing. Photic stimulation and hyperventilation were not performed.    ABNORMALITY - Continuous slow, generalized and maximal bitemporal  IMPRESSION: This study is suggestive of cortical dysfunction in bitemporal region likely secondary to underlying strokes. Additionally there is moderate diffuse encephalopathy, nonspecific etiology. No seizures or epileptiform discharges were seen throughout the recording.  Please note lack of epileptiform activity during interictal EEG does not exclude the diagnosis of epilepsy.  [place] WO CONTRAST (5MM[place]  Result Date: 04/09/2022 CLINICAL DATA:  Subarachnoid hemorrhage EXAM: CT HEAD WITHOUT CONTRAST TECHNIQUE: Contiguous axial images were obtained from the base of the skull through the vertex without intravenous contrast. RADIATION DOSE REDUCTION: This exam was performed according to the departmental dose-optimization program which includes automated exposure control, adjustment of the mA and/or kV according to patient size and/or use of iterative reconstruction technique. COMPARISON:  None Available. FINDINGS: Brain: There is no mass, hemorrhage or  extra-axial collection. There is generalized atrophy without lobar predilection. Hypodensity of the white matter is most commonly associated with chronic microvascular disease. Old infarcts of both temporal lobes. Vascular: Atherosclerotic calcification of the internal carotid arteries at the skull base. No abnormal hyperdensity of the major intracranial arteries or dural venous sinuses. Skull: The visualized skull base, calvarium and extracranial soft tissues are normal. Sinuses/Orbits: No fluid levels or advanced mucosal thickening of the visualized paranasal sinuses. No mastoid or middle ear effusion. The orbits are normal. IMPRESSION: 1. No acute intracranial abnormality. 2. Old infarcts of both temporal lobes and findings of chronic microvascular disease. Electronically Signed   By: KUlyses JarredM.D.   On: 04/09/2022 23:16   Chest Port 1 View  Result Date: 04/09/2022 CLINICAL DATA:  Subarachnoid hemorrhage. EXAM: PORTABLE CHEST 1 VIEW COMPARISON:  AP chest 03/29/2021, chest two views 12/02/2019 FINDINGS: Single frontal view of the chest. Low lung volumes. There is technical magnification of the cardiac silhouette which does appear enlarged. Mediastinal contours are not well evaluated due to lung volumes and technique. Mild chronic bilateral interstitial thickening. No definite focal airspace opacity. No large pleural effusion. No pneumothorax. No acute skeletal abnormality. IMPRESSION: 1. Limited evaluation due to low lung volumes, patient rotation, and single AP view. 2. No definite acute cardiopulmonary disease. Electronically Signed   By: RYvonne KendallM.D.   On: 04/09/2022 13:30   CT ANGIO  HEAD NECK W WO CM (CODE STROKE)  Result Date: 04/09/2022 CLINICAL DATA:  77 year old male code stroke presentation. History of occluded left ICA. EXAM: CT ANGIOGRAPHY HEAD AND NECK TECHNIQUE: Multidetector CT imaging of the head and neck was performed using the standard protocol during bolus administration of  intravenous contrast. Multiplanar CT image reconstructions and MIPs were obtained to evaluate the vascular anatomy. Carotid stenosis measurements (when applicable) are obtained utilizing NASCET criteria, using the distal internal carotid diameter as the denominator. RADIATION DOSE REDUCTION: This exam was performed according to the departmental dose-optimization program which includes automated exposure control, adjustment of the mA and/or kV according to patient size and/or use of iterative reconstruction technique. CONTRAST:  1m OMNIPAQUE IOHEXOL 350 MG/ML SOLN COMPARISON:  Recent CTA head and neck 04/02/2022. Plain head CT earlier today. FINDINGS: CTA NECK Skeleton: Absent dentition. No acute osseous abnormality identified. Upper chest: No acute finding. Other neck: No acute finding. Aortic arch: Stable aortic arch, 4 vessel configuration with separate origin of the left vertebral. Mild Calcified aortic atherosclerosis. Right carotid system: Tortuous brachiocephalic artery and right CCA origin with mild plaque and no significant stenosis. Possible previous right carotid endarterectomy. Stable right ICA bulb plaque and irregularity with no hemodynamically significant stenosis. Stable tortuosity of the right ICA distal to the bulb. Left carotid system: Chronically occluded left ICA origin without reconstitution to the skull base. Vertebral arteries: Proximal right subclavian artery tortuosity and calcified plaque with up to 50% stenosis appears stable. The right vertebral artery origin is chronically occluded, with no reconstitution to the skull base. Left vertebral artery is patent and arises directly from the arch. The left vertebral is patent to the skull base, mildly diminutive but without significant plaque or stenosis. CTA HEAD Posterior circulation: Chronically occluded distal right vertebral artery, the left supplies the basilar. Left PICA origin remains patent. Patent basilar artery. Patent right AICA  origin. Fetal type bilateral PCA origins and diminutive basilar tip appear unchanged. SCA origins remain patent. Bilateral PCA branches are stable with mild irregularity. Anterior circulation: Right ICA siphon is patent with moderate calcified plaque but only mild siphon stenosis. Normal right posterior communicating artery origin. Patent right carotid terminus. Patent right MCA and ACA origins. Normal anterior communicating artery. Patent left ICA terminus, left ACA and MCA origins, and left posterior communicating artery which appears stable. Mild left MCA origin irregularity and stenosis again noted. Bilateral ACA branches are stable and within normal limits. Right MCA M1 segment and bifurcation are patent without stenosis. Left MCA M1 segment and trifurcation remains patent. Bilateral MCA branches are stable. Chronic right M3/M4 calcified plaque again noted. Venous sinuses: Early contrast timing, not well evaluated. Anatomic variants: Fetal type PCA origins. Left vertebral artery arises directly from the arch. Review of the MIP images confirms the above findings IMPRESSION: 1. Negative for emergent large vessel occlusion. 2. Stable CTA demonstrating chronically occluded Left ICA and Right Vertebral Artery. Left ICA terminus and fetal type Left PCA origin are reconstituted, probably via the [employer]. 3. Stable right carotid atherosclerosis without hemodynamically significant stenosis. Left vertebral artery supplies the basilar and arises directly from the aortic arch. Electronically Signed   By: HGenevie AnnM.D.   On: 04/09/2022 12:50   CT HEAD CODE STROKE WO CONTRAST  Result Date: 04/09/2022 CLINICAL DATA:  Code stroke. Stroke suspected. No additional history provided. EXAM: CT HEAD WITHOUT CONTRAST TECHNIQUE: Contiguous axial images were obtained from the base of the skull through the vertex without intravenous contrast. RADIATION DOSE REDUCTION:  This exam was performed according to the departmental  dose-optimization program which includes automated exposure control, adjustment of the mA and/or kV according to patient size and/or use of iterative reconstruction technique. COMPARISON:  CT Head 04/01/22, MR Head 04/01/22 FINDINGS: Brain: Redemonstrated are chronic appearing infarcts in the bilateral MCA territory, unchanged prior exam. There is no definite CT evidence of a new cortical infarct. Compared to prior exam there are multiple new cortical calcific densities, for example in the right frontal lobe (series 2, image 22, 20), left parietal lobe (series 2, image 19, 18). These calcific densities are in regions of infarcts seen on prior MRI brain dated 04/01/2022. There is also new subarachnoid hemorrhage along the right occipital lobe (series 6, image 18), which is new compared to prior brain MRI. No hydrocephalus. Vascular: Atherosclerotic calcifications of the carotid siphons bilaterally. No disproportionately hyperdense vessel is visualized. Skull: Normal. Negative for fracture or focal lesion. Sinuses/Orbits: Bilateral lens replacement. No middle ear or mastoid effusion. There is abnormal soft tissue in the right EAC abutting the tympanic membrane, which could represent a cholesteatoma. This has increased compared to 2017. Recommend correlation with direct visualization. Other: None. IMPRESSION: 1. New subarachnoid hemorrhage along the right occipital lobe. 2. Multiple cortical calcific densities in the right frontal lobe, left parietal lobe, and right occipital lobe, which are in regions of infarcts seen on prior MRI brain dated 04/01/2022. 3. No definite CT evidence of a new cortical infarct. 4. Abnormal soft tissue in the right EAC abutting the tympanic membrane, which could represent a cholesteatoma. This has increased compared to 2017. Recommend correlation with direct visualization. Findings were paged to Dr. Curly Shores on 04/09/22 at 12:22 PM via [facility] paging system. Electronically Signed   By: Marin Roberts M.D.   On: 04/09/2022 12:22   EP PPM/ICD IMPLANT  Result Date: 04/05/2022 CONCLUSIONS:  1. Successful implantation of a implantable loop recorder for Cryptogenic stroke  2. No early apparent complications.   VAS [place] LOWER EXTREMITY VENOUS (DVT)  Result Date: 04/03/2022  Lower Venous DVT Study Patient Name:  [facility]  Date of Exam:   04/03/2022 Medical Rec #: [id number]      Accession #:    [id number] Date of Birth: 08/05/1945     Patient Gender: M Patient Age:   45 years Exam Location:  [facility] Procedure:      VAS [place] LOWER EXTREMITY VENOUS (DVT) Referring Phys: Cornelius Moras XU --------------------------------------------------------------------------------  Indications: Stroke.  Risk Factors: None identified. Limitations: Poor ultrasound/tissue interface. Comparison Study: No prior studies. Performing Technologist: Oliver Hum RVT  Examination Guidelines: A complete evaluation includes B-mode imaging, spectral Doppler, color Doppler, and power Doppler as needed of all accessible portions of each vessel. Bilateral testing is considered an integral part of a complete examination. Limited examinations for reoccurring indications may be performed as noted. The reflux portion of the exam is performed with the patient in reverse Trendelenburg.  +---------+---------------+---------+-----------+----------+--------------+ RIGHT    CompressibilityPhasicitySpontaneityPropertiesThrombus Aging +---------+---------------+---------+-----------+----------+--------------+ CFV      Full           Yes      Yes                                 +---------+---------------+---------+-----------+----------+--------------+ SFJ      Full                                                        +---------+---------------+---------+-----------+----------+--------------+  FV Prox  Full                                                         +---------+---------------+---------+-----------+----------+--------------+ FV Mid   Full                                                        +---------+---------------+---------+-----------+----------+--------------+ FV DistalFull                                                        +---------+---------------+---------+-----------+----------+--------------+ PFV      Full                                                        +---------+---------------+---------+-----------+----------+--------------+ POP      Full           Yes      Yes                                 +---------+---------------+---------+-----------+----------+--------------+ PTV      Full                                                        +---------+---------------+---------+-----------+----------+--------------+ PERO     Full                                                        +---------+---------------+---------+-----------+----------+--------------+   +---------+---------------+---------+-----------+----------+--------------+ LEFT     CompressibilityPhasicitySpontaneityPropertiesThrombus Aging +---------+---------------+---------+-----------+----------+--------------+ CFV      Full           Yes      Yes                                 +---------+---------------+---------+-----------+----------+--------------+ SFJ      Full                                                        +---------+---------------+---------+-----------+----------+--------------+ FV Prox  Full                                                        +---------+---------------+---------+-----------+----------+--------------+  FV Mid   Full                                                        +---------+---------------+---------+-----------+----------+--------------+ FV DistalFull                                                         +---------+---------------+---------+-----------+----------+--------------+ PFV      Full                                                        +---------+---------------+---------+-----------+----------+--------------+ POP      Full           Yes      Yes                                 +---------+---------------+---------+-----------+----------+--------------+ PTV      Full                                                        +---------+---------------+---------+-----------+----------+--------------+ PERO     Full                                                        +---------+---------------+---------+-----------+----------+--------------+     Summary: RIGHT: - There is no evidence of deep vein thrombosis in the lower extremity.  - No cystic structure found in the popliteal fossa.  LEFT: - There is no evidence of deep vein thrombosis in the lower extremity.  - No cystic structure found in the popliteal fossa.  *See table(s) above for measurements and observations. Electronically signed by Deitra Mayo MD on 04/03/2022 at 3:39:57 PM.    Final    CT CHEST ABDOMEN PELVIS W CONTRAST  Result Date: 04/02/2022 CLINICAL DATA:  Occult malignancy? EXAM: CT CHEST, ABDOMEN, AND PELVIS WITH CONTRAST TECHNIQUE: Multidetector CT imaging of the chest, abdomen and pelvis was performed following the standard protocol during bolus administration of intravenous contrast. RADIATION DOSE REDUCTION: This exam was performed according to the departmental dose-optimization program which includes automated exposure control, adjustment of the mA and/or kV according to patient size and/or use of iterative reconstruction technique. CONTRAST:  100 mL OMNIPAQUE IOHEXOL 350 MG/ML SOLN COMPARISON:  CT chest 05/14/2021 FINDINGS: CT CHEST FINDINGS Cardiovascular: No significant vascular findings. Normal heart size. No pericardial effusion. Atheromatous calcifications coronary arteries and aorta.  Mediastinum/Nodes: No enlarged mediastinal, hilar, or axillary lymph nodes. Thyroid gland is not visualized. Other mediastinal structures including trachea and esophagus are grossly unremarkable. Large paraesophageal hiatal hernia. Lungs/Pleura: Dependent bibasilar subsegmental atelectasis. No pulmonary edema or pneumonia identified. Musculoskeletal:  No chest wall mass or suspicious bone lesions identified. CT ABDOMEN PELVIS FINDINGS Hepatobiliary: Subcentimeter hepatic cyst right lobe posteriorly at the dome and no biliary ductal dilatation. Unremarkable gallbladder which contains excreted contrast. Pancreas: Unremarkable. No pancreatic ductal dilatation or surrounding inflammatory changes. Spleen: Normal in size without focal abnormality. Adrenals/Urinary Tract: Adrenal glands are unremarkable. Right kidney 1.6 cm cyst. This does not need to be evaluated any further. No hydronephrosis. No nephrolithiasis. No ureteral stones. Urinary bladder contains excreted contrast. Stomach/Bowel: No bowel dilatation. No evidence for obstruction. Oral contrast progressed to the splenic flexure. Stomach is within normal limits. Appendix appears normal. No evidence of bowel wall thickening, distention, or inflammatory changes. Vascular/Lymphatic: Aortic atherosclerosis. No enlarged abdominal or pelvic lymph nodes. Reproductive: Prostate is unremarkable. Other: No abdominal wall hernia or abnormality. No abdominopelvic ascites. Musculoskeletal: No acute or significant osseous findings. IMPRESSION: 1. Large paraesophageal hiatal hernia. 2. Right kidney cyst. 3. Hepatic cysts. 4. No acute abdominal or pelvic pathology and no cardiopulmonary process. Electronically Signed   By: Sammie Bench M.D.   On: 04/02/2022 14:42   EEG adult  Result Date: 04/02/2022 Lora Havens, MD     04/02/2022  8:43 AM Patient Name: TAGGERT CONSTANTINIDES MRN: [id number] Epilepsy Attending: Lora Havens Referring Physician/Provider: Donnetta Simpers,  MD Date: 04/02/2022 Duration: 27.40 mins Patient history: 77 y.o. male with PMH significant for receurrent strokes/TIA, CRAO, peripheral vascular disease, HTN, HLD, recent admission for R MCA stroke 2/2 calcified R MCA M2 embolus thought to be secondary to riht ICA therosclerosis and was discharged on Aspirin and plavix and enrolled in [place] trial who presents with intermittent episodes of confusion/word finding difficulty x 3. EEG to evaluate for seizure Level of alertness: Awake AEDs during EEG study: None Technical aspects: This EEG study was done with scalp electrodes positioned according to the 10-20 International system of electrode placement. Electrical activity was reviewed with band pass filter of 1-'70Hz'$ , sensitivity of 7 uV/mm, display speed of 55m/sec with a '60Hz'$  notched filter applied as appropriate. EEG data were recorded continuously and digitally stored.  Video monitoring was available and reviewed as appropriate. Description: The posterior dominant rhythm consists of 8 Hz activity of moderate voltage (25-35 uV) seen predominantly in posterior head regions, symmetric and reactive to eye opening and eye closing. EEG showed intermittent generalized 5 to 6 Hz theta slowing. Physiologic photic driving was not seen during photic stimulation.  Hyperventilation was not performed.   ABNORMALITY - Intermittent slow, generalized IMPRESSION: This study is suggestive of mild diffuse encephalopathy, nonspecific etiology. No seizures or epileptiform discharges were seen throughout the recording. Please note lack of epileptiform activity during interictal EEG does not exclude the diagnosis of epilepsy. PLora Havens  CT ANGIO HEAD NECK W WO CM  Result Date: 04/02/2022 CLINICAL DATA:  Sudden onset generalized weakness EXAM: CT ANGIOGRAPHY HEAD AND NECK TECHNIQUE: Multidetector CT imaging of the head and neck was performed using the standard protocol during bolus administration of intravenous contrast.  Multiplanar CT image reconstructions and MIPs were obtained to evaluate the vascular anatomy. Carotid stenosis measurements (when applicable) are obtained utilizing NASCET criteria, using the distal internal carotid diameter as the denominator. RADIATION DOSE REDUCTION: This exam was performed according to the departmental dose-optimization program which includes automated exposure control, adjustment of the mA and/or kV according to patient size and/or use of iterative reconstruction technique. CONTRAST:  712mOMNIPAQUE IOHEXOL 350 MG/ML SOLN COMPARISON:  03/06/2022 CTA head and neck, correlation is also made with  CT head 04/01/2022 FINDINGS: CT HEAD FINDINGS For noncontrast findings, please see 04/01/2022 CT head. CTA NECK FINDINGS Aortic arch: 3 arch, with a common origin of the brachiocephalic and left common carotid arteries and aortic origin of the left vertebral artery. Imaged portion shows no evidence of aneurysm or dissection. No significant stenosis of the major arch vessel origins. Right carotid system: No evidence of dissection, occlusion, or hemodynamically significant stenosis (greater than 50%). Redemonstrated ulcerated plaque at the bifurcation and in the proximal right ICA. Left carotid system: Redemonstrated occlusion of the left ICA at its origin. The left CCA and ECA are patent. Vertebral arteries: Chronically occluded right vertebral artery. The left vertebral artery is patent, without significant stenosis, dissection, or occlusion. Skeleton: No acute osseous abnormality. Other neck: No acute finding. Upper chest: No focal pulmonary opacity or pleural effusion. Review of the MIP images confirms the above findings CTA HEAD FINDINGS Anterior circulation: Redemonstrated chronically occluded left ICA, with reconstitution at the terminus. The right ICA is patent to the terminus. A1 segments patent. Normal anterior communicating artery. Anterior cerebral arteries are patent to their distal aspects. No  M1 stenosis or occlusion. A previously noted occluded right MCA branch in the area of a likely calcific embolus (series 7, image 87), is now patent (series 7, image 84). To their MCA branches perfused distal aspects. Posterior circulation: Chronically occluded right vertebral artery. The left vertebral artery is patent to the vertebrobasilar junction. The left PICA is patent proximally. Basilar patent to its distal aspect. Superior cerebellar arteries patent proximally. Fetal origin of the left PCA and near fetal origin of the right PCA. The PCAs are patent to their distal aspects without significant stenosis. Venous sinuses: Not well opacified. Anatomic variants: Fetal origin of the left PCA and near fetal origin of the right PCA. Review of the MIP images confirms the above findings IMPRESSION: 1. Redemonstrated chronically occluded left ICA, with reconstitution at the terminus, and chronically occluded right vertebral artery. 2. A previously noted occluded right MCA branch in the area of a likely calcific embolus is now patent. 3. No other intracranial large vessel occlusion or significant stenosis. 4. No additional hemodynamically significant stenosis in the neck. Redemonstrated ulcerated plaque at the right carotid bifurcation and in the proximal right ICA. Electronically Signed   By: Merilyn Baba M.D.   On: 04/02/2022 00:26   MR BRAIN WO CONTRAST  Result Date: 04/01/2022 CLINICAL DATA:  Transient episodes of confusion and aphasia, recent stroke EXAM: MRI HEAD WITHOUT CONTRAST TECHNIQUE: Multiplanar, multiecho pulse sequences of the brain and surrounding structures were obtained without intravenous contrast. COMPARISON:  03/07/2022 MRI head, correlation is also made with 04/01/2022 CT head FINDINGS: Evaluation is limited by motion brain: Scattered, primarily cortical foci of restricted diffusion with ADC correlates in the bilateral cerebral hemispheres (series 2, images 21-23, 26-29, 34-35, 37-41, 43-44,  and 46), as well as in the right caudate head and left external capsule (series 2, image 31), which are new from the prior MRI and likely reflect represent acute to subacute infarcts. Some restricted diffusion with diminished ADC correlate remains in the area of prior infarct in the posterior right frontal and anterior right parietal cortex, consistent with subacute, evolving infarct. No acute hemorrhage, mass, mass effect, or midline shift. No hydrocephalus or definite extra-axial collection. Redemonstrated remote infarcts in the left corona radiata, bilateral parietal cortex, and posterior right temporal cortex. Vascular: Limited evaluation secondary to motion. Skull and upper cervical spine: Limited evaluation secondary to motion. Sinuses/Orbits:  No acute finding. Other: Fluid in bilateral mastoid air cells. IMPRESSION: 1. Evaluation is limited by motion. Within this limitation, there are scattered foci of restricted diffusion in the bilateral cerebral hemispheres, as well as in the right caudate head and left external capsule, which are new from the prior MRI and likely reflect acute to early subacute infarcts. Given multiple vascular territories, a central embolic etiology is likely. 2. Evolving subacute, evolving infarct in the posterior right frontal and anterior right parietal cortex. These results were called by telephone at the time of interpretation on 04/01/2022 at 11:00 pm to provider MATTHEW TRIFAN , who verbally acknowledged these results. Electronically Signed   By: Merilyn Baba M.D.   On: 04/01/2022 23:00   CT HEAD WO CONTRAST  Result Date: 04/01/2022 CLINICAL DATA:  Neuro deficit, acute, stroke suspected EXAM: CT HEAD WITHOUT CONTRAST TECHNIQUE: Contiguous axial images were obtained from the base of the skull through the vertex without intravenous contrast. RADIATION DOSE REDUCTION: This exam was performed according to the departmental dose-optimization program which includes automated exposure  control, adjustment of the mA and/or kV according to patient size and/or use of iterative reconstruction technique. COMPARISON:  CT head 03/06/2022, MRI head 03/29/2021 FINDINGS: Brain: Cerebral ventricle sizes are concordant with the degree of cerebral volume loss. Patchy and confluent areas of decreased attenuation are noted throughout the deep and periventricular white matter of the cerebral hemispheres bilaterally, compatible with chronic microvascular ischemic disease. Left frontoparietal and anterior temporal lobe encephalomalacia. Right temporal lobe encephalomalacia. No evidence of large-territorial acute infarction. No parenchymal hemorrhage. No mass lesion. No extra-axial collection. No mass effect or midline shift. No hydrocephalus. Basilar cisterns are patent. Vascular: No hyperdense vessel. Atherosclerotic calcifications are present within the cavernous internal carotid arteries. Skull: No acute fracture or focal lesion. Sinuses/Orbits: Paranasal sinuses and mastoid air cells are clear. The orbits are unremarkable. Other: None. IMPRESSION: No acute intracranial abnormality in a patient with chronic bilateral cerebral infarction. Electronically Signed   By: Iven Finn M.D.   On: 04/01/2022 19:11     Subjective: - no chest pain, shortness of breath, no abdominal pain, nausea or vomiting.   Discharge Exam: BP (!) 145/81 (BP Location: Left Arm)   Pulse (!) 57   Temp 98.2 F (36.8 C)   Resp 18   Ht '5\' 5"'$  (1.651 m)   Wt 79.4 kg   SpO2 98%   BMI 29.13 kg/m   General: Pt is confused   The results of significant diagnostics from this hospitalization (including imaging, microbiology, ancillary and laboratory) are listed below for reference.     Microbiology: Recent Results (from the past 240 hour(s))  MRSA Next Gen by PCR, Nasal     Status: None   Collection Time: 04/09/22  1:18 PM   Specimen: Nasal Mucosa; Nasal Swab  Result Value Ref Range Status   MRSA by PCR Next Gen NOT  DETECTED NOT DETECTED Final    Comment: (NOTE) The GeneXpert MRSA Assay (FDA approved for NASAL specimens only), is one component of a comprehensive MRSA colonization surveillance program. It is not intended to diagnose MRSA infection nor to guide or monitor treatment for MRSA infections. Test performance is not FDA approved in patients less than 21 years old. Performed at [facility], [place] [address], [place], [place] [address]      Labs: Basic Metabolic Panel: Recent Labs  Lab 04/09/22 1203 04/09/22 1208 04/11/22 0331 04/12/22 0423 04/13/22 0405 04/14/22 0452  NA 137 141 139  139 140 139  K 4.5 4.6 4.0 4.1 3.5 3.2*  CL 101 101 107 105 111 107  CO2 30  --  22 22 21* 23  GLUCOSE 95 93 91 88 69* 91  BUN 20 28* '15 16 14 8  '$ CREATININE 1.26* 1.20 1.02 0.94 0.94 0.83  CALCIUM 8.7*  --  9.0 9.1 8.5* 8.7*  MG  --   --  2.1 2.2 1.9  --   PHOS  --   --  2.8 2.5 2.3*  --    Liver Function Tests: Recent Labs  Lab 04/09/22 1203  AST 36  ALT 41  ALKPHOS 77  BILITOT 1.3*  PROT 5.9*  ALBUMIN 3.3*   CBC: Recent Labs  Lab 04/09/22 1203 04/09/22 1208 04/11/22 0331 04/12/22 0423 04/13/22 0405 04/14/22 0452  WBC 6.1  --  6.8 8.4 6.3 7.3  NEUTROABS 4.5  --   --   --   --   --   HGB 13.1 13.9 14.0 13.2 12.3* 13.5  HCT 40.1 41.0 42.3 40.0 36.7* 40.2  MCV 96.6  --  94.0 94.3 93.9 92.6  PLT 263  --  245 258 236 259   CBG: Recent Labs  Lab 04/09/22 1203 04/09/22 1234  GLUCAP 94 97   Hgb A1c No results for input(s): "HGBA1C" in the last 72 hours. Lipid Profile No results for input(s): "CHOL", "HDL", "LDLCALC", "TRIG", "CHOLHDL", "LDLDIRECT" in the last 72 hours. Thyroid function studies No results for input(s): "TSH", "T4TOTAL", "T3FREE", "THYROIDAB" in the last 72 hours.  Invalid input(s): "FREET3" Urinalysis    Component Value Date/Time   COLORURINE YELLOW 03/06/2022 1910   APPEARANCEUR CLEAR 03/06/2022 1910   LABSPEC >1.046 (H) 03/06/2022 1910   PHURINE  5.0 03/06/2022 1910   GLUCOSEU NEGATIVE 03/06/2022 1910   HGBUR NEGATIVE 03/06/2022 1910   BILIRUBINUR NEGATIVE 03/06/2022 1910   KETONESUR NEGATIVE 03/06/2022 1910   PROTEINUR NEGATIVE 03/06/2022 1910   NITRITE NEGATIVE 03/06/2022 1910   LEUKOCYTESUR MODERATE (A) 03/06/2022 1910    FURTHER DISCHARGE INSTRUCTIONS:   Get Medicines reviewed and adjusted: Please take all your medications with you for your next visit with your Primary MD   Laboratory/radiological data: Please request your Primary MD to go over all hospital tests and procedure/radiological results at the follow up, please ask your Primary MD to get all Hospital records sent to his/her office.   In some cases, they will be blood work, cultures and biopsy results pending at the time of your discharge. Please request that your primary care M.D. goes through all the records of your hospital data and follows up on these results.   Also Note the following: If you experience worsening of your admission symptoms, develop shortness of breath, life threatening emergency, suicidal or homicidal thoughts you must seek medical attention immediately by calling 911 or calling your MD immediately  if symptoms less severe.   You must read complete instructions/literature along with all the possible adverse reactions/side effects for all the Medicines you take and that have been prescribed to you. Take any new Medicines after you have completely understood and accpet all the possible adverse reactions/side effects.    Do not drive when taking Pain medications or sleeping medications (Benzodaizepines)   Do not take more than prescribed Pain, Sleep and Anxiety Medications. It is not advisable to combine anxiety,sleep and pain medications without talking with your primary care practitioner   Special Instructions: If you have smoked or chewed Tobacco  in the last  2 yrs please stop smoking, stop any regular Alcohol  and or any Recreational drug  use.   Wear Seat belts while driving.   Please note: You were cared for by a hospitalist during your hospital stay. Once you are discharged, your primary care physician will handle any further medical issues. Please note that NO REFILLS for any discharge medications will be authorized once you are discharged, as it is imperative that you return to your primary care physician (or establish a relationship with a primary care physician if you do not have one) for your post hospital discharge needs so that they can reassess your need for medications and monitor your lab values.  Time coordinating discharge: 35 minutes  SIGNED:  Marzetta Board, MD, PhD 04/15/2022, 9:53 AM

## 2022-04-15 NOTE — Progress Notes (Signed)
Daily Progress Note   Patient Name: James Richards       Date: 04/15/2022 DOB: 05-09-1945  Age: 77 y.o. MRN#: [id number] Attending Physician: Caren Griffins, MD Primary Care Physician: Pcp, No Admit Date: 04/09/2022  Reason for Consultation/Follow-up: Establishing goals of care  Subjective: "I feel fine"  Length of Stay: 6  Current Medications: Scheduled Meds:   antiseptic oral rinse  15 mL Topical TID   fluticasone furoate-vilanterol  1 puff Inhalation Daily   levothyroxine  125 mcg Oral Q0600   metoprolol tartrate  12.5 mg Oral BID   mouth rinse  15 mL Mouth Rinse 4 times per day   rOPINIRole  2 mg Oral QHS   senna-docusate  1 tablet Oral BID    Continuous Infusions:   PRN Meds: acetaminophen **OR** acetaminophen (TYLENOL) oral liquid 160 mg/5 mL **OR** acetaminophen, glycopyrrolate **OR** glycopyrrolate **OR** glycopyrrolate, haloperidol **OR** haloperidol **OR** haloperidol lactate, ipratropium-albuterol, LORazepam **OR** LORazepam, mouth rinse, polyvinyl alcohol  Physical Exam Constitutional:      General: He is not in acute distress.    Appearance: He is ill-appearing.     Comments: Difficult to understand speech  Pulmonary:     Effort: Pulmonary effort is normal.  Skin:    General: Skin is warm and dry.  Neurological:     Mental Status: He is alert.     Comments: Difficult to assess             Vital Signs: BP (!) 145/81 (BP Location: Left Arm)   Pulse (!) 57   Temp 98.2 F (36.8 C)   Resp 18   Ht '5\' 5"'$  (1.651 m)   Wt 79.4 kg   SpO2 98%   BMI 29.13 kg/m  SpO2: SpO2: 98 % O2 Device: O2 Device: Room Air O2 Flow Rate:    Intake/output summary:  Intake/Output Summary (Last 24 hours) at 04/15/2022 1011 Last data filed at 04/15/2022 0500 Gross per 24 hour  Intake 60  ml  Output 800 ml  Net -740 ml   LBM: Last BM Date : 04/11/22 Baseline Weight: Weight: 79.4 kg Most recent weight: Weight: 79.4 kg       Palliative Assessment/Data: PPS 20%      Patient Active Problem List   Diagnosis Date Noted   Acute embolic stroke ([place]) 123XX123   Middle cerebral artery stenosis, right 03/19/2022   Left hemiparesis ([place]) 03/06/2022   Hypertensive heart disease with congestive heart failure and with combined systolic and diastolic dysfunction ([place]) 03/06/2022   Middle cerebral artery embolism, left 03/06/2022   Hearing loss of both ears due to cerumen impaction 08/24/2021   Otitis media, chronic with perforation 07/27/2021   Otitis media, purulent, acute, with spontaneous rupture of TM 07/27/2021   Central retinal artery occlusion of left eye 03/31/2021   Occlusion of extracranial carotid artery, left 03/31/2021   Hypertensive heart disease with chronic diastolic congestive heart failure ([place]) 06/23/2020   Mixed hearing loss 06/23/2020   Restrictive lung disease 11/26/2019   Other specified hypothyroidism    Hyperlipidemia    Restless legs syndrome    History of TIA (transient ischemic attack) 12/11/2014   Carotid stenosis 11/24/2014   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 Patient found to have acute CVA on MRI done 2/27. Patient with  very poor PO intake. Patient has removed his IVs and doesn't want them replaced. PMT consulted to discuss [place].     Assessment: Follow up today with patient and family. No family at bedside - patient awake and more communicative today. Denies pain. Ate a few bites of breakfast, tells me he doesn't want anymore. Wants to go home. Share with patient the plan is to get him home later today and he tells me "I hope so".  Spoke with James Richards - she has had her questions and concerns addressed by hospice and is comfortable with plan to go home with hospice support. We discussed plan for dc today. All questions and concerns addressed. James Richards has my number and will call with any concerns.  Recommendations/Plan: Dc home today with hospice support  Goals of Care and Additional Recommendations: Limitations on Scope of Treatment: Full Comfort Care  Code Status: DNR  Prognosis: Poor prognosis, PO intake has been poor but fluctuates, at risk for further decline/complications  Discharge Planning: Home with Hospice  Care plan was discussed with Dr. Cruzita Lederer, patient, James Richards  Thank you for allowing the Palliative Medicine Team to assist in the care of this patient.   *Please note that this is a verbal dictation therefore any spelling or grammatical errors are due to the "[employer]" system interpretation.  Juel Burrow, DNP, [facility] Palliative Medicine Team Team Phone # [phone]  Pager [phone]

## 2022-04-15 NOTE — Progress Notes (Signed)
PT Cancellation Note  Patient Details Name: JUELZ NEWSUM MRN: [id number] DOB: 25-Jul-1945   Cancelled Treatment:    Reason Eval/Treat Not Completed: Other (comment); noted order discontinued per palliative care.  PT signed off, consult again if needs arise.   Reginia Naas 04/15/2022, 7:26 AM Magda Kiel, PT Acute Rehabilitation Services Office:[phone] 04/15/2022

## 2022-04-15 NOTE — TOC Transition Note (Signed)
Transition of Care [facility]) - CM/SW Discharge Note   Patient Details  Name: James Richards MRN: [id number] Date of Birth: 1945-09-11  Transition of Care [facility]) CM/SW Contact:  Geralynn Ochs, LCSW Phone Number: 04/15/2022, 3:12 PM   Clinical Narrative:   CSW confirmed with cousin, [name], that DME was delivered and she is ready for patient to come home. CSW updated hospice liaison of transportation time. Transport scheduled with [facility] for next available.    Final next level of care: Home w Hospice Care Barriers to Discharge: Barriers Resolved   Patient Goals and CMS Choice CMS Medicare.gov Compare Post Acute Care list provided to:: Patient Represenative (must comment) Choice offered to / list presented to : [facility] POA / Guardian  Discharge Placement                  Patient to be transferred to facility by: [place] Name of family member notified: [name] Patient and family notified of of transfer: 04/15/22  Discharge Plan and Services Additional resources added to the After Visit Summary for   In-house Referral: Clinical Social Work   Post Acute Care Choice: [place]                               Social Determinants of Health ([place]) Interventions SDOH Screenings   Food Insecurity: No Food Insecurity (04/05/2022)  Housing: Low Risk  (03/08/2022)  Transportation Needs: No Transportation Needs (04/05/2022)  Utilities: Not At Risk (03/08/2022)  Alcohol Screen: Low Risk  (12/27/2021)  Depression (PHQ2-9): Low Risk  (04/05/2022)  Financial Resource Strain: Low Risk  (08/26/2021)  Physical Activity: Inactive (08/26/2021)  Social Connections: Moderately Isolated (08/26/2021)  Stress: No Stress Concern Present (08/26/2021)  Tobacco Use: Medium Risk (04/09/2022)     Readmission Risk Interventions     No data to display

## 2022-04-19 ENCOUNTER — Inpatient Hospital Stay: Payer: Self-pay | Admitting: Family Medicine

## 2022-04-19 NOTE — Progress Notes (Deleted)
[facility] [address] [place]. [place] [address] [phone]       HOSPITAL FOLLOW UP NOTE  James Richards Date of Birth:  [birth year] Medical Record Number:  [id number]   Reason for Referral:  hospital stroke follow up    SUBJECTIVE:   CHIEF COMPLAINT:  No chief complaint on file.   HPI:   James Richards is a 77 y.o. 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  Patient presented on *** with ***. Personally reviewed hospitalization pertinent progress notes, lab work and imaging.  Evaluated by ***.      PERTINENT IMAGING/LABS  MR BRAIN   CTA HEAD/NECK MRA HEAD/NECK   CAROTID ULTRASOUND   2D ECHO   TEE   A1C Lab Results  Component Value Date   HGBA1C 5.7 (H) 03/06/2022    Lipid Panel     Component Value Date/Time   CHOL 98 04/02/2022 0432   CHOL 102 12/27/2021 1153   TRIG 81 04/11/2022 0331   HDL 46 04/02/2022 0432   HDL 45 12/27/2021 1153   CHOLHDL 2.1 04/02/2022 0432   VLDL 9 04/02/2022 0432   LDLCALC 43 04/02/2022 0432   LDLCALC 42 12/27/2021 1153    LABVLDL 15 12/27/2021 1153      ROS:   14 system review of systems performed and negative with exception of those listed in HPI  PMH:  Past Medical History:  Diagnosis Date   Atherosclerosis of native arteries of the extremities with intermittent claudication 10/09/2013   Atherosclerotic peripheral vascular disease with intermittent claudication (HCC) 10/03/2012   BMI 33.0-33.9,adult 07/07/2019   Carotid stenosis 123XX123   Diastolic dysfunction [id number]   Essential hypertension    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   History of recurrent TIAs 12/11/2014   Impacted cerumen 11/26/2019   Malignant neoplasm of base of tongue ([place]) 01/21/2015   Malignant neoplasm of larynx, unspecified (HCC)    Migraine 04/02/2014   Mixed hyperlipidemia    Other fatigue    Other specified hypothyroidism    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    Restless legs syndrome    Restrictive lung disease 11/26/2019   TIA (transient ischemic attack) 11/24/2014   Vision exam with abnormal findings concern for left retinal artery occlusion 03/29/2021    PSH:  Past Surgical History:  Procedure Laterality Date   IR CT HEAD LTD  03/06/2022   IR PERCUTANEOUS ART THROMBECTOMY/INFUSION INTRACRANIAL INC DIAG ANGIO  03/06/2022   LOOP RECORDER INSERTION N/A 04/04/2022   Procedure: LOOP RECORDER INSERTION;  Surgeon: Vickie Epley, MD;  Location: [facility];  Service: Cardiovascular;  Laterality: N/A;   LYMPHADENECTOMY     PERIPHERAL VASCULAR CATHETERIZATION N/A 11/24/2014   Procedure: Aortic Arch Angiography;  Surgeon: Angelia Mould, MD;  Location: [facility];  Service: Cardiovascular;  Laterality: N/A;   PERIPHERAL VASCULAR CATHETERIZATION N/A 11/24/2014  Procedure: Cerebral Angiography;  Surgeon: Angelia Mould, MD;  Location: [facility];  Service: Cardiovascular;  Laterality: N/A;   RADIOLOGY WITH ANESTHESIA N/A 03/06/2022    Procedure: RADIOLOGY WITH ANESTHESIA;  Surgeon: Radiologist, Medication, MD;  Location: [facility];  Service: Radiology;  Laterality: N/A;   THROAT SURGERY     TONSILECTOMY/ADENOIDECTOMY WITH MYRINGOTOMY      Social History:  Social History   Socioeconomic History   Marital status: Divorced    Spouse name: Not on file   Number of children: Not on file   Years of education: Not on file   Highest education level: Not on file  Occupational History   Not on file  Tobacco Use   Smoking status: Former    Packs/day: 1.00    Years: 41.00    Total pack years: 41.00    Types: Cigarettes    Quit date: 02/15/2007    Years since quitting: 15.1   Smokeless tobacco: Former    Types: Nurse, children's Use: Never used  Substance and Sexual Activity   Alcohol use: No    Alcohol/week: 0.0 standard drinks of alcohol   Drug use: No   Sexual activity: Not Currently    Partners: Female  Other Topics Concern   Not on file  Social History Narrative   Not on file   Social Determinants of Health   Financial Resource Strain: Low Risk  (08/26/2021)   Overall Financial Resource Strain (CARDIA)    Difficulty of Paying Living Expenses: Not hard at all  Food Insecurity: No Food Insecurity (04/05/2022)   Hunger Vital Sign    Worried About Running Out of Food in the Last Year: Never true    [place] in the Last Year: Never true  Transportation Needs: No Transportation Needs (04/05/2022)   PRAPARE - Hydrologist (Medical): No    Lack of Transportation (Non-Medical): No  Physical Activity: Inactive (08/26/2021)   Exercise Vital Sign    Days of Exercise per Week: 0 days    Minutes of Exercise per Session: 0 min  Stress: No Stress Concern Present (08/26/2021)   [place]    Feeling of Stress : Not at all  Social Connections: Moderately Isolated (08/26/2021)   Social Connection and Isolation Panel  [NHANES]    Frequency of Communication with Friends and Family: More than three times a week    Frequency of Social Gatherings with Friends and Family: More than three times a week    Attends Religious Services: More than 4 times per year    Active Member of [employer] or Organizations: No    Attends Archivist Meetings: Never    Marital Status: Divorced  Human resources officer Violence: Not At Risk (03/08/2022)   Humiliation, Afraid, Rape, and Kick questionnaire    Fear of Current or Ex-Partner: No    Emotionally Abused: No    Physically Abused: No    Sexually Abused: No    Family History:  Family History  Problem Relation Age of Onset   Other Father        amputation   Deep vein thrombosis Father    Heart disease Father        Atrial Fib.   Cancer Sister    Hypertension Brother     Medications:   Current Outpatient Medications on File Prior to Visit  Medication Sig Dispense Refill  acetaminophen-codeine (TYLENOL #3) 300-30 MG tablet Take 1 tablet by mouth every 6 (six) hours as needed for moderate pain. TAKE 1 TABLET EVERY 4 HOURS AS NEEDED FOR MODERATE PAIN Strength: 300-30 mg 18 tablet 0   albuterol (VENTOLIN HFA) 108 (90 Base) MCG/ACT inhaler Inhale 1 puff into the lungs as needed for wheezing or shortness of breath.     ascorbic acid (VITAMIN C) 500 MG tablet Take 500 mg by mouth daily.     aspirin EC 81 MG tablet Take 1 tablet (81 mg total) by mouth daily. Swallow whole. FOR ONLY ONE MONTH. 30 tablet 0   atorvastatin (LIPITOR) 80 MG tablet TAKE 1 TABLET EVERY DAY (Patient taking differently: Take 80 mg by mouth daily.) 90 tablet 2   [START ON 05/03/2022] clopidogrel (PLAVIX) 75 MG tablet Take 1 tablet (75 mg total) by mouth daily. TO BE STARTED ON 05/03/22. (Patient not taking: Reported on 04/05/2022) 30 tablet 2   furosemide (LASIX) 40 MG tablet Take 1 tablet (40 mg total) by mouth every other day. (Patient taking differently: Take 40 mg by mouth daily as needed for fluid or  edema.) 90 tablet 2   haloperidol (HALDOL) 2 MG/ML solution Place 1 mL (2 mg total) under the tongue every 4 (four) hours as needed for agitation (or delirium). 15 mL 0   levothyroxine (SYNTHROID) 125 MCG tablet Take 1 tablet (125 mcg total) by mouth daily. 90 tablet 1   LORazepam (ATIVAN) 2 MG/ML concentrated solution Place 0.5 mLs (1 mg total) under the tongue every 4 (four) hours as needed for anxiety. 30 mL 0   metoprolol tartrate (LOPRESSOR) 25 MG tablet Take 1/2 tablet (12.5 mg total) by mouth 2 (two) times daily. 30 tablet 2   minocycline (MINOCIN) 50 MG capsule Take 1 capsule (50 mg total) by mouth daily. 90 capsule 2   Naphazoline HCl (CLEAR EYES OP) Place 1-2 drops into both eyes as needed (dry eye).     Omega-3 Fatty Acids (FISH OIL) 1200 MG CPDR Take 1 capsule by mouth daily.     pantoprazole (PROTONIX) 40 MG tablet Take 1 tablet (40 mg total) by mouth daily. 90 tablet 3   Potassium Bicarbonate 99 MG CAPS Take 99 mg by mouth daily.     rOPINIRole (REQUIP) 2 MG tablet Take 2 mg by mouth at bedtime.     SYMBICORT 160-4.5 MCG/ACT inhaler Inhale 2 puffs into the lungs daily.     ticagrelor (BRILINTA) 90 MG TABS tablet Take 1 tablet (90 mg total) by mouth 2 (two) times daily. 60 tablet 0   vitamin B-12 (CYANOCOBALAMIN) 500 MCG tablet Take 500 mcg by mouth daily.     zinc gluconate 50 MG tablet Take 50 mg by mouth daily.     No current facility-administered medications on file prior to visit.    Allergies:  No Known Allergies    OBJECTIVE:  Physical Exam  There were no vitals filed for this visit. There is no height or weight on file to calculate BMI. No results found.     04/05/2022   11:33 AM  Depression screen PHQ 2/9  Decreased Interest 0  Down, Depressed, Hopeless 1  PHQ - 2 Score 1  Difficult doing work/chores Not difficult at all     General: well developed, well nourished, seated, in no evident distress Head: head normocephalic and atraumatic.   Neck: supple  with no carotid or supraclavicular bruits Cardiovascular: regular rate and rhythm, no murmurs Musculoskeletal: no deformity Skin:  no rash/petichiae Vascular:  Normal pulses all extremities   Neurologic Exam Mental Status: Awake and fully alert.  Fluent speech and language.  Oriented to place and time. Recent and remote memory intact. Attention span, concentration and fund of knowledge appropriate. Mood and affect appropriate.  Cranial Nerves: Fundoscopic exam reveals sharp disc margins. Pupils equal, briskly reactive to light. Extraocular movements full without nystagmus. Visual fields full to confrontation. Hearing intact. Facial sensation intact. Face, tongue, palate moves normally and symmetrically.  Motor: Normal bulk and tone. Normal strength in all tested extremity muscles Sensory.: intact to touch , pinprick , position and vibratory sensation.  Coordination: Rapid alternating movements normal in all extremities. Finger-to-nose and heel-to-shin performed accurately bilaterally. Gait and Station: Arises from chair without difficulty. Stance is normal. Gait demonstrates normal stride length and balance with ***. Tandem walk and heel toe ***.  Reflexes: 1+ and symmetric.    NIHSS  *** Modified [place]  ***    ASSESSMENT: James Richards is a 77 y.o. year old male ***. Vascular risk factors include ***.      PLAN:  *** : Residual deficit: ***. Continue {anticoagulants:31417}  and ***  for secondary stroke prevention.  Discussed secondary stroke prevention measures and importance of close PCP follow up for aggressive stroke risk factor management. I have gone over the pathophysiology of stroke, warning signs and symptoms, risk factors and their management in some detail with instructions to go to the closest emergency room for symptoms of concern. HTN: BP goal <130/90.  Stable on *** per PCP HLD: LDL goal <70. Recent LDL ***.  DMII: A1c goal<7.0. Recent A1c ***.    Follow up in ***  or call earlier if needed   CC:  [facility] provider: Dr. Leonie Man PCP: Pcp, No    I spent *** minutes of face-to-face and non-face-to-face time with patient.  This included previsit chart review including review of recent hospitalization, lab review, study review, order entry, electronic health record documentation, patient education regarding recent stroke including etiology, secondary stroke prevention measures and importance of managing stroke risk factors, residual deficits and typical recovery time and answered all other questions to patient satisfaction   Debbora Presto, [facility]  [facility] [address] [place], [place] [address]  Phone [phone] Fax [phone] Note: This document was prepared with digital dictation and possible smart phrase technology. Any transcriptional errors that result from this process are unintentional.

## 2022-05-02 ENCOUNTER — Ambulatory Visit: Payer: Medicare PPO | Admitting: Family Medicine

## 2022-05-06 ENCOUNTER — Ambulatory Visit: Payer: Medicare PPO

## 2022-05-13 LAB — CUP PACEART REMOTE DEVICE CHECK
Date Time Interrogation Session: 20240329020149
Implantable Pulse Generator Implant Date: 20240219
Pulse Gen Serial Number: 511024465

## 2022-05-17 ENCOUNTER — Telehealth: Payer: Self-pay | Admitting: Cardiology

## 2022-05-17 NOTE — Telephone Encounter (Signed)
Spoke to [name], states Dr. Otelia Santee put the patient Eliquis 5 mg BID, stopped ASA and Plavix. Advised I will forward to Dr. Quentin Ore. Also per Davy Pique MD does not need follow up visit to discuss.

## 2022-05-17 NOTE — Telephone Encounter (Signed)
James Richards with hospice is following up requesting a call back from RN to provide updates.

## 2022-05-17 NOTE — Telephone Encounter (Signed)
Spoke to [facility] nurse. Advised Carly spoke to family and they will discuss and reach out about what they would like to do based on Dr. Claudie Revering recommendations. States she will speak to patients hospice provider and family and call back later. Appreciative of f/u call.

## 2022-05-17 NOTE — Telephone Encounter (Signed)
Pt hospice RN calling back for recommendations from device check on 05/13/22

## 2022-05-18 NOTE — Telephone Encounter (Signed)
Spoke with James Richards informed her that patients first billable was Friday so that likely isnt enough time for insurance to process, informed her that if the monthly billing is too much to call back and we can D/C monthly billing and only monitor for alerts. Informed her that patient doesn't have to get loop recorder taken out in order to discontinue the monthly billing. She voiced understanding

## 2022-05-18 NOTE — Telephone Encounter (Signed)
Patient POA James Richards called in wanting to know the exact amount it costs for remotes. I have given her the code we use for billing so she can call her health insurance. She also wants his ILR removed since he is now on the correct medications he doesn't need it anymore. She wants it done in the [place] office I let her know we dont do them there. She would like a call back to go over what can be done going forward

## 2022-06-06 DIAGNOSIS — G2581 Restless legs syndrome: Secondary | ICD-10-CM

## 2022-06-06 DIAGNOSIS — I110 Hypertensive heart disease with heart failure: Secondary | ICD-10-CM

## 2022-06-06 DIAGNOSIS — I5032 Chronic diastolic (congestive) heart failure: Secondary | ICD-10-CM

## 2022-06-06 DIAGNOSIS — H908 Mixed conductive and sensorineural hearing loss, unspecified: Secondary | ICD-10-CM

## 2022-06-06 DIAGNOSIS — I2510 Atherosclerotic heart disease of native coronary artery without angina pectoris: Secondary | ICD-10-CM

## 2022-06-06 DIAGNOSIS — I69328 Other speech and language deficits following cerebral infarction: Secondary | ICD-10-CM

## 2022-06-06 DIAGNOSIS — E039 Hypothyroidism, unspecified: Secondary | ICD-10-CM

## 2022-06-06 DIAGNOSIS — I739 Peripheral vascular disease, unspecified: Secondary | ICD-10-CM

## 2022-06-06 DIAGNOSIS — E669 Obesity, unspecified: Secondary | ICD-10-CM

## 2022-06-06 DIAGNOSIS — J84112 Idiopathic pulmonary fibrosis: Secondary | ICD-10-CM

## 2022-06-06 DIAGNOSIS — G43909 Migraine, unspecified, not intractable, without status migrainosus: Secondary | ICD-10-CM

## 2022-06-06 DIAGNOSIS — I6602 Occlusion and stenosis of left middle cerebral artery: Secondary | ICD-10-CM

## 2022-06-06 DIAGNOSIS — E782 Mixed hyperlipidemia: Secondary | ICD-10-CM

## 2022-06-06 DIAGNOSIS — Z9181 History of falling: Secondary | ICD-10-CM

## 2022-06-06 DIAGNOSIS — I69354 Hemiplegia and hemiparesis following cerebral infarction affecting left non-dominant side: Secondary | ICD-10-CM

## 2022-06-13 ENCOUNTER — Telehealth: Payer: Self-pay

## 2022-06-13 NOTE — Telephone Encounter (Signed)
PATIENT HAS BEEN SCHEDULED FOR WEDNESDAY WITH [facility], AND THE PATIENT'S WIFE WANTED TO INFORM us THAT THE PATIENT IS ON ELIQUIS AND IS WANTING TO KNOW DOES HE NEED TO STOP THE MEDICATION OR CONTINUE IT? SHE STATES THAT THE HOSPICE NURSE WAS OUT TO SEE THE PATIENT AND THEY WANTED TO MAKE SURE THAT THE WIFE CALLED TO LET us KNOW THAT HE IS STILL TAKING IT AND IF HE WOULD NEED TO STOP IT IF HE IS HAVING A PROCEDURE DONE? SHE ALSO SAID SINCE THE 06/07/22 THAT THE BOIL HAS INCREASED IN SIZE TO TODAY IT WAS 4X4 SHE STATED. PLEASE ADVISE.

## 2022-06-13 NOTE — Telephone Encounter (Addendum)
PATIENT NEEDS AN APPOINTMENT FOR I&D FOR A BOIL ON HIS BACK. WIFE STATES IT HAS BEEN THERE FOR OVER 2 WEEKS BUT NOW IT IS BIGGER AND NEEDS TO BE LANCED SHE STATES. PLEASE ADVISE WHERE TO SCHEDULE PATIENT TO HAVE THIS DONE.

## 2022-06-13 NOTE — Telephone Encounter (Signed)
error 

## 2022-06-13 NOTE — Telephone Encounter (Signed)
Strongly recommended patient go to ED or UC if he refuses ED>  I spoke with his POA.  She will discuss with him and hospice and make a decision.  She will return call tomorrow to let us know what transpired.Dr. Sedalia Muta

## 2022-06-15 ENCOUNTER — Encounter: Payer: Self-pay | Admitting: Physician Assistant

## 2022-06-15 ENCOUNTER — Ambulatory Visit (INDEPENDENT_AMBULATORY_CARE_PROVIDER_SITE_OTHER): Admitting: Physician Assistant

## 2022-06-15 VITALS — BP 130/70 | HR 61 | Temp 98.2°F | Resp 14 | Ht 65.0 in | Wt 155.0 lb

## 2022-06-15 DIAGNOSIS — L02212 Cutaneous abscess of back [any part, except buttock]: Secondary | ICD-10-CM | POA: Diagnosis not present

## 2022-06-15 MED ORDER — CEPHALEXIN 500 MG PO CAPS: 500.0000 mg | ORAL_CAPSULE | Freq: Two times a day (BID) | ORAL | 0 refills | Status: DC

## 2022-06-15 NOTE — Progress Notes (Signed)
Subjective:  Patient ID: James Richards, male    DOB: 1945/11/24  Age: 77 y.o. MRN: [id number]  Chief Complaint  Patient presents with   Follow-up   Abscess    Back    HPI   Patient was seen at ED in [facility] on 06/13/2022 for abscess on his back. Mass was 4 cm, red, tender, warm and pus discharge. He presented with abscess on the mid back. He was taking antibiotic but still drained. He denies fever and chills.   They did I/D and sent Doxycicline. Patient finished the full course of antibiotics. He is here to recheck and remove the packing. Culture showed Staphylococcus aureus.  Patient is with his cousin and she said no fever, chills, pain on the area and some discharge.     06/20/2022    2:31 PM 04/05/2022   11:33 AM 12/27/2021   11:10 AM 08/26/2021    2:14 PM 04/07/2021    3:01 PM  Depression screen PHQ 2/9  Decreased Interest 3 0 0 0 0  Down, Depressed, Hopeless 2 1 0 0 0  PHQ - 2 Score 5 1 0 0 0  Altered sleeping 2  0    Tired, decreased energy 2  0    Change in appetite 0  0    Feeling bad or failure about yourself  0  0    Trouble concentrating 2  0    Moving slowly or fidgety/restless 2  0    Suicidal thoughts 0  0    PHQ-9 Score 13  0    Difficult doing work/chores Extremely dIfficult Not difficult at all Not difficult at all          06/20/2022    2:30 PM  Fall Risk   Falls in the past year? 0  Number falls in past yr: 0  Injury with Fall? 0  Risk for fall due to : Impaired mobility;Mental status change;History of fall(s)  Follow up Falls evaluation completed    Patient Care Team: Blane Ohara, MD as PCP - General (Family Medicine)   Review of Systems  Constitutional:  Negative for chills, fatigue and fever.  HENT:  Negative for congestion, ear pain, sinus pain and sore throat.   Respiratory:  Negative for cough and shortness of breath.   Cardiovascular:  Negative for chest pain.  Genitourinary:  Negative for dysuria.  Musculoskeletal:   Negative for back pain and myalgias.  Skin:  Positive for wound (abscess).  Neurological:  Negative for headaches.    Current Outpatient Medications on File Prior to Visit  Medication Sig Dispense Refill   acetaminophen-codeine (TYLENOL #3) 300-30 MG tablet Take 1 tablet by mouth every 6 (six) hours as needed for moderate pain. TAKE 1 TABLET EVERY 4 HOURS AS NEEDED FOR MODERATE PAIN Strength: 300-30 mg 18 tablet 0   albuterol (VENTOLIN HFA) 108 (90 Base) MCG/ACT inhaler Inhale 1 puff into the lungs as needed for wheezing or shortness of breath.     atorvastatin (LIPITOR) 80 MG tablet TAKE 1 TABLET EVERY DAY (Patient taking differently: Take 80 mg by mouth daily.) 90 tablet 2   ELIQUIS 5 MG TABS tablet Take 5 mg by mouth 2 (two) times daily.     furosemide (LASIX) 40 MG tablet Take 1 tablet (40 mg total) by mouth every other day. (Patient taking differently: Take 40 mg by mouth daily as needed for fluid or edema.) 90 tablet 2   haloperidol (HALDOL) 2 MG/ML solution Place  1 mL (2 mg total) under the tongue every 4 (four) hours as needed for agitation (or delirium). 15 mL 0   levothyroxine (SYNTHROID) 125 MCG tablet Take 1 tablet (125 mcg total) by mouth daily. 90 tablet 1   LORazepam (ATIVAN) 2 MG/ML concentrated solution Place 0.5 mLs (1 mg total) under the tongue every 4 (four) hours as needed for anxiety. 30 mL 0   melatonin 3 MG TABS tablet Take 3 mg by mouth at bedtime as needed.     metoprolol tartrate (LOPRESSOR) 25 MG tablet Take 1/2 tablet (12.5 mg total) by mouth 2 (two) times daily. 30 tablet 2   Naphazoline HCl (CLEAR EYES OP) Place 1-2 drops into both eyes as needed (dry eye).     Omega-3 Fatty Acids (FISH OIL) 1200 MG CPDR Take 1 capsule by mouth daily.     pantoprazole (PROTONIX) 40 MG tablet Take 1 tablet (40 mg total) by mouth daily. 90 tablet 3   Potassium Bicarbonate 99 MG CAPS Take 99 mg by mouth daily.     rOPINIRole (REQUIP) 2 MG tablet Take 2 mg by mouth at bedtime.      SYMBICORT 160-4.5 MCG/ACT inhaler Inhale 2 puffs into the lungs daily.     zinc gluconate 50 MG tablet Take 50 mg by mouth daily.     No current facility-administered medications on file prior to visit.   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   History of recurrent TIAs 12/11/2014   Impacted cerumen 11/26/2019   Malignant neoplasm of base of tongue (HCC) 01/21/2015   Malignant neoplasm of larynx, unspecified (HCC)    Migraine 04/02/2014   Mixed hyperlipidemia    Other fatigue    Other specified hypothyroidism    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    Restless legs syndrome    Restrictive lung disease 11/26/2019   TIA (transient ischemic attack) 11/24/2014   Vision exam with abnormal findings concern for left retinal artery occlusion 03/29/2021   Past Surgical History:  Procedure Laterality Date   IR CT HEAD LTD  03/06/2022   IR PERCUTANEOUS ART THROMBECTOMY/INFUSION INTRACRANIAL INC DIAG ANGIO  03/06/2022   LOOP RECORDER INSERTION N/A 04/04/2022   Procedure: LOOP RECORDER INSERTION;  Surgeon: Lanier Prude, MD;  Location: [facility] INVASIVE CV LAB;  Service: Cardiovascular;  Laterality: N/A;   LYMPHADENECTOMY     PERIPHERAL VASCULAR CATHETERIZATION N/A 11/24/2014   Procedure: Aortic Arch Angiography;  Surgeon: Chuck Hint, MD;  [facility] INVASIVE CV LAB;  Service: Cardiovascular;  Laterality: N/A;   PERIPHERAL VASCULAR CATHETERIZATION N/A 11/24/2014   Procedure: Cerebral Angiography;  Surgeon: Chuck Hint, MD;  Location: [facility] INVASIVE CV LAB;  Service: Cardiovascular;  Laterality:  N/A;   RADIOLOGY WITH ANESTHESIA N/A 03/06/2022   Procedure: RADIOLOGY WITH ANESTHESIA;  Surgeon: Radiologist, Medication, MD;  Location: [facility] OR;  Service: Radiology;  Laterality: N/A;   THROAT SURGERY     TONSILECTOMY/ADENOIDECTOMY WITH MYRINGOTOMY      Family History  Problem Relation Age of Onset   Other Father        amputation   Deep vein thrombosis Father    Heart disease Father  Atrial Fib.   Cancer Sister    Hypertension Brother    Social History   Socioeconomic History   Marital status: Divorced    Spouse name: Not on file   Number of children: Not on file   Years of education: Not on file   Highest education level: Not on file  Occupational History   Not on file  Tobacco Use   Smoking status: Former    Packs/day: 1.00    Years: 41.00    Additional pack years: 0.00    Total pack years: 41.00    Types: Cigarettes    Quit date: 02/15/2007    Years since quitting: 15.3   Smokeless tobacco: Former    Types: Associate Professor Use: Never used  Substance and Sexual Activity   Alcohol use: No    Alcohol/week: 0.0 standard drinks of alcohol   Drug use: No   Sexual activity: Not Currently    Partners: Female  Other Topics Concern   Not on file  Social History Narrative   Not on file   Social Determinants of Health   Financial Resource Strain: Low Risk  (08/26/2021)   Overall Financial Resource Strain (CARDIA)    Difficulty of Paying Living Expenses: Not hard at all  Food Insecurity: No Food Insecurity (04/05/2022)   Hunger Vital Sign    Worried About Running Out of Food in the Last Year: Never true    Ran Out of Food in the Last Year: Never true  Transportation Needs: No Transportation Needs (04/05/2022)   PRAPARE - Administrator, Civil Service (Medical): No    Lack of Transportation (Non-Medical): No  Physical Activity: Inactive (08/26/2021)   Exercise Vital Sign    Days of Exercise per Week: 0 days    Minutes of Exercise per  Session: 0 min  Stress: No Stress Concern Present (08/26/2021)   [employer] of Occupational Health - Occupational Stress Questionnaire    Feeling of Stress : Not at all  Social Connections: Moderately Isolated (08/26/2021)   Social Connection and Isolation Panel [NHANES]    Frequency of Communication with Friends and Family: More than three times a week    Frequency of Social Gatherings with Friends and Family: More than three times a week    Attends Religious Services: More than 4 times per year    Active Member of [employer] or Organizations: No    Attends Engineer, structural: Never    Marital Status: Divorced    Objective:  BP 130/70   Pulse 61   Temp 98.2 F (36.8 C)   Resp 14   Ht 5\' 5"  (1.651 m)   Wt 155 lb (70.3 kg)   SpO2 100%   BMI 25.79 kg/m      06/29/2022   10:50 AM 06/24/2022   10:32 AM 06/20/2022    2:21 PM  BP/Weight  Systolic BP 118 124 100  Diastolic BP 70 70 66  Wt. (Lbs) 156.8 154.6 155  BMI 26.09 kg/m2 25.73 kg/m2 25.79 kg/m2    Physical Exam Skin:    General: Skin is warm.     Findings: Abscess, erythema and wound present.          Comments: Wound was still packed from ER visit with mild drainage on bandage over it. Removed the bandage and some of the packing was stuck to the bandage. Was able to express more drainage from the site. The remainder of the packing  was removed from the wound. Sterile procedure was used to remove new packing from container and was then inserted into the wound. Sterile dressing was then applied over the wound.    Diabetic Foot Exam - Simple   No data filed      Lab Results  Component Value Date   WBC 7.3 04/14/2022   HGB 13.5 04/14/2022   HCT 40.2 04/14/2022   PLT 259 04/14/2022   GLUCOSE 91 04/14/2022   CHOL 98 04/02/2022   TRIG 81 04/11/2022   HDL 46 04/02/2022   LDLCALC 43 04/02/2022   ALT 41 04/09/2022   AST 36 04/09/2022   NA 139 04/14/2022   K 3.2 (L) 04/14/2022   CL 107 04/14/2022    CREATININE 0.83 04/14/2022   BUN 8 04/14/2022   CO2 23 04/14/2022   TSH 0.774 08/24/2021   INR 1.0 04/09/2022   HGBA1C 5.7 (H) 03/06/2022      Assessment & Plan:    Abscess of back Assessment & Plan: Repacked abscess after expressing more drainage. Will follow up next week to monitor       Meds ordered this encounter  Medications   DISCONTD: cephALEXin (KEFLEX) 500 MG capsule    Sig: Take 1 capsule (500 mg total) by mouth 2 (two) times daily.    Dispense:  14 capsule    Refill:  0    No orders of the defined types were placed in this encounter.    Follow-up: Return in about 5 days (around 06/20/2022) for [place].   I,Marla I Leal-Borjas,acting as a scribe for [employer], PA.,have documented all relevant documentation on the behalf of Langley Gauss, PA,as directed by  Langley Gauss, PA while in the presence of Langley Gauss, [place].   An After Visit Summary was printed and given to the patient.  Langley Gauss, [place] [facility] [phone]

## 2022-06-16 ENCOUNTER — Other Ambulatory Visit: Payer: Self-pay

## 2022-06-16 NOTE — Patient Outreach (Signed)
Telephone outreach to patient to obtain mRS was successfully completed. MRS= 3  Quinta Eimer [facility] Care Management Assistant [phone]  

## 2022-06-20 ENCOUNTER — Encounter: Payer: Self-pay | Admitting: Physician Assistant

## 2022-06-20 ENCOUNTER — Ambulatory Visit (INDEPENDENT_AMBULATORY_CARE_PROVIDER_SITE_OTHER): Payer: Medicare PPO | Admitting: Physician Assistant

## 2022-06-20 VITALS — BP 100/66 | HR 74 | Temp 97.3°F | Ht 65.0 in | Wt 155.0 lb

## 2022-06-20 DIAGNOSIS — L02212 Cutaneous abscess of back [any part, except buttock]: Secondary | ICD-10-CM | POA: Diagnosis not present

## 2022-06-20 NOTE — Progress Notes (Signed)
Subjective:  Patient ID: James Richards, male    DOB: 1945/10/22  Age: 77 y.o. MRN: [id number]  Chief Complaint  Patient presents with   Abscess    On Back    HPI   Patient was seen at ED in [facility] on 06/13/2022 for abscess on his back.  They did I/D and has completed Doxycycline. Patient is taking Keflex. He is here to recheck and remove the packing. Culture showed Staphylococcus aureus.     06/20/2022    2:31 PM 04/05/2022   11:33 AM 12/27/2021   11:10 AM 08/26/2021    2:14 PM 04/07/2021    3:01 PM  Depression screen PHQ 2/9  Decreased Interest 3 0 0 0 0  Down, Depressed, Hopeless 2 1 0 0 0  PHQ - 2 Score 5 1 0 0 0  Altered sleeping 2  0    Tired, decreased energy 2  0    Change in appetite 0  0    Feeling bad or failure about yourself  0  0    Trouble concentrating 2  0    Moving slowly or fidgety/restless 2  0    Suicidal thoughts 0  0    PHQ-9 Score 13  0    Difficult doing work/chores Extremely dIfficult Not difficult at all Not difficult at all          06/20/2022    2:30 PM  Fall Risk   Falls in the past year? 0  Number falls in past yr: 0  Injury with Fall? 0  Risk for fall due to : Impaired mobility;Mental status change;History of fall(s)  Follow up Falls evaluation completed    Patient Care Team: Blane Ohara, MD as PCP - General (Family Medicine)   Review of Systems  Constitutional:  Negative for fatigue.  HENT:  Negative for congestion, ear pain and sore throat.   Respiratory:  Negative for cough and shortness of breath.   Cardiovascular:  Negative for chest pain.  Gastrointestinal:  Negative for abdominal pain, constipation, diarrhea, nausea and vomiting.  Genitourinary:  Negative for dysuria, frequency and urgency.  Musculoskeletal:  Negative for arthralgias, back pain and myalgias.  Neurological:  Negative for dizziness and headaches.  Psychiatric/Behavioral:  Negative for agitation and sleep disturbance. The patient is not  nervous/anxious.     Current Outpatient Medications on File Prior to Visit  Medication Sig Dispense Refill   acetaminophen-codeine (TYLENOL #3) 300-30 MG tablet Take 1 tablet by mouth every 6 (six) hours as needed for moderate pain. TAKE 1 TABLET EVERY 4 HOURS AS NEEDED FOR MODERATE PAIN Strength: 300-30 mg 18 tablet 0   albuterol (VENTOLIN HFA) 108 (90 Base) MCG/ACT inhaler Inhale 1 puff into the lungs as needed for wheezing or shortness of breath.     atorvastatin (LIPITOR) 80 MG tablet TAKE 1 TABLET EVERY DAY (Patient taking differently: Take 80 mg by mouth daily.) 90 tablet 2   ELIQUIS 5 MG TABS tablet Take 5 mg by mouth 2 (two) times daily.     furosemide (LASIX) 40 MG tablet Take 1 tablet (40 mg total) by mouth every other day. (Patient taking differently: Take 40 mg by mouth daily as needed for fluid or edema.) 90 tablet 2   haloperidol (HALDOL) 2 MG/ML solution Place 1 mL (2 mg total) under the tongue every 4 (four) hours as needed for agitation (or delirium). 15 mL 0   ipratropium-albuterol (DUONEB) 0.5-2.5 (3) MG/3ML SOLN Take 3 mLs by  nebulization every 4 (four) hours as needed.     levothyroxine (SYNTHROID) 125 MCG tablet Take 1 tablet (125 mcg total) by mouth daily. 90 tablet 1   LORazepam (ATIVAN) 2 MG/ML concentrated solution Place 0.5 mLs (1 mg total) under the tongue every 4 (four) hours as needed for anxiety. 30 mL 0   melatonin 3 MG TABS tablet Take 3 mg by mouth at bedtime as needed.     metoprolol tartrate (LOPRESSOR) 25 MG tablet Take 1/2 tablet (12.5 mg total) by mouth 2 (two) times daily. 30 tablet 2   Naphazoline HCl (CLEAR EYES OP) Place 1-2 drops into both eyes as needed (dry eye).     Omega-3 Fatty Acids (FISH OIL) 1200 MG CPDR Take 1 capsule by mouth daily.     pantoprazole (PROTONIX) 40 MG tablet Take 1 tablet (40 mg total) by mouth daily. 90 tablet 3   Potassium Bicarbonate 99 MG CAPS Take 99 mg by mouth daily.     rOPINIRole (REQUIP) 2 MG tablet Take 2 mg by mouth  at bedtime.     SYMBICORT 160-4.5 MCG/ACT inhaler Inhale 2 puffs into the lungs daily.     zinc gluconate 50 MG tablet Take 50 mg by mouth daily.     No current facility-administered medications on file prior to visit.   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   History of recurrent TIAs 12/11/2014   Impacted cerumen 11/26/2019   Malignant neoplasm of base of tongue (HCC) 01/21/2015   Malignant neoplasm of larynx, unspecified (HCC)    Migraine 04/02/2014   Mixed hyperlipidemia    Other fatigue    Other specified hypothyroidism    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    Restless legs syndrome    Restrictive lung disease 11/26/2019   TIA (transient ischemic attack) 11/24/2014   Vision exam with abnormal findings concern for left retinal artery occlusion 03/29/2021   Past Surgical History:  Procedure Laterality Date   IR CT HEAD LTD  03/06/2022   IR PERCUTANEOUS ART THROMBECTOMY/INFUSION INTRACRANIAL INC DIAG ANGIO  03/06/2022   LOOP RECORDER INSERTION N/A 04/04/2022   Procedure: LOOP RECORDER INSERTION;  Surgeon: Lanier Prude, MD;  Location: [facility] INVASIVE CV LAB;  Service: Cardiovascular;  Laterality: N/A;   LYMPHADENECTOMY     PERIPHERAL VASCULAR CATHETERIZATION N/A 11/24/2014   Procedure: Aortic Arch Angiography;  Surgeon: Chuck Hint, MD;  Location: [facility] INVASIVE CV LAB;  Service: Cardiovascular;  Laterality: N/A;   PERIPHERAL VASCULAR CATHETERIZATION N/A 11/24/2014   Procedure: Cerebral Angiography;  Surgeon: Chuck Hint, MD;  [facility] INVASIVE CV LAB;  Service: Cardiovascular;   Laterality: N/A;   RADIOLOGY WITH ANESTHESIA N/A 03/06/2022   Procedure: RADIOLOGY WITH ANESTHESIA;  Surgeon: Radiologist, Medication, MD;  Location: [facility] OR;  Service: Radiology;  Laterality: N/A;   THROAT SURGERY     TONSILECTOMY/ADENOIDECTOMY WITH MYRINGOTOMY      Family History  Problem Relation Age of Onset   Other Father        amputation   Deep vein thrombosis Father    Heart disease Father        Atrial Fib.   Cancer Sister    Hypertension  Brother    Social History   Socioeconomic History   Marital status: Divorced    Spouse name: Not on file   Number of children: Not on file   Years of education: Not on file   Highest education level: Not on file  Occupational History   Not on file  Tobacco Use   Smoking status: Former    Packs/day: 1.00    Years: 41.00    Additional pack years: 0.00    Total pack years: 41.00    Types: Cigarettes    Quit date: 02/15/2007    Years since quitting: 15.4   Smokeless tobacco: Former    Types: Associate Professor Use: Never used  Substance and Sexual Activity   Alcohol use: No    Alcohol/week: 0.0 standard drinks of alcohol   Drug use: No   Sexual activity: Not Currently    Partners: Female  Other Topics Concern   Not on file  Social History Narrative   Not on file   Social Determinants of Health   Financial Resource Strain: Low Risk  (08/26/2021)   Overall Financial Resource Strain (CARDIA)    Difficulty of Paying Living Expenses: Not hard at all  Food Insecurity: No Food Insecurity (04/05/2022)   Hunger Vital Sign    Worried About Running Out of Food in the Last Year: Never true    Ran Out of Food in the Last Year: Never true  Transportation Needs: No Transportation Needs (04/05/2022)   PRAPARE - Administrator, Civil Service (Medical): No    Lack of Transportation (Non-Medical): No  Physical Activity: Inactive (08/26/2021)   Exercise Vital Sign    Days of Exercise per Week: 0 days    Minutes of  Exercise per Session: 0 min  Stress: No Stress Concern Present (08/26/2021)   [employer] of Occupational Health - Occupational Stress Questionnaire    Feeling of Stress : Not at all  Social Connections: Moderately Isolated (08/26/2021)   Social Connection and Isolation Panel [NHANES]    Frequency of Communication with Friends and Family: More than three times a week    Frequency of Social Gatherings with Friends and Family: More than three times a week    Attends Religious Services: More than 4 times per year    Active Member of [employer] or Organizations: No    Attends Engineer, structural: Never    Marital Status: Divorced    Objective:  BP 100/66 (BP Location: Right Arm, Patient Position: Sitting, Cuff Size: Normal)   Pulse 74   Temp (!) 97.3 F (36.3 C) (Temporal)   Ht 5\' 5"  (1.651 m)   Wt 155 lb (70.3 kg)   SpO2 97%   BMI 25.79 kg/m      07/12/2022   10:16 AM 07/08/2022   10:36 AM 07/04/2022    1:45 PM  BP/Weight  Systolic BP 120 132 102  Diastolic BP 60 72 74  Wt. (Lbs) 156 159 154  BMI 25.96 kg/m2 26.46 kg/m2 25.63 kg/m2    Physical Exam Vitals reviewed.  Skin:    Comments: Indurated area of abscess. Packing present. Packing removed and repacked.   Neurological:     Mental Status: He is alert.     Diabetic Foot Exam - Simple   No data filed      Lab Results  Component Value Date   WBC 7.3 04/14/2022   HGB 13.5 04/14/2022   HCT 40.2 04/14/2022  PLT 259 04/14/2022   GLUCOSE 91 04/14/2022   CHOL 98 04/02/2022   TRIG 81 04/11/2022   HDL 46 04/02/2022   LDLCALC 43 04/02/2022   ALT 41 04/09/2022   AST 36 04/09/2022   NA 139 04/14/2022   K 3.2 (L) 04/14/2022   CL 107 04/14/2022   CREATININE 0.83 04/14/2022   BUN 8 04/14/2022   CO2 23 04/14/2022   TSH 0.774 08/24/2021   INR 1.0 04/09/2022   HGBA1C 5.7 (H) 03/06/2022      Assessment & Plan:    Abscess of back   Packing removed. Wound is still 1 cm deep. Packing replaced.   No  orders of the defined types were placed in this encounter.   No orders of the defined types were placed in this encounter.    Follow-up: Return in about 4 days (around 06/24/2022) for [place].   I,Marla I Leal-Borjas,acting as a scribe for [employer], PA.,have documented all relevant documentation on the behalf of Langley Gauss, PA,as directed by  Langley Gauss, PA while in the presence of Langley Gauss, [place].   An After Visit Summary was printed and given to the patient.  Langley Gauss, [place] [facility] [phone]

## 2022-06-20 NOTE — Assessment & Plan Note (Addendum)
Repacked abscess after expressing more drainage. Will follow up next week to monitor  Given Keflex 500 mg twice daily for 1 week.

## 2022-06-24 ENCOUNTER — Encounter: Payer: Self-pay | Admitting: Physician Assistant

## 2022-06-24 ENCOUNTER — Ambulatory Visit (INDEPENDENT_AMBULATORY_CARE_PROVIDER_SITE_OTHER): Admitting: Family Medicine

## 2022-06-24 VITALS — BP 124/70 | HR 82 | Temp 97.2°F | Resp 16 | Ht 65.0 in | Wt 154.6 lb

## 2022-06-24 DIAGNOSIS — L720 Epidermal cyst: Secondary | ICD-10-CM

## 2022-06-24 DIAGNOSIS — L02212 Cutaneous abscess of back [any part, except buttock]: Secondary | ICD-10-CM

## 2022-06-24 DIAGNOSIS — S21209D Unspecified open wound of unspecified back wall of thorax without penetration into thoracic cavity, subsequent encounter: Secondary | ICD-10-CM

## 2022-06-24 MED ORDER — DOXYCYCLINE HYCLATE 100 MG PO TABS: 100.0000 mg | ORAL_TABLET | Freq: Two times a day (BID) | ORAL | 0 refills | Status: DC

## 2022-06-24 NOTE — Patient Instructions (Signed)
Change bandage once a day. If packing falls out use sterile saline to wash inside the wound and then reapply bandage.

## 2022-06-24 NOTE — Progress Notes (Addendum)
Subjective:  Patient ID: James Richards, male    DOB: May 10, 1945  Age: 77 y.o. MRN: [id number]  Chief Complaint  Patient presents with   abscess of back     HPI  James Richards comes in for recheck of the abscess on his back.  He completed the cephalexin.  Drainage has decreased and he is feeling much better.  Abscess appears to have tracked down and to the left by about 2cm with another fluctuant mass.         06/20/2022    2:31 PM 04/05/2022   11:33 AM 12/27/2021   11:10 AM 08/26/2021    2:14 PM 04/07/2021    3:01 PM  Depression screen PHQ 2/9  Decreased Interest 3 0 0 0 0  Down, Depressed, Hopeless 2 1 0 0 0  PHQ - 2 Score 5 1 0 0 0  Altered sleeping 2  0    Tired, decreased energy 2  0    Change in appetite 0  0    Feeling bad or failure about yourself  0  0    Trouble concentrating 2  0    Moving slowly or fidgety/restless 2  0    Suicidal thoughts 0  0    PHQ-9 Score 13  0    Difficult doing work/chores Extremely dIfficult Not difficult at all Not difficult at all          06/20/2022    2:30 PM  Fall Risk   Falls in the past year? 0  Number falls in past yr: 0  Injury with Fall? 0  Risk for fall due to : Impaired mobility;Mental status change;History of fall(s)  Follow up Falls evaluation completed    Patient Care Team: Blane Ohara, MD as PCP - General (Family Medicine)   Review of Systems  Constitutional:  Negative for chills and fever.  HENT:  Negative for congestion.   Respiratory:  Negative for cough.   Cardiovascular:  Negative for chest pain.  Skin:  Positive for wound (abscess of back).    Current Outpatient Medications on File Prior to Visit  Medication Sig Dispense Refill   acetaminophen-codeine (TYLENOL #3) 300-30 MG tablet Take 1 tablet by mouth every 6 (six) hours as needed for moderate pain. TAKE 1 TABLET EVERY 4 HOURS AS NEEDED FOR MODERATE PAIN Strength: 300-30 mg 18 tablet 0   albuterol (VENTOLIN HFA) 108 (90 Base) MCG/ACT inhaler Inhale 1  puff into the lungs as needed for wheezing or shortness of breath.     atorvastatin (LIPITOR) 80 MG tablet TAKE 1 TABLET EVERY DAY (Patient taking differently: Take 80 mg by mouth daily.) 90 tablet 2   ELIQUIS 5 MG TABS tablet Take 5 mg by mouth 2 (two) times daily.     furosemide (LASIX) 40 MG tablet Take 1 tablet (40 mg total) by mouth every other day. (Patient taking differently: Take 40 mg by mouth daily as needed for fluid or edema.) 90 tablet 2   haloperidol (HALDOL) 2 MG/ML solution Place 1 mL (2 mg total) under the tongue every 4 (four) hours as needed for agitation (or delirium). 15 mL 0   ipratropium-albuterol (DUONEB) 0.5-2.5 (3) MG/3ML SOLN Take 3 mLs by nebulization every 4 (four) hours as needed.     levothyroxine (SYNTHROID) 125 MCG tablet Take 1 tablet (125 mcg total) by mouth daily. 90 tablet 1   LORazepam (ATIVAN) 2 MG/ML concentrated solution Place 0.5 mLs (1 mg total) under the tongue every  4 (four) hours as needed for anxiety. 30 mL 0   melatonin 3 MG TABS tablet Take 3 mg by mouth at bedtime as needed.     metoprolol tartrate (LOPRESSOR) 25 MG tablet Take 1/2 tablet (12.5 mg total) by mouth 2 (two) times daily. 30 tablet 2   Naphazoline HCl (CLEAR EYES OP) Place 1-2 drops into both eyes as needed (dry eye).     Omega-3 Fatty Acids (FISH OIL) 1200 MG CPDR Take 1 capsule by mouth daily.     pantoprazole (PROTONIX) 40 MG tablet Take 1 tablet (40 mg total) by mouth daily. 90 tablet 3   Potassium Bicarbonate 99 MG CAPS Take 99 mg by mouth daily.     rOPINIRole (REQUIP) 2 MG tablet Take 2 mg by mouth at bedtime.     SYMBICORT 160-4.5 MCG/ACT inhaler Inhale 2 puffs into the lungs daily.     zinc gluconate 50 MG tablet Take 50 mg by mouth daily.     No current facility-administered medications on file prior to visit.   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   History of recurrent TIAs 12/11/2014   Impacted cerumen 11/26/2019   Malignant neoplasm of base of tongue (HCC) 01/21/2015   Malignant neoplasm of larynx, unspecified (HCC)    Migraine 04/02/2014   Mixed hyperlipidemia    Other fatigue    Other specified hypothyroidism    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    Restless legs syndrome    Restrictive lung disease 11/26/2019   TIA (transient ischemic attack) 11/24/2014   Vision exam with abnormal findings concern for left retinal artery occlusion 03/29/2021   Past Surgical History:  Procedure Laterality Date   IR CT HEAD LTD  03/06/2022   IR PERCUTANEOUS ART THROMBECTOMY/INFUSION INTRACRANIAL INC DIAG ANGIO  03/06/2022   LOOP RECORDER INSERTION N/A 04/04/2022   Procedure: LOOP RECORDER INSERTION;  Surgeon: Lanier Prude, MD;  Location: [facility] INVASIVE CV LAB;  Service: Cardiovascular;  Laterality: N/A;   LYMPHADENECTOMY     PERIPHERAL VASCULAR CATHETERIZATION N/A 11/24/2014   Procedure: Aortic Arch Angiography;  Surgeon: Chuck Hint, MD;  Location: [facility] INVASIVE CV LAB;  Service: Cardiovascular;  Laterality: N/A;   PERIPHERAL VASCULAR CATHETERIZATION N/A 11/24/2014   Procedure: Cerebral Angiography;  Surgeon: Chuck Hint, MD;  Location: [facility] INVASIVE CV LAB;  Service: Cardiovascular;  Laterality: N/A;   RADIOLOGY WITH ANESTHESIA N/A 03/06/2022   Procedure: RADIOLOGY WITH ANESTHESIA;  Surgeon: Radiologist, Medication, MD;  Location: [facility] OR;  Service: Radiology;  Laterality: N/A;   THROAT SURGERY     TONSILECTOMY/ADENOIDECTOMY WITH MYRINGOTOMY      Family History  Problem Relation Age of Onset   Other Father        amputation   Deep vein thrombosis Father     Heart disease Father        Atrial Fib.   Cancer Sister    Hypertension Brother    Social History   Socioeconomic History   Marital status: Divorced    Spouse name: Not on file   Number of children: Not on file   Years of education: Not on file   Highest education level: Not on  file  Occupational History   Not on file  Tobacco Use   Smoking status: Former    Packs/day: 1.00    Years: 41.00    Additional pack years: 0.00    Total pack years: 41.00    Types: Cigarettes    Quit date: 02/15/2007    Years since quitting: 15.4   Smokeless tobacco: Former    Types: Associate Professor Use: Never used  Substance and Sexual Activity   Alcohol use: No    Alcohol/week: 0.0 standard drinks of alcohol   Drug use: No   Sexual activity: Not Currently    Partners: Female  Other Topics Concern   Not on file  Social History Narrative   Not on file   Social Determinants of Health   Financial Resource Strain: Low Risk  (08/26/2021)   Overall Financial Resource Strain (CARDIA)    Difficulty of Paying Living Expenses: Not hard at all  Food Insecurity: No Food Insecurity (04/05/2022)   Hunger Vital Sign    Worried About Running Out of Food in the Last Year: Never true    Ran Out of Food in the Last Year: Never true  Transportation Needs: No Transportation Needs (04/05/2022)   PRAPARE - Administrator, Civil Service (Medical): No    Lack of Transportation (Non-Medical): No  Physical Activity: Inactive (08/26/2021)   Exercise Vital Sign    Days of Exercise per Week: 0 days    Minutes of Exercise per Session: 0 min  Stress: No Stress Concern Present (08/26/2021)   [employer] of Occupational Health - Occupational Stress Questionnaire    Feeling of Stress : Not at all  Social Connections: Moderately Isolated (08/26/2021)   Social Connection and Isolation Panel [NHANES]    Frequency of Communication with Friends and Family: More than three times a week    Frequency of  Social Gatherings with Friends and Family: More than three times a week    Attends Religious Services: More than 4 times per year    Active Member of [employer] or Organizations: No    Attends Engineer, structural: Never    Marital Status: Divorced    Objective:  BP 124/70   Pulse 82   Temp (!) 97.2 F (36.2 C)   Resp 16   Ht 5\' 5"  (1.651 m)   Wt 154 lb 9.6 oz (70.1 kg)   BMI 25.73 kg/m      07/12/2022   10:16 AM 07/08/2022   10:36 AM 07/04/2022    1:45 PM  BP/Weight  Systolic BP 120 132 102  Diastolic BP 60 72 74  Wt. (Lbs) 156 159 154  BMI 25.96 kg/m2 26.46 kg/m2 25.63 kg/m2    Physical Exam Vitals reviewed.  Skin:    Comments: Wound mid upper back erythema around wound. Packing removed. Closing by secondary intention.  2nd abscess 2 inches lower and to the left of initial lesion. This lesion is not erythematous,but has some fluctuance and is tender.     Neurological:     Mental Status: He is alert.     Diabetic Foot Exam - Simple   No data filed      Lab Results  Component Value Date   WBC 7.3 04/14/2022   HGB 13.5 04/14/2022   HCT 40.2 04/14/2022   PLT 259 04/14/2022   GLUCOSE 91 04/14/2022   CHOL 98 04/02/2022   TRIG 81 04/11/2022   HDL 46 04/02/2022  LDLCALC 43 04/02/2022   ALT 41 04/09/2022   AST 36 04/09/2022   NA 139 04/14/2022   K 3.2 (L) 04/14/2022   CL 107 04/14/2022   CREATININE 0.83 04/14/2022   BUN 8 04/14/2022   CO2 23 04/14/2022   TSH 0.774 08/24/2021   INR 1.0 04/09/2022   HGBA1C 5.7 (H) 03/06/2022      Assessment & Plan:   Open wound of back, unspecified laterality, subsequent encounter Assessment & Plan: 06/24/2022: Repacked wound and will follow up next week to monitor  Started on doxycycline.    EIC (epidermal inclusion cyst) Assessment & Plan: Started on doxycycline.  Consider I and D next week if not improved.    ERROR: I AND D BELOW WAS NOT DONE TODAY. UNABLE TO REMOVE FROM NOTE.  DR. Sedalia Muta   I &  D  Date/Time: 07/17/2022 5:37 PM  Performed by: Blane Ohara, MD Authorized by: Blane Ohara, MD   Consent:    Consent obtained:  Verbal   Consent given by:  Patient   Risks, benefits, and alternatives were discussed: yes     Risks discussed:  Bleeding and infection Universal protocol:    Patient identity confirmed:  Provided demographic data Location:    Type:  Abscess   Size:  3 cm   Location: upper left back. Pre-procedure details:    Skin preparation:  Alcohol Sedation:    Sedation type:  None Anesthesia:    Anesthesia method:  Local infiltration   Local anesthetic:  Lidocaine 1% WITH epi Procedure type:    Complexity:  Simple Procedure details:    Needle aspiration: no     Incision types:  Single straight   Incision depth:  Subcutaneous   Scalpel blade:  10   Wound management:  Probed and deloculated   Drainage:  Serous   Drainage amount:  Moderate   Wound treatment:  Wound left open   Packing materials:  1/4 in iodoform gauze   Amount 1/4" iodoform:  6 inch Post-procedure details:    Procedure completion:  Tolerated well, no immediate complications Comments:     EIC that was mildly abscessed.    Meds ordered this encounter  Medications   DISCONTD: doxycycline (VIBRA-TABS) 100 MG tablet    Sig: Take 1 tablet (100 mg total) by mouth 2 (two) times daily.    Dispense:  14 tablet    Refill:  0    No orders of the defined types were placed in this encounter.    Follow-up: Return in about 4 days (around 06/28/2022) for [place].   I,Jacqua L Marsh,acting as a scribe for Blane Ohara, MD.,have documented all relevant documentation on the behalf of Blane Ohara, MD,as directed by  Blane Ohara, MD while in the presence of Blane Ohara, MD.   An After Visit Summary was printed and given to the patient.  Blane Ohara, MD [facility] [phone]

## 2022-06-28 NOTE — Progress Notes (Signed)
Subjective:  Patient ID: RICHRD DA, male    DOB: 07-06-1945  Age: 77 y.o. MRN: [id number]  Chief Complaint  Patient presents with   abscess of back    HPI   Mr. Hladky comes in for recheck of the abscess on his back.  He is still taking the Doxycycline. The second abscess has not decreased since starting the medication. Discussed with the patient about draining it here in the office. Patient agreed and the procedure was performed here in office.      06/20/2022    2:31 PM 04/05/2022   11:33 AM 12/27/2021   11:10 AM 08/26/2021    2:14 PM 04/07/2021    3:01 PM  Depression screen PHQ 2/9  Decreased Interest 3 0 0 0 0  Down, Depressed, Hopeless 2 1 0 0 0  PHQ - 2 Score 5 1 0 0 0  Altered sleeping 2  0    Tired, decreased energy 2  0    Change in appetite 0  0    Feeling bad or failure about yourself  0  0    Trouble concentrating 2  0    Moving slowly or fidgety/restless 2  0    Suicidal thoughts 0  0    PHQ-9 Score 13  0    Difficult doing work/chores Extremely dIfficult Not difficult at all Not difficult at all          06/20/2022    2:30 PM  Fall Risk   Falls in the past year? 0  Number falls in past yr: 0  Injury with Fall? 0  Risk for fall due to : Impaired mobility;Mental status change;History of fall(s)  Follow up Falls evaluation completed    Patient Care Team: Blane Ohara, MD as PCP - General (Family Medicine)   Review of Systems  Constitutional:  Negative for fatigue.  HENT:  Negative for congestion, ear pain and sore throat.   Respiratory:  Negative for cough and shortness of breath.   Cardiovascular:  Negative for chest pain.  Gastrointestinal:  Negative for abdominal pain, constipation, diarrhea, nausea and vomiting.  Genitourinary:  Negative for dysuria, frequency and urgency.  Musculoskeletal:  Negative for arthralgias, back pain and myalgias.  Neurological:  Negative for dizziness and headaches.  Psychiatric/Behavioral:  Negative for agitation and  sleep disturbance. The patient is not nervous/anxious.     Current Outpatient Medications on File Prior to Visit  Medication Sig Dispense Refill   acetaminophen-codeine (TYLENOL #3) 300-30 MG tablet Take 1 tablet by mouth every 6 (six) hours as needed for moderate pain. TAKE 1 TABLET EVERY 4 HOURS AS NEEDED FOR MODERATE PAIN Strength: 300-30 mg 18 tablet 0   albuterol (VENTOLIN HFA) 108 (90 Base) MCG/ACT inhaler Inhale 1 puff into the lungs as needed for wheezing or shortness of breath.     atorvastatin (LIPITOR) 80 MG tablet TAKE 1 TABLET EVERY DAY (Patient taking differently: Take 80 mg by mouth daily.) 90 tablet 2   ELIQUIS 5 MG TABS tablet Take 5 mg by mouth 2 (two) times daily.     furosemide (LASIX) 40 MG tablet Take 1 tablet (40 mg total) by mouth every other day. (Patient taking differently: Take 40 mg by mouth daily as needed for fluid or edema.) 90 tablet 2   haloperidol (HALDOL) 2 MG/ML solution Place 1 mL (2 mg total) under the tongue every 4 (four) hours as needed for agitation (or delirium). 15 mL 0   hyoscyamine (LEVSIN  SL) 0.125 MG SL tablet Place under the tongue.     ipratropium-albuterol (DUONEB) 0.5-2.5 (3) MG/3ML SOLN Take 3 mLs by nebulization every 4 (four) hours as needed.     levothyroxine (SYNTHROID) 125 MCG tablet Take 1 tablet (125 mcg total) by mouth daily. 90 tablet 1   LORazepam (ATIVAN) 2 MG/ML concentrated solution Place 0.5 mLs (1 mg total) under the tongue every 4 (four) hours as needed for anxiety. 30 mL 0   melatonin 3 MG TABS tablet Take 3 mg by mouth at bedtime as needed.     metoprolol tartrate (LOPRESSOR) 25 MG tablet Take 1/2 tablet (12.5 mg total) by mouth 2 (two) times daily. 30 tablet 2   Naphazoline HCl (CLEAR EYES OP) Place 1-2 drops into both eyes as needed (dry eye).     Omega-3 Fatty Acids (FISH OIL) 1200 MG CPDR Take 1 capsule by mouth daily.     pantoprazole (PROTONIX) 40 MG tablet Take 1 tablet (40 mg total) by mouth daily. 90 tablet 3    Potassium Bicarbonate 99 MG CAPS Take 99 mg by mouth daily.     rOPINIRole (REQUIP) 2 MG tablet Take 2 mg by mouth at bedtime.     SYMBICORT 160-4.5 MCG/ACT inhaler Inhale 2 puffs into the lungs daily.     zinc gluconate 50 MG tablet Take 50 mg by mouth daily.     No current facility-administered medications on file prior to visit.   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   History of recurrent TIAs 12/11/2014   Impacted cerumen 11/26/2019   Malignant neoplasm of base of tongue (HCC) 01/21/2015   Malignant neoplasm of larynx, unspecified (HCC)    Migraine 04/02/2014   Mixed hyperlipidemia    Other fatigue    Other specified hypothyroidism    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    Restless legs syndrome    Restrictive lung disease 11/26/2019   TIA (transient ischemic attack) 11/24/2014   Vision exam with abnormal findings concern for left retinal artery occlusion 03/29/2021   Past Surgical History:  Procedure Laterality Date   IR CT HEAD LTD  03/06/2022   IR PERCUTANEOUS ART THROMBECTOMY/INFUSION INTRACRANIAL INC DIAG ANGIO  03/06/2022   LOOP RECORDER INSERTION N/A 04/04/2022   Procedure: LOOP RECORDER INSERTION;  Surgeon: Lanier Prude, MD;  Location: [facility] INVASIVE CV LAB;  Service: Cardiovascular;  Laterality: N/A;   LYMPHADENECTOMY     PERIPHERAL VASCULAR CATHETERIZATION N/A 11/24/2014   Procedure: Aortic Arch Angiography;  Surgeon: Chuck Hint, MD;  Location: [facility] INVASIVE CV LAB;  Service: Cardiovascular;  Laterality: N/A;   PERIPHERAL VASCULAR CATHETERIZATION N/A 11/24/2014   Procedure:  Cerebral Angiography;  Surgeon: Chuck Hint, MD;  Location: [facility] INVASIVE CV LAB;  Service: Cardiovascular;  Laterality: N/A;   RADIOLOGY WITH ANESTHESIA N/A 03/06/2022   Procedure: RADIOLOGY WITH ANESTHESIA;  Surgeon: Radiologist, Medication, MD;  Location: [facility] OR;  Service: Radiology;  Laterality: N/A;   THROAT SURGERY     TONSILECTOMY/ADENOIDECTOMY WITH MYRINGOTOMY      Family History  Problem Relation Age of Onset   Other Father        amputation   Deep vein thrombosis Father  Heart disease Father        Atrial Fib.   Cancer Sister    Hypertension Brother    Social History   Socioeconomic History   Marital status: Divorced    Spouse name: Not on file   Number of children: Not on file   Years of education: Not on file   Highest education level: Not on file  Occupational History   Not on file  Tobacco Use   Smoking status: Former    Packs/day: 1.00    Years: 41.00    Additional pack years: 0.00    Total pack years: 41.00    Types: Cigarettes    Quit date: 02/15/2007    Years since quitting: 15.4   Smokeless tobacco: Former    Types: Associate Professor Use: Never used  Substance and Sexual Activity   Alcohol use: No    Alcohol/week: 0.0 standard drinks of alcohol   Drug use: No   Sexual activity: Not Currently    Partners: Female  Other Topics Concern   Not on file  Social History Narrative   Not on file   Social Determinants of Health   Financial Resource Strain: Low Risk  (08/26/2021)   Overall Financial Resource Strain (CARDIA)    Difficulty of Paying Living Expenses: Not hard at all  Food Insecurity: No Food Insecurity (04/05/2022)   Hunger Vital Sign    Worried About Running Out of Food in the Last Year: Never true    Ran Out of Food in the Last Year: Never true  Transportation Needs: No Transportation Needs (04/05/2022)   PRAPARE - Administrator, Civil Service (Medical): No    Lack of Transportation (Non-Medical): No   Physical Activity: Inactive (08/26/2021)   Exercise Vital Sign    Days of Exercise per Week: 0 days    Minutes of Exercise per Session: 0 min  Stress: No Stress Concern Present (08/26/2021)   [employer] of Occupational Health - Occupational Stress Questionnaire    Feeling of Stress : Not at all  Social Connections: Moderately Isolated (08/26/2021)   Social Connection and Isolation Panel [NHANES]    Frequency of Communication with Friends and Family: More than three times a week    Frequency of Social Gatherings with Friends and Family: More than three times a week    Attends Religious Services: More than 4 times per year    Active Member of [employer] or Organizations: No    Attends Engineer, structural: Never    Marital Status: Divorced    Objective:  BP 118/70 (BP Location: Right Arm, Patient Position: Sitting, Cuff Size: Normal)   Pulse (!) 58   Temp (!) 97 F (36.1 C) (Temporal)   Ht 5\' 5"  (1.651 m)   Wt 156 lb 12.8 oz (71.1 kg)   SpO2 98%   BMI 26.09 kg/m      07/19/2022    1:25 PM 07/12/2022   10:16 AM 07/08/2022   10:36 AM  BP/Weight  Systolic BP 140 120 132  Diastolic BP 70 60 72  Wt. (Lbs) 156 156 159  BMI 25.96 kg/m2 25.96 kg/m2 26.46 kg/m2    Physical Exam Skin:    Findings: Abscess present.          Comments: 1. I AND D PERFORMED IN ED AND HAS BEEN RETURNING WEEKLY FOR PACKING. 2.  NEW ABSCESSED EIC I AND D PERFORMED IN OFFICE.     Diabetic Foot  Exam - Simple   No data filed      Lab Results  Component Value Date   WBC 7.3 04/14/2022   HGB 13.5 04/14/2022   HCT 40.2 04/14/2022   PLT 259 04/14/2022   GLUCOSE 91 04/14/2022   CHOL 98 04/02/2022   TRIG 81 04/11/2022   HDL 46 04/02/2022   LDLCALC 43 04/02/2022   ALT 41 04/09/2022   AST 36 04/09/2022   NA 139 04/14/2022   K 3.2 (L) 04/14/2022   CL 107 04/14/2022   CREATININE 0.83 04/14/2022   BUN 8 04/14/2022   CO2 23 04/14/2022   TSH 0.774 08/24/2021   INR 1.0 04/09/2022    HGBA1C 5.7 (H) 03/06/2022      Assessment & Plan:    Wound of back, unspecified laterality, subsequent encounter Assessment & Plan: Continue to monitor abscess drained at ER. Continues to look well and healing well with no signs of reinfection.    Wound of left side of back, subsequent encounter Assessment & Plan: Location: posterior back 1 inch below and towards left of initial abscess.  Procedure: Incision & drainage  Informed consent:  Discussed risks (recurrence of the condition, bleeding, infection) and benefits of the procedure, as well as the alternatives.  Informed consent was obtained.  Anesthesia: lidocaine with epinephrine used to perform a plane block.  Type: I and D.   The area was prepared and draped in a standard fashion.  Attempted to remove entire cyst, but was unsuccessful. Expressed cottage cheese like substance with foul odor.  Pieces of cyst wall were extracted.  New lesion packed. Previous I and D site is decreased in size and has no drainage. Was not repaacked.  The patient tolerated the procedure well.  The patient was instructed on post-op care.       No orders of the defined types were placed in this encounter.   No orders of the defined types were placed in this encounter.    Follow-up: Return in about 1 week (around 07/06/2022).   I,Jacqua L Marsh,acting as a scribe for [employer], PA.,have documented all relevant documentation on the behalf of Langley Gauss, PA,as directed by  Langley Gauss, PA while in the presence of Langley Gauss, [place].   An After Visit Summary was printed and given to the patient.  Langley Gauss, [place] [facility] [phone]

## 2022-06-29 ENCOUNTER — Ambulatory Visit (INDEPENDENT_AMBULATORY_CARE_PROVIDER_SITE_OTHER): Admitting: Physician Assistant

## 2022-06-29 VITALS — BP 118/70 | HR 58 | Temp 97.0°F | Ht 65.0 in | Wt 156.8 lb

## 2022-06-29 DIAGNOSIS — S21209D Unspecified open wound of unspecified back wall of thorax without penetration into thoracic cavity, subsequent encounter: Secondary | ICD-10-CM | POA: Diagnosis not present

## 2022-06-29 DIAGNOSIS — S21202D Unspecified open wound of left back wall of thorax without penetration into thoracic cavity, subsequent encounter: Secondary | ICD-10-CM

## 2022-06-29 DIAGNOSIS — L720 Epidermal cyst: Secondary | ICD-10-CM

## 2022-06-29 DIAGNOSIS — L02212 Cutaneous abscess of back [any part, except buttock]: Secondary | ICD-10-CM

## 2022-07-04 ENCOUNTER — Ambulatory Visit (INDEPENDENT_AMBULATORY_CARE_PROVIDER_SITE_OTHER): Payer: Medicare PPO | Admitting: Physician Assistant

## 2022-07-04 VITALS — BP 102/74 | HR 57 | Temp 97.0°F | Ht 65.0 in | Wt 154.0 lb

## 2022-07-04 DIAGNOSIS — L02212 Cutaneous abscess of back [any part, except buttock]: Secondary | ICD-10-CM

## 2022-07-04 DIAGNOSIS — L720 Epidermal cyst: Secondary | ICD-10-CM

## 2022-07-04 NOTE — Progress Notes (Signed)
Subjective:  Patient ID: James Richards, male    DOB: Dec 28, 1945  Age: 77 y.o. MRN: [id number]  Chief Complaint  Patient presents with   Abscess of back    HPI  James Richards 76 year old male is office today for recheck of the abscess on his back. Patient report he is doing fine, and has no pain today, Doxycycline is finished.      06/20/2022    2:31 PM 04/05/2022   11:33 AM 12/27/2021   11:10 AM 08/26/2021    2:14 PM 04/07/2021    3:01 PM  Depression screen PHQ 2/9  Decreased Interest 3 0 0 0 0  Down, Depressed, Hopeless 2 1 0 0 0  PHQ - 2 Score 5 1 0 0 0  Altered sleeping 2  0    Tired, decreased energy 2  0    Change in appetite 0  0    Feeling bad or failure about yourself  0  0    Trouble concentrating 2  0    Moving slowly or fidgety/restless 2  0    Suicidal thoughts 0  0    PHQ-9 Score 13  0    Difficult doing work/chores Extremely dIfficult Not difficult at all Not difficult at all          06/20/2022    2:30 PM  Fall Risk   Falls in the past year? 0  Number falls in past yr: 0  Injury with Fall? 0  Risk for fall due to : Impaired mobility;Mental status change;History of fall(s)  Follow up Falls evaluation completed    Patient Care Team: Blane Ohara, MD as PCP - General (Family Medicine)   Review of Systems  Constitutional:  Negative for fatigue.  HENT:  Negative for congestion, ear pain and sore throat.   Respiratory:  Negative for cough and shortness of breath.   Cardiovascular:  Negative for chest pain.  Gastrointestinal:  Negative for abdominal pain, constipation, diarrhea, nausea and vomiting.  Genitourinary:  Negative for dysuria, frequency and urgency.  Musculoskeletal:  Negative for arthralgias, back pain and myalgias.  Neurological:  Negative for dizziness and headaches.  Psychiatric/Behavioral:  Negative for agitation and sleep disturbance. The patient is not nervous/anxious.     Current Outpatient Medications on File Prior to Visit   Medication Sig Dispense Refill   acetaminophen-codeine (TYLENOL #3) 300-30 MG tablet Take 1 tablet by mouth every 6 (six) hours as needed for moderate pain. TAKE 1 TABLET EVERY 4 HOURS AS NEEDED FOR MODERATE PAIN Strength: 300-30 mg 18 tablet 0   albuterol (VENTOLIN HFA) 108 (90 Base) MCG/ACT inhaler Inhale 1 puff into the lungs as needed for wheezing or shortness of breath.     atorvastatin (LIPITOR) 80 MG tablet TAKE 1 TABLET EVERY DAY (Patient taking differently: Take 80 mg by mouth daily.) 90 tablet 2   ELIQUIS 5 MG TABS tablet Take 5 mg by mouth 2 (two) times daily.     furosemide (LASIX) 40 MG tablet Take 1 tablet (40 mg total) by mouth every other day. (Patient taking differently: Take 40 mg by mouth daily as needed for fluid or edema.) 90 tablet 2   haloperidol (HALDOL) 2 MG/ML solution Place 1 mL (2 mg total) under the tongue every 4 (four) hours as needed for agitation (or delirium). 15 mL 0   hyoscyamine (LEVSIN SL) 0.125 MG SL tablet Place under the tongue.     ipratropium-albuterol (DUONEB) 0.5-2.5 (3) MG/3ML SOLN  Take 3 mLs by nebulization every 4 (four) hours as needed.     levothyroxine (SYNTHROID) 125 MCG tablet Take 1 tablet (125 mcg total) by mouth daily. 90 tablet 1   LORazepam (ATIVAN) 2 MG/ML concentrated solution Place 0.5 mLs (1 mg total) under the tongue every 4 (four) hours as needed for anxiety. 30 mL 0   melatonin 3 MG TABS tablet Take 3 mg by mouth at bedtime as needed.     metoprolol tartrate (LOPRESSOR) 25 MG tablet Take 1/2 tablet (12.5 mg total) by mouth 2 (two) times daily. 30 tablet 2   Naphazoline HCl (CLEAR EYES OP) Place 1-2 drops into both eyes as needed (dry eye).     Omega-3 Fatty Acids (FISH OIL) 1200 MG CPDR Take 1 capsule by mouth daily.     pantoprazole (PROTONIX) 40 MG tablet Take 1 tablet (40 mg total) by mouth daily. 90 tablet 3   Potassium Bicarbonate 99 MG CAPS Take 99 mg by mouth daily.     rOPINIRole (REQUIP) 2 MG tablet Take 2 mg by mouth at  bedtime.     SYMBICORT 160-4.5 MCG/ACT inhaler Inhale 2 puffs into the lungs daily.     zinc gluconate 50 MG tablet Take 50 mg by mouth daily.     No current facility-administered medications on file prior to visit.   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   History of recurrent TIAs 12/11/2014   Impacted cerumen 11/26/2019   Malignant neoplasm of base of tongue (HCC) 01/21/2015   Malignant neoplasm of larynx, unspecified (HCC)    Migraine 04/02/2014   Mixed hyperlipidemia    Other fatigue    Other specified hypothyroidism    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    Restless legs syndrome    Restrictive lung disease 11/26/2019   TIA (transient ischemic attack) 11/24/2014   Vision exam with abnormal findings concern for left retinal artery occlusion 03/29/2021   Past Surgical History:  Procedure Laterality Date   IR CT HEAD LTD  03/06/2022   IR PERCUTANEOUS ART THROMBECTOMY/INFUSION INTRACRANIAL INC DIAG ANGIO  03/06/2022   LOOP RECORDER INSERTION N/A 04/04/2022   Procedure: LOOP RECORDER INSERTION;  Surgeon: Lanier Prude, MD;  Location: [facility] INVASIVE CV LAB;  Service: Cardiovascular;  Laterality: N/A;   LYMPHADENECTOMY     PERIPHERAL VASCULAR CATHETERIZATION N/A 11/24/2014   Procedure: Aortic Arch Angiography;  Surgeon: Chuck Hint, MD;  Location: [facility] INVASIVE CV LAB;  Service: Cardiovascular;  Laterality: N/A;   PERIPHERAL VASCULAR CATHETERIZATION N/A 11/24/2014   Procedure: Cerebral Angiography;  Surgeon: Chuck Hint, MD;  Location: [facility] INVASIVE CV LAB;  Service: Cardiovascular;   Laterality: N/A;   RADIOLOGY WITH ANESTHESIA N/A 03/06/2022   Procedure: RADIOLOGY WITH ANESTHESIA;  Surgeon: Radiologist, Medication, MD;  Location: [facility] OR;  Service: Radiology;  Laterality: N/A;   THROAT SURGERY     TONSILECTOMY/ADENOIDECTOMY WITH MYRINGOTOMY      Family History  Problem Relation Age of Onset   Other Father        amputation   Deep vein thrombosis Father    Heart disease Father        Atrial Fib.   Cancer Sister  Hypertension Brother    Social History   Socioeconomic History   Marital status: Divorced    Spouse name: Not on file   Number of children: Not on file   Years of education: Not on file   Highest education level: Not on file  Occupational History   Not on file  Tobacco Use   Smoking status: Former    Packs/day: 1.00    Years: 41.00    Additional pack years: 0.00    Total pack years: 41.00    Types: Cigarettes    Quit date: 02/15/2007    Years since quitting: 15.4   Smokeless tobacco: Former    Types: Associate Professor Use: Never used  Substance and Sexual Activity   Alcohol use: No    Alcohol/week: 0.0 standard drinks of alcohol   Drug use: No   Sexual activity: Not Currently    Partners: Female  Other Topics Concern   Not on file  Social History Narrative   Not on file   Social Determinants of Health   Financial Resource Strain: Low Risk  (08/26/2021)   Overall Financial Resource Strain (CARDIA)    Difficulty of Paying Living Expenses: Not hard at all  Food Insecurity: No Food Insecurity (04/05/2022)   Hunger Vital Sign    Worried About Running Out of Food in the Last Year: Never true    Ran Out of Food in the Last Year: Never true  Transportation Needs: No Transportation Needs (04/05/2022)   PRAPARE - Administrator, Civil Service (Medical): No    Lack of Transportation (Non-Medical): No  Physical Activity: Inactive (08/26/2021)   Exercise Vital Sign    Days of Exercise per Week: 0 days    Minutes of Exercise  per Session: 0 min  Stress: No Stress Concern Present (08/26/2021)   [employer] of Occupational Health - Occupational Stress Questionnaire    Feeling of Stress : Not at all  Social Connections: Moderately Isolated (08/26/2021)   Social Connection and Isolation Panel [NHANES]    Frequency of Communication with Friends and Family: More than three times a week    Frequency of Social Gatherings with Friends and Family: More than three times a week    Attends Religious Services: More than 4 times per year    Active Member of [employer] or Organizations: No    Attends Banker Meetings: Never    Marital Status: Divorced    Objective:  BP 102/74 (BP Location: Left Arm, Patient Position: Sitting, Cuff Size: Normal)   Pulse (!) 57   Temp (!) 97 F (36.1 C) (Temporal)   Ht 5\' 5"  (1.651 m)   Wt 154 lb (69.9 kg)   SpO2 99%   BMI 25.63 kg/m      07/19/2022    1:25 PM 07/12/2022   10:16 AM 07/08/2022   10:36 AM  BP/Weight  Systolic BP 140 120 132  Diastolic BP 70 60 72  Wt. (Lbs) 156 156 159  BMI 25.96 kg/m2 25.96 kg/m2 26.46 kg/m2    Physical Exam Skin:    Findings: Abscess present.          Comments: 1. I AND D PERFORMED IN ED AND HAS BEEN RETURNING WEEKLY FOR PACKING. 2.  NEW ABSCESSED EIC I AND D PERFORMED IN OFFICE. WOUND HEALING WELL, RETURNING WEEKLY FOR PACKING     Diabetic Foot Exam - Simple   No data filed      Lab  Results  Component Value Date   WBC 7.3 04/14/2022   HGB 13.5 04/14/2022   HCT 40.2 04/14/2022   PLT 259 04/14/2022   GLUCOSE 91 04/14/2022   CHOL 98 04/02/2022   TRIG 81 04/11/2022   HDL 46 04/02/2022   LDLCALC 43 04/02/2022   ALT 41 04/09/2022   AST 36 04/09/2022   NA 139 04/14/2022   K 3.2 (L) 04/14/2022   CL 107 04/14/2022   CREATININE 0.83 04/14/2022   BUN 8 04/14/2022   CO2 23 04/14/2022   TSH 0.774 08/24/2021   INR 1.0 04/09/2022   HGBA1C 5.7 (H) 03/06/2022      Assessment & Plan:    Abscess of back Assessment &  Plan: Wound continues to heal well.  Patient will continue to change bandage at home one to two times daily.  Patient will return only if it shows signs of reinfection.   Epidermal inclusion cyst Assessment & Plan: Wound continues to heal well.  Patient will continue to change bandage at home one to two times daily.  Patient will return only if it shows signs of reinfection.      No orders of the defined types were placed in this encounter.   No orders of the defined types were placed in this encounter.    Follow-up: No follow-ups on file.   I,Jacqua L Marsh,acting as a scribe for [employer], PA.,have documented all relevant documentation on the behalf of Langley Gauss, PA,as directed by  Langley Gauss, PA while in the presence of Langley Gauss, [place].   An After Visit Summary was printed and given to the patient.  Langley Gauss, [place] [facility] [phone]

## 2022-07-07 ENCOUNTER — Encounter: Payer: Medicare PPO | Admitting: Family Medicine

## 2022-07-07 NOTE — Progress Notes (Signed)
Not seen. Dr. Sedalia Muta

## 2022-07-08 ENCOUNTER — Ambulatory Visit (INDEPENDENT_AMBULATORY_CARE_PROVIDER_SITE_OTHER): Payer: Medicare PPO | Admitting: Physician Assistant

## 2022-07-08 VITALS — BP 132/72 | HR 54 | Temp 97.1°F | Ht 65.0 in | Wt 159.0 lb

## 2022-07-08 DIAGNOSIS — L720 Epidermal cyst: Secondary | ICD-10-CM | POA: Insufficient documentation

## 2022-07-08 DIAGNOSIS — L02212 Cutaneous abscess of back [any part, except buttock]: Secondary | ICD-10-CM

## 2022-07-08 NOTE — Assessment & Plan Note (Signed)
Continue to monitor abscess drained at ER. Continues to look well and healing well with no signs of reinfection.

## 2022-07-08 NOTE — Assessment & Plan Note (Signed)
Location: posterior back 1 inch below and towards left of initial abscess.  Procedure: Incision & drainage  Informed consent:  Discussed risks (recurrence of the condition, bleeding, infection) and benefits of the procedure, as well as the alternatives.  Informed consent was obtained.  Anesthesia: lidocaine with epinephrine used to perform a plane block.  Type: I and D.   The area was prepared and draped in a standard fashion.  Attempted to remove entire cyst, but was unsuccessful. Expressed cottage cheese like substance with foul odor.  Pieces of cyst wall were extracted.  New lesion packed. Previous I and D site is decreased in size and has no drainage. Was not repaacked.  The patient tolerated the procedure well.  The patient was instructed on post-op care.

## 2022-07-08 NOTE — Progress Notes (Signed)
Subjective:  Patient ID: James Richards, male    DOB: [birth year]  Age: 77 y.o. MRN: [id number]  Chief Complaint  Patient presents with   Abscess re-evaluation    HPI   James Richards who is accompanied by James Richards (Cousin-Caregiver) comes in for recheck of the abscess on his back that was drained and excised in our office and packed with packing. Has been cleaning area with saline or dial soap and changing dressing TWICE DAILY. Reports mild drainage but odorless. Removed current packing and assessed the wound. Had granulation tissue on the inside bottom left of the abscess. We repacked the abscess and will have them follow up next week again to assess the healing.     06/20/2022    2:31 PM 04/05/2022   11:33 AM 12/27/2021   11:10 AM 08/26/2021    2:14 PM 04/07/2021    3:01 PM  Depression screen PHQ 2/9  Decreased Interest 3 0 0 0 0  Down, Depressed, Hopeless 2 1 0 0 0  PHQ - 2 Score 5 1 0 0 0  Altered sleeping 2  0    Tired, decreased energy 2  0    Change in appetite 0  0    Feeling bad or failure about yourself  0  0    Trouble concentrating 2  0    Moving slowly or fidgety/restless 2  0    Suicidal thoughts 0  0    PHQ-9 Score 13  0    Difficult doing work/chores Extremely dIfficult Not difficult at all Not difficult at all          06/20/2022    2:30 PM  Fall Risk   Falls in the past year? 0  Number falls in past yr: 0  Injury with Fall? 0  Risk for fall due to : Impaired mobility;Mental status change;History of fall(s)  Follow up Falls evaluation completed    Patient Care Team: Blane Ohara, MD as PCP - General (Family Medicine)   Review of Systems  Constitutional:  Negative for chills and fever.  HENT:  Negative for congestion.   Respiratory:  Negative for cough and shortness of breath.   Cardiovascular:  Negative for chest pain.  Genitourinary:  Negative for urgency.  Musculoskeletal:  Negative for back pain.    Current Outpatient Medications on File Prior to Visit   Medication Sig Dispense Refill   acetaminophen-codeine (TYLENOL #3) 300-30 MG tablet Take 1 tablet by mouth every 6 (six) hours as needed for moderate pain. TAKE 1 TABLET EVERY 4 HOURS AS NEEDED FOR MODERATE PAIN Strength: 300-30 mg 18 tablet 0   albuterol (VENTOLIN HFA) 108 (90 Base) MCG/ACT inhaler Inhale 1 puff into the lungs as needed for wheezing or shortness of breath.     atorvastatin (LIPITOR) 80 MG tablet TAKE 1 TABLET EVERY DAY (Patient taking differently: Take 80 mg by mouth daily.) 90 tablet 2   ELIQUIS 5 MG TABS tablet Take 5 mg by mouth 2 (two) times daily.     furosemide (LASIX) 40 MG tablet Take 1 tablet (40 mg total) by mouth every other day. (Patient taking differently: Take 40 mg by mouth daily as needed for fluid or edema.) 90 tablet 2   haloperidol (HALDOL) 2 MG/ML solution Place 1 mL (2 mg total) under the tongue every 4 (four) hours as needed for agitation (or delirium). 15 mL 0   hyoscyamine (LEVSIN SL) 0.125 MG SL tablet Place under the tongue.  ipratropium-albuterol (DUONEB) 0.5-2.5 (3) MG/3ML SOLN Take 3 mLs by nebulization every 4 (four) hours as needed.     levothyroxine (SYNTHROID) 125 MCG tablet Take 1 tablet (125 mcg total) by mouth daily. 90 tablet 1   LORazepam (ATIVAN) 2 MG/ML concentrated solution Place 0.5 mLs (1 mg total) under the tongue every 4 (four) hours as needed for anxiety. 30 mL 0   melatonin 3 MG TABS tablet Take 3 mg by mouth at bedtime as needed.     metoprolol tartrate (LOPRESSOR) 25 MG tablet Take 1/2 tablet (12.5 mg total) by mouth 2 (two) times daily. 30 tablet 2   Naphazoline HCl (CLEAR EYES OP) Place 1-2 drops into both eyes as needed (dry eye).     Omega-3 Fatty Acids (FISH OIL) 1200 MG CPDR Take 1 capsule by mouth daily.     pantoprazole (PROTONIX) 40 MG tablet Take 1 tablet (40 mg total) by mouth daily. 90 tablet 3   Potassium Bicarbonate 99 MG CAPS Take 99 mg by mouth daily.     rOPINIRole (REQUIP) 2 MG tablet Take 2 mg by mouth at  bedtime.     SYMBICORT 160-4.5 MCG/ACT inhaler Inhale 2 puffs into the lungs daily.     zinc gluconate 50 MG tablet Take 50 mg by mouth daily.     No current facility-administered medications on file prior to visit.   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   History of recurrent TIAs 12/11/2014   Impacted cerumen 11/26/2019   Malignant neoplasm of base of tongue (HCC) 01/21/2015   Malignant neoplasm of larynx, unspecified (HCC)    Migraine 04/02/2014   Mixed hyperlipidemia    Other fatigue    Other specified hypothyroidism    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    Restless legs syndrome    Restrictive lung disease 11/26/2019   TIA (transient ischemic attack) 11/24/2014   Vision exam with abnormal findings concern for left retinal artery occlusion 03/29/2021   Past Surgical History:  Procedure Laterality Date   IR CT HEAD LTD  03/06/2022   IR PERCUTANEOUS ART THROMBECTOMY/INFUSION INTRACRANIAL INC DIAG ANGIO  03/06/2022   LOOP RECORDER INSERTION N/A 04/04/2022   Procedure: LOOP RECORDER INSERTION;  Surgeon: Lanier Prude, MD;  Location: [facility] INVASIVE CV LAB;  Service: Cardiovascular;  Laterality: N/A;   LYMPHADENECTOMY     PERIPHERAL VASCULAR CATHETERIZATION N/A 11/24/2014   Procedure: Aortic Arch Angiography;  Surgeon: Chuck Hint, MD;  Location: [facility] INVASIVE CV LAB;  Service: Cardiovascular;  Laterality: N/A;   PERIPHERAL VASCULAR CATHETERIZATION N/A 11/24/2014   Procedure: Cerebral Angiography;  Surgeon: Chuck Hint, MD;  Location: [facility] INVASIVE CV LAB;  Service: Cardiovascular;   Laterality: N/A;   RADIOLOGY WITH ANESTHESIA N/A 03/06/2022   Procedure: RADIOLOGY WITH ANESTHESIA;  Surgeon: Radiologist, Medication, MD;  Location: [facility] OR;  Service: Radiology;  Laterality: N/A;   THROAT SURGERY     TONSILECTOMY/ADENOIDECTOMY WITH MYRINGOTOMY      Family History  Problem Relation Age of Onset   Other Father        amputation   Deep vein thrombosis Father    Heart disease Father  Atrial Fib.   Cancer Sister    Hypertension Brother    Social History   Socioeconomic History   Marital status: Divorced    Spouse name: Not on file   Number of children: Not on file   Years of education: Not on file   Highest education level: Not on file  Occupational History   Not on file  Tobacco Use   Smoking status: Former    Packs/day: 1.00    Years: 41.00    Additional pack years: 0.00    Total pack years: 41.00    Types: Cigarettes    Quit date: 02/15/2007    Years since quitting: 15.4   Smokeless tobacco: Former    Types: Associate Professor Use: Never used  Substance and Sexual Activity   Alcohol use: No    Alcohol/week: 0.0 standard drinks of alcohol   Drug use: No   Sexual activity: Not Currently    Partners: Female  Other Topics Concern   Not on file  Social History Narrative   Not on file   Social Determinants of Health   Financial Resource Strain: Low Risk  (08/26/2021)   Overall Financial Resource Strain (CARDIA)    Difficulty of Paying Living Expenses: Not hard at all  Food Insecurity: No Food Insecurity (04/05/2022)   Hunger Vital Sign    Worried About Running Out of Food in the Last Year: Never true    Ran Out of Food in the Last Year: Never true  Transportation Needs: No Transportation Needs (04/05/2022)   PRAPARE - Administrator, Civil Service (Medical): No    Lack of Transportation (Non-Medical): No  Physical Activity: Inactive (08/26/2021)   Exercise Vital Sign    Days of Exercise per Week: 0 days    Minutes of Exercise  per Session: 0 min  Stress: No Stress Concern Present (08/26/2021)   [employer] of Occupational Health - Occupational Stress Questionnaire    Feeling of Stress : Not at all  Social Connections: Moderately Isolated (08/26/2021)   Social Connection and Isolation Panel [NHANES]    Frequency of Communication with Friends and Family: More than three times a week    Frequency of Social Gatherings with Friends and Family: More than three times a week    Attends Religious Services: More than 4 times per year    Active Member of [employer] or Organizations: No    Attends Engineer, structural: Never    Marital Status: Divorced    Objective:  BP 132/72   Pulse (!) 54   Temp (!) 97.1 F (36.2 C)   Ht 5\' 5"  (1.651 m)   Wt 159 lb (72.1 kg)   SpO2 100%   BMI 26.46 kg/m      07/19/2022    1:25 PM 07/12/2022   10:16 AM 07/08/2022   10:36 AM  BP/Weight  Systolic BP 140 120 132  Diastolic BP 70 60 72  Wt. (Lbs) 156 156 159  BMI 25.96 kg/m2 25.96 kg/m2 26.46 kg/m2    Physical Exam Skin:    Findings: Abscess present.          Comments: 1. I AND D PERFORMED IN ED AND HAS BEEN RETURNING WEEKLY FOR PACKING/OBSERVATION. 2.  NEW ABSCESSED EIC I AND D PERFORMED IN OFFICE. WOUND HEALING WELL, RETURNING WEEKLY FOR PACKING     Diabetic Foot Exam - Simple   No data filed      Lab Results  Component Value Date   WBC 7.3 04/14/2022   HGB 13.5 04/14/2022   HCT 40.2 04/14/2022   PLT 259 04/14/2022   GLUCOSE 91 04/14/2022   CHOL 98 04/02/2022   TRIG 81 04/11/2022   HDL 46 04/02/2022   LDLCALC 43 04/02/2022   ALT 41 04/09/2022   AST 36 04/09/2022   NA 139 04/14/2022   K 3.2 (L) 04/14/2022   CL 107 04/14/2022   CREATININE 0.83 04/14/2022   BUN 8 04/14/2022   CO2 23 04/14/2022   TSH 0.774 08/24/2021   INR 1.0 04/09/2022   HGBA1C 5.7 (H) 03/06/2022      Assessment & Plan:    Abscess of back Assessment & Plan: Will follow up next week to continue to monitor for recurrent  infection. Initial abscess drained at ER is almost fully closed, Removed packing for the last time and instructed the patient to only put antibacterial ointment in it and cover with band aide.   Epidermal inclusion cyst Assessment & Plan: Wound continues to heal well.  Patient will continue to change bandage at home one to two times daily.  Patient will return only if it shows signs of reinfection.      No orders of the defined types were placed in this encounter.   No orders of the defined types were placed in this encounter.    Follow-up: No follow-ups on file.   I,Katherina A Bramblett,acting as a scribe for [employer], PA.,have documented all relevant documentation on the behalf of Langley Gauss, PA,as directed by  Langley Gauss, PA while in the presence of Langley Gauss, [place].   An After Visit Summary was printed and given to the patient.  Langley Gauss, [place] [facility] [phone]

## 2022-07-08 NOTE — Assessment & Plan Note (Addendum)
Will follow up next week to continue to monitor for recurrent infection. Initial abscess drained at ER is almost fully closed, Removed packing for the last time and instructed the patient to only put antibacterial ointment in it and cover with band aide.

## 2022-07-12 ENCOUNTER — Encounter: Payer: Self-pay | Admitting: Physician Assistant

## 2022-07-12 ENCOUNTER — Ambulatory Visit (INDEPENDENT_AMBULATORY_CARE_PROVIDER_SITE_OTHER): Payer: Medicare PPO | Admitting: Physician Assistant

## 2022-07-12 VITALS — BP 120/60 | HR 60 | Temp 97.5°F | Resp 14 | Ht 65.0 in | Wt 156.0 lb

## 2022-07-12 DIAGNOSIS — L720 Epidermal cyst: Secondary | ICD-10-CM

## 2022-07-12 DIAGNOSIS — L02212 Cutaneous abscess of back [any part, except buttock]: Secondary | ICD-10-CM

## 2022-07-12 NOTE — Progress Notes (Signed)
Subjective:  Patient ID: James Richards, male    DOB: 1945/09/15  Age: 77 y.o. MRN: [id number]  Chief Complaint  Patient presents with   Wound Check    HPI   Patient is here for wound check. He has been seen for a abscess on his back for almost one month. It has been packed. It has a yellowish discharge. His cousin is with him because he is not hearing well. She said he did not have any fever, chills, tenderness, or warm in the area or redness.       06/20/2022    2:31 PM 04/05/2022   11:33 AM 12/27/2021   11:10 AM 08/26/2021    2:14 PM 04/07/2021    3:01 PM  Depression screen PHQ 2/9  Decreased Interest 3 0 0 0 0  Down, Depressed, Hopeless 2 1 0 0 0  PHQ - 2 Score 5 1 0 0 0  Altered sleeping 2  0    Tired, decreased energy 2  0    Change in appetite 0  0    Feeling bad or failure about yourself  0  0    Trouble concentrating 2  0    Moving slowly or fidgety/restless 2  0    Suicidal thoughts 0  0    PHQ-9 Score 13  0    Difficult doing work/chores Extremely dIfficult Not difficult at all Not difficult at all          06/20/2022    2:30 PM  Fall Risk   Falls in the past year? 0  Number falls in past yr: 0  Injury with Fall? 0  Risk for fall due to : Impaired mobility;Mental status change;History of fall(s)  Follow up Falls evaluation completed    Patient Care Team: Blane Ohara, MD as PCP - General (Family Medicine)   Review of Systems  Constitutional:  Negative for chills and fever.  HENT:  Negative for congestion and sore throat.   Respiratory:  Positive for cough (at night). Negative for shortness of breath.   Cardiovascular:  Negative for chest pain.  Gastrointestinal:  Negative for abdominal pain, constipation, diarrhea, nausea and vomiting.  Skin:  Positive for wound (yellowish discharge).    Current Outpatient Medications on File Prior to Visit  Medication Sig Dispense Refill   acetaminophen-codeine (TYLENOL #3) 300-30 MG tablet Take 1 tablet by mouth  every 6 (six) hours as needed for moderate pain. TAKE 1 TABLET EVERY 4 HOURS AS NEEDED FOR MODERATE PAIN Strength: 300-30 mg 18 tablet 0   albuterol (VENTOLIN HFA) 108 (90 Base) MCG/ACT inhaler Inhale 1 puff into the lungs as needed for wheezing or shortness of breath.     atorvastatin (LIPITOR) 80 MG tablet TAKE 1 TABLET EVERY DAY (Patient taking differently: Take 80 mg by mouth daily.) 90 tablet 2   ELIQUIS 5 MG TABS tablet Take 5 mg by mouth 2 (two) times daily.     furosemide (LASIX) 40 MG tablet Take 1 tablet (40 mg total) by mouth every other day. (Patient taking differently: Take 40 mg by mouth daily as needed for fluid or edema.) 90 tablet 2   haloperidol (HALDOL) 2 MG/ML solution Place 1 mL (2 mg total) under the tongue every 4 (four) hours as needed for agitation (or delirium). 15 mL 0   hyoscyamine (LEVSIN SL) 0.125 MG SL tablet Place under the tongue.     ipratropium-albuterol (DUONEB) 0.5-2.5 (3) MG/3ML SOLN Take 3 mLs by nebulization  every 4 (four) hours as needed.     levothyroxine (SYNTHROID) 125 MCG tablet Take 1 tablet (125 mcg total) by mouth daily. 90 tablet 1   LORazepam (ATIVAN) 2 MG/ML concentrated solution Place 0.5 mLs (1 mg total) under the tongue every 4 (four) hours as needed for anxiety. 30 mL 0   melatonin 3 MG TABS tablet Take 3 mg by mouth at bedtime as needed.     metoprolol tartrate (LOPRESSOR) 25 MG tablet Take 1/2 tablet (12.5 mg total) by mouth 2 (two) times daily. 30 tablet 2   Naphazoline HCl (CLEAR EYES OP) Place 1-2 drops into both eyes as needed (dry eye).     Omega-3 Fatty Acids (FISH OIL) 1200 MG CPDR Take 1 capsule by mouth daily.     pantoprazole (PROTONIX) 40 MG tablet Take 1 tablet (40 mg total) by mouth daily. 90 tablet 3   Potassium Bicarbonate 99 MG CAPS Take 99 mg by mouth daily.     rOPINIRole (REQUIP) 2 MG tablet Take 2 mg by mouth at bedtime.     SYMBICORT 160-4.5 MCG/ACT inhaler Inhale 2 puffs into the lungs daily.     zinc gluconate 50 MG  tablet Take 50 mg by mouth daily.     No current facility-administered medications on file prior to visit.   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   History of recurrent TIAs 12/11/2014   Impacted cerumen 11/26/2019   Malignant neoplasm of base of tongue (HCC) 01/21/2015   Malignant neoplasm of larynx, unspecified (HCC)    Migraine 04/02/2014   Mixed hyperlipidemia    Other fatigue    Other specified hypothyroidism    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    Restless legs syndrome    Restrictive lung disease 11/26/2019   TIA (transient ischemic attack) 11/24/2014   Vision exam with abnormal findings concern for left retinal artery occlusion 03/29/2021   Past Surgical History:  Procedure Laterality Date   IR CT HEAD LTD  03/06/2022   IR PERCUTANEOUS ART THROMBECTOMY/INFUSION INTRACRANIAL INC DIAG ANGIO  03/06/2022   LOOP RECORDER INSERTION N/A 04/04/2022   Procedure: LOOP RECORDER INSERTION;  Surgeon: Lanier Prude, MD;  Location: [facility] INVASIVE CV LAB;  Service: Cardiovascular;  Laterality: N/A;   LYMPHADENECTOMY     PERIPHERAL VASCULAR CATHETERIZATION N/A 11/24/2014   Procedure: Aortic Arch Angiography;  Surgeon: Chuck Hint, MD;  Location: [facility] INVASIVE CV LAB;  Service: Cardiovascular;  Laterality: N/A;   PERIPHERAL VASCULAR CATHETERIZATION N/A 11/24/2014   Procedure: Cerebral Angiography;  Surgeon: Chuck Hint, MD;  Location: [facility] INVASIVE CV LAB;  Service: Cardiovascular;  Laterality: N/A;   RADIOLOGY WITH ANESTHESIA N/A 03/06/2022   Procedure: RADIOLOGY WITH ANESTHESIA;  Surgeon:  Radiologist, Medication, MD;  Location: [facility] OR;  Service: Radiology;  Laterality: N/A;   THROAT SURGERY     TONSILECTOMY/ADENOIDECTOMY WITH MYRINGOTOMY      Family History  Problem Relation Age of Onset   Other Father        amputation   Deep vein thrombosis Father    Heart disease Father        Atrial Fib.   Cancer Sister    Hypertension Brother  Social History   Socioeconomic History   Marital status: Divorced    Spouse name: Not on file   Number of children: Not on file   Years of education: Not on file   Highest education level: Not on file  Occupational History   Not on file  Tobacco Use   Smoking status: Former    Packs/day: 1.00    Years: 41.00    Additional pack years: 0.00    Total pack years: 41.00    Types: Cigarettes    Quit date: 02/15/2007    Years since quitting: 15.4   Smokeless tobacco: Former    Types: Associate Professor Use: Never used  Substance and Sexual Activity   Alcohol use: No    Alcohol/week: 0.0 standard drinks of alcohol   Drug use: No   Sexual activity: Not Currently    Partners: Female  Other Topics Concern   Not on file  Social History Narrative   Not on file   Social Determinants of Health   Financial Resource Strain: Low Risk  (08/26/2021)   Overall Financial Resource Strain (CARDIA)    Difficulty of Paying Living Expenses: Not hard at all  Food Insecurity: No Food Insecurity (04/05/2022)   Hunger Vital Sign    Worried About Running Out of Food in the Last Year: Never true    Ran Out of Food in the Last Year: Never true  Transportation Needs: No Transportation Needs (04/05/2022)   PRAPARE - Administrator, Civil Service (Medical): No    Lack of Transportation (Non-Medical): No  Physical Activity: Inactive (08/26/2021)   Exercise Vital Sign    Days of Exercise per Week: 0 days    Minutes of Exercise per Session: 0 min  Stress: No Stress Concern Present (08/26/2021)   [employer] of Occupational Health  - Occupational Stress Questionnaire    Feeling of Stress : Not at all  Social Connections: Moderately Isolated (08/26/2021)   Social Connection and Isolation Panel [NHANES]    Frequency of Communication with Friends and Family: More than three times a week    Frequency of Social Gatherings with Friends and Family: More than three times a week    Attends Religious Services: More than 4 times per year    Active Member of [employer] or Organizations: No    Attends Engineer, structural: Never    Marital Status: Divorced    Objective:  BP 120/60   Pulse 60   Temp (!) 97.5 F (36.4 C)   Resp 14   Ht 5\' 5"  (1.651 m)   Wt 156 lb (70.8 kg)   SpO2 98%   BMI 25.96 kg/m      07/19/2022    1:25 PM 07/12/2022   10:16 AM 07/08/2022   10:36 AM  BP/Weight  Systolic BP 140 120 132  Diastolic BP 70 60 72  Wt. (Lbs) 156 156 159  BMI 25.96 kg/m2 25.96 kg/m2 26.46 kg/m2    Physical Exam Skin:    Findings: Abscess present.          Comments: 1. I AND D PERFORMED IN ED AND HAS BEEN RETURNING WEEKLY FOR PACKING/OBSERVATION. 2.  NEW ABSCESSED EIC I AND D PERFORMED IN OFFICE. WOUND HEALING WELL, RETURNING WEEKLY FOR PACKING     Diabetic Foot Exam - Simple   No data filed      Lab Results  Component Value Date   WBC 7.3 04/14/2022   HGB  13.5 04/14/2022   HCT 40.2 04/14/2022   PLT 259 04/14/2022   GLUCOSE 91 04/14/2022   CHOL 98 04/02/2022   TRIG 81 04/11/2022   HDL 46 04/02/2022   LDLCALC 43 04/02/2022   ALT 41 04/09/2022   AST 36 04/09/2022   NA 139 04/14/2022   K 3.2 (L) 04/14/2022   CL 107 04/14/2022   CREATININE 0.83 04/14/2022   BUN 8 04/14/2022   CO2 23 04/14/2022   TSH 0.774 08/24/2021   INR 1.0 04/09/2022   HGBA1C 5.7 (H) 03/06/2022      Assessment & Plan:    Abscess of back Assessment & Plan: Will follow up next week to continue to monitor for recurrent infection. Initial abscess drained at ER is almost fully closed, Removed packing for the last time and  instructed the patient to only put antibacterial ointment in it and cover with band aide.   Epidermal inclusion cyst Assessment & Plan: Wound continues to heal well.  Patient will continue to change bandage at home one to two times daily.  Patient will return only if it shows signs of reinfection.      No orders of the defined types were placed in this encounter.   No orders of the defined types were placed in this encounter.    Follow-up: No follow-ups on file.   I,Marla I Leal-Borjas,acting as a scribe for [employer], PA.,have documented all relevant documentation on the behalf of Langley Gauss, PA,as directed by  Langley Gauss, PA while in the presence of Langley Gauss, [place].   An After Visit Summary was printed and given to the patient.  Langley Gauss, [place] [facility] [phone]

## 2022-07-17 DIAGNOSIS — L720 Epidermal cyst: Secondary | ICD-10-CM | POA: Insufficient documentation

## 2022-07-17 NOTE — Addendum Note (Signed)
Addended byBlane Ohara on: 07/17/2022 08:19 PM   Modules accepted: Orders, Level of Service

## 2022-07-17 NOTE — Assessment & Plan Note (Signed)
06/24/2022: Repacked wound and will follow up next week to monitor  Started on doxycycline.

## 2022-07-17 NOTE — Assessment & Plan Note (Signed)
Started on doxycycline.  Consider I and D next week if not improved.

## 2022-07-19 ENCOUNTER — Encounter: Payer: Self-pay | Admitting: Physician Assistant

## 2022-07-19 ENCOUNTER — Ambulatory Visit (INDEPENDENT_AMBULATORY_CARE_PROVIDER_SITE_OTHER): Admitting: Physician Assistant

## 2022-07-19 VITALS — BP 140/70 | HR 54 | Temp 98.3°F | Resp 14 | Ht 65.0 in | Wt 156.0 lb

## 2022-07-19 DIAGNOSIS — L02212 Cutaneous abscess of back [any part, except buttock]: Secondary | ICD-10-CM

## 2022-07-19 NOTE — Progress Notes (Signed)
Subjective:  Patient ID: James Richards, male    DOB: Sep 22, 1945  Age: 77 y.o. MRN: [id number]  Chief Complaint  Patient presents with   Wound Check    HPI   James Richards who is accompanied by Pieter Partridge (Cousin-Caregiver) comes in for recheck of the abscess on his back that was drained and excised in our office and packed with packing. Has been cleaning area with saline or dial soap and changing dressing TWICE DAILY. Reports mild drainage but odorless.     06/20/2022    2:31 PM 04/05/2022   11:33 AM 12/27/2021   11:10 AM 08/26/2021    2:14 PM 04/07/2021    3:01 PM  Depression screen PHQ 2/9  Decreased Interest 3 0 0 0 0  Down, Depressed, Hopeless 2 1 0 0 0  PHQ - 2 Score 5 1 0 0 0  Altered sleeping 2  0    Tired, decreased energy 2  0    Change in appetite 0  0    Feeling bad or failure about yourself  0  0    Trouble concentrating 2  0    Moving slowly or fidgety/restless 2  0    Suicidal thoughts 0  0    PHQ-9 Score 13  0    Difficult doing work/chores Extremely dIfficult Not difficult at all Not difficult at all          06/20/2022    2:30 PM  Fall Risk   Falls in the past year? 0  Number falls in past yr: 0  Injury with Fall? 0  Risk for fall due to : Impaired mobility;Mental status change;History of fall(s)  Follow up Falls evaluation completed    Patient Care Team: Blane Ohara, MD as PCP - General (Family Medicine)   Review of Systems  Constitutional:  Negative for chills, fatigue, fever and unexpected weight change.  HENT:  Negative for congestion, ear pain, sinus pain and sore throat.   Cardiovascular:  Negative for chest pain and palpitations.  Gastrointestinal:  Negative for abdominal pain, blood in stool, constipation, diarrhea, nausea and vomiting.  Endocrine: Negative for polydipsia.  Genitourinary:  Negative for dysuria.  Musculoskeletal:  Negative for back pain.  Skin:  Positive for wound. Negative for rash.  Neurological:  Negative for headaches.     Current Outpatient Medications on File Prior to Visit  Medication Sig Dispense Refill   acetaminophen-codeine (TYLENOL #3) 300-30 MG tablet Take 1 tablet by mouth every 6 (six) hours as needed for moderate pain. TAKE 1 TABLET EVERY 4 HOURS AS NEEDED FOR MODERATE PAIN Strength: 300-30 mg 18 tablet 0   albuterol (VENTOLIN HFA) 108 (90 Base) MCG/ACT inhaler Inhale 1 puff into the lungs as needed for wheezing or shortness of breath.     atorvastatin (LIPITOR) 80 MG tablet TAKE 1 TABLET EVERY DAY (Patient taking differently: Take 80 mg by mouth daily.) 90 tablet 2   ELIQUIS 5 MG TABS tablet Take 5 mg by mouth 2 (two) times daily.     furosemide (LASIX) 40 MG tablet Take 1 tablet (40 mg total) by mouth every other day. (Patient taking differently: Take 40 mg by mouth daily as needed for fluid or edema.) 90 tablet 2   haloperidol (HALDOL) 2 MG/ML solution Place 1 mL (2 mg total) under the tongue every 4 (four) hours as needed for agitation (or delirium). 15 mL 0   hyoscyamine (LEVSIN SL) 0.125 MG SL tablet Place under the tongue.  ipratropium-albuterol (DUONEB) 0.5-2.5 (3) MG/3ML SOLN Take 3 mLs by nebulization every 4 (four) hours as needed.     levothyroxine (SYNTHROID) 125 MCG tablet Take 1 tablet (125 mcg total) by mouth daily. 90 tablet 1   LORazepam (ATIVAN) 2 MG/ML concentrated solution Place 0.5 mLs (1 mg total) under the tongue every 4 (four) hours as needed for anxiety. 30 mL 0   melatonin 3 MG TABS tablet Take 3 mg by mouth at bedtime as needed.     metoprolol tartrate (LOPRESSOR) 25 MG tablet Take 1/2 tablet (12.5 mg total) by mouth 2 (two) times daily. 30 tablet 2   Naphazoline HCl (CLEAR EYES OP) Place 1-2 drops into both eyes as needed (dry eye).     Omega-3 Fatty Acids (FISH OIL) 1200 MG CPDR Take 1 capsule by mouth daily.     pantoprazole (PROTONIX) 40 MG tablet Take 1 tablet (40 mg total) by mouth daily. 90 tablet 3   Potassium Bicarbonate 99 MG CAPS Take 99 mg by mouth daily.      rOPINIRole (REQUIP) 2 MG tablet Take 2 mg by mouth at bedtime.     SYMBICORT 160-4.5 MCG/ACT inhaler Inhale 2 puffs into the lungs daily.     zinc gluconate 50 MG tablet Take 50 mg by mouth daily.     No current facility-administered medications on file prior to visit.   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   History of recurrent TIAs 12/11/2014   Impacted cerumen 11/26/2019   Malignant neoplasm of base of tongue (HCC) 01/21/2015   Malignant neoplasm of larynx, unspecified (HCC)    Migraine 04/02/2014   Mixed hyperlipidemia    Other fatigue    Other specified hypothyroidism    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    Restless legs syndrome    Restrictive lung disease 11/26/2019   TIA (transient ischemic attack) 11/24/2014   Vision exam with abnormal findings concern for left retinal artery occlusion 03/29/2021   Past Surgical History:  Procedure Laterality Date   IR CT HEAD LTD  03/06/2022   IR PERCUTANEOUS ART THROMBECTOMY/INFUSION INTRACRANIAL INC DIAG ANGIO  03/06/2022   LOOP RECORDER INSERTION N/A 04/04/2022   Procedure: LOOP RECORDER INSERTION;  Surgeon: Lanier Prude, MD;  Location: [facility] INVASIVE CV LAB;  Service: Cardiovascular;  Laterality: N/A;   LYMPHADENECTOMY     PERIPHERAL VASCULAR CATHETERIZATION N/A 11/24/2014   Procedure: Aortic Arch Angiography;  Surgeon: Chuck Hint, MD;  Location: [facility] INVASIVE CV LAB;  Service: Cardiovascular;  Laterality: N/A;   PERIPHERAL VASCULAR CATHETERIZATION N/A 11/24/2014   Procedure: Cerebral Angiography;  Surgeon: Chuck Hint, MD;   Location: [facility] INVASIVE CV LAB;  Service: Cardiovascular;  Laterality: N/A;   RADIOLOGY WITH ANESTHESIA N/A 03/06/2022   Procedure: RADIOLOGY WITH ANESTHESIA;  Surgeon: Radiologist, Medication, MD;  Location: [facility] OR;  Service: Radiology;  Laterality: N/A;   THROAT SURGERY     TONSILECTOMY/ADENOIDECTOMY WITH MYRINGOTOMY      Family History  Problem Relation Age of Onset   Other Father        amputation   Deep vein thrombosis Father    Heart disease Father  Atrial Fib.   Cancer Sister    Hypertension Brother    Social History   Socioeconomic History   Marital status: Divorced    Spouse name: Not on file   Number of children: Not on file   Years of education: Not on file   Highest education level: Not on file  Occupational History   Not on file  Tobacco Use   Smoking status: Former    Packs/day: 1.00    Years: 41.00    Additional pack years: 0.00    Total pack years: 41.00    Types: Cigarettes    Quit date: 02/15/2007    Years since quitting: 15.4   Smokeless tobacco: Former    Types: Associate Professor Use: Never used  Substance and Sexual Activity   Alcohol use: No    Alcohol/week: 0.0 standard drinks of alcohol   Drug use: No   Sexual activity: Not Currently    Partners: Female  Other Topics Concern   Not on file  Social History Narrative   Not on file   Social Determinants of Health   Financial Resource Strain: Low Risk  (08/26/2021)   Overall Financial Resource Strain (CARDIA)    Difficulty of Paying Living Expenses: Not hard at all  Food Insecurity: No Food Insecurity (04/05/2022)   Hunger Vital Sign    Worried About Running Out of Food in the Last Year: Never true    Ran Out of Food in the Last Year: Never true  Transportation Needs: No Transportation Needs (04/05/2022)   PRAPARE - Administrator, Civil Service (Medical): No    Lack of Transportation (Non-Medical): No  Physical Activity: Inactive (08/26/2021)   Exercise Vital Sign     Days of Exercise per Week: 0 days    Minutes of Exercise per Session: 0 min  Stress: No Stress Concern Present (08/26/2021)   [employer] of Occupational Health - Occupational Stress Questionnaire    Feeling of Stress : Not at all  Social Connections: Moderately Isolated (08/26/2021)   Social Connection and Isolation Panel [NHANES]    Frequency of Communication with Friends and Family: More than three times a week    Frequency of Social Gatherings with Friends and Family: More than three times a week    Attends Religious Services: More than 4 times per year    Active Member of [employer] or Organizations: No    Attends Engineer, structural: Never    Marital Status: Divorced    Objective:  BP (!) 140/70   Pulse (!) 54   Temp 98.3 F (36.8 C)   Resp 14   Ht 5\' 5"  (1.651 m)   Wt 156 lb (70.8 kg)   SpO2 100%   BMI 25.96 kg/m      07/19/2022    1:25 PM 07/12/2022   10:16 AM 07/08/2022   10:36 AM  BP/Weight  Systolic BP 140 120 132  Diastolic BP 70 60 72  Wt. (Lbs) 156 156 159  BMI 25.96 kg/m2 25.96 kg/m2 26.46 kg/m2    Physical Exam  Diabetic Foot Exam - Simple   No data filed      Lab Results  Component Value Date   WBC 7.3 04/14/2022   HGB 13.5 04/14/2022   HCT 40.2 04/14/2022   PLT 259 04/14/2022   GLUCOSE 91 04/14/2022   CHOL 98 04/02/2022   TRIG 81 04/11/2022   HDL 46 04/02/2022   LDLCALC 43 04/02/2022  ALT 41 04/09/2022   AST 36 04/09/2022   NA 139 04/14/2022   K 3.2 (L) 04/14/2022   CL 107 04/14/2022   CREATININE 0.83 04/14/2022   BUN 8 04/14/2022   CO2 23 04/14/2022   TSH 0.774 08/24/2021   INR 1.0 04/09/2022   HGBA1C 5.7 (H) 03/06/2022      Assessment & Plan:    Abscess of back     No orders of the defined types were placed in this encounter.   No orders of the defined types were placed in this encounter.    Follow-up: No follow-ups on file.   I,Marla I Leal-Borjas,acting as a scribe for [employer], PA.,have documented  all relevant documentation on the behalf of Langley Gauss, PA,as directed by  Langley Gauss, PA while in the presence of Langley Gauss, [place].   An After Visit Summary was printed and given to the patient.  Langley Gauss, [place] [facility] [phone]

## 2022-07-20 DIAGNOSIS — L02212 Cutaneous abscess of back [any part, except buttock]: Secondary | ICD-10-CM | POA: Insufficient documentation

## 2022-07-20 NOTE — Assessment & Plan Note (Signed)
Wound continues to heal well.  Patient will continue to change bandage at home one to two times daily.  Patient will return only if it shows signs of reinfection. 

## 2022-07-20 NOTE — Assessment & Plan Note (Signed)
Wound continues to heal well.  Patient will continue to change bandage at home one to two times daily.  Patient will return only if it shows signs of reinfection.

## 2022-07-20 NOTE — Assessment & Plan Note (Signed)
Will follow up next week to continue to monitor for recurrent infection. Initial abscess drained at ER is almost fully closed, Removed packing for the last time and instructed the patient to only put antibacterial ointment in it and cover with band aide.

## 2022-08-22 ENCOUNTER — Encounter: Payer: Self-pay | Admitting: Physician Assistant

## 2022-08-22 ENCOUNTER — Ambulatory Visit (INDEPENDENT_AMBULATORY_CARE_PROVIDER_SITE_OTHER): Payer: Medicare PPO | Admitting: Physician Assistant

## 2022-08-22 VITALS — BP 110/66 | HR 58 | Temp 97.6°F | Ht 65.0 in | Wt 159.2 lb

## 2022-08-22 DIAGNOSIS — E038 Other specified hypothyroidism: Secondary | ICD-10-CM

## 2022-08-22 DIAGNOSIS — L02212 Cutaneous abscess of back [any part, except buttock]: Secondary | ICD-10-CM | POA: Diagnosis not present

## 2022-08-22 DIAGNOSIS — E782 Mixed hyperlipidemia: Secondary | ICD-10-CM | POA: Diagnosis not present

## 2022-08-22 MED ORDER — ATORVASTATIN CALCIUM 80 MG PO TABS: 80.0000 mg | ORAL_TABLET | Freq: Every day | ORAL | 2 refills | Status: DC

## 2022-08-22 NOTE — Assessment & Plan Note (Signed)
Controlled Denies any side effects or issues taking the medication Continue to take Lipitor 80mg  as directed Will adjust treatment dependent on labs

## 2022-08-22 NOTE — Assessment & Plan Note (Signed)
Controlled Labs drawn Currently taking Will adjust treatment as needed depending on results

## 2022-08-22 NOTE — Progress Notes (Signed)
Subjective:  Patient ID: James Richards, male    DOB: August 09, 1945  Age: 77 y.o. MRN: [id number]  Chief Complaint  Patient presents with   Medical Management of Chronic Issues    HPI Patient presents for follow-up of HTN, Hypothyroidism, and GERD. He has idiopathic pulmonary fibrosis with chronic hypoxia, O2 dependence at night. Treatment includes Albuterol and Symbicort. His cousin who is his care giver says he has not needed the O2 at night or the medications to help with his breathing. She has monitored his O2 at night with a finger pulse oximeter and says its between 97-100 throughout the night. Discussed with them to continue to monitor for symptoms of hypoxia and to make sure the medications don't expire if he needs them.   Confesor has a history of tongue and larynx cancer, was followed by ENT. Underwent chemo/RT and right neck lymph node dissection in 2009, reoccurrence in 2016, underwent partial glossectomy. He denies dysphagia or sore throat. He has mildly impaired speech.   Abscess on the back looks well healed. One of the lesions in the center of the back still has a minor indentation. Suggested to continue putting neosporin in it to prevent anything from building up inside. Appears closed but hasn't healed fully to the surface of the epidermis. Denies fever, chills, redness, or discharge from any of the previous sites.    Hypothyroidism: Levothyroxine 125 mcg daily  Hyperlipidemia: Lipitor 80 mg daily, Fish oil 1000 mg daily  Hypertension: Complications: Heart Failure Current medications: metoprolol Tartrate 25 mg 0.5 tablet twice daily., Furosemide 40 mg e/o day, Eliquis 5 mg twice daily. Cardiologist monitors medications.  GERD: Protonix 40 mg daily  Anxiety: Lorazepam 2 mg as needed  Restless leg: Ropinirole 2 mg daily. The care giver says a few days ago he started having worsening symptoms during the day to where getting up and moving around was not helping. She then gave  him another Ropinirole to help with his symptoms for the day. They wanted to know if that was ok to do when he would need it. Discussed with them to monitor him for any HA, Nausea, or immediate feelings of somnolence. Let them know that 4mg  was the maximum dose allowed during a day. They said they will save it for days when he is having worsening symptoms and can't get it under control through other means.      08/22/2022   10:58 AM 06/20/2022    2:31 PM 04/05/2022   11:33 AM 12/27/2021   11:10 AM 08/26/2021    2:14 PM  Depression screen PHQ 2/9  Decreased Interest 0 3 0 0 0  Down, Depressed, Hopeless 0 2 1 0 0  PHQ - 2 Score 0 5 1 0 0  Altered sleeping 1 2  0   Tired, decreased energy 1 2  0   Change in appetite 0 0  0   Feeling bad or failure about yourself  0 0  0   Trouble concentrating 0 2  0   Moving slowly or fidgety/restless 0 2  0   Suicidal thoughts 0 0  0   PHQ-9 Score 2 13  0   Difficult doing work/chores Not difficult at all Extremely dIfficult Not difficult at all Not difficult at all         08/22/2022   10:58 AM  Fall Risk   Number falls in past yr: 0  Injury with Fall? 0  Risk for fall due to :  No Fall Risks  Follow up Falls evaluation completed    Patient Care Team: Langley Gauss, [place] as PCP - General (Physician Assistant)   Review of Systems  Constitutional:  Negative for fatigue.  HENT:  Negative for congestion, ear pain and sore throat.   Respiratory:  Negative for cough and shortness of breath.   Cardiovascular:  Negative for chest pain.  Gastrointestinal:  Negative for abdominal pain, constipation, diarrhea, nausea and vomiting.  Genitourinary:  Negative for dysuria, frequency and urgency.  Musculoskeletal:  Negative for arthralgias, back pain and myalgias.  Neurological:  Negative for dizziness and headaches.  Psychiatric/Behavioral:  Negative for agitation and sleep disturbance. The patient is not nervous/anxious.     Current Outpatient Medications on  File Prior to Visit  Medication Sig Dispense Refill   acetaminophen-codeine (TYLENOL #3) 300-30 MG tablet Take 1 tablet by mouth every 6 (six) hours as needed for moderate pain. TAKE 1 TABLET EVERY 4 HOURS AS NEEDED FOR MODERATE PAIN Strength: 300-30 mg 18 tablet 0   albuterol (VENTOLIN HFA) 108 (90 Base) MCG/ACT inhaler Inhale 1 puff into the lungs as needed for wheezing or shortness of breath.     ELIQUIS 5 MG TABS tablet Take 5 mg by mouth 2 (two) times daily.     furosemide (LASIX) 20 MG tablet Take 20 mg by mouth daily as needed.     furosemide (LASIX) 40 MG tablet Take 1 tablet (40 mg total) by mouth every other day. (Patient taking differently: Take 40 mg by mouth daily as needed for fluid or edema.) 90 tablet 2   haloperidol (HALDOL) 2 MG/ML solution Place 1 mL (2 mg total) under the tongue every 4 (four) hours as needed for agitation (or delirium). 15 mL 0   hyoscyamine (LEVSIN SL) 0.125 MG SL tablet Place under the tongue.     ipratropium-albuterol (DUONEB) 0.5-2.5 (3) MG/3ML SOLN Take 3 mLs by nebulization every 4 (four) hours as needed.     levothyroxine (SYNTHROID) 125 MCG tablet Take 1 tablet (125 mcg total) by mouth daily. 90 tablet 1   LORazepam (ATIVAN) 2 MG/ML concentrated solution Place 0.5 mLs (1 mg total) under the tongue every 4 (four) hours as needed for anxiety. 30 mL 0   melatonin 3 MG TABS tablet Take 3 mg by mouth at bedtime as needed.     metoprolol tartrate (LOPRESSOR) 25 MG tablet Take 1/2 tablet (12.5 mg total) by mouth 2 (two) times daily. 30 tablet 2   Naphazoline HCl (CLEAR EYES OP) Place 1-2 drops into both eyes as needed (dry eye).     Omega-3 Fatty Acids (FISH OIL) 1200 MG CPDR Take 1 capsule by mouth daily.     pantoprazole (PROTONIX) 40 MG tablet Take 1 tablet (40 mg total) by mouth daily. 90 tablet 3   Potassium Bicarbonate 99 MG CAPS Take 99 mg by mouth daily.     rOPINIRole (REQUIP) 2 MG tablet Take 2 mg by mouth at bedtime.     SYMBICORT 160-4.5 MCG/ACT  inhaler Inhale 2 puffs into the lungs daily.     No current facility-administered medications on file prior to visit.   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  History of recurrent TIAs 12/11/2014   Impacted cerumen 11/26/2019   Malignant neoplasm of base of tongue (HCC) 01/21/2015   Malignant neoplasm of larynx, unspecified (HCC)    Migraine 04/02/2014   Mixed hyperlipidemia    Other fatigue    Other specified hypothyroidism    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    Restless legs syndrome    Restrictive lung disease 11/26/2019   TIA (transient ischemic attack) 11/24/2014   Vision exam with abnormal findings concern for left retinal artery occlusion 03/29/2021   Past Surgical History:  Procedure Laterality Date   IR CT HEAD LTD  03/06/2022   IR PERCUTANEOUS ART THROMBECTOMY/INFUSION INTRACRANIAL INC DIAG ANGIO  03/06/2022   LOOP RECORDER INSERTION N/A 04/04/2022   Procedure: LOOP RECORDER INSERTION;  Surgeon: Lanier Prude, MD;  Location: [facility] INVASIVE CV LAB;  Service: Cardiovascular;  Laterality: N/A;   LYMPHADENECTOMY     PERIPHERAL VASCULAR CATHETERIZATION N/A 11/24/2014   Procedure: Aortic Arch Angiography;  Surgeon: Chuck Hint, MD;  Location: [facility] INVASIVE CV LAB;  Service: Cardiovascular;  Laterality: N/A;   PERIPHERAL VASCULAR CATHETERIZATION N/A 11/24/2014   Procedure: Cerebral Angiography;  Surgeon: Chuck Hint, MD;  Location: [facility] INVASIVE CV LAB;  Service: Cardiovascular;  Laterality: N/A;   RADIOLOGY WITH ANESTHESIA N/A 03/06/2022   Procedure: RADIOLOGY WITH ANESTHESIA;   Surgeon: Radiologist, Medication, MD;  Location: [facility] OR;  Service: Radiology;  Laterality: N/A;   THROAT SURGERY     TONSILECTOMY/ADENOIDECTOMY WITH MYRINGOTOMY      Family History  Problem Relation Age of Onset   Other Father        amputation   Deep vein thrombosis Father    Heart disease Father        Atrial Fib.   Cancer Sister    Hypertension Brother    Social History   Socioeconomic History   Marital status: Divorced    Spouse name: Not on file   Number of children: Not on file   Years of education: Not on file   Highest education level: Not on file  Occupational History   Not on file  Tobacco Use   Smoking status: Former    Packs/day: 1.00    Years: 41.00    Additional pack years: 0.00    Total pack years: 41.00    Types: Cigarettes    Quit date: 02/15/2007    Years since quitting: 15.5   Smokeless tobacco: Former    Types: Associate Professor Use: Never used  Substance and Sexual Activity   Alcohol use: No    Alcohol/week: 0.0 standard drinks of alcohol   Drug use: No   Sexual activity: Not Currently    Partners: Female  Other Topics Concern   Not on file  Social History Narrative   Not on file   Social Determinants of Health   Financial Resource Strain: Low Risk  (08/26/2021)   Overall Financial Resource Strain (CARDIA)    Difficulty of Paying Living Expenses: Not hard at all  Food Insecurity: No Food Insecurity (04/05/2022)   Hunger Vital Sign    Worried About Running Out of Food in the Last Year: Never true    Ran Out of Food in the Last Year: Never true  Transportation Needs: No Transportation Needs (04/05/2022)   PRAPARE - Administrator, Civil Service (Medical): No    Lack of Transportation (Non-Medical): No  Physical Activity: Inactive (08/26/2021)   Exercise  Vital Sign    Days of Exercise per Week: 0 days    Minutes of Exercise per Session: 0 min  Stress: No Stress Concern Present (08/26/2021)   [employer] of  Occupational Health - Occupational Stress Questionnaire    Feeling of Stress : Not at all  Social Connections: Moderately Isolated (08/26/2021)   Social Connection and Isolation Panel [NHANES]    Frequency of Communication with Friends and Family: More than three times a week    Frequency of Social Gatherings with Friends and Family: More than three times a week    Attends Religious Services: More than 4 times per year    Active Member of [employer] or Organizations: No    Attends Engineer, structural: Never    Marital Status: Divorced    Objective:  BP 110/66 (BP Location: Left Arm, Patient Position: Sitting, Cuff Size: Normal)   Pulse (!) 58   Temp 97.6 F (36.4 C) (Temporal)   Ht 5\' 5"  (1.651 m)   Wt 159 lb 3.2 oz (72.2 kg)   SpO2 98%   BMI 26.49 kg/m      08/22/2022   10:50 AM 07/19/2022    1:25 PM 07/12/2022   10:16 AM  BP/Weight  Systolic BP 110 140 120  Diastolic BP 66 70 60  Wt. (Lbs) 159.2 156 156  BMI 26.49 kg/m2 25.96 kg/m2 25.96 kg/m2    Physical Exam Vitals reviewed.  Constitutional:      Appearance: Normal appearance.  Cardiovascular:     Rate and Rhythm: Normal rate and regular rhythm.     Heart sounds: Normal heart sounds.  Pulmonary:     Effort: Pulmonary effort is normal.     Breath sounds: Normal breath sounds.  Abdominal:     General: Bowel sounds are normal.     Palpations: Abdomen is soft.     Tenderness: There is no abdominal tenderness.  Skin:      Neurological:     Mental Status: He is alert and oriented to person, place, and time.  Psychiatric:        Mood and Affect: Mood normal.        Behavior: Behavior normal.     Diabetic Foot Exam - Simple   No data filed      Lab Results  Component Value Date   WBC 7.3 04/14/2022   HGB 13.5 04/14/2022   HCT 40.2 04/14/2022   PLT 259 04/14/2022   GLUCOSE 91 04/14/2022   CHOL 98 04/02/2022   TRIG 81 04/11/2022   HDL 46 04/02/2022   LDLCALC 43 04/02/2022   ALT 41 04/09/2022   AST  36 04/09/2022   NA 139 04/14/2022   K 3.2 (L) 04/14/2022   CL 107 04/14/2022   CREATININE 0.83 04/14/2022   BUN 8 04/14/2022   CO2 23 04/14/2022   TSH 0.774 08/24/2021   INR 1.0 04/09/2022   HGBA1C 5.7 (H) 03/06/2022      Assessment & Plan:    Mixed hyperlipidemia Assessment & Plan: Controlled Denies any side effects or issues taking the medication Continue to take Lipitor 80mg  as directed Will adjust treatment dependent on labs  Orders: -     Atorvastatin Calcium; Take 1 tablet (80 mg total) by mouth daily.  Dispense: 30 tablet; Refill: 2 -     CBC with Differential/Platelet; Future -     Comprehensive metabolic panel; Future -     Lipid panel; Future  Other specified hypothyroidism Assessment & Plan:  Controlled Labs drawn Currently taking Will adjust treatment as needed depending on results  Orders: -     T4, free; Future -     TSH; Future  Abscess of back Assessment & Plan: Healing very well Continue to monitor for discharge or regrowth of abscess Continue to put neosporin in the wound that is not fully healed to th epidermis.       Meds ordered this encounter  Medications   atorvastatin (LIPITOR) 80 MG tablet    Sig: Take 1 tablet (80 mg total) by mouth daily.    Dispense:  30 tablet    Refill:  2    Orders Placed This Encounter  Procedures   CBC with Differential/Platelet   Comprehensive metabolic panel   Lipid panel   T4, free   TSH     Follow-up: Return in about 4 months (around 12/23/2022) for Chronic, fasting, Huston Foley.   I,Jacqua L Marsh,acting as a scribe for [employer], PA.,have documented all relevant documentation on the behalf of Langley Gauss, PA,as directed by  Langley Gauss, PA while in the presence of Langley Gauss, [place].   An After Visit Summary was printed and given to the patient.  Langley Gauss, [place] [facility] [phone]

## 2022-08-22 NOTE — Assessment & Plan Note (Addendum)
Healing very well Continue to monitor for discharge or regrowth of abscess Continue to put neosporin in the wound that is not fully healed to th epidermis.

## 2022-08-23 ENCOUNTER — Other Ambulatory Visit: Payer: Medicare PPO

## 2022-08-24 ENCOUNTER — Other Ambulatory Visit: Payer: Medicare PPO

## 2022-08-24 DIAGNOSIS — E782 Mixed hyperlipidemia: Secondary | ICD-10-CM

## 2022-08-24 DIAGNOSIS — E038 Other specified hypothyroidism: Secondary | ICD-10-CM

## 2022-08-25 LAB — CBC WITH DIFFERENTIAL/PLATELET
Basophils Absolute: 0.1 10*3/uL (ref 0.0–0.2)
Basos: 1 %
EOS (ABSOLUTE): 0.2 10*3/uL (ref 0.0–0.4)
Eos: 4 %
Hematocrit: 42 % (ref 37.5–51.0)
Hemoglobin: 13.2 g/dL (ref 13.0–17.7)
Immature Grans (Abs): 0 10*3/uL (ref 0.0–0.1)
Immature Granulocytes: 0 %
Lymphocytes Absolute: 0.9 10*3/uL (ref 0.7–3.1)
Lymphs: 18 %
MCH: 28.6 pg (ref 26.6–33.0)
MCHC: 31.4 g/dL — ABNORMAL LOW (ref 31.5–35.7)
MCV: 91 fL (ref 79–97)
Monocytes Absolute: 0.5 10*3/uL (ref 0.1–0.9)
Monocytes: 10 %
Neutrophils Absolute: 3.5 10*3/uL (ref 1.4–7.0)
Neutrophils: 67 %
Platelets: 298 10*3/uL (ref 150–450)
RBC: 4.62 x10E6/uL (ref 4.14–5.80)
RDW: 12.9 % (ref 11.6–15.4)
WBC: 5.2 10*3/uL (ref 3.4–10.8)

## 2022-08-25 LAB — COMPREHENSIVE METABOLIC PANEL
ALT: 29 IU/L (ref 0–44)
AST: 25 IU/L (ref 0–40)
Albumin: 4.1 g/dL (ref 3.8–4.8)
Alkaline Phosphatase: 117 IU/L (ref 44–121)
BUN/Creatinine Ratio: 14 (ref 10–24)
BUN: 12 mg/dL (ref 8–27)
Bilirubin Total: 0.5 mg/dL (ref 0.0–1.2)
CO2: 26 mmol/L (ref 20–29)
Calcium: 9.8 mg/dL (ref 8.6–10.2)
Chloride: 102 mmol/L (ref 96–106)
Creatinine, Ser: 0.84 mg/dL (ref 0.76–1.27)
Globulin, Total: 2.3 g/dL (ref 1.5–4.5)
Glucose: 89 mg/dL (ref 70–99)
Potassium: 5.1 mmol/L (ref 3.5–5.2)
Sodium: 142 mmol/L (ref 134–144)
Total Protein: 6.4 g/dL (ref 6.0–8.5)
eGFR: 91 mL/min/{1.73_m2} (ref >59)

## 2022-08-25 LAB — LIPID PANEL
Chol/HDL Ratio: 2.1 ratio (ref 0.0–5.0)
Cholesterol, Total: 105 mg/dL (ref 100–199)
HDL: 51 mg/dL (ref >39)
LDL Chol Calc (NIH): 39 mg/dL (ref 0–99)
Triglycerides: 68 mg/dL (ref 0–149)
VLDL Cholesterol Cal: 15 mg/dL (ref 5–40)

## 2022-08-25 LAB — T4, FREE: Free T4: 1.57 ng/dL (ref 0.82–1.77)

## 2022-08-25 LAB — TSH: TSH: 0.048 u[IU]/mL — ABNORMAL LOW (ref 0.450–4.500)

## 2022-08-29 ENCOUNTER — Other Ambulatory Visit: Payer: Self-pay | Admitting: Physician Assistant

## 2022-08-29 DIAGNOSIS — E038 Other specified hypothyroidism: Secondary | ICD-10-CM

## 2022-08-29 MED ORDER — LEVOTHYROXINE SODIUM 100 MCG PO TABS
100.0000 ug | ORAL_TABLET | Freq: Every day | ORAL | 1 refills | Status: DC
Start: 2022-08-29 — End: 2023-01-18

## 2022-08-30 ENCOUNTER — Ambulatory Visit (INDEPENDENT_AMBULATORY_CARE_PROVIDER_SITE_OTHER): Payer: Medicare PPO

## 2022-08-30 VITALS — Wt 153.0 lb

## 2022-08-30 DIAGNOSIS — Z0000 Encounter for general adult medical examination without abnormal findings: Secondary | ICD-10-CM

## 2022-08-30 NOTE — Patient Instructions (Signed)
James Richards , Thank you for taking time to come for your Medicare Wellness Visit. I appreciate your ongoing commitment to your health goals. Please review the following plan we discussed and let me know if I can assist you in the future.   These are the goals we discussed:  Goals      Increase physical activity        This is a list of the screening recommended for you and due dates:  Health Maintenance  Topic Date Due   Hepatitis C Screening  Never done   DTaP/Tdap/Td vaccine (1 - Tdap) Never done   Zoster (Shingles) Vaccine (1 of 2) 12/10/1965   COVID-19 Vaccine (3 - Moderna risk series) 07/14/2020   Flu Shot  09/15/2022   Medicare Annual Wellness Visit  08/30/2023   Pneumonia Vaccine  Completed   HPV Vaccine  Aged Out   Screening for Lung Cancer  Discontinued   Colon Cancer Screening  Discontinued     Preventive Care 65 Years and Older, Male  Preventive care refers to lifestyle choices and visits with your health care provider that can promote health and wellness. What does preventive care include? A yearly physical exam. This is also called an annual well check. Dental exams once or twice a year. Routine eye exams. Ask your health care provider how often you should have your eyes checked. Personal lifestyle choices, including: Daily care of your teeth and gums. Regular physical activity. Eating a healthy diet. Avoiding tobacco and drug use. Limiting alcohol use. Practicing safe sex. Taking low doses of aspirin every day. Taking vitamin and mineral supplements as recommended by your health care provider. What happens during an annual well check? The services and screenings done by your health care provider during your annual well check will depend on your age, overall health, lifestyle risk factors, and family history of disease. Counseling  Your health care provider may ask you questions about your: Alcohol use. Tobacco use. Drug use. Emotional well-being. Home and  relationship well-being. Sexual activity. Eating habits. History of falls. Memory and ability to understand (cognition). Work and work Astronomer. Screening  You may have the following tests or measurements: Height, weight, and BMI. Blood pressure. Lipid and cholesterol levels. These may be checked every 5 years, or more frequently if you are over 73 years old. Skin check. Lung cancer screening. You may have this screening every year starting at age 53 if you have a 30-pack-year history of smoking and currently smoke or have quit within the past 15 years. Fecal occult blood test (FOBT) of the stool. You may have this test every year starting at age 36. Flexible sigmoidoscopy or colonoscopy. You may have a sigmoidoscopy every 5 years or a colonoscopy every 10 years starting at age 72. Prostate cancer screening. Recommendations will vary depending on your family history and other risks. Hepatitis C blood test. Hepatitis B blood test. Sexually transmitted disease (STD) testing. Diabetes screening. This is done by checking your blood sugar (glucose) after you have not eaten for a while (fasting). You may have this done every 1-3 years. Abdominal aortic aneurysm (AAA) screening. You may need this if you are a current or former smoker. Osteoporosis. You may be screened starting at age 24 if you are at high risk. Talk with your health care provider about your test results, treatment options, and if necessary, the need for more tests. Vaccines  Your health care provider may recommend certain vaccines, such as: Influenza vaccine. This is  recommended every year. Tetanus, diphtheria, and acellular pertussis (Tdap, Td) vaccine. You may need a Td booster every 10 years. Zoster vaccine. You may need this after age 18. Pneumococcal 13-valent conjugate (PCV13) vaccine. One dose is recommended after age 8. Pneumococcal polysaccharide (PPSV23) vaccine. One dose is recommended after age 33. Talk to your  health care provider about which screenings and vaccines you need and how often you need them. This information is not intended to replace advice given to you by your health care provider. Make sure you discuss any questions you have with your health care provider. Document Released: 02/27/2015 Document Revised: 10/21/2015 Document Reviewed: 12/02/2014 Elsevier Interactive Patient Education  2017 [employer].  Fall Prevention in the Home Falls can cause injuries. They can happen to people of all ages. There are many things you can do to make your home safe and to help prevent falls. What can I do on the outside of my home? Regularly fix the edges of walkways and driveways and fix any cracks. Remove anything that might make you trip as you walk through a door, such as a raised step or threshold. Trim any bushes or trees on the path to your home. Use bright outdoor lighting. Clear any walking paths of anything that might make someone trip, such as rocks or tools. Regularly check to see if handrails are loose or broken. Make sure that both sides of any steps have handrails. Any raised decks and porches should have guardrails on the edges. Have any leaves, snow, or ice cleared regularly. Use sand or salt on walking paths during winter. Clean up any spills in your garage right away. This includes oil or grease spills. What can I do in the bathroom? Use night lights. Install grab bars by the toilet and in the tub and shower. Do not use towel bars as grab bars. Use non-skid mats or decals in the tub or shower. If you need to sit down in the shower, use a plastic, non-slip stool. Keep the floor dry. Clean up any water that spills on the floor as soon as it happens. Remove soap buildup in the tub or shower regularly. Attach bath mats securely with double-sided non-slip rug tape. Do not have throw rugs and other things on the floor that can make you trip. What can I do in the bedroom? Use night  lights. Make sure that you have a light by your bed that is easy to reach. Do not use any sheets or blankets that are too big for your bed. They should not hang down onto the floor. Have a firm chair that has side arms. You can use this for support while you get dressed. Do not have throw rugs and other things on the floor that can make you trip. What can I do in the kitchen? Clean up any spills right away. Avoid walking on wet floors. Keep items that you use a lot in easy-to-reach places. If you need to reach something above you, use a strong step stool that has a grab bar. Keep electrical cords out of the way. Do not use floor polish or wax that makes floors slippery. If you must use wax, use non-skid floor wax. Do not have throw rugs and other things on the floor that can make you trip. What can I do with my stairs? Do not leave any items on the stairs. Make sure that there are handrails on both sides of the stairs and use them. Fix handrails that are  broken or loose. Make sure that handrails are as long as the stairways. Check any carpeting to make sure that it is firmly attached to the stairs. Fix any carpet that is loose or worn. Avoid having throw rugs at the top or bottom of the stairs. If you do have throw rugs, attach them to the floor with carpet tape. Make sure that you have a light switch at the top of the stairs and the bottom of the stairs. If you do not have them, ask someone to add them for you. What else can I do to help prevent falls? Wear shoes that: Do not have high heels. Have rubber bottoms. Are comfortable and fit you well. Are closed at the toe. Do not wear sandals. If you use a stepladder: Make sure that it is fully opened. Do not climb a closed stepladder. Make sure that both sides of the stepladder are locked into place. Ask someone to hold it for you, if possible. Clearly mark and make sure that you can see: Any grab bars or handrails. First and last  steps. Where the edge of each step is. Use tools that help you move around (mobility aids) if they are needed. These include: Canes. Walkers. Scooters. Crutches. Turn on the lights when you go into a dark area. Replace any light bulbs as soon as they burn out. Set up your furniture so you have a clear path. Avoid moving your furniture around. If any of your floors are uneven, fix them. If there are any pets around you, be aware of where they are. Review your medicines with your doctor. Some medicines can make you feel dizzy. This can increase your chance of falling. Ask your doctor what other things that you can do to help prevent falls. This information is not intended to replace advice given to you by your health care provider. Make sure you discuss any questions you have with your health care provider. Document Released: 11/27/2008 Document Revised: 07/09/2015 Document Reviewed: 03/07/2014 Elsevier Interactive Patient Education  2017 [employer].

## 2022-08-30 NOTE — Progress Notes (Signed)
Subjective:   James Richards is a 77 y.o. male who presents for Medicare Annual/Subsequent preventive examination.  Visit Complete: Virtual  I connected with  James Richards on 08/30/22 by a audio enabled telemedicine application and verified that I am speaking with the correct person using two identifiers.  Also participating in the call is James Richards, which is [name]'s POA.  Patient Location: Home  Provider Location: Office/Clinic  I discussed the limitations of evaluation and management by telemedicine. The patient expressed understanding and agreed to proceed.  Cardiac Risk Factors include: advanced age (>44men, >64 women);sedentary lifestyle;male gender     Objective:    Today's Vitals   08/30/22 1223  Weight: 153 lb (69.4 kg)  PainSc: 0-No pain  BP: Patient was unable to self-report due to a lack of equipment at home via telehealth  Body mass index is 25.46 kg/m.     04/10/2022    3:04 PM 04/01/2022    5:24 PM 03/08/2022   12:00 PM 08/26/2021    2:15 PM 03/29/2021   12:06 PM 01/28/2020    9:51 AM 11/24/2014    6:08 AM  Advanced Directives  Does Patient Have a Medical Advance Directive? Yes No Yes Yes No Yes No  Type of Advance Directive Healthcare Power of Attorney  Living will;Healthcare Power of [employer] Power of [employer] Power of [place];Living will   Does patient want to make changes to medical advance directive? No - Guardian declined  No - Patient declined      Copy of Healthcare Power of Attorney in Chart? Yes - validated most recent copy scanned in chart (See row information)  No - copy requested No - copy requested  No - copy requested   Would patient like information on creating a medical advance directive? No - Guardian declined No - Patient declined   No - Patient declined  No - patient declined information    Current Medications (verified) Outpatient Encounter Medications as of 08/30/2022  Medication Sig   acetaminophen-codeine  (TYLENOL #3) 300-30 MG tablet Take 1 tablet by mouth every 6 (six) hours as needed for moderate pain. TAKE 1 TABLET EVERY 4 HOURS AS NEEDED FOR MODERATE PAIN Strength: 300-30 mg   albuterol (VENTOLIN HFA) 108 (90 Base) MCG/ACT inhaler Inhale 1 puff into the lungs as needed for wheezing or shortness of breath.   atorvastatin (LIPITOR) 80 MG tablet Take 1 tablet (80 mg total) by mouth daily.   ELIQUIS 5 MG TABS tablet Take 5 mg by mouth 2 (two) times daily.   furosemide (LASIX) 20 MG tablet Take 20 mg by mouth daily as needed.   furosemide (LASIX) 40 MG tablet Take 1 tablet (40 mg total) by mouth every other day. (Patient taking differently: Take 40 mg by mouth daily as needed for fluid or edema.)   haloperidol (HALDOL) 2 MG/ML solution Place 1 mL (2 mg total) under the tongue every 4 (four) hours as needed for agitation (or delirium).   hyoscyamine (LEVSIN SL) 0.125 MG SL tablet Place under the tongue.   ipratropium-albuterol (DUONEB) 0.5-2.5 (3) MG/3ML SOLN Take 3 mLs by nebulization every 4 (four) hours as needed.   levothyroxine (SYNTHROID) 100 MCG tablet Take 1 tablet (100 mcg total) by mouth daily.   LORazepam (ATIVAN) 2 MG/ML concentrated solution Place 0.5 mLs (1 mg total) under the tongue every 4 (four) hours as needed for anxiety.   melatonin 3 MG TABS tablet Take 3 mg by mouth at  bedtime as needed.   metoprolol tartrate (LOPRESSOR) 25 MG tablet Take 1/2 tablet (12.5 mg total) by mouth 2 (two) times daily.   Naphazoline HCl (CLEAR EYES OP) Place 1-2 drops into both eyes as needed (dry eye).   Omega-3 Fatty Acids (FISH OIL) 1200 MG CPDR Take 1 capsule by mouth daily.   pantoprazole (PROTONIX) 40 MG tablet Take 1 tablet (40 mg total) by mouth daily.   Potassium Bicarbonate 99 MG CAPS Take 99 mg by mouth daily.   rOPINIRole (REQUIP) 2 MG tablet Take 2 mg by mouth at bedtime.   SYMBICORT 160-4.5 MCG/ACT inhaler Inhale 2 puffs into the lungs daily.   No facility-administered encounter  medications on file as of 08/30/2022.    Allergies (verified) Patient has no known allergies.   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   History of recurrent TIAs 12/11/2014   Impacted cerumen 11/26/2019   Malignant neoplasm of base of tongue (HCC) 01/21/2015   Malignant neoplasm of larynx, unspecified (HCC)    Migraine 04/02/2014   Mixed hyperlipidemia    Other fatigue    Other specified hypothyroidism    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    Restless legs syndrome    Restrictive lung disease 11/26/2019   TIA (transient ischemic attack) 11/24/2014   Vision exam with abnormal findings concern for left retinal artery occlusion 03/29/2021   Past Surgical History:  Procedure Laterality Date   IR CT HEAD LTD  03/06/2022   IR PERCUTANEOUS ART THROMBECTOMY/INFUSION INTRACRANIAL INC DIAG ANGIO  03/06/2022   LOOP RECORDER INSERTION N/A 04/04/2022   Procedure: LOOP RECORDER INSERTION;  Surgeon: Lanier Prude, MD;  Location: [facility] INVASIVE CV LAB;  Service: Cardiovascular;  Laterality: N/A;   LYMPHADENECTOMY     PERIPHERAL VASCULAR CATHETERIZATION N/A 11/24/2014   Procedure: Aortic Arch Angiography;  Surgeon: Chuck Hint, MD;  Location: [facility] INVASIVE CV LAB;  Service: Cardiovascular;  Laterality: N/A;   PERIPHERAL VASCULAR CATHETERIZATION N/A 11/24/2014   Procedure: Cerebral Angiography;  Surgeon: Chuck Hint, MD;  Location: [facility] INVASIVE CV LAB;  Service: Cardiovascular;  Laterality: N/A;   RADIOLOGY WITH ANESTHESIA N/A 03/06/2022   Procedure: RADIOLOGY WITH ANESTHESIA;  Surgeon:  Radiologist, Medication, MD;  Location: [facility] OR;  Service: Radiology;  Laterality: N/A;   THROAT SURGERY     TONSILECTOMY/ADENOIDECTOMY WITH MYRINGOTOMY     Family History  Problem Relation Age of Onset   Other Father        amputation   Deep vein thrombosis Father    Heart disease Father        Atrial Fib.   Cancer Sister    Hypertension Brother    Social History   Socioeconomic History   Marital status: Divorced    Spouse name: Not on file   Number of children: Not on file   Years of education: Not on file   Highest education level: Not on file  Occupational History   Not on file  Tobacco Use   Smoking status: Former    Current packs/day: 0.00    Average packs/day: 1 pack/day for 41.0 years (41.0 ttl pk-yrs)    Types: Cigarettes    Start date:  02/14/1966    Quit date: 02/15/2007    Years since quitting: 15.5   Smokeless tobacco: Former    Types: Associate Professor status: Never Used  Substance and Sexual Activity   Alcohol use: No    Alcohol/week: 0.0 standard drinks of alcohol   Drug use: No   Sexual activity: Not Currently    Partners: Female  Other Topics Concern   Not on file  Social History Narrative   Not on file   Social Determinants of Health   Financial Resource Strain: Low Risk  (08/26/2021)   Overall Financial Resource Strain (CARDIA)    Difficulty of Paying Living Expenses: Not hard at all  Food Insecurity: No Food Insecurity (04/05/2022)   Hunger Vital Sign    Worried About Running Out of Food in the Last Year: Never true    Ran Out of Food in the Last Year: Never true  Transportation Needs: No Transportation Needs (04/05/2022)   PRAPARE - Administrator, Civil Service (Medical): No    Lack of Transportation (Non-Medical): No  Physical Activity: Inactive (08/26/2021)   Exercise Vital Sign    Days of Exercise per Week: 0 days    Minutes of Exercise per Session: 0 min  Stress: No Stress Concern Present (08/26/2021)   [employer] of Occupational Health - Occupational Stress Questionnaire    Feeling of Stress : Not at all  Social Connections: Moderately Isolated (08/26/2021)   Social Connection and Isolation Panel [NHANES]    Frequency of Communication with Friends and Family: More than three times a week    Frequency of Social Gatherings with Friends and Family: More than three times a week    Attends Religious Services: More than 4 times per year    Active Member of [employer] or Organizations: No    Attends Engineer, structural: Never    Marital Status: Divorced    Tobacco Counseling Counseling given: Not Answered   Clinical Intake:  Pre-visit preparation completed: Yes Pain : No/denies pain Pain Score: 0-No pain   BMI - recorded: 25.46 Nutritional Status: BMI 25 -29 Overweight Nutritional Risks: None Diabetes: No How often do you need to have someone help you when you read instructions, pamphlets, or other written materials from your doctor or pharmacy?: 5 - Always Interpreter Needed?: No     Activities of Daily Living    08/30/2022   11:13 AM 03/08/2022   12:00 PM  In your present state of health, do you have any difficulty performing the following activities:  Hearing? 1 1  Vision? 1 0  Difficulty concentrating or making decisions? 1 0  Walking or climbing stairs? 0 0  Dressing or bathing? 1 1  Doing errands, shopping? 1 0  Preparing Food and eating ? Y   Using the Toilet? N   In the past six months, have you accidently leaked urine? N   Do you have problems with loss of bowel control? N   Managing your Medications? Y   Managing your Finances? Y   Housekeeping or managing your Housekeeping? Y     Patient Care Team: Langley Gauss, [place] as PCP - General (Physician Assistant)     Assessment:   This is a routine wellness examination for Antonie.  Hearing/Vision screen No results found.  Dietary issues and exercise activities discussed: Aim for 30 minutes of exercise or  brisk walking, 6-8 glasses of water, and 5 servings of fruits and vegetables each  day.    Depression Screen    08/30/2022   12:32 PM 08/22/2022   10:58 AM 06/20/2022    2:31 PM 04/05/2022   11:33 AM 12/27/2021   11:10 AM 08/26/2021    2:14 PM 04/07/2021    3:01 PM  PHQ 2/9 Scores  PHQ - 2 Score 0 0 5 1 0 0 0  PHQ- 9 Score 2 2 13   0      Fall Risk    08/30/2022   12:31 PM 08/22/2022   10:58 AM 06/20/2022    2:30 PM 12/27/2021   11:09 AM 08/26/2021    2:15 PM  Fall Risk   Falls in the past year? 0  0 0 0  Number falls in past yr: 0 0 0 0 0  Injury with Fall? 0 0 0 0 0  Risk for fall due to : Impaired vision No Fall Risks Impaired mobility;Mental status change;History of fall(s) No Fall Risks No Fall Risks  Follow up Falls evaluation completed;Education provided Falls evaluation completed Falls evaluation completed Falls evaluation completed Falls evaluation completed;Education provided;Falls prevention discussed    MEDICARE RISK AT HOME:  Medicare Risk at Home - 08/30/22 1231     Any stairs in or around the home? Yes    If so, are there any without handrails? No    Home free of loose throw rugs in walkways, pet beds, electrical cords, etc? Yes    Adequate lighting in your home to reduce risk of falls? Yes    Life alert? No    Use of a cane, walker or w/c? No    Grab bars in the bathroom? Yes    Shower chair or bench in shower? Yes    Elevated toilet seat or a handicapped toilet? No             TIMED UP AND GO:  Was the test performed?  No    Cognitive Function:        08/26/2021    2:17 PM 01/28/2020    9:53 AM  6CIT Screen  What Year? 0 points 0 points  What month? 0 points 0 points  What time? 0 points 0 points  Count back from 20 0 points 0 points  Months in reverse 2 points 2 points  Repeat phrase 0 points 0 points  Total Score 2 points 2 points    Immunizations Immunization History  Administered Date(s) Administered   Fluad Quad(high Dose 65+)  11/14/2019, 12/16/2020, 12/27/2021   Influenza-Unspecified 11/20/2017, 11/02/2018   Moderna SARS-COV2 Booster Vaccination 12/31/2019, 06/16/2020   Moderna Sars-Covid-2 Vaccination 04/11/2019, 05/13/2019   Pneumococcal Conjugate-13 11/11/2013   Pneumococcal Polysaccharide-23 12/08/2016   Zoster, Live 06/12/2013    TDAP status: Due, Education has been provided regarding the importance of this vaccine. Advised may receive this vaccine at local pharmacy or Health Dept. Aware to provide a copy of the vaccination record if obtained from local pharmacy or Health Dept. Verbalized acceptance and understanding.  Flu Vaccine status: Up to date  Pneumococcal vaccine status: Up to date  Covid-19 vaccine status: Completed vaccines  Qualifies for Shingles Vaccine? Yes   Zostavax completed Yes   Shingrix Completed?: No.    Education has been provided regarding the importance of this vaccine. Patient has been advised to call insurance company to determine out of pocket expense if they have not yet received this vaccine. Advised may also receive vaccine at local pharmacy or Health Dept. Verbalized acceptance and understanding.  Screening Tests  Health Maintenance  Topic Date Due   Hepatitis C Screening  Never done   DTaP/Tdap/Td (1 - Tdap) Never done   Zoster Vaccines- Shingrix (1 of 2) [birth year]10/1964   COVID-19 Vaccine (3 - Moderna risk series) 07/14/2020   Colonoscopy  04/26/2021   INFLUENZA VACCINE  09/15/2022   Medicare Annual Wellness (AWV)  08/30/2023   Pneumonia Vaccine 57+ Years old  Completed   HPV VACCINES  Aged Out   Lung Cancer Screening  Discontinued    Health Maintenance  Health Maintenance Due  Topic Date Due   Hepatitis C Screening  Never done   DTaP/Tdap/Td (1 - Tdap) Never done   Zoster Vaccines- Shingrix (1 of 2) 12/10/1965   COVID-19 Vaccine (3 - Moderna risk series) 07/14/2020   Colonoscopy  04/26/2021    Colorectal cancer screening: No longer required.   Lung Cancer  Screening: (Low Dose CT Chest recommended if Age 32-80 years, 20 pack-year currently smoking OR have quit w/in 15years.) does not qualify.   Lung Cancer Screening Referral: N/A  Additional Screening:  Vision Screening: Recommended annual ophthalmology exams for early detection of glaucoma and other disorders of the eye. Is the patient up to date with their annual eye exam?  No   Dental Screening: Recommended annual dental exams for proper oral hygiene   Community Resource Referral / Chronic Care Management: CRR required this visit?  No   CCM required this visit?  No     Plan:    1- Patient is under home hospice care with [facility] (since March 2024)  2- Chair exercises recommended as tolerated  I have personally reviewed and noted the following in the patient's chart:   Medical and social history Use of alcohol, tobacco or illicit drugs  Current medications and supplements including opioid prescriptions.  Functional ability and status Nutritional status Physical activity Advanced directives List of other physicians Hospitalizations, surgeries, and ER visits in previous 12 months Vitals (Historical) Screenings to include cognitive, depression, and falls Referrals and appointments  In addition, I have reviewed and discussed with patient certain preventive protocols, quality metrics, and best practice recommendations. A written personalized care plan for preventive services as well as general preventive health recommendations were provided to patient.     DRAE MITZEL, LPN   1/61/0960   After Visit Summary: (MyChart) Due to this being a telephonic visit, the after visit summary with patients personalized plan was offered to patient via MyChart

## 2022-09-15 ENCOUNTER — Other Ambulatory Visit: Payer: Self-pay

## 2022-09-15 ENCOUNTER — Telehealth: Payer: Self-pay | Admitting: Physician Assistant

## 2022-09-15 MED ORDER — ROPINIROLE HCL 2 MG PO TABS: 2.0000 mg | ORAL_TABLET | Freq: Every day | ORAL | 1 refills | Status: DC

## 2022-09-15 NOTE — Telephone Encounter (Signed)
Prescription Request  09/15/2022  LOV: 08/22/2022  What is the name of the medication or equipment?  rOPINIRole (REQUIP) 2 MG tablet [[id number]]     Which pharmacy would you like this sent to?    [facility] [phone] - [place], [place] - [address] AT [facility] [address] [place] [address] Phone: [phone] Fax: [phone]      Patient notified that their request is being sent to the clinical staff for review and that they should receive a response within 2 business days.   Please advise at Mobile [phone] (mobile)

## 2022-09-27 ENCOUNTER — Other Ambulatory Visit: Payer: Medicare PPO

## 2022-10-06 ENCOUNTER — Other Ambulatory Visit: Payer: Medicare PPO

## 2022-10-06 DIAGNOSIS — E038 Other specified hypothyroidism: Secondary | ICD-10-CM

## 2022-10-07 LAB — TSH: TSH: 0.357 u[IU]/mL — ABNORMAL LOW (ref 0.450–4.500)

## 2022-10-07 LAB — T4, FREE: Free T4: 1.52 ng/dL (ref 0.82–1.77)

## 2022-11-07 ENCOUNTER — Telehealth: Payer: Self-pay

## 2022-11-07 NOTE — Telephone Encounter (Signed)
[facility] and stated patient needed a refill on levothyroxine, but she called and order it and wanted to make sure he was on the 100 mcg still.  Called back to verify medication but was unable to get anyone, phone just rung.

## 2022-11-23 IMAGING — DX DG CHEST 1V PORT
1 series · 1 of 1 positions shown · non-contrast
Comparison: Chest radiograph 12/02/2019

CLINICAL DATA: History of chronic diastolic heart failure, lung
disease, presents with left vision changes

EXAM:
PORTABLE CHEST 1 VIEW

[chest ap]
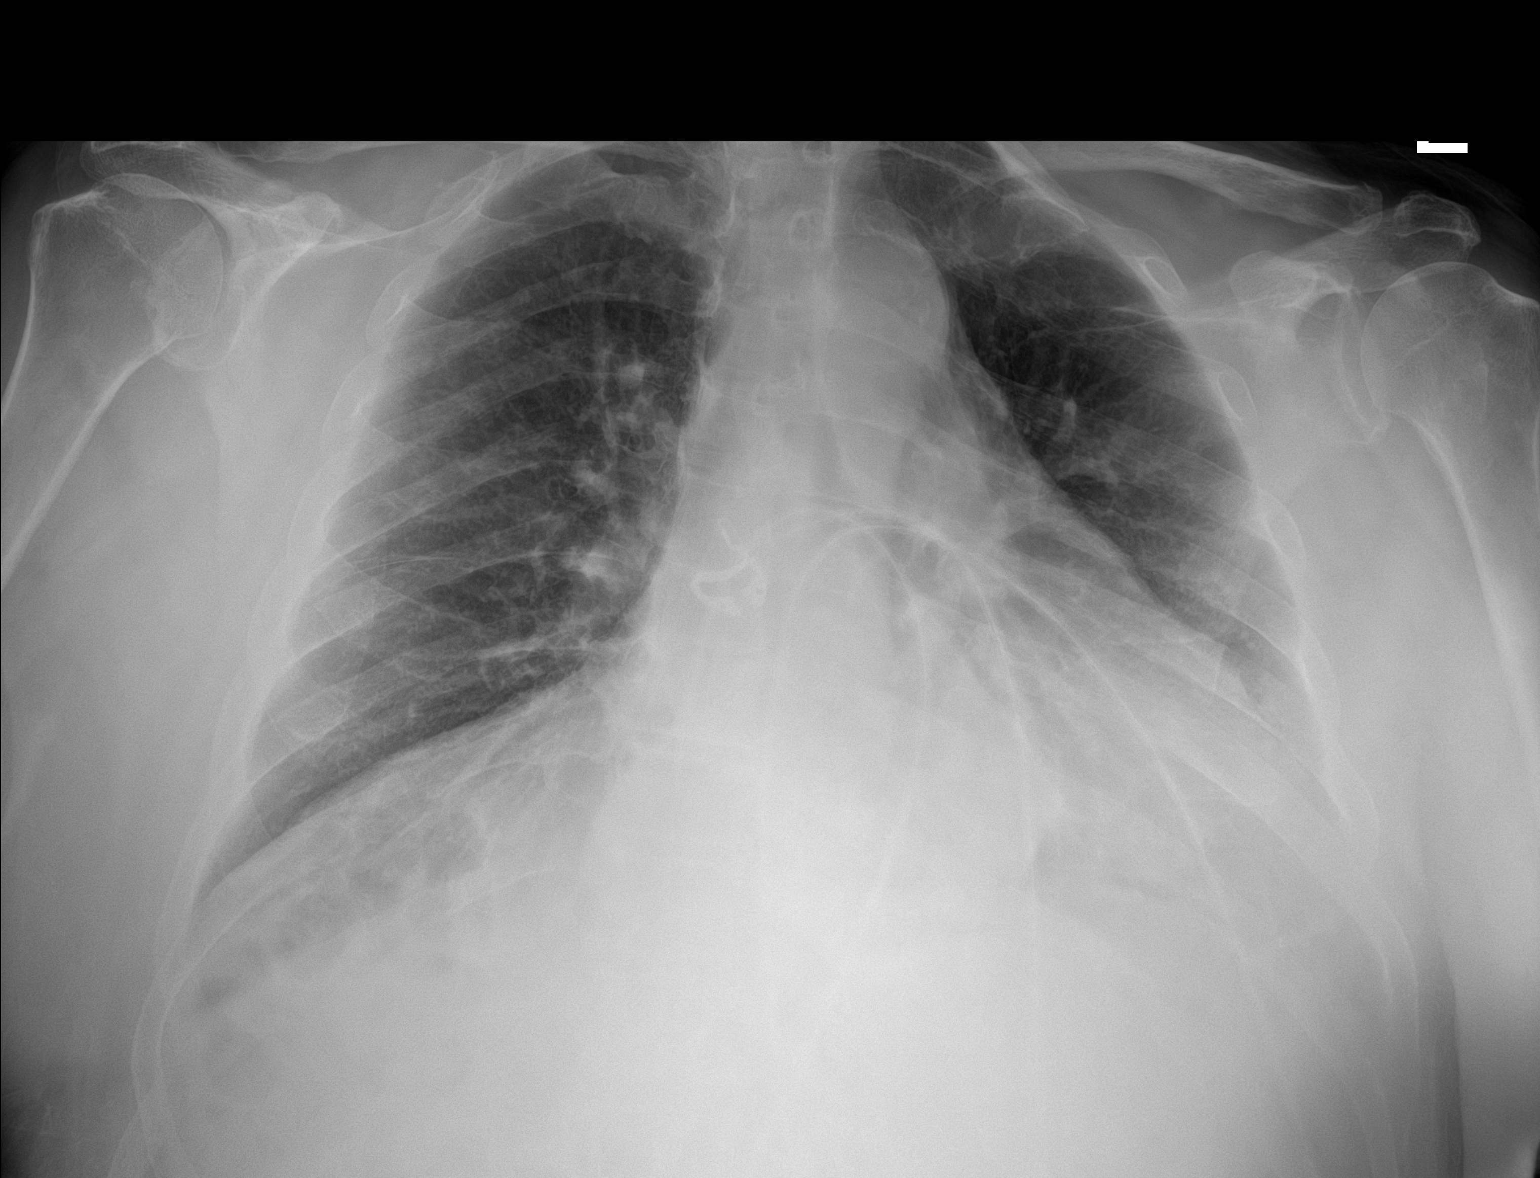

[1 of 1 positions shown; findings below may reference images not displayed]

FINDINGS: The heart is enlarged, unchanged. The upper mediastinal contours are
within normal limits.

There are patchy retrocardiac opacities. There is no other focal
consolidation or pulmonary edema. There is no pleural effusion or
pneumothorax. There is a moderate-to-large hiatal hernia.

There is no acute osseous abnormality.
IMPRESSION: 1. Retrocardiac opacities may reflect atelectasis, infection, or
aspiration. Consider PA and lateral chest radiographs for better
evaluation.
2. Cardiomegaly.
3. Moderate to large hiatal hernia.

## 2022-12-20 ENCOUNTER — Ambulatory Visit (INDEPENDENT_AMBULATORY_CARE_PROVIDER_SITE_OTHER)

## 2022-12-20 DIAGNOSIS — Z23 Encounter for immunization: Secondary | ICD-10-CM

## 2022-12-26 NOTE — Progress Notes (Unsigned)
Subjective:  Patient ID: James Richards, male    DOB: [birth year]  Age: 77 y.o. MRN: [id number]  No chief complaint on file.   HPI   Patient presents for follow-up of HTN, Hypothyroidism, and GERD. He has idiopathic pulmonary fibrosis with chronic hypoxia, O2 dependence at night. Treatment includes Albuterol and Symbicort.   Hypothyroidism: Levothyroxine 125 mcg daily  Hyperlipidemia: Lipitor 80 mg daily, Fish oil 1000 mg daily  Hypertension: Complications: Heart Failure Current medications: metoprolol Tartrate 25 mg 0.5 tablet twice daily., Furosemide 40 mg e/o day, Eliquis 5 mg twice daily. Cardiologist monitors medications.  GERD: Protonix 40 mg daily  Anxiety: Lorazepam 2 mg as needed  Restless leg: Ropinirole 2 mg daily.     08/30/2022   12:32 PM 08/22/2022   10:58 AM 06/20/2022    2:31 PM 04/05/2022   11:33 AM 12/27/2021   11:10 AM  Depression screen PHQ 2/9  Decreased Interest 0 0 3 0 0  Down, Depressed, Hopeless 0 0 2 1 0  PHQ - 2 Score 0 0 5 1 0  Altered sleeping 1 1 2   0  Tired, decreased energy 1 1 2   0  Change in appetite 0 0 0  0  Feeling bad or failure about yourself  0 0 0  0  Trouble concentrating 0 0 2  0  Moving slowly or fidgety/restless 0 0 2  0  Suicidal thoughts 0 0 0  0  PHQ-9 Score 2 2 13   0  Difficult doing work/chores Not difficult at all Not difficult at all Extremely dIfficult Not difficult at all Not difficult at all        08/30/2022   12:31 PM  Fall Risk   Falls in the past year? 0  Number falls in past yr: 0  Injury with Fall? 0  Risk for fall due to : Impaired vision  Follow up Falls evaluation completed;Education provided    Patient Care Team: Langley Gauss, [place] as PCP - General (Physician Assistant)   Review of Systems  Current Outpatient Medications on File Prior to Visit  Medication Sig Dispense Refill   acetaminophen-codeine (TYLENOL #3) 300-30 MG tablet Take 1 tablet by mouth every 6 (six) hours as needed for moderate pain.  TAKE 1 TABLET EVERY 4 HOURS AS NEEDED FOR MODERATE PAIN Strength: 300-30 mg 18 tablet 0   albuterol (VENTOLIN HFA) 108 (90 Base) MCG/ACT inhaler Inhale 1 puff into the lungs as needed for wheezing or shortness of breath.     atorvastatin (LIPITOR) 80 MG tablet Take 1 tablet (80 mg total) by mouth daily. 30 tablet 2   ELIQUIS 5 MG TABS tablet Take 5 mg by mouth 2 (two) times daily.     furosemide (LASIX) 20 MG tablet Take 20 mg by mouth daily as needed.     furosemide (LASIX) 40 MG tablet Take 1 tablet (40 mg total) by mouth every other day. (Patient taking differently: Take 40 mg by mouth daily as needed for fluid or edema.) 90 tablet 2   haloperidol (HALDOL) 2 MG/ML solution Place 1 mL (2 mg total) under the tongue every 4 (four) hours as needed for agitation (or delirium). 15 mL 0   hyoscyamine (LEVSIN SL) 0.125 MG SL tablet Place under the tongue.     ipratropium-albuterol (DUONEB) 0.5-2.5 (3) MG/3ML SOLN Take 3 mLs by nebulization every 4 (four) hours as needed.     levothyroxine (SYNTHROID) 100 MCG tablet Take 1 tablet (100 mcg total) by  mouth daily. 30 tablet 1   LORazepam (ATIVAN) 2 MG/ML concentrated solution Place 0.5 mLs (1 mg total) under the tongue every 4 (four) hours as needed for anxiety. 30 mL 0   melatonin 3 MG TABS tablet Take 3 mg by mouth at bedtime as needed.     metoprolol tartrate (LOPRESSOR) 25 MG tablet Take 1/2 tablet (12.5 mg total) by mouth 2 (two) times daily. 30 tablet 2   Naphazoline HCl (CLEAR EYES OP) Place 1-2 drops into both eyes as needed (dry eye).     Omega-3 Fatty Acids (FISH OIL) 1200 MG CPDR Take 1 capsule by mouth daily.     pantoprazole (PROTONIX) 40 MG tablet Take 1 tablet (40 mg total) by mouth daily. 90 tablet 3   Potassium Bicarbonate 99 MG CAPS Take 99 mg by mouth daily.     rOPINIRole (REQUIP) 2 MG tablet Take 1 tablet (2 mg total) by mouth at bedtime. 90 tablet 1   SYMBICORT 160-4.5 MCG/ACT inhaler Inhale 2 puffs into the lungs daily.     No  current facility-administered medications on file prior to visit.   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   History of recurrent TIAs 12/11/2014   Impacted cerumen 11/26/2019   Malignant neoplasm of base of tongue (HCC) 01/21/2015   Malignant neoplasm of larynx, unspecified (HCC)    Migraine 04/02/2014   Mixed hyperlipidemia    Other fatigue    Other specified hypothyroidism    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    Restless legs syndrome    Restrictive lung disease 11/26/2019   TIA (transient ischemic attack) 11/24/2014   Vision exam with abnormal findings concern for left retinal artery occlusion 03/29/2021   Past Surgical History:  Procedure Laterality Date   IR CT HEAD LTD  03/06/2022   IR PERCUTANEOUS ART THROMBECTOMY/INFUSION INTRACRANIAL INC DIAG ANGIO  03/06/2022   LOOP RECORDER INSERTION N/A 04/04/2022   Procedure: LOOP RECORDER INSERTION;  Surgeon: Lanier Prude, MD;  Location: [facility] INVASIVE CV LAB;  Service: Cardiovascular;  Laterality: N/A;   LYMPHADENECTOMY     PERIPHERAL VASCULAR CATHETERIZATION N/A 11/24/2014   Procedure: Aortic Arch Angiography;  Surgeon: Chuck Hint, MD;  Location: [facility] INVASIVE CV LAB;  Service: Cardiovascular;  Laterality: N/A;   PERIPHERAL VASCULAR CATHETERIZATION N/A 11/24/2014   Procedure: Cerebral Angiography;  Surgeon: Chuck Hint, MD;  Location: [facility] INVASIVE CV LAB;  Service: Cardiovascular;  Laterality: N/A;   RADIOLOGY WITH ANESTHESIA N/A 03/06/2022   Procedure: RADIOLOGY WITH ANESTHESIA;  Surgeon: Radiologist, Medication, MD;  Location: [facility]  OR;  Service: Radiology;  Laterality: N/A;   THROAT SURGERY     TONSILECTOMY/ADENOIDECTOMY WITH MYRINGOTOMY      Family History  Problem Relation Age of Onset   Other Father        amputation   Deep vein thrombosis Father    Heart disease Father        Atrial Fib.   Cancer Sister    Hypertension Brother    Social History   Socioeconomic History   Marital status: Divorced    Spouse name: Not on file   Number of children: Not on file   Years  of education: Not on file   Highest education level: Not on file  Occupational History   Not on file  Tobacco Use   Smoking status: Former    Current packs/day: 0.00    Average packs/day: 1 pack/day for 41.0 years (41.0 ttl pk-yrs)    Types: Cigarettes    Start date: 02/14/1966    Quit date: 02/15/2007    Years since quitting: 15.8   Smokeless tobacco: Former    Types: Associate Professor status: Never Used  Substance and Sexual Activity   Alcohol use: No    Alcohol/week: 0.0 standard drinks of alcohol   Drug use: No   Sexual activity: Not Currently    Partners: Female  Other Topics Concern   Not on file  Social History Narrative   Not on file   Social Determinants of Health   Financial Resource Strain: Low Risk  (08/26/2021)   Overall Financial Resource Strain (CARDIA)    Difficulty of Paying Living Expenses: Not hard at all  Food Insecurity: No Food Insecurity (04/05/2022)   Hunger Vital Sign    Worried About Running Out of Food in the Last Year: Never true    Ran Out of Food in the Last Year: Never true  Transportation Needs: No Transportation Needs (04/05/2022)   PRAPARE - Administrator, Civil Service (Medical): No    Lack of Transportation (Non-Medical): No  Physical Activity: Inactive (08/26/2021)   Exercise Vital Sign    Days of Exercise per Week: 0 days    Minutes of Exercise per Session: 0 min  Stress: No Stress Concern Present (08/26/2021)   [employer] of Occupational Health - Occupational  Stress Questionnaire    Feeling of Stress : Not at all  Social Connections: Moderately Isolated (08/26/2021)   Social Connection and Isolation Panel [NHANES]    Frequency of Communication with Friends and Family: More than three times a week    Frequency of Social Gatherings with Friends and Family: More than three times a week    Attends Religious Services: More than 4 times per year    Active Member of [employer] or Organizations: No    Attends Banker Meetings: Never    Marital Status: Divorced    Objective:  There were no vitals taken for this visit.     08/30/2022   12:23 PM 08/22/2022   10:50 AM 07/19/2022    1:25 PM  BP/Weight  Systolic BP -- 110 140  Diastolic BP -- 66 70  Wt. (Lbs) 153 159.2 156  BMI 25.46 kg/m2 26.49 kg/m2 25.96 kg/m2    Physical Exam  Diabetic Foot Exam - Simple   No data filed      Lab Results  Component Value Date   WBC 5.2 08/24/2022   HGB 13.2 08/24/2022   HCT 42.0 08/24/2022   PLT 298 08/24/2022   GLUCOSE 89 08/24/2022   CHOL 105 08/24/2022   TRIG 68 08/24/2022   HDL 51 08/24/2022   LDLCALC 39 08/24/2022   ALT 29 08/24/2022   AST 25 08/24/2022   NA 142 08/24/2022   K 5.1 08/24/2022   CL 102 08/24/2022   CREATININE 0.84 08/24/2022   BUN 12 08/24/2022   CO2 26 08/24/2022   TSH 0.357 (L) 10/06/2022   INR 1.0 04/09/2022   HGBA1C 5.7 (H) 03/06/2022      Assessment & Plan:    Hypertensive heart disease with chronic diastolic congestive heart failure (HCC)  Other specified hypothyroidism  Mixed hyperlipidemia     No orders of the defined types were placed in this encounter.   No orders of the defined types were placed in this encounter.    Follow-up: No follow-ups on file.   I,Marla I Leal-Borjas,acting as a scribe for [employer], PA.,have documented all relevant documentation on the behalf of Langley Gauss, PA,as directed by  Langley Gauss, PA while in the presence of Langley Gauss, [place].   An After Visit Summary  was printed and given to the patient.  Langley Gauss, [place] [facility] [phone]

## 2022-12-27 ENCOUNTER — Encounter: Payer: Self-pay | Admitting: Physician Assistant

## 2022-12-27 ENCOUNTER — Ambulatory Visit (INDEPENDENT_AMBULATORY_CARE_PROVIDER_SITE_OTHER): Admitting: Physician Assistant

## 2022-12-27 VITALS — BP 108/64 | HR 52 | Temp 97.8°F | Ht 65.0 in | Wt 163.0 lb

## 2022-12-27 DIAGNOSIS — E782 Mixed hyperlipidemia: Secondary | ICD-10-CM | POA: Diagnosis not present

## 2022-12-27 DIAGNOSIS — E038 Other specified hypothyroidism: Secondary | ICD-10-CM | POA: Diagnosis not present

## 2022-12-27 DIAGNOSIS — R5382 Chronic fatigue, unspecified: Secondary | ICD-10-CM

## 2022-12-27 DIAGNOSIS — I110 Hypertensive heart disease with heart failure: Secondary | ICD-10-CM

## 2022-12-27 DIAGNOSIS — I5032 Chronic diastolic (congestive) heart failure: Secondary | ICD-10-CM | POA: Diagnosis not present

## 2022-12-27 NOTE — Assessment & Plan Note (Signed)
Well controlled.  Continue to work on eating a healthy diet and exercise.  Labs drawn today.   No major side effects reported, and no issues with compliance. The current medical regimen is effective;  continue present plan with lipitor 80mg  Will adjust medication as needed depending on labs Lab Results  Component Value Date   LDLCALC 39 08/24/2022

## 2022-12-27 NOTE — Assessment & Plan Note (Signed)
Labs drawn today Continue taking Synthroid as directed Will adjust treatment depending on labs

## 2022-12-27 NOTE — Addendum Note (Signed)
Addended by: Precious Reel on: 12/27/2022 01:43 PM   Modules accepted: Orders

## 2022-12-27 NOTE — Assessment & Plan Note (Addendum)
Continue taking Eliquis 5mg , Metoprolol tartrate 25mg  Unable to get definitive EKG in office due to patient inability to hold still Drew labs for chronic fatigue Wants to see a local cardiologist instead of [place] Will reach out if labs are normal and continued fatigue Will suggest getting a reading from implanted recorder if labs come back normal

## 2022-12-27 NOTE — Assessment & Plan Note (Signed)
Labs drawn Will adjust treatment depending on labs Possible referral to local cardiology due to fatigue and weakness along with heart history.

## 2022-12-28 LAB — CBC WITH DIFFERENTIAL/PLATELET
Basophils Absolute: 0.1 10*3/uL (ref 0.0–0.2)
Basos: 1 %
EOS (ABSOLUTE): 0.3 10*3/uL (ref 0.0–0.4)
Eos: 5 %
Hematocrit: 34.1 % — ABNORMAL LOW (ref 37.5–51.0)
Hemoglobin: 10.5 g/dL — ABNORMAL LOW (ref 13.0–17.7)
Immature Grans (Abs): 0 10*3/uL (ref 0.0–0.1)
Immature Granulocytes: 0 %
Lymphocytes Absolute: 1 10*3/uL (ref 0.7–3.1)
Lymphs: 18 %
MCH: 27 pg (ref 26.6–33.0)
MCHC: 30.8 g/dL — ABNORMAL LOW (ref 31.5–35.7)
MCV: 88 fL (ref 79–97)
Monocytes Absolute: 0.7 10*3/uL (ref 0.1–0.9)
Monocytes: 13 %
Neutrophils Absolute: 3.7 10*3/uL (ref 1.4–7.0)
Neutrophils: 63 %
Platelets: 291 10*3/uL (ref 150–450)
RBC: 3.89 x10E6/uL — ABNORMAL LOW (ref 4.14–5.80)
RDW: 14.6 % (ref 11.6–15.4)
WBC: 5.9 10*3/uL (ref 3.4–10.8)

## 2022-12-28 LAB — B12 AND FOLATE PANEL
Folate: 7.2 ng/mL (ref >3.0)
Vitamin B-12: 447 pg/mL (ref 232–1245)

## 2022-12-28 LAB — COMPREHENSIVE METABOLIC PANEL
ALT: 17 [IU]/L (ref 0–44)
AST: 22 [IU]/L (ref 0–40)
Albumin: 4 g/dL (ref 3.8–4.8)
Alkaline Phosphatase: 87 [IU]/L (ref 44–121)
BUN/Creatinine Ratio: 21 (ref 10–24)
BUN: 21 mg/dL (ref 8–27)
Bilirubin Total: 0.6 mg/dL (ref 0.0–1.2)
CO2: 25 mmol/L (ref 20–29)
Calcium: 9.3 mg/dL (ref 8.6–10.2)
Chloride: 103 mmol/L (ref 96–106)
Creatinine, Ser: 1.01 mg/dL (ref 0.76–1.27)
Globulin, Total: 2.3 g/dL (ref 1.5–4.5)
Glucose: 90 mg/dL (ref 70–99)
Potassium: 5.2 mmol/L (ref 3.5–5.2)
Sodium: 142 mmol/L (ref 134–144)
Total Protein: 6.3 g/dL (ref 6.0–8.5)
eGFR: 77 mL/min/{1.73_m2} (ref >59)

## 2022-12-28 LAB — LIPID PANEL
Chol/HDL Ratio: 2.3 ratio (ref 0.0–5.0)
Cholesterol, Total: 103 mg/dL (ref 100–199)
HDL: 44 mg/dL (ref >39)
LDL Chol Calc (NIH): 45 mg/dL (ref 0–99)
Triglycerides: 67 mg/dL (ref 0–149)
VLDL Cholesterol Cal: 14 mg/dL (ref 5–40)

## 2022-12-28 LAB — T4, FREE: Free T4: 1.7 ng/dL (ref 0.82–1.77)

## 2022-12-28 LAB — TSH: TSH: 0.601 u[IU]/mL (ref 0.450–4.500)

## 2022-12-28 LAB — HEMOGLOBIN A1C
Est. average glucose Bld gHb Est-mCnc: 128 mg/dL
Hgb A1c MFr Bld: 6.1 % — ABNORMAL HIGH (ref 4.8–5.6)

## 2023-01-02 ENCOUNTER — Telehealth: Payer: Self-pay

## 2023-01-02 NOTE — Telephone Encounter (Signed)
Received call from care giver stating that patient has had black stools since Friday and feels very tired with no energy.  Recent labs indicate low HGB & HCT.  Patient doesn't want to go to ED.  Per Huston Foley patient needs to go to ED, caregiver agreed and will take him.

## 2023-02-15 DEATH — deceased
